# Patient Record
Sex: Female | Born: 1985
Health system: Southern US, Community
[De-identification: ages and names within clinical notes are randomized; demographics above are authoritative.]

## PROBLEM LIST (undated history)

## (undated) DIAGNOSIS — Z87442 Personal history of urinary calculi: Secondary | ICD-10-CM

## (undated) DIAGNOSIS — F419 Anxiety disorder, unspecified: Secondary | ICD-10-CM

## (undated) DIAGNOSIS — T7840XA Allergy, unspecified, initial encounter: Secondary | ICD-10-CM

## (undated) DIAGNOSIS — N289 Disorder of kidney and ureter, unspecified: Secondary | ICD-10-CM

## (undated) DIAGNOSIS — J189 Pneumonia, unspecified organism: Secondary | ICD-10-CM

## (undated) DIAGNOSIS — R519 Headache, unspecified: Secondary | ICD-10-CM

## (undated) DIAGNOSIS — F32A Depression, unspecified: Secondary | ICD-10-CM

## (undated) DIAGNOSIS — E669 Obesity, unspecified: Secondary | ICD-10-CM

## (undated) DIAGNOSIS — F3181 Bipolar II disorder: Secondary | ICD-10-CM

## (undated) DIAGNOSIS — R55 Syncope and collapse: Secondary | ICD-10-CM

## (undated) DIAGNOSIS — F329 Major depressive disorder, single episode, unspecified: Secondary | ICD-10-CM

## (undated) DIAGNOSIS — G709 Myoneural disorder, unspecified: Secondary | ICD-10-CM

## (undated) DIAGNOSIS — N200 Calculus of kidney: Secondary | ICD-10-CM

## (undated) DIAGNOSIS — J45909 Unspecified asthma, uncomplicated: Secondary | ICD-10-CM

## (undated) DIAGNOSIS — F909 Attention-deficit hyperactivity disorder, unspecified type: Secondary | ICD-10-CM

## (undated) DIAGNOSIS — K589 Irritable bowel syndrome without diarrhea: Secondary | ICD-10-CM

## (undated) DIAGNOSIS — K219 Gastro-esophageal reflux disease without esophagitis: Secondary | ICD-10-CM

## (undated) HISTORY — PX: WISDOM TOOTH EXTRACTION: SHX21

## (undated) HISTORY — DX: Unspecified asthma, uncomplicated: J45.909

## (undated) HISTORY — DX: Obesity, unspecified: E66.9

## (undated) HISTORY — DX: Syncope and collapse: R55

## (undated) HISTORY — DX: Irritable bowel syndrome, unspecified: K58.9

## (undated) HISTORY — DX: Allergy, unspecified, initial encounter: T78.40XA

## (undated) HISTORY — DX: Myoneural disorder, unspecified: G70.9

## (undated) HISTORY — DX: Calculus of kidney: N20.0

## (undated) HISTORY — DX: Bipolar II disorder: F31.81

---
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
## 2005-04-28 ENCOUNTER — Other Ambulatory Visit: Admission: RE | Admit: 2005-04-28 | Discharge: 2005-04-28 | Payer: Self-pay | Admitting: Obstetrics and Gynecology

## 2005-11-02 ENCOUNTER — Inpatient Hospital Stay (HOSPITAL_COMMUNITY): Admission: AD | Admit: 2005-11-02 | Discharge: 2005-11-03 | Payer: Self-pay | Admitting: Obstetrics and Gynecology

## 2016-11-17 ENCOUNTER — Emergency Department (HOSPITAL_COMMUNITY): Payer: BLUE CROSS/BLUE SHIELD

## 2016-11-17 ENCOUNTER — Encounter (HOSPITAL_COMMUNITY): Payer: Self-pay | Admitting: Emergency Medicine

## 2016-11-17 ENCOUNTER — Emergency Department (HOSPITAL_COMMUNITY)
Admission: EM | Admit: 2016-11-17 | Discharge: 2016-11-17 | Disposition: A | Discharge status: Home / Self Care | Payer: BLUE CROSS/BLUE SHIELD | Attending: Emergency Medicine | Admitting: Emergency Medicine

## 2016-11-17 DIAGNOSIS — N200 Calculus of kidney: Secondary | ICD-10-CM | POA: Diagnosis not present

## 2016-11-17 DIAGNOSIS — Z79899 Other long term (current) drug therapy: Secondary | ICD-10-CM | POA: Diagnosis not present

## 2016-11-17 DIAGNOSIS — M545 Low back pain: Secondary | ICD-10-CM | POA: Diagnosis present

## 2016-11-17 LAB — URINALYSIS, ROUTINE W REFLEX MICROSCOPIC
BILIRUBIN URINE: NEGATIVE
GLUCOSE, UA: NEGATIVE mg/dL
KETONES UR: 5 mg/dL — AB
Nitrite: NEGATIVE
PH: 5 (ref 5.0–8.0)
Protein, ur: NEGATIVE mg/dL
SPECIFIC GRAVITY, URINE: 1.019 (ref 1.005–1.030)

## 2016-11-17 LAB — WET PREP, GENITAL
CLUE CELLS WET PREP: NONE SEEN
Sperm: NONE SEEN
TRICH WET PREP: NONE SEEN
YEAST WET PREP: NONE SEEN

## 2016-11-17 LAB — RPR: RPR Ser Ql: NONREACTIVE

## 2016-11-17 LAB — I-STAT BETA HCG BLOOD, ED (MC, WL, AP ONLY)

## 2016-11-17 LAB — GC/CHLAMYDIA PROBE AMP (~~LOC~~) NOT AT ARMC
CHLAMYDIA, DNA PROBE: NEGATIVE
NEISSERIA GONORRHEA: NEGATIVE

## 2016-11-17 LAB — HIV ANTIBODY (ROUTINE TESTING W REFLEX): HIV Screen 4th Generation wRfx: NONREACTIVE

## 2016-11-17 IMAGING — CT CT RENAL STONE PROTOCOL
2 of 4 series · 17 of 46 positions shown, 19 images · non-contrast
Comparison: None.

CLINICAL DATA: Right flank pain for several days. Recent UTI
treated with antibiotics.

EXAM:
CT ABDOMEN AND PELVIS WITHOUT CONTRAST
TECHNIQUE: Multidetector CT imaging of the abdomen and pelvis was performed
following the standard protocol without IV contrast.

[Series 3: renal stone 5.0 · axial · 0.75mm/px · z∈[+928,+1348]mm · 14 of 92 slices shown, 16 images]
[im 4/92  soft-tissue]
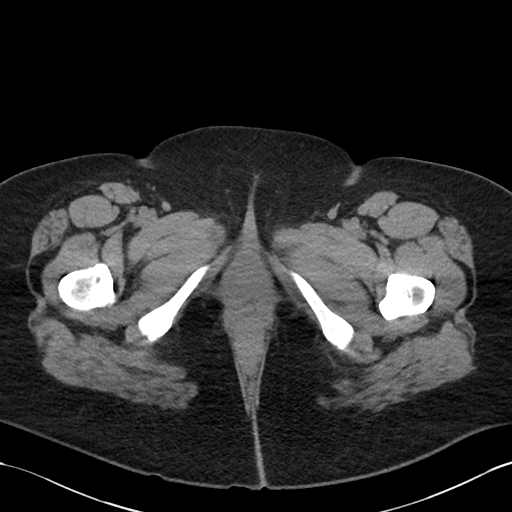
[im 4/92  bone]
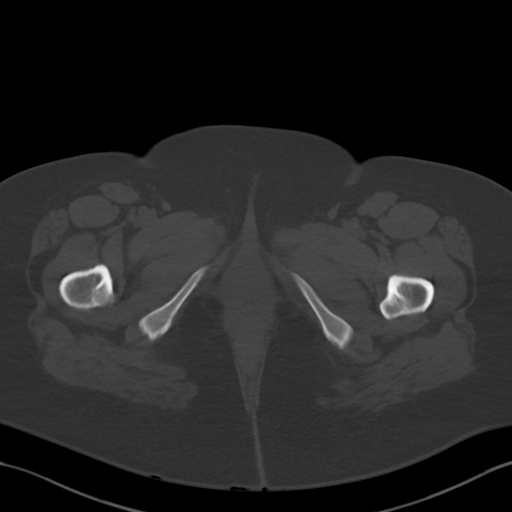
[im 12/92  soft-tissue]
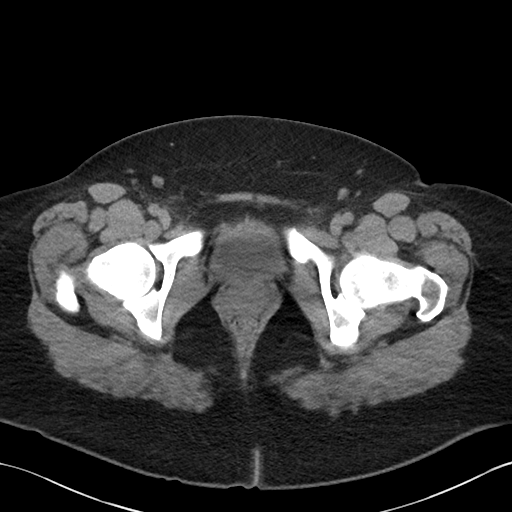
[im 19/92  soft-tissue]
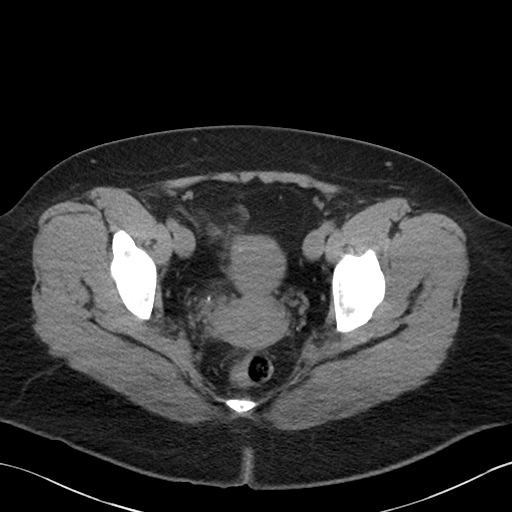
[im 23/92  soft-tissue]
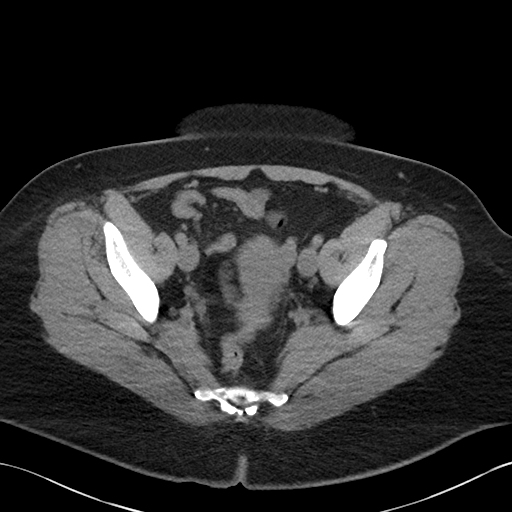
[im 31/92  soft-tissue]
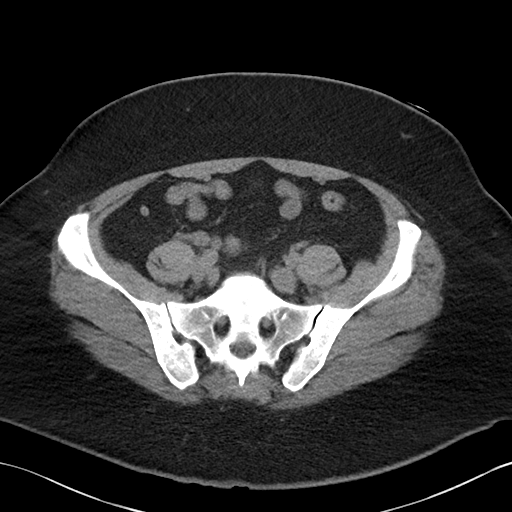
[im 38/92  soft-tissue]
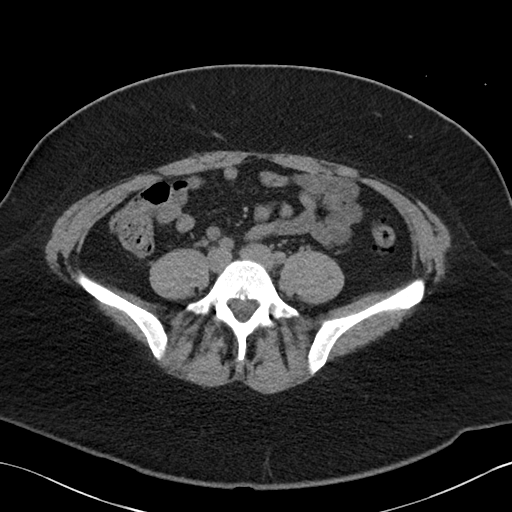
[im 42/92  soft-tissue]
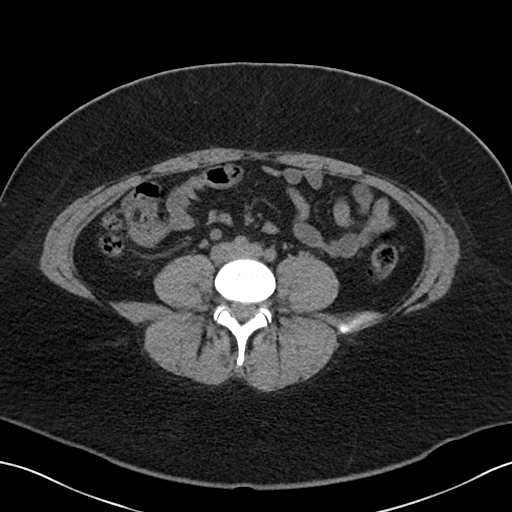
[im 50/92  soft-tissue]
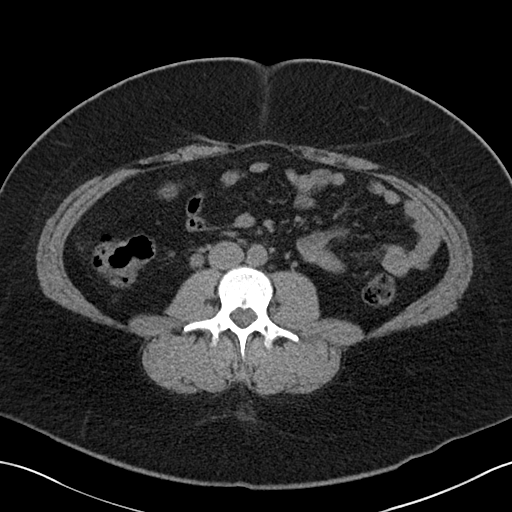
[im 54/92  soft-tissue]
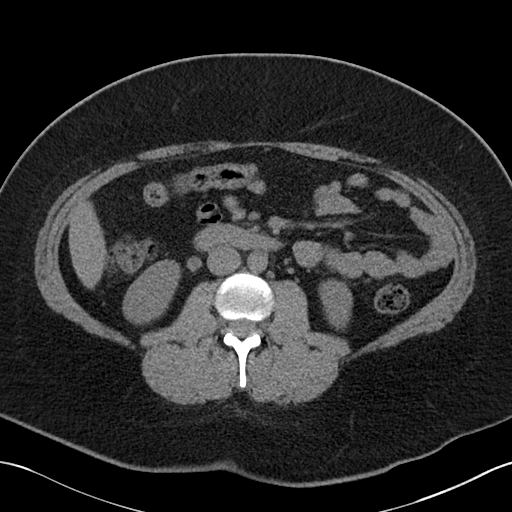
[im 54/92  bone]
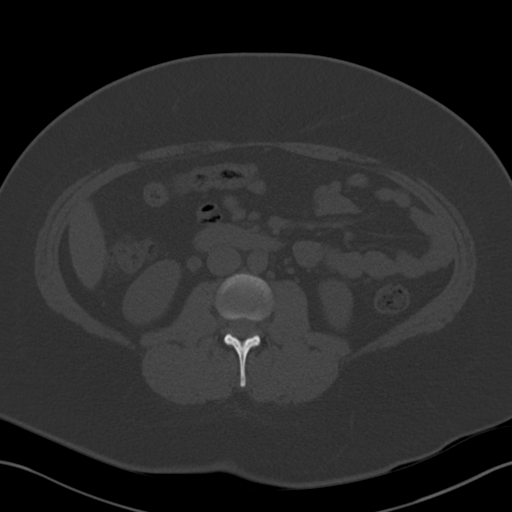
[im 61/92  soft-tissue]
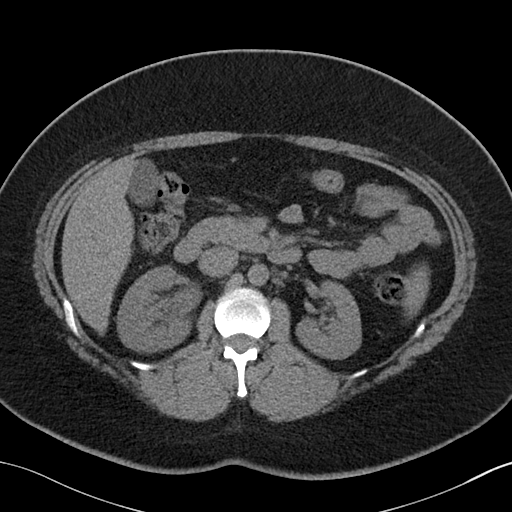
[im 69/92  soft-tissue]
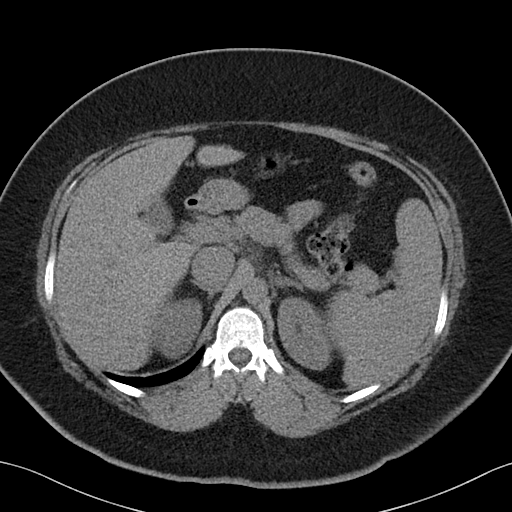
[im 73/92  soft-tissue]
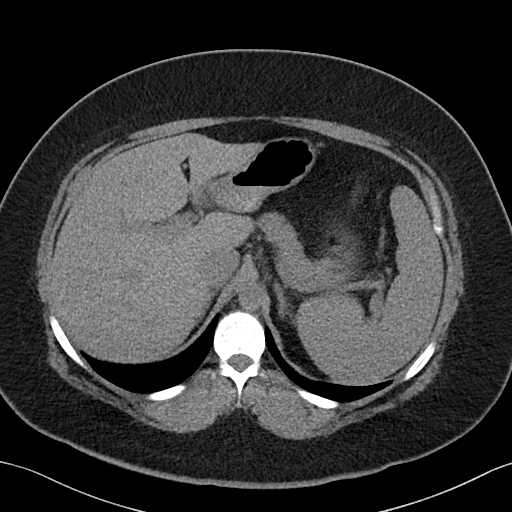
[im 80/92  soft-tissue]
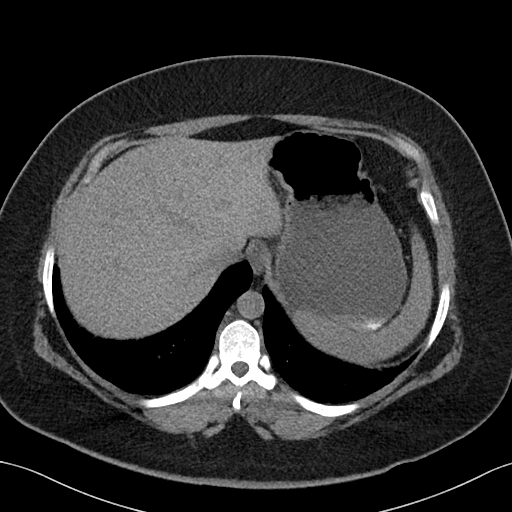
[im 88/92  soft-tissue]
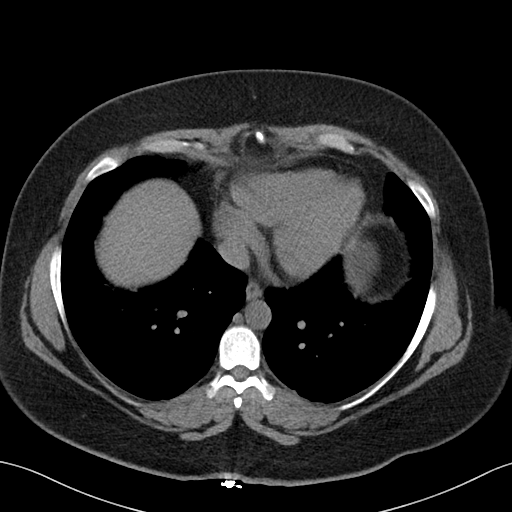

[Series 7: renal stone 3.0 cor · coronal · 0.84mm/px · 3 of 101 slices shown]
[im 34/101  soft-tissue]
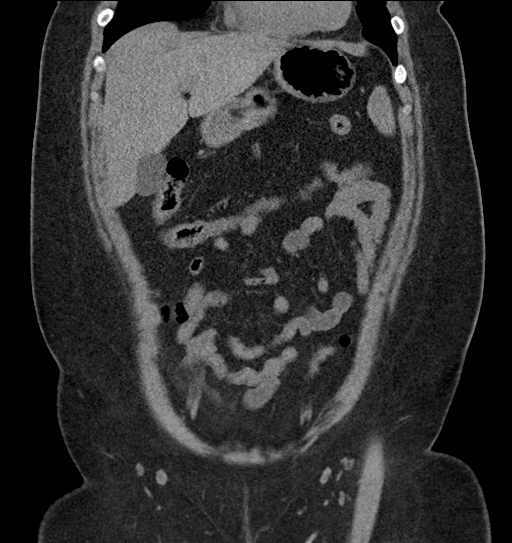
[im 45/101  soft-tissue]
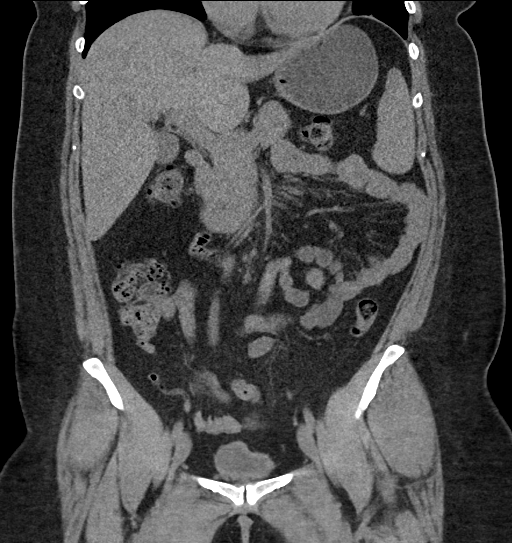
[im 56/101  soft-tissue]
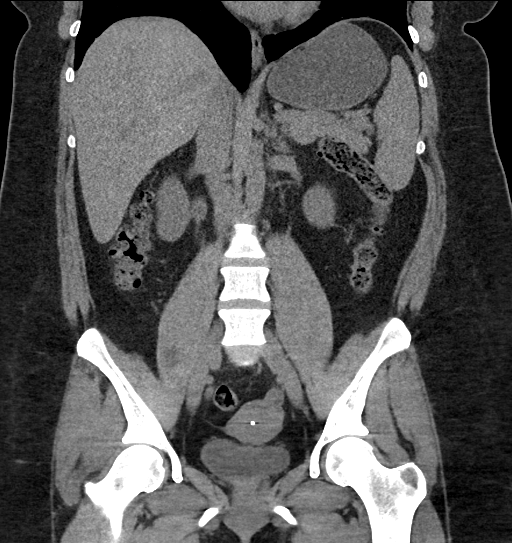

[17 of 46 positions shown; findings below may reference images not displayed]

FINDINGS: Lower chest:  Negative

Hepatobiliary: No focal liver abnormality.No evidence of biliary
obstruction or stone.

Pancreas: Unremarkable.

Spleen: Unremarkable.

Adrenals/Urinary Tract:  Negative adrenals.

5 x 4 mm stone at the right ureteral vesicular junction with
moderate hydroureteronephrosis. Mild right renal expansion and
low-density cortex. No additional urolithiasis. No left
hydronephrosis.

Unremarkable bladder.

Stomach/Bowel:  No obstruction. No appendicitis.

Vascular/Lymphatic: No acute vascular abnormality. No mass or
adenopathy.

Reproductive:No pathologic findings.  IUD in good position.

Other: No ascites or pneumoperitoneum.

Musculoskeletal: No acute abnormalities.
IMPRESSION: 5 x 4 mm right UVJ calculus with moderate hydroureteronephrosis.

## 2016-11-17 MED ORDER — MORPHINE SULFATE (PF) 4 MG/ML IV SOLN
4.0000 mg | Freq: Once | INTRAVENOUS | Status: AC
  Administered 2016-11-17: 4 mg via INTRAVENOUS
  Filled 2016-11-17: qty 1

## 2016-11-17 MED ORDER — OXYCODONE HCL 5 MG PO TABS
5.0000 mg | ORAL_TABLET | Freq: Once | ORAL | Status: AC
  Administered 2016-11-17: 5 mg via ORAL
  Filled 2016-11-17: qty 1

## 2016-11-17 MED ORDER — SODIUM CHLORIDE 0.9 % IV BOLUS (SEPSIS)
1000.0000 mL | Freq: Once | INTRAVENOUS | Status: AC
  Administered 2016-11-17: 1000 mL via INTRAVENOUS

## 2016-11-17 MED ORDER — DEXTROSE 5 % IV SOLN
1.0000 g | Freq: Once | INTRAVENOUS | Status: AC
  Administered 2016-11-17: 1 g via INTRAVENOUS
  Filled 2016-11-17: qty 10

## 2016-11-17 MED ORDER — ONDANSETRON HCL 4 MG/2ML IJ SOLN
4.0000 mg | Freq: Once | INTRAMUSCULAR | Status: AC
  Administered 2016-11-17: 4 mg via INTRAVENOUS
  Filled 2016-11-17: qty 2

## 2016-11-17 MED ORDER — TAMSULOSIN HCL 0.4 MG PO CAPS: 0.4000 mg | ORAL_CAPSULE | Freq: Every day | ORAL | 0 refills | Status: DC

## 2016-11-17 MED ORDER — MORPHINE SULFATE 15 MG PO TABS: 15.0000 mg | ORAL_TABLET | ORAL | 0 refills | Status: DC | PRN

## 2016-11-17 MED ORDER — MORPHINE SULFATE (PF) 4 MG/ML IV SOLN
8.0000 mg | Freq: Once | INTRAVENOUS | Status: AC
  Administered 2016-11-17: 8 mg via INTRAVENOUS
  Filled 2016-11-17: qty 2

## 2016-11-17 MED ORDER — AZITHROMYCIN 250 MG PO TABS
1000.0000 mg | ORAL_TABLET | Freq: Once | ORAL | Status: AC
  Administered 2016-11-17: 1000 mg via ORAL
  Filled 2016-11-17: qty 4

## 2016-11-17 MED ORDER — ACETAMINOPHEN 500 MG PO TABS
1000.0000 mg | ORAL_TABLET | Freq: Once | ORAL | Status: AC
  Administered 2016-11-17: 1000 mg via ORAL
  Filled 2016-11-17: qty 2

## 2016-11-17 MED ORDER — KETOROLAC TROMETHAMINE 30 MG/ML IJ SOLN
30.0000 mg | Freq: Once | INTRAMUSCULAR | Status: AC
  Administered 2016-11-17: 30 mg via INTRAVENOUS
  Filled 2016-11-17: qty 1

## 2016-11-17 MED ORDER — ONDANSETRON 4 MG PO TBDP: 4.0000 mg | ORAL_TABLET | Freq: Three times a day (TID) | ORAL | 0 refills | Status: DC | PRN

## 2016-11-17 NOTE — ED Triage Notes (Signed)
Pt here from home with UTI , pt has been antibiotics for a week but has not got better . Pt has pain radiating to her back

## 2016-11-17 NOTE — Discharge Instructions (Signed)

## 2016-11-17 NOTE — ED Notes (Signed)
Patient transported to CT 

## 2016-11-17 NOTE — ED Provider Notes (Signed)
[facility]-EMERGENCY DEPT Provider Note   CSN: [id number] Arrival date & time: 11/17/16  4098     History   Chief Complaint Chief Complaint  Patient presents with  . Recurrent UTI    HPI [name] [name] is a 31 y.o. female.  31 yo F with a chief complaint of urinary tract like symptoms. Patient having some hesitancy and urgency. Going on for the past week. Started on an unknown antibiotic about a week ago. Patient having no improvement to this point. Now having some right lower back pain with it as well. Denies fevers denies nausea. Denies abdominal pain. Having some vaginal bleeding denies discharge.   The history is provided by the patient.  Illness  This is a recurrent problem. The current episode started more than 1 week ago. The problem occurs constantly. The problem has not changed since onset.Pertinent negatives include no chest pain, no headaches and no shortness of breath. Nothing aggravates the symptoms. Nothing relieves the symptoms. She has tried nothing for the symptoms. The treatment provided no relief.    History reviewed. No pertinent past medical history.  There are no active problems to display for this patient.   History reviewed. No pertinent surgical history.  OB History    No data available       Home Medications    Prior to Admission medications   Medication Sig Start Date End Date Taking? Authorizing Provider  cholecalciferol (VITAMIN D) 1000 units tablet Take 1,000 Units by mouth at bedtime.   Yes Historical Provider, MD  clonazePAM (KLONOPIN) 0.5 MG tablet Take 0.5 mg by mouth 2 (two) times daily as needed for anxiety.   Yes Historical Provider, MD  cyanocobalamin 500 MCG tablet Take 500 mcg by mouth at bedtime.   Yes Historical Provider, MD  escitalopram (LEXAPRO) 5 MG tablet Take 5 mg by mouth at bedtime.    Yes Historical Provider, MD  fluticasone (FLONASE) 50 MCG/ACT nasal spray Place 1 spray into both nostrils at bedtime.   Yes Historical  Provider, MD  levonorgestrel (MIRENA) 20 MCG/24HR IUD 1 each by Intrauterine route once.   Yes Historical Provider, MD  loratadine (CLARITIN) 10 MG tablet Take 10 mg by mouth at bedtime.   Yes Historical Provider, MD  morphine (MSIR) 15 MG tablet Take 1 tablet (15 mg total) by mouth every 4 (four) hours as needed for severe pain. 11/17/16   Melene Plan, DO  ondansetron (ZOFRAN ODT) 4 MG disintegrating tablet Take 1 tablet (4 mg total) by mouth every 8 (eight) hours as needed for nausea or vomiting. 11/17/16   Melene Plan, DO  tamsulosin (FLOMAX) 0.4 MG CAPS capsule Take 1 capsule (0.4 mg total) by mouth daily after supper. 11/17/16   Melene Plan, DO    Family History History reviewed. No pertinent family history.  Social History Social History  Substance Use Topics  . Smoking status: Never Smoker  . Smokeless tobacco: Never Used  . Alcohol use Yes     Comment: social      Allergies   Wellbutrin [bupropion]   Review of Systems Review of Systems  Constitutional: Negative for chills and fever.  HENT: Negative for congestion and rhinorrhea.   Eyes: Negative for redness and visual disturbance.  Respiratory: Negative for shortness of breath and wheezing.   Cardiovascular: Negative for chest pain and palpitations.  Gastrointestinal: Negative for nausea and vomiting.  Genitourinary: Positive for frequency and urgency. Negative for dysuria.  Musculoskeletal: Negative for arthralgias and myalgias.  Skin: Negative  for pallor and wound.  Neurological: Negative for dizziness and headaches.     Physical Exam Updated Vital Signs BP 121/68   Pulse 66   Temp 97.8 F (36.6 C) (Oral)   Resp 16   Ht  (1.626 m)   Wt 265 lb (120.2 kg)   SpO2 97%   BMI 45.49 kg/m   Physical Exam  Constitutional: She is oriented to person, place, and time. She appears well-developed and well-nourished. No distress.  HENT:  Head: Normocephalic and atraumatic.  Eyes: EOM are normal. Pupils are equal,  round, and reactive to light.  Neck: Normal range of motion. Neck supple.  Cardiovascular: Normal rate and regular rhythm.  Exam reveals no gallop and no friction rub.   No murmur heard. Pulmonary/Chest: Effort normal. She has no wheezes. She has no rales.  Abdominal: Soft. She exhibits no distension and no mass. There is no tenderness. There is no guarding.  Genitourinary: Cervix exhibits motion tenderness (very mild), discharge (purulent) and friability. Right adnexum displays no mass, no tenderness and no fullness. Left adnexum displays no mass, no tenderness and no fullness.  Musculoskeletal: She exhibits no edema or tenderness.  Neurological: She is alert and oriented to person, place, and time.  Skin: Skin is warm and dry. She is not diaphoretic.  Psychiatric: She has a normal mood and affect. Her behavior is normal.  Nursing note and vitals reviewed.    ED Treatments / Results  Labs (all labs ordered are listed, but only abnormal results are displayed) Labs Reviewed  WET PREP, GENITAL - Abnormal; Notable for the following:       Result Value   WBC, Wet Prep HPF POC MANY (*)    All other components within normal limits  URINALYSIS, ROUTINE W REFLEX MICROSCOPIC - Abnormal; Notable for the following:    Color, Urine AMBER (*)    APPearance HAZY (*)    Hgb urine dipstick SMALL (*)    Ketones, ur 5 (*)    Leukocytes, UA SMALL (*)    Bacteria, UA RARE (*)    Squamous Epithelial / LPF 0-5 (*)    All other components within normal limits  URINE CULTURE  RPR  HIV ANTIBODY (ROUTINE TESTING)  I-STAT BETA HCG BLOOD, ED ([facility], [facility], [facility] ONLY)  GC/CHLAMYDIA PROBE AMP ([place]) NOT AT [facility]    EKG  EKG Interpretation None       Radiology Ct Renal Stone Study  Result Date: 11/17/2016 CLINICAL DATA:  Right flank pain for several days. Recent UTI treated with antibiotics. EXAM: CT ABDOMEN AND PELVIS WITHOUT CONTRAST TECHNIQUE: Multidetector CT imaging of the abdomen and pelvis was  performed following the standard protocol without IV contrast. COMPARISON:  None. FINDINGS: Lower chest:  Negative Hepatobiliary: No focal liver abnormality.No evidence of biliary obstruction or stone. Pancreas: Unremarkable. Spleen: Unremarkable. Adrenals/Urinary Tract:  Negative adrenals. 5 x 4 mm stone at the right ureteral vesicular junction with moderate hydroureteronephrosis. Mild right renal expansion and low-density cortex. No additional urolithiasis. No left hydronephrosis. Unremarkable bladder. Stomach/Bowel:  No obstruction. No appendicitis. Vascular/Lymphatic: No acute vascular abnormality. No mass or adenopathy. Reproductive:No pathologic findings.  IUD in good position. Other: No ascites or pneumoperitoneum. Musculoskeletal: No acute abnormalities. IMPRESSION: 5 x 4 mm right UVJ calculus with moderate hydroureteronephrosis. Electronically Signed   By: Marnee Spring M.D.   On: 11/17/2016 09:55    Procedures Procedures (including critical care time)  Medications Ordered in ED Medications  acetaminophen (TYLENOL) tablet 1,000 mg (  1,000 mg Oral Given 11/17/16 0839)  oxyCODONE (Oxy IR/ROXICODONE) immediate release tablet 5 mg (5 mg Oral Given 11/17/16 0839)  morphine 4 MG/ML injection 4 mg (4 mg Intravenous Given 11/17/16 0904)  sodium chloride 0.9 % bolus 1,000 mL (0 mLs Intravenous Stopped 11/17/16 1113)  cefTRIAXone (ROCEPHIN) 1 g in dextrose 5 % 50 mL IVPB (0 g Intravenous Stopped 11/17/16 1008)  azithromycin (ZITHROMAX) tablet 1,000 mg (1,000 mg Oral Given 11/17/16 0905)  ketorolac (TORADOL) 30 MG/ML injection 30 mg (30 mg Intravenous Given 11/17/16 1025)  morphine 4 MG/ML injection 8 mg (8 mg Intravenous Given 11/17/16 1025)  ondansetron (ZOFRAN) injection 4 mg (4 mg Intravenous Given 11/17/16 1025)     Initial Impression / Assessment and Plan / ED Course  I have reviewed the triage vital signs and the nursing notes.  Pertinent labs & imaging results that were available during my care  of the patient were reviewed by me and considered in my medical decision making (see chart for details).     32 yo F with a chief complaint of urinary tract symptoms. Going on for the past week. Recently treated with no improvement. Will likely need escalation of antibiotics will check a UA. If this is negative will likely need a pelvic as well to rule out other etiologies.  Pelvic with mild cmt.  Signs of cervicitis. Treat with abx.  The patient had sudden worsening of her symptoms over the past 10 minutes or so. Appears to be in some discomfort. Unable to reproduce with palpation. Still on the right lower back region. No radiation. We'll obtain a CT stone study to further evaluate.  CT with 5mm kidney stone at R UVJ.  Pain controlled.  D/c home.    I have discussed the diagnosis/risks/treatment options with the patient and family and believe the pt to be eligible for discharge home to follow-up with Urology. We also discussed returning to the ED immediately if new or worsening sx occur. We discussed the sx which are most concerning (e.g., sudden worsening pain, fever, inability to tolerate by mouth) that necessitate immediate return. Medications administered to the patient during their visit and any new prescriptions provided to the patient are listed below.  Medications given during this visit Medications  acetaminophen (TYLENOL) tablet 1,000 mg (1,000 mg Oral Given 11/17/16 0839)  oxyCODONE (Oxy IR/ROXICODONE) immediate release tablet 5 mg (5 mg Oral Given 11/17/16 0839)  morphine 4 MG/ML injection 4 mg (4 mg Intravenous Given 11/17/16 0904)  sodium chloride 0.9 % bolus 1,000 mL (0 mLs Intravenous Stopped 11/17/16 1113)  cefTRIAXone (ROCEPHIN) 1 g in dextrose 5 % 50 mL IVPB (0 g Intravenous Stopped 11/17/16 1008)  azithromycin (ZITHROMAX) tablet 1,000 mg (1,000 mg Oral Given 11/17/16 0905)  ketorolac (TORADOL) 30 MG/ML injection 30 mg (30 mg Intravenous Given 11/17/16 1025)  morphine 4 MG/ML  injection 8 mg (8 mg Intravenous Given 11/17/16 1025)  ondansetron (ZOFRAN) injection 4 mg (4 mg Intravenous Given 11/17/16 1025)     The patient appears reasonably screen and/or stabilized for discharge and I doubt any other medical condition or other [facility] requiring further screening, evaluation, or treatment in the ED at this time prior to discharge.    Final Clinical Impressions(s) / ED Diagnoses   Final diagnoses:  Nephrolithiasis    New Prescriptions Discharge Medication List as of 11/17/2016 11:41 AM    START taking these medications   Details  morphine (MSIR) 15 MG tablet Take 1 tablet (15 mg total) by  mouth every 4 (four) hours as needed for severe pain., Starting Mon 11/17/2016, Print    ondansetron (ZOFRAN ODT) 4 MG disintegrating tablet Take 1 tablet (4 mg total) by mouth every 8 (eight) hours as needed for nausea or vomiting., Starting Mon 11/17/2016, Print    tamsulosin (FLOMAX) 0.4 MG CAPS capsule Take 1 capsule (0.4 mg total) by mouth daily after supper., Starting Mon 11/17/2016, Print         Melene Plan, DO 11/17/16 2015

## 2016-11-18 LAB — URINE CULTURE

## 2016-12-10 ENCOUNTER — Other Ambulatory Visit: Payer: Self-pay | Admitting: Urology

## 2016-12-10 ENCOUNTER — Ambulatory Visit (HOSPITAL_COMMUNITY): Payer: BLUE CROSS/BLUE SHIELD

## 2016-12-10 ENCOUNTER — Ambulatory Visit (HOSPITAL_COMMUNITY): Payer: BLUE CROSS/BLUE SHIELD | Admitting: Anesthesiology

## 2016-12-10 ENCOUNTER — Encounter (HOSPITAL_COMMUNITY): Payer: Self-pay | Admitting: *Deleted

## 2016-12-10 ENCOUNTER — Encounter (HOSPITAL_COMMUNITY)
Admission: RE | Disposition: A | Discharge status: Home / Self Care | Payer: Self-pay | Source: Ambulatory Visit | Attending: Urology

## 2016-12-10 ENCOUNTER — Ambulatory Visit (HOSPITAL_COMMUNITY)
Admission: RE | Admit: 2016-12-10 | Discharge: 2016-12-10 | Disposition: A | Discharge status: Home / Self Care | Payer: BLUE CROSS/BLUE SHIELD | Source: Ambulatory Visit | Attending: Urology | Admitting: Urology

## 2016-12-10 DIAGNOSIS — F329 Major depressive disorder, single episode, unspecified: Secondary | ICD-10-CM | POA: Insufficient documentation

## 2016-12-10 DIAGNOSIS — F419 Anxiety disorder, unspecified: Secondary | ICD-10-CM | POA: Diagnosis not present

## 2016-12-10 DIAGNOSIS — N201 Calculus of ureter: Secondary | ICD-10-CM | POA: Insufficient documentation

## 2016-12-10 DIAGNOSIS — Z6841 Body Mass Index (BMI) 40.0 and over, adult: Secondary | ICD-10-CM | POA: Insufficient documentation

## 2016-12-10 DIAGNOSIS — Z79891 Long term (current) use of opiate analgesic: Secondary | ICD-10-CM | POA: Insufficient documentation

## 2016-12-10 DIAGNOSIS — Z791 Long term (current) use of non-steroidal anti-inflammatories (NSAID): Secondary | ICD-10-CM | POA: Insufficient documentation

## 2016-12-10 DIAGNOSIS — K219 Gastro-esophageal reflux disease without esophagitis: Secondary | ICD-10-CM | POA: Insufficient documentation

## 2016-12-10 HISTORY — PX: CYSTOSCOPY WITH RETROGRADE PYELOGRAM, URETEROSCOPY AND STENT PLACEMENT: SHX5789

## 2016-12-10 HISTORY — DX: Anxiety disorder, unspecified: F41.9

## 2016-12-10 HISTORY — DX: Gastro-esophageal reflux disease without esophagitis: K21.9

## 2016-12-10 HISTORY — DX: Personal history of urinary calculi: Z87.442

## 2016-12-10 HISTORY — DX: Major depressive disorder, single episode, unspecified: F32.9

## 2016-12-10 HISTORY — DX: Depression, unspecified: F32.A

## 2016-12-10 LAB — BASIC METABOLIC PANEL
Anion gap: 8 (ref 5–15)
BUN: 14 mg/dL (ref 6–20)
CHLORIDE: 106 mmol/L (ref 101–111)
CO2: 24 mmol/L (ref 22–32)
Calcium: 9.1 mg/dL (ref 8.9–10.3)
Creatinine, Ser: 0.97 mg/dL (ref 0.44–1.00)
GFR calc Af Amer: >60 mL/min (ref >60)
GFR calc non Af Amer: >60 mL/min (ref >60)
Glucose, Bld: 93 mg/dL (ref 65–99)
POTASSIUM: 3.8 mmol/L (ref 3.5–5.1)
SODIUM: 138 mmol/L (ref 135–145)

## 2016-12-10 LAB — HCG, SERUM, QUALITATIVE: Preg, Serum: NEGATIVE

## 2016-12-10 SURGERY — CYSTOURETEROSCOPY, WITH RETROGRADE PYELOGRAM AND STENT INSERTION
Anesthesia: General | Site: Ureter | Laterality: Right

## 2016-12-10 MED ORDER — ONDANSETRON HCL 4 MG/2ML IJ SOLN
INTRAMUSCULAR | Status: AC
  Filled 2016-12-10: qty 2

## 2016-12-10 MED ORDER — HYDROCODONE-ACETAMINOPHEN 5-325 MG PO TABS: 1.0000 | ORAL_TABLET | Freq: Four times a day (QID) | ORAL | 0 refills | Status: DC | PRN

## 2016-12-10 MED ORDER — FENTANYL CITRATE (PF) 100 MCG/2ML IJ SOLN
INTRAMUSCULAR | Status: AC
  Filled 2016-12-10: qty 2

## 2016-12-10 MED ORDER — LACTATED RINGERS IV SOLN: INTRAVENOUS | Status: DC

## 2016-12-10 MED ORDER — PHENAZOPYRIDINE HCL 100 MG PO TABS: 100.0000 mg | ORAL_TABLET | Freq: Three times a day (TID) | ORAL | 0 refills | Status: DC | PRN

## 2016-12-10 MED ORDER — LIDOCAINE 2% (20 MG/ML) 5 ML SYRINGE
INTRAMUSCULAR | Status: DC | PRN
  Administered 2016-12-10: 100 mg via INTRAVENOUS

## 2016-12-10 MED ORDER — MEPERIDINE HCL 50 MG/ML IJ SOLN: 6.2500 mg | INTRAMUSCULAR | Status: DC | PRN

## 2016-12-10 MED ORDER — MIDAZOLAM HCL 2 MG/2ML IJ SOLN
INTRAMUSCULAR | Status: AC
  Filled 2016-12-10: qty 2

## 2016-12-10 MED ORDER — FENTANYL CITRATE (PF) 100 MCG/2ML IJ SOLN
INTRAMUSCULAR | Status: DC | PRN
  Administered 2016-12-10: 50 ug via INTRAVENOUS

## 2016-12-10 MED ORDER — SODIUM CHLORIDE 0.9 % IR SOLN
Status: DC | PRN
  Administered 2016-12-10: 4000 mL

## 2016-12-10 MED ORDER — LIDOCAINE 2% (20 MG/ML) 5 ML SYRINGE
INTRAMUSCULAR | Status: AC
  Filled 2016-12-10: qty 5

## 2016-12-10 MED ORDER — SODIUM CHLORIDE 0.9 % IR SOLN
Status: DC | PRN
  Administered 2016-12-10: 1000 mL

## 2016-12-10 MED ORDER — KETOROLAC TROMETHAMINE 30 MG/ML IJ SOLN
INTRAMUSCULAR | Status: AC
  Filled 2016-12-10: qty 1

## 2016-12-10 MED ORDER — CEFAZOLIN SODIUM-DEXTROSE 2-4 GM/100ML-% IV SOLN
2.0000 g | INTRAVENOUS | Status: AC
  Administered 2016-12-10: 2 g via INTRAVENOUS
  Filled 2016-12-10: qty 100

## 2016-12-10 MED ORDER — ONDANSETRON HCL 4 MG/2ML IJ SOLN
INTRAMUSCULAR | Status: DC | PRN
  Administered 2016-12-10: 4 mg via INTRAVENOUS

## 2016-12-10 MED ORDER — IOHEXOL 300 MG/ML  SOLN
INTRAMUSCULAR | Status: DC | PRN
  Administered 2016-12-10: 3 mL

## 2016-12-10 MED ORDER — GLYCOPYRROLATE 0.2 MG/ML IJ SOLN
INTRAMUSCULAR | Status: DC | PRN
  Administered 2016-12-10: 0.2 mg via INTRAVENOUS

## 2016-12-10 MED ORDER — DEXAMETHASONE SODIUM PHOSPHATE 10 MG/ML IJ SOLN
INTRAMUSCULAR | Status: AC
  Filled 2016-12-10: qty 1

## 2016-12-10 MED ORDER — OXYCODONE HCL 5 MG/5ML PO SOLN
5.0000 mg | Freq: Once | ORAL | Status: DC | PRN
  Filled 2016-12-10: qty 5

## 2016-12-10 MED ORDER — OXYCODONE HCL 5 MG PO TABS: 5.0000 mg | ORAL_TABLET | Freq: Once | ORAL | Status: DC | PRN

## 2016-12-10 MED ORDER — PROMETHAZINE HCL 25 MG/ML IJ SOLN: 6.2500 mg | INTRAMUSCULAR | Status: DC | PRN

## 2016-12-10 MED ORDER — MIDAZOLAM HCL 5 MG/5ML IJ SOLN
INTRAMUSCULAR | Status: DC | PRN
  Administered 2016-12-10: 2 mg via INTRAVENOUS

## 2016-12-10 MED ORDER — HYDROMORPHONE HCL 1 MG/ML IJ SOLN: 0.2500 mg | INTRAMUSCULAR | Status: DC | PRN

## 2016-12-10 MED ORDER — LACTATED RINGERS IV SOLN
INTRAVENOUS | Status: DC
  Administered 2016-12-10: 14:00:00 via INTRAVENOUS

## 2016-12-10 MED ORDER — KETOROLAC TROMETHAMINE 30 MG/ML IJ SOLN
INTRAMUSCULAR | Status: DC | PRN
  Administered 2016-12-10: 30 mg via INTRAVENOUS

## 2016-12-10 MED ORDER — PROPOFOL 10 MG/ML IV BOLUS
INTRAVENOUS | Status: DC | PRN
  Administered 2016-12-10: 200 mg via INTRAVENOUS

## 2016-12-10 MED ORDER — PROPOFOL 10 MG/ML IV BOLUS
INTRAVENOUS | Status: AC
  Filled 2016-12-10: qty 20

## 2016-12-10 MED ORDER — DEXAMETHASONE SODIUM PHOSPHATE 10 MG/ML IJ SOLN
INTRAMUSCULAR | Status: DC | PRN
  Administered 2016-12-10: 10 mg via INTRAVENOUS

## 2016-12-10 SURGICAL SUPPLY — 21 items
BAG URO CATCHER STRL LF (MISCELLANEOUS) ×3 IMPLANT
CATH INTERMIT  6FR 70CM (CATHETERS) IMPLANT
CLOTH BEACON ORANGE TIMEOUT ST (SAFETY) ×3 IMPLANT
COVER SURGICAL LIGHT HANDLE (MISCELLANEOUS) ×3 IMPLANT
EXTRACTOR STONE NITINOL NGAGE (UROLOGICAL SUPPLIES) ×3 IMPLANT
FIBER LASER FLEXIVA 365 (UROLOGICAL SUPPLIES) IMPLANT
FIBER LASER TRAC TIP (UROLOGICAL SUPPLIES) IMPLANT
GLOVE BIOGEL M STRL SZ7.5 (GLOVE) ×3 IMPLANT
GLOVE BIOGEL PI IND STRL 7.5 (GLOVE) ×1 IMPLANT
GLOVE BIOGEL PI INDICATOR 7.5 (GLOVE) ×2
GLOVE SURG SS PI 7.5 STRL IVOR (GLOVE) ×3 IMPLANT
GOWN STRL REUS W/ TWL XL LVL3 (GOWN DISPOSABLE) ×1 IMPLANT
GOWN STRL REUS W/TWL LRG LVL3 (GOWN DISPOSABLE) ×3 IMPLANT
GOWN STRL REUS W/TWL XL LVL3 (GOWN DISPOSABLE) ×2
GUIDEWIRE ANG ZIPWIRE 038X150 (WIRE) ×3 IMPLANT
GUIDEWIRE STR DUAL SENSOR (WIRE) ×3 IMPLANT
MANIFOLD NEPTUNE II (INSTRUMENTS) ×3 IMPLANT
PACK CYSTO (CUSTOM PROCEDURE TRAY) ×3 IMPLANT
SHEATH ACCESS URETERAL 38CM (SHEATH) IMPLANT
TUBING CONNECTING 10 (TUBING) ×2 IMPLANT
TUBING CONNECTING 10' (TUBING) ×1

## 2016-12-10 NOTE — Anesthesia Procedure Notes (Signed)
Procedure Name: LMA Insertion Date/Time: 12/10/2016 4:02 PM Performed by: Leroy LibmanEARDON, [name] Siegman L Patient Re-evaluated:Patient Re-evaluated prior to inductionOxygen Delivery Method: Circle system utilized Preoxygenation: Pre-oxygenation with 100% oxygen Intubation Type: IV induction Ventilation: Mask ventilation without difficulty LMA: LMA inserted LMA Size: 4.0 Number of attempts: 1 Placement Confirmation: positive ETCO2 and breath sounds checked- equal and bilateral Tube secured with: Tape Dental Injury: Teeth and Oropharynx as per pre-operative assessment

## 2016-12-10 NOTE — Discharge Instructions (Addendum)
°. °  General Anesthesia, Adult, Care After °These instructions provide you with information about caring for yourself after your procedure. Your health care provider may also give you more specific instructions. Your treatment has been planned according to current medical practices, but problems sometimes occur. Call your health care provider if you have any problems or questions after your procedure. °What can I expect after the procedure? °After the procedure, it is common to have: °· Vomiting. °· A sore throat. °· Mental slowness. °It is common to feel: °· Nauseous. °· Cold or shivery. °· Sleepy. °· Tired. °· Sore or achy, even in parts of your body where you did not have surgery. °Follow these instructions at home: °For at least 24 hours after the procedure:  °· Do not: °¨ Participate in activities where you could fall or become injured. °¨ Drive. °¨ Use heavy machinery. °¨ Drink alcohol. °¨ Take sleeping pills or medicines that cause drowsiness. °¨ Make important decisions or sign legal documents. °¨ Take care of children on your own. °· Rest. °Eating and drinking  °· If you vomit, drink water, juice, or soup when you can drink without vomiting. °· Drink enough fluid to keep your urine clear or pale yellow. °· Make sure you have little or no nausea before eating solid foods. °· Follow the diet recommended by your health care provider. °General instructions  °· Have a responsible adult stay with you until you are awake and alert. °· Return to your normal activities as told by your health care provider. Ask your health care provider what activities are safe for you. °· Take over-the-counter and prescription medicines only as told by your health care provider. °· If you smoke, do not smoke without supervision. °· Keep all follow-up visits as told by your health care provider. This is important. °Contact a health care provider if: °· You continue to have nausea or vomiting at home, and medicines are not  helpful. °· You cannot drink fluids or start eating again. °· You cannot urinate after 8-12 hours. °· You develop a skin rash. °· You have fever. °· You have increasing redness at the site of your procedure. °Get help right away if: °· You have difficulty breathing. °· You have chest pain. °· You have unexpected bleeding. °· You feel that you are having a life-threatening or urgent problem. °This information is not intended to replace advice given to you by your health care provider. Make sure you discuss any questions you have with your health care provider. °Document Released: 10/27/2000 Document Revised: 12/24/2015 Document Reviewed: 07/05/2015 °Elsevier Interactive Patient Education © 2017 [employer] ° ° ° ° ° ° °1. You may see some blood in the urine and may have some burning with urination for 48-72 hours. You also may notice that you have to urinate more frequently or urgently after your procedure which is normal.  °2. You should call should you develop an inability urinate, fever > 101, persistent nausea and vomiting that prevents you from eating or drinking to stay hydrated.  °

## 2016-12-10 NOTE — Transfer of Care (Signed)
Immediate Anesthesia Transfer of Care Note  Patient: [name] [name]  Procedure(s) Performed: Procedure(s): CYSTOSCOPY WITH RETROGRADE PYELOGRAM, URETEROSCOPY AND STONE REMOVAL (Right)  Patient Location: PACU  Anesthesia Type:General  Level of Consciousness: awake, alert  and oriented  Airway & Oxygen Therapy: Patient Spontanous Breathing and Patient connected to face mask oxygen  Post-op Assessment: Report given to RN and Post -op Vital signs reviewed and stable  Post vital signs: Reviewed and stable  Last Vitals:  Vitals:   12/10/16 1340  BP: 108/73  Pulse: 89  Resp: 16  Temp: 37.3 C    Last Pain:  Vitals:   12/10/16 1340  TempSrc: Oral  PainSc: 0-No pain      Patients Stated Pain Goal: 3 (12/10/16 1340)  Complications: No apparent anesthesia complications

## 2016-12-10 NOTE — Anesthesia Preprocedure Evaluation (Signed)
Anesthesia Evaluation  Patient identified by MRN, date of birth, ID band Patient awake    Reviewed: Allergy & Precautions, NPO status , Patient's Chart, lab work & pertinent test results  Airway Mallampati: II  TM Distance: >3 FB Neck ROM: Full    Dental no notable dental hx.    Pulmonary neg pulmonary ROS,    Pulmonary exam normal breath sounds clear to auscultation       Cardiovascular negative cardio ROS Normal cardiovascular exam Rhythm:Regular Rate:Normal     Neuro/Psych Anxiety Depression negative neurological ROS  negative psych ROS   GI/Hepatic negative GI ROS, Neg liver ROS, GERD  ,  Endo/Other  negative endocrine ROSMorbid obesity  Renal/GU negative Renal ROS  negative genitourinary   Musculoskeletal negative musculoskeletal ROS (+)   Abdominal   Peds negative pediatric ROS (+)  Hematology negative hematology ROS (+)   Anesthesia Other Findings   Reproductive/Obstetrics negative OB ROS                             Anesthesia Physical Anesthesia Plan  ASA: III  Anesthesia Plan: General   Post-op Pain Management:    Induction: Intravenous  Airway Management Planned:   Additional Equipment:   Intra-op Plan:   Post-operative Plan: Extubation in OR  Informed Consent: I have reviewed the patients History and Physical, chart, labs and discussed the procedure including the risks, benefits and alternatives for the proposed anesthesia with the patient or authorized representative who has indicated his/her understanding and acceptance.   Dental advisory given  Plan Discussed with: CRNA  Anesthesia Plan Comments:         Anesthesia Quick Evaluation

## 2016-12-10 NOTE — H&P (Signed)
Office Visit Report     12/10/2016   --------------------------------------------------------------------------------   [name] [name]  MRN: [id number]  PRIMARY CARE:  Iva Boop, MD  DOB: 03/05/1986, 31 year old Female  REFERRING:    SSN-[id number]  PROVIDER:  Heloise Purpura, M.D.    LOCATION:  [facility] [phone]   --------------------------------------------------------------------------------   CC/HPI: Right ureteral calculus   [name] is a 31 year old female seen today as a new patient for a 5 mm right ureteral calculus. She initially developed symptoms approximately 1 month ago when she developed urinary frequency and dysuria. She presented to an urgent care center and had a urinalysis performed and was told she had a urinary tract infection was treated with antibiotic therapy. It is unclear whether she had a culture performed. She then developed the acute onset of severe right-sided flank pain 1 week later and presented to the emergency room. This was associated with nausea and vomiting. A CT stone study was performed on 11/17/16 and confirmed a 5 mm right ureterovesical junction stone. She then had about a week where she had no symptoms but has continued to have recurrent pain. More recently, she has been having significant right groin pain with frequency and dysuria. She has not noted passing the stone.   She has no personal history of urolithiasis and has no family history of kidney stones. She otherwise is quite healthy with no other major medical conditions.     ALLERGIES: Wellbutrin - Hives    MEDICATIONS: Claritin  Flomax  Ibuprofen  Lexapro  Morphine Sulfate  Vitamin B12  Vitamin D3     GU PSH: None     PSH Notes: **wisdom teeth extraction**   NON-GU PSH: Dental Surgery Procedure    GU PMH: Renal calculus    NON-GU PMH: Anxiety Depression GERD    FAMILY HISTORY: None   SOCIAL HISTORY: Marital Status: Married Current Smoking Status:  Patient has never smoked.   Tobacco Use Assessment Completed: Used Tobacco in last 30 days? Does not use smokeless tobacco. Drinks 1 drink per week.  Drinks 1 caffeinated drink per day. Patient's occupation Engineering geologist.    REVIEW OF SYSTEMS:    GU Review Female:   Patient reports frequent urination, hard to postpone urination, burning /pain with urination, and get up at night to urinate. Patient denies leakage of urine, stream starts and stops, trouble starting your stream, have to strain to urinate, and currently pregnant.  Gastrointestinal (Upper):   Patient reports nausea and vomiting.   Gastrointestinal (Lower):   Patient denies diarrhea and constipation.  Constitutional:   Patient reports weight loss and fatigue. Patient denies fever and night sweats.  Skin:   Patient reports skin rash/ lesion and itching.   Eyes:   Patient denies blurred vision and double vision.  Ears/ Nose/ Throat:   Patient denies sore throat and sinus problems.  Hematologic/Lymphatic:   Patient denies swollen glands and easy bruising.  Cardiovascular:   Patient denies leg swelling and chest pains.  Respiratory:   Patient denies cough and shortness of breath.  Endocrine:   Patient reports excessive thirst.   Musculoskeletal:   Patient denies back pain and joint pain.  Neurological:   Patient reports dizziness. Patient denies headaches.  Psychologic:   Patient reports depression and anxiety.    VITAL SIGNS:      12/10/2016 10:57 AM  Weight 251 lb / 113.85 kg  Height 64 in / 162.56 cm  BP 102/69 mmHg  Pulse  79 /min  Temperature 98.3 F / 37 C  BMI 43.1 kg/m   MULTI-SYSTEM PHYSICAL EXAMINATION:    Constitutional: Well-nourished. No physical deformities. Normally developed. Good grooming.  Neck: Neck symmetrical, not swollen. Normal tracheal position.  Respiratory: No labored breathing, no use of accessory muscles. Clear bilaterally  Cardiovascular: Normal temperature, normal extremity pulses, no  swelling, no varicosities. Regular rate and rhythm  Lymphatic: No enlargement of neck, axillae, groin.  Skin: No paleness, no jaundice, no cyanosis. No lesion, no ulcer, no rash.  Neurologic / Psychiatric: Oriented to time, oriented to place, oriented to person. No depression, no anxiety, no agitation.  Gastrointestinal: No mass, no tenderness, no rigidity, obese abdomen.   Eyes: Normal conjunctivae. Normal eyelids.  Ears, Nose, Mouth, and Throat: Left ear no scars, no lesions, no masses. Right ear no scars, no lesions, no masses. Nose no scars, no lesions, no masses. Normal hearing. Normal lips.  Musculoskeletal: Normal gait and station of head and neck.     PAST DATA REVIEWED:  Source Of History:  Patient  Records Review:   Previous Patient Records  Urine Test Review:   Urinalysis  X-Ray Review: KUB: Reviewed Films. I independently reviewed her KUB x-ray. Her urine pregnancy test was negative. This demonstrated a persistent calcification in the vicinity of the right UVJ consistent with her scout film from her CT scan 3 weeks ago. This is consistent with a persistent right ureteral calculus. C.T. Abdomen/Pelvis: Reviewed Films. I eventually reviewed her CT scan. This does demonstrate a 5 mm right UVJ stone with associated hydroureteronephrosis.    PROCEDURES:          Urinalysis w/Scope Dipstick Dipstick Cont'd Micro  Color: Yellow Bilirubin: Neg WBC/hpf: 0 - 5/hpf  Appearance: Clear Ketones: Neg RBC/hpf: 0 - 2/hpf  Specific Gravity: 1.020 Blood: Trace Bacteria: NS (Not Seen)  pH: 5.5 Protein: Neg Cystals: NS (Not Seen)  Glucose: Neg Urobilinogen: 0.2 Casts: NS (Not Seen)    Nitrites: Neg Trichomonas: Not Present    Leukocyte Esterase: Neg Mucous: Not Present      Epithelial Cells: NS (Not Seen)      Yeast: NS (Not Seen)      Sperm: Not Present    ASSESSMENT:      ICD-10 Details  1 GU:   Ureteral calculus - N20.1    PLAN:           Orders Labs URINE PREGNANCY TEST           Document Letter(s):  Created for Patient: Clinical Summary         Notes:   1. Right ureteral calculus: She has a persistent right ureteral stone continues to be symptomatic. I offered her options including continued medical expulsion management versus intervention. Currently, she has had recent nausea/vomiting and ongoing pain and would like to proceed with definitive therapy at this time. We reviewed options and I recommended that she consider right ureteroscopic laser lithotripsy with stone removal and possible right ureteral stent placement. We have reviewed the potential risks and complications of this procedure as well as the expected recovery process. She gives informed consent to proceed. This will be scheduled later today.   Cc: Dr. Caryn BeeKevin Via

## 2016-12-10 NOTE — Op Note (Signed)
Preoperative diagnosis: Right ureteral calculus  Postoperative diagnosis: Right ureteral calculus  Procedure:  1. Cystoscopy 2. Right ureteroscopy and stone removal 3. Right retrograde pyelography with interpretation  Surgeon: Moody BruinsLester S. Kanyla Omeara, Jr. M.D.  Anesthesia: General  Complications: None  Intraoperative findings: Right retrograde pyelography demonstrated a filling defect within the distal right ureter consistent with the patient's known calculus without other abnormalities.  EBL: Minimal  Specimens: 1. Right ureteral calculus  Disposition of specimens: [facility] for stone analysis  Indication: [name] is a 31 y.o. year old patient with urolithiasis. She has a symptomatic distal right ureteral stone. After reviewing the management options for treatment, the patient elected to proceed with the above surgical procedure(s). We have discussed the potential benefits and risks of the procedure, side effects of the proposed treatment, the likelihood of the patient achieving the goals of the procedure, and any potential problems that might occur during the procedure or recuperation. Informed consent has been obtained.  Description of procedure:  The patient was taken to the operating room and general anesthesia was induced.  The patient was placed in the dorsal lithotomy position, prepped and draped in the usual sterile fashion, and preoperative antibiotics were administered. A preoperative time-out was performed.   Cystourethroscopy was performed.  The patient's urethra was examined and was normal. The bladder was then systematically examined in its entirety. There was no evidence for any bladder tumors, stones, or other mucosal pathology.    Attention then turned to the right ureteral orifice and a ureteral catheter was used to intubate the ureteral orifice.  Omnipaque contrast was injected through the ureteral catheter and a retrograde pyelogram was  performed with findings as dictated above.  A 0.38 sensor guidewire was then advanced up the right ureter into the renal pelvis under fluoroscopic guidance. The 6 Fr semirigid ureteroscope was then advanced into the ureter next to the guidewire and the calculus was identified.   The stone was then removed intact with an N gauge basket. Reinspection of the ureter revealed no remaining visible stones or fragments.   The bladder was then emptied and the procedure ended.  The patient appeared to tolerate the procedure well and without complications.  The patient was able to be awakened and transferred to the recovery unit in satisfactory condition.

## 2016-12-10 NOTE — Progress Notes (Signed)
Office called at 1300 mesg with office to get doctor to sign orders.

## 2016-12-10 NOTE — Anesthesia Postprocedure Evaluation (Signed)
Anesthesia Post Note  Patient: [name] [name]  Procedure(s) Performed: Procedure(s) (LRB): CYSTOSCOPY WITH RETROGRADE PYELOGRAM, URETEROSCOPY AND STONE REMOVAL (Right)  Patient location during evaluation: PACU Anesthesia Type: General Level of consciousness: awake and alert Pain management: pain level controlled Vital Signs Assessment: post-procedure vital signs reviewed and stable Respiratory status: spontaneous breathing, nonlabored ventilation and respiratory function stable Cardiovascular status: blood pressure returned to baseline and stable Postop Assessment: no signs of nausea or vomiting Anesthetic complications: no       Last Vitals:  Vitals:   12/10/16 1715 12/10/16 1738  BP: 119/73 124/78  Pulse: 70 74  Resp: 16 18  Temp: 36.8 C 36.7 C    Last Pain:  Vitals:   12/10/16 1700  TempSrc:   PainSc: 0-No pain                 [name] [name]

## 2016-12-11 ENCOUNTER — Encounter (HOSPITAL_COMMUNITY): Payer: Self-pay | Admitting: Urology

## 2017-01-23 ENCOUNTER — Inpatient Hospital Stay (HOSPITAL_COMMUNITY)
Admission: AD | Admit: 2017-01-23 | Discharge: 2017-01-28 | DRG: 885 | Disposition: A | Discharge status: Home / Self Care | Payer: BLUE CROSS/BLUE SHIELD | Attending: Psychiatry | Admitting: Psychiatry

## 2017-01-23 ENCOUNTER — Encounter (HOSPITAL_COMMUNITY): Payer: Self-pay

## 2017-01-23 DIAGNOSIS — Z888 Allergy status to other drugs, medicaments and biological substances status: Secondary | ICD-10-CM | POA: Diagnosis not present

## 2017-01-23 DIAGNOSIS — F332 Major depressive disorder, recurrent severe without psychotic features: Secondary | ICD-10-CM | POA: Diagnosis not present

## 2017-01-23 DIAGNOSIS — F419 Anxiety disorder, unspecified: Secondary | ICD-10-CM | POA: Diagnosis not present

## 2017-01-23 DIAGNOSIS — R45851 Suicidal ideations: Secondary | ICD-10-CM | POA: Diagnosis not present

## 2017-01-23 DIAGNOSIS — Z79899 Other long term (current) drug therapy: Secondary | ICD-10-CM | POA: Diagnosis not present

## 2017-01-23 DIAGNOSIS — G4700 Insomnia, unspecified: Secondary | ICD-10-CM | POA: Diagnosis not present

## 2017-01-23 DIAGNOSIS — F339 Major depressive disorder, recurrent, unspecified: Secondary | ICD-10-CM | POA: Insufficient documentation

## 2017-01-23 DIAGNOSIS — F330 Major depressive disorder, recurrent, mild: Secondary | ICD-10-CM

## 2017-01-23 MED ORDER — MAGNESIUM HYDROXIDE 400 MG/5ML PO SUSP
30.0000 mL | Freq: Every day | ORAL | Status: DC | PRN
  Filled 2017-01-23: qty 30

## 2017-01-23 MED ORDER — ACETAMINOPHEN 325 MG PO TABS
650.0000 mg | ORAL_TABLET | Freq: Four times a day (QID) | ORAL | Status: DC | PRN
  Administered 2017-01-23 – 2017-01-27 (×6): 650 mg via ORAL
  Filled 2017-01-23 (×7): qty 2

## 2017-01-23 MED ORDER — TRAZODONE HCL 50 MG PO TABS
50.0000 mg | ORAL_TABLET | Freq: Every evening | ORAL | Status: DC | PRN
  Administered 2017-01-23: 50 mg via ORAL
  Filled 2017-01-23 (×2): qty 1

## 2017-01-23 MED ORDER — ALUM & MAG HYDROXIDE-SIMETH 200-200-20 MG/5ML PO SUSP
30.0000 mL | ORAL | Status: DC | PRN
  Filled 2017-01-23: qty 30

## 2017-01-23 MED ORDER — CLONAZEPAM 0.5 MG PO TABS
0.5000 mg | ORAL_TABLET | Freq: Two times a day (BID) | ORAL | Status: DC | PRN
  Administered 2017-01-23: 0.5 mg via ORAL
  Filled 2017-01-23 (×2): qty 1

## 2017-01-23 NOTE — Progress Notes (Signed)
D: Patient isolating in her room. Appear anxious. Report headache of 4/10. Requested "something to help with my  anxiety". Denies SI/HI, AH/VH at this time. No behavior issues noted.  A: Staff offered support and encouragement as needed. Due med given as ordered.  Routine safety checks maintained. Will continue to monitor patient. R: Patient cooperative on unit.

## 2017-01-23 NOTE — Progress Notes (Signed)
[name] [name] is a 31 year old female being [facility]  She came in accompanied by her husband for suicidal ideation and depression.  [facility] today stating she had plans to cut her wrists.  She is reporting crying spells and trouble sleeping.  She denies HI or A/V hallucinations.  She reported that she is having work stress and a recent separation.  She reports that she has been having trouble with back/leg pain and is going to be seeing an orthopedic specialist for her condition as it is getting worse because she has to work 2 jobs.  She denies any other medical issues at this time.  Oriented her to the unit.  Admission paperwork completed and signed.  Belongings searched and secured in locker # 19.  Skin assessment completed and noted eczema behind right ear and pubic are as well as multiple tattoos.  Q 15 minute checks initiated for safety.  We will monitor the progress towards her goals.
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
## 2017-01-23 NOTE — H&P (Signed)
Behavioral Health Medical Screening Exam  [name] [name] is an 31 y.o. female.  Total Time spent with patient: 30 minutes  Psychiatric Specialty Exam: Physical Exam  Nursing note and vitals reviewed. Constitutional: She is oriented to person, place, and time. She appears well-developed and well-nourished.  Musculoskeletal: Normal range of motion.  Neurological: She is alert and oriented to person, place, and time.  Psychiatric: She has a normal mood and affect. Her behavior is normal.    Review of Systems  Psychiatric/Behavioral: Positive for depression and suicidal ideas. The patient is nervous/anxious.     There were no vitals taken for this visit.There is no height or weight on file to calculate BMI.  General Appearance: Guarded  Eye Contact:  Fair  Speech:  Clear and Coherent  Volume:  Normal  Mood:  Anxious and Depressed  Affect:  Blunt, Depressed and Flat  Thought Process:  Linear  Orientation:  Full (Time, Place, and Person)  Thought Content:  Hallucinations: None  Suicidal Thoughts:  Yes.  with intent/plan  Homicidal Thoughts:  No  Memory:  Immediate;   Fair Recent;   Fair Remote;   Fair  Judgement:  Fair  Insight:  Fair  Psychomotor Activity:  Normal  Concentration: Concentration: Fair  Recall:  FiservFair  Fund of Knowledge:Fair  Language: Fair  Akathisia:  No  Handed:  Right  AIMS (if indicated):     Assets:  Communication Skills Desire for Improvement Social Support  Sleep:       Musculoskeletal: Strength & Muscle Tone: within normal limits Gait & Station: normal Patient leans: N/A  There were no vitals taken for this visit. B/P:129/80 hr 91 TEMP 98.8 RR 18 Recommendations:  Based on my evaluation the patient does not appear to have an emergency medical condition.  Oneta Rackanika N Lewis, NP 01/23/2017, 5:34 PM

## 2017-01-23 NOTE — Tx Team (Signed)
Initial Treatment Plan 01/23/2017 9:12 PM [name] A Rosenstock ZOX:[id number]    PATIENT STRESSORS: Financial difficulties Loss of relationship Occupational concerns   PATIENT STRENGTHS: Capable of independent living Wellsite geologistCommunication skills General fund of knowledge Motivation for treatment/growth Physical Health Supportive family/friends   PATIENT IDENTIFIED PROBLEMS: Depression  Suicidal ideation  Anxiety  "I want to control my anxiety better  "I don't want to be suicidal anymore"             DISCHARGE CRITERIA:  Improved stabilization in mood, thinking, and/or behavior Motivation to continue treatment in a less acute level of care Verbal commitment to aftercare and medication compliance  PRELIMINARY DISCHARGE PLAN: Outpatient therapy Medication management  PATIENT/FAMILY INVOLVEMENT: This treatment plan has been presented to and reviewed with the patient, [name] [name].  The patient and family have been given the opportunity to ask questions and make suggestions.  Levin BaconHeather V Cherith Tewell, RN 01/23/2017, 9:12 PM

## 2017-01-24 ENCOUNTER — Encounter (HOSPITAL_COMMUNITY): Payer: Self-pay | Admitting: Behavioral Health

## 2017-01-24 DIAGNOSIS — R45851 Suicidal ideations: Secondary | ICD-10-CM

## 2017-01-24 DIAGNOSIS — F332 Major depressive disorder, recurrent severe without psychotic features: Secondary | ICD-10-CM

## 2017-01-24 LAB — COMPREHENSIVE METABOLIC PANEL
ALT: 15 U/L (ref 14–54)
ANION GAP: 8 (ref 5–15)
AST: 19 U/L (ref 15–41)
Albumin: 3.9 g/dL (ref 3.5–5.0)
Alkaline Phosphatase: 70 U/L (ref 38–126)
BUN: 8 mg/dL (ref 6–20)
CHLORIDE: 106 mmol/L (ref 101–111)
CO2: 26 mmol/L (ref 22–32)
Calcium: 9 mg/dL (ref 8.9–10.3)
Creatinine, Ser: 0.76 mg/dL (ref 0.44–1.00)
GFR calc non Af Amer: >60 mL/min (ref >60)
Glucose, Bld: 103 mg/dL — ABNORMAL HIGH (ref 65–99)
Potassium: 3.6 mmol/L (ref 3.5–5.1)
SODIUM: 140 mmol/L (ref 135–145)
Total Bilirubin: 0.7 mg/dL (ref 0.3–1.2)
Total Protein: 6.6 g/dL (ref 6.5–8.1)

## 2017-01-24 LAB — LIPID PANEL
CHOL/HDL RATIO: 4.1 ratio
CHOLESTEROL: 175 mg/dL (ref 0–200)
HDL: 43 mg/dL (ref >40)
LDL Cholesterol: 98 mg/dL (ref 0–99)
TRIGLYCERIDES: 171 mg/dL — AB (ref <150)
VLDL: 34 mg/dL (ref 0–40)

## 2017-01-24 LAB — CBC
HCT: 39.8 % (ref 36.0–46.0)
Hemoglobin: 13.7 g/dL (ref 12.0–15.0)
MCH: 31 pg (ref 26.0–34.0)
MCHC: 34.4 g/dL (ref 30.0–36.0)
MCV: 90 fL (ref 78.0–100.0)
PLATELETS: 298 10*3/uL (ref 150–400)
RBC: 4.42 MIL/uL (ref 3.87–5.11)
RDW: 13.4 % (ref 11.5–15.5)
WBC: 6.5 10*3/uL (ref 4.0–10.5)

## 2017-01-24 LAB — TSH: TSH: 1.508 u[IU]/mL (ref 0.350–4.500)

## 2017-01-24 MED ORDER — ESCITALOPRAM OXALATE 20 MG PO TABS
20.0000 mg | ORAL_TABLET | Freq: Every day | ORAL | Status: DC
  Administered 2017-01-24 – 2017-01-27 (×4): 20 mg via ORAL
  Filled 2017-01-24 (×4): qty 1
  Filled 2017-01-24: qty 2
  Filled 2017-01-24 (×2): qty 1

## 2017-01-24 NOTE — [facility] Group Notes (Signed)
Adult Therapy Group Note (Clinical Social Work)  Date:  01/24/2017  Time:  11:00AM-12:00PM  Group Topic/Focus:  HEALTHY COPING SKILLS  Today's group focused on identifying healthy coping skills already in use by each patient, as well as healthy coping skills they would like to learn.  There was much sharing, support, psychoeducation, and encouragement provided.  Participation Level:  Active  Participation Quality:  Attentive and Sharing  Affect:  Appropriate  Cognitive:  Appropriate  Insight: Good  Engagement in Group:  Engaged  Modes of Intervention:  Discussion, Support and Processing  Additional Comments:  The patient shared that healthy coping techniques already used are sleeping when she is upset to just regroup for a little bit, recognizing her triggers, and reaching out to her husband.  She would like to grow her support system so that she has other people she can also reach out to.  Carloyn JaegerMareida J Grossman-Orr 01/03/2017, 1:28 PM

## 2017-01-24 NOTE — [facility] Suicide Risk Assessment (Signed)
[facility] Suicide Risk [facility]   Nursing information obtained from:  Patient Demographic factors:  Caucasian Current Mental Status:  Suicidal ideation indicated by patient Loss Factors:  Financial problems / change in socioeconomic status Historical Factors:  NA Risk Reduction Factors:  Living with another person, especially a relative  Total Time spent with patient: 45 minutes Principal Problem: MDD (major depressive disorder), recurrent severe, without psychosis (HCC) Diagnosis:   Patient Active Problem List   Diagnosis Date Noted  . MDD (major depressive disorder), recurrent severe, without psychosis (HCC) [F33.2] 01/24/2017  . Suicidal ideation [R45.851] 01/24/2017  . MDD (major depressive disorder), recurrent episode (HCC) [F33.9] 01/23/2017   Subjective Data:  31 yo Caucasian female married, lives with her husband and their pets. Employed. Background history of childhood trauma and depression. Presented in company of her husband. Reports worsening depression and thoughts of suicide. Had superficial lacerations on her left wrist. Has been under a lot of stress lately. Was separated from her husband for six weeks. Financial constraints was a major factor. Picked up another job so as to make ends meet. Has awkward schedules from both job. Says it is affecting her sleep wake cycle. She is not able to do anything other than work and rest. Says she has been getting more withdrawn. No interest to walk the dog. This has increased the tension in their marriage. Feels her husband does not understand what she is going through. Says she has also been getting a lot of stress from one of her job. Today is her birthday. Says she was reflecting on her life. Felt hopeless and worthless. Felt like there was no pint going on. No associated psychosis. No associated mania. No substance use. No thoughts of violence. No homicidal thoughts. Past history of suicidal behavior. No access to weapons We  discussed adjusting her antidepressant. Patient has agreed to work with us and inform staff if she gets overwhelmed.   Continued Clinical Symptoms:  Alcohol Use Disorder Identification Test Final Score (AUDIT): 1 The "Alcohol Use Disorders Identification Test", Guidelines for Use in Primary Care, Second Edition.  World Science writerHealth Organization [facility](WHO). Score between 0-7:  no or low risk or alcohol related problems. Score between 8-15:  moderate risk of alcohol related problems. Score between 16-19:  high risk of alcohol related problems. Score 20 or above:  warrants further diagnostic evaluation for alcohol dependence and treatment.   CLINICAL FACTORS:  Depression Marital strain    Musculoskeletal: Strength & Muscle Tone: within normal limits Gait & Station: normal Patient leans: N/A  Psychiatric Specialty Exam: Physical Exam  Constitutional: She is oriented to person, place, and time. She appears well-developed and well-nourished.  HENT:  Head: Normocephalic and atraumatic.  Eyes: Pupils are equal, round, and reactive to light.  Cardiovascular: Normal rate.   Respiratory: Effort normal.  Musculoskeletal: Normal range of motion.  Neurological: She is alert and oriented to person, place, and time.  Skin: Skin is warm and dry.  Psychiatric:  As above    ROS  Blood pressure 132/89, pulse 98, temperature 98.2 F (36.8 C), temperature source Oral, resp. rate 18, height 5\' 4"  (1.626 m), weight 113.4 kg (250 lb).Body mass index is 42.91 kg/m.  General Appearance: Overweight, withdrawn.   Eye Contact:  Good  Speech:  Low tone and rate  Volume:  Decreased  Mood:  Depressed  Affect:  Blunted and mood congruent  Thought Process:  Linear  Orientation:  Full (Time, Place, and Person)  Thought Content:  Rumination  Suicidal Thoughts:  Yes.  with intent/plan  Homicidal Thoughts:  No  Memory:  Immediate;   Good Recent;   Good Remote;   Good  Judgement:  Impaired  Insight:  Good   Psychomotor Activity:  Decreased.   Concentration:  Concentration: Good and Attention Span: Good  Recall:  Good  Fund of Knowledge:  Good  Language:  Good  Akathisia:  Negative  Handed:    AIMS (if indicated):     Assets:  Communication Skills Desire for Improvement Housing Intimacy Physical Health  ADL's:  Intact  Cognition:  WNL  Sleep:  Number of Hours: 6.75      COGNITIVE FEATURES THAT CONTRIBUTE TO RISK:  None    SUICIDE RISK:   Severe:  Frequent, intense, and enduring suicidal ideation, specific plan, no subjective intent, but some objective markers of intent (i.e., choice of lethal method), the method is accessible, some limited preparatory behavior, evidence of impaired self-control, severe dysphoria/symptomatology, multiple risk factors present, and few if any protective factors, particularly a lack of social support.  PLAN OF CARE:  1. Suicide precautions 2. Increase Lexapro to 20 mg daily  I certify that inpatient services furnished can reasonably be expected to improve the patient's condition.   Georgiann Cocker, MD 01/24/2017, 4:28 PM

## 2017-01-24 NOTE — Progress Notes (Signed)
D: Pt denies SI/HI/AVH. Pt is pleasant and cooperative. Pt state she was "trying to be more social, I felt like I was bothering people". Pt seen in the dayroom interacting with peers. Pt stated she was feeling better overall, pt appears more cautious on the unit, but more out going.   A:   Extensive 1:1 time spent with pt.  Pt was offered support and encouragement. Pt was given scheduled medications. Pt was encourage to attend groups. Q 15 minute checks were done for safety.   R:Pt attends groups and interacts well with peers and staff. Pt is taking medication. Pt has no complaints at this time.Pt receptive to treatment and safety maintained on unit.

## 2017-01-24 NOTE — Progress Notes (Signed)
Adult Psychoeducational Group Note  Date:  01/24/2017 Time:  10:00 am  Group Topic/Focus:  Identifying Needs:   The focus of this group is to help patients identify their personal needs that have been historically problematic and identify healthy behaviors to address their needs.  Participation Level:  Did Not Attend  Participation Quality:    Affect:    Cognitive:    Insight:   Engagement in Group:    Modes of Intervention:    Additional Comments:    Yamila Cragin L 01/24/2017, 10:00 am  
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
## 2017-01-24 NOTE — Progress Notes (Signed)
Patient ID: [name] [name] A Goyal, female   DOB: [birth year], 31 y.o.   MRN: [id number] Patient reports having a good night's sleep last night, reports having a good appetite, reports that her concentration level is poor, and rates her depression as "6", her hopelessness as "6", and her anxiety level as "7".  Pt verbalizes +SI, denies having a plan, or intent, verbally CFS and states she will not do anything to hurt herself while she is hospitalized here.  Patient reported earlier in shift that her goal for the day will be to not feel suicidal, and in order to meet that goal, she would go to group sessions throughout the day.  Patient reported to this RN that her stressors prior to hospitalization were thoughts that she was being treated unfairly by the other staff at her place of work.  Empathy provided.  Patient denies HI/AVH, ate breakfast and lunch, Q15 minute checks currently in place.  Will continue to monitor.  Addendum: Patient ate all of her meals for today, and was observed interacting with staff and peers during morning group session.  Q15 minute checks in place, will report to oncoming shift.

## 2017-01-24 NOTE — [facility] Suicide Risk Assessment (Signed)
[facility] INPATIENT:  Family/Significant Other Suicide Prevention Education  Suicide Prevention Education:  Patient Refusal for Family/Significant Other Suicide Prevention Education: The patient [name] [name] has refused to provide written consent for family/significant other to be provided Family/Significant Other Suicide Prevention Education during admission and/or prior to discharge.  Physician notified.  Beverly Sessionsywan J Markeith Jue 01/24/2017, 4:54 PM

## 2017-01-25 ENCOUNTER — Encounter (HOSPITAL_COMMUNITY): Payer: Self-pay | Admitting: Behavioral Health

## 2017-01-25 LAB — PREGNANCY, URINE: PREG TEST UR: NEGATIVE

## 2017-01-25 LAB — RAPID URINE DRUG SCREEN, HOSP PERFORMED
Amphetamines: NOT DETECTED
BARBITURATES: NOT DETECTED
Benzodiazepines: NOT DETECTED
Cocaine: NOT DETECTED
Opiates: NOT DETECTED
Tetrahydrocannabinol: NOT DETECTED

## 2017-01-25 LAB — URINALYSIS, ROUTINE W REFLEX MICROSCOPIC
Bilirubin Urine: NEGATIVE
GLUCOSE, UA: NEGATIVE mg/dL
HGB URINE DIPSTICK: NEGATIVE
Ketones, ur: NEGATIVE mg/dL
NITRITE: NEGATIVE
Protein, ur: NEGATIVE mg/dL
SPECIFIC GRAVITY, URINE: 1.012 (ref 1.005–1.030)
pH: 6 (ref 5.0–8.0)

## 2017-01-25 LAB — HEMOGLOBIN A1C
Hgb A1c MFr Bld: 5 % (ref 4.8–5.6)
Mean Plasma Glucose: 97 mg/dL

## 2017-01-25 NOTE — [facility] Group Notes (Signed)
[facility] Group Notes:  (Nursing/MHT/Case Management/Adjunct)  Date:  01/25/2017  Time:  3:29 PM  Type of Therapy:  Nurse Education  Participation Level:  Active  Participation Quality:  Appropriate  Affect:  Appropriate  Cognitive:  Appropriate  Insight:  Appropriate  Engagement in Group:  Engaged  Modes of Intervention:  Activity  Summary of Progress/Problems:   Each patient was tasked with witting positive attributes about the other patients. Discussion followed about how positive statements can help self esteem  Almira Barenny G Deshana Rominger 01/25/2017, 3:29 PM

## 2017-01-25 NOTE — Progress Notes (Signed)
[facility] MD Progress Note  01/25/2017 11:05 AM [name] [name]  MRN:  [id number]  Subjective: " Things are going good. I am still adjusting to being here."  Objective: Face to face evaluation completed and chart reviewed. [name] is a 31 year old female who was admitted to [facility] for for depression and SI. During this evaluation, patient is alert and oriented x3, calm, and cooperative. She continues to present with a depressed mood although pleasant affect. She continues to identifies her main stressors as a recent separation from her husband and begin overwhelmed from working two jobs. She ruminates about the separation a lot which appears to be her greatest concern. She currently denies active or passive suicidal thoughts with plan or intent, homicidal ideas, or urges to self-harm. She denies psychosis and does not appear to be internally preoccupied. Endorses good appetite and sleeping pattern. Remains complaint with therapeutic milieu and no behavorial issues have been observed. Her Lexapro was increased  to 20 mg daily by MD. Thus far she reports the medication is well tolerated and without side effects. She does however endorse increased SI after starting the Lexapro in the past and this was documented in notes. At this time, patient was able to contract for safety on the unit.     Principal Problem: MDD (major depressive disorder), recurrent severe, without psychosis ([place]) Diagnosis:   Patient Active Problem List   Diagnosis Date Noted  . MDD (major depressive disorder), recurrent severe, without psychosis ([place]) [F33.2] 01/24/2017  . Suicidal ideation [R45.851] 01/24/2017  . MDD (major depressive disorder), recurrent episode ([place]) [F33.9] 01/23/2017   Total Time spent with patient: 30 minutes  Past Psychiatric History:  MDD, anxiety, 1 prior SA, intermittent SI. Denies previous inpatient psychiatric hospitalizations. Current medications Lexapro, Klonopin. Medications are prescribed by her primary  care physician at [facility]. Reports she believes Lexapro have worsened her SI. Reports past medication trials as; Xanax, Inderal for anxiety, Celexa, Sapharis, and Prozac. Reports Prozac caused severe headaches.  No current psychiatrist or therapist..   Past Medical History:  Past Medical History:  Diagnosis Date  . Anxiety   . Depression   . GERD (gastroesophageal reflux disease)   . History of kidney stones     Past Surgical History:  Procedure Laterality Date  . CYSTOSCOPY WITH RETROGRADE PYELOGRAM, URETEROSCOPY AND STENT PLACEMENT Right 12/10/2016   Procedure: CYSTOSCOPY WITH RETROGRADE PYELOGRAM, URETEROSCOPY AND STONE REMOVAL;  Surgeon: Raynelle Bring, MD;  Location: [facility];  Service: Urology;  Laterality: Right;  . WISDOM TOOTH EXTRACTION  at age 65   Family History: History reviewed. No pertinent family history. Family Psychiatric  History: Pt reports there is a family history of mental illness which she states was not treated in her mom Social History:  History  Alcohol Use  . 0.6 oz/week  . 1 Glasses of wine per week    Comment: per week     History  Drug Use No    Social History   Social History  . Marital status: Married    Spouse name: N/A  . Number of children: N/A  . Years of education: N/A   Social History Main Topics  . Smoking status: Never Smoker  . Smokeless tobacco: Never Used  . Alcohol use 0.6 oz/week    1 Glasses of wine per week     Comment: per week  . Drug use: No  . Sexual activity: Yes    Birth control/ protection: IUD   Other Topics Concern  .  None   Social History Narrative  . None   Additional Social History:    Pain Medications: denies Prescriptions: denies Over the Counter: denies History of alcohol / drug use?: No history of alcohol / drug abuse Longest period of sobriety (when/how long): denies Negative Consequences of Use:  (denies)    Sleep: Fair  Appetite:  Fair  Current Medications: Current  Facility-Administered Medications  Medication Dose Route Frequency Provider Last Rate Last Dose  . acetaminophen (TYLENOL) tablet 650 mg  650 mg Oral Q6H PRN Derrill Center, NP   650 mg at 01/24/17 2128  . alum & mag hydroxide-simeth (MAALOX/MYLANTA) 200-200-20 MG/5ML suspension 30 mL  30 mL Oral Q4H PRN Derrill Center, NP      . clonazePAM Bobbye [place]) tablet 0.5 mg  0.5 mg Oral BID PRN Lindon Romp A, NP   0.5 mg at 01/23/17 2024  . escitalopram (LEXAPRO) tablet 20 mg  20 mg Oral QHS Izediuno, Laruth Bouchard, MD   20 mg at 01/24/17 2125  . magnesium hydroxide (MILK OF MAGNESIA) suspension 30 mL  30 mL Oral Daily PRN Derrill Center, NP      . traZODone (DESYREL) tablet 50 mg  50 mg Oral QHS PRN Derrill Center, NP   50 mg at 01/23/17 2125    Lab Results:  Results for orders placed or performed during the hospital encounter of 01/23/17 (from the past 48 hour(s))  CBC     Status: None   Collection Time: 01/24/17  6:11 AM  Result Value Ref Range   WBC 6.5 4.0 - 10.5 K/uL   RBC 4.42 3.87 - 5.11 MIL/uL   Hemoglobin 13.7 12.0 - 15.0 g/dL   HCT 39.8 36.0 - 46.0 %   MCV 90.0 78.0 - 100.0 fL   MCH 31.0 26.0 - 34.0 pg   MCHC 34.4 30.0 - 36.0 g/dL   RDW 13.4 11.5 - 15.5 %   Platelets 298 150 - 400 K/uL    Comment: Performed at [facility], [place] [address] [place], [place] [address]  Comprehensive metabolic panel     Status: Abnormal   Collection Time: 01/24/17  6:11 AM  Result Value Ref Range   Sodium 140 135 - 145 mmol/L   Potassium 3.6 3.5 - 5.1 mmol/L   Chloride 106 101 - 111 mmol/L   CO2 26 22 - 32 mmol/L   Glucose, Bld 103 (H) 65 - 99 mg/dL   BUN 8 6 - 20 mg/dL   Creatinine, Ser 0.76 0.44 - 1.00 mg/dL   Calcium 9.0 8.9 - 10.3 mg/dL   Total Protein 6.6 6.5 - 8.1 g/dL   Albumin 3.9 3.5 - 5.0 g/dL   AST 19 15 - 41 U/L   ALT 15 14 - 54 U/L   Alkaline Phosphatase 70 38 - 126 U/L   Total Bilirubin 0.7 0.3 - 1.2 mg/dL   GFR calc non Af Amer >60 >60 mL/min   GFR calc Af  Amer >60 >60 mL/min    Comment: (NOTE) The eGFR has been calculated using the CKD EPI equation. This calculation has not been validated in all clinical situations. eGFR's persistently <60 mL/min signify possible Chronic Kidney Disease.    Anion gap 8 5 - 15    Comment: Performed at [facility], [place] [address] [place],  [address]  TSH     Status: None   Collection Time: 01/24/17  6:11 AM  Result Value Ref Range  TSH 1.508 0.350 - 4.500 uIU/mL    Comment: Performed by a 3rd Generation assay with a functional sensitivity of <=0.01 uIU/mL. Performed at [facility], [place] [address], [place], [place] [address]   Lipid panel     Status: Abnormal   Collection Time: 01/24/17  6:11 AM  Result Value Ref Range   Cholesterol 175 0 - 200 mg/dL   Triglycerides 171 (H) <150 mg/dL   HDL 43 >40 mg/dL   Total CHOL/HDL Ratio 4.1 RATIO   VLDL 34 0 - 40 mg/dL   LDL Cholesterol 98 0 - 99 mg/dL    Comment:        Total Cholesterol/HDL:CHD Risk Coronary Heart Disease Risk Table                     Men   Women  1/2 Average Risk   3.4   3.3  Average Risk       5.0   4.4  2 X Average Risk   9.6   7.1  3 X Average Risk  23.4   11.0        Use the calculated Patient Ratio above and the CHD Risk Table to determine the patient's CHD Risk.        ATP III CLASSIFICATION (LDL):  <100     mg/dL   Optimal  100-129  mg/dL   Near or Above                    Optimal  130-159  mg/dL   Borderline  160-189  mg/dL   High  >190     mg/dL   Very High Performed at [facility] [address], [place], [place] [address]     Blood Alcohol level:  No results found for: [facility]  Metabolic Disorder Labs: No results found for: HGBA1C, MPG No results found for: PROLACTIN Lab Results  Component Value Date   CHOL 175 01/24/2017   TRIG 171 (H) 01/24/2017   HDL 43 01/24/2017   CHOLHDL 4.1 01/24/2017   VLDL 34 01/24/2017   LDLCALC 98 01/24/2017    Physical  Findings: AIMS: Facial and Oral Movements Muscles of Facial Expression: None, normal Lips and Perioral Area: None, normal Jaw: None, normal Tongue: None, normal,Extremity Movements Upper (arms, wrists, hands, fingers): None, normal Lower (legs, knees, ankles, toes): None, normal, Trunk Movements Neck, shoulders, hips: None, normal, Overall Severity Severity of abnormal movements (highest score from questions above): None, normal Incapacitation due to abnormal movements: None, normal Patient's awareness of abnormal movements (rate only patient's report): No Awareness, Dental Status Current problems with teeth and/or dentures?: No Does patient usually wear dentures?: No  CIWA:    COWS:     Musculoskeletal: Strength & Muscle Tone: within normal limits Gait & Station: normal Patient leans: N/A  Psychiatric Specialty Exam: Physical Exam  Nursing note and vitals reviewed. Constitutional: She is oriented to person, place, and time.  Neurological: She is alert and oriented to person, place, and time.    Review of Systems  Psychiatric/Behavioral: Positive for depression. Negative for hallucinations, memory loss, substance abuse and suicidal ideas. The patient is nervous/anxious. The patient does not have insomnia.   All other systems reviewed and are negative.   Blood pressure 124/82, pulse 93, temperature 98.7 F (37.1 C), temperature source Oral, resp. rate 16, height '5\' 4"'$  (1.626 m), weight 250 lb (113.4 kg).Body mass index is 42.91 kg/m.  General Appearance: Fairly  Groomed  Eye Contact:  Good  Speech:  Clear and Coherent and Normal Rate  Volume:  Normal  Mood:  Anxious and Depressed  Affect:  Depressed  Thought Process:  Coherent, Goal Directed, Linear and Descriptions of Associations: Intact  Orientation:  Full (Time, Place, and Person)  Thought Content:  Logical denies AVH,   Suicidal Thoughts:  No denies SI at this time. Is able to contract for safety on the unit.    Homicidal Thoughts:  No  Memory:  Immediate;   Fair Recent;   Fair  Judgement:  Impaired  Insight:  Fair  Psychomotor Activity:  Normal  Concentration:  Concentration: Fair and Attention Span: Fair  Recall:  [employer] of Knowledge:  Good  Language:  Good  Akathisia:  Negative  Handed:  Right  AIMS (if indicated):     Assets:  Desire for Improvement Intimacy Resilience  ADL's:  Intact  Cognition:  WNL  Sleep:  Number of Hours: 6.75     Treatment Plan Summary: Daily contact with patient to assess and evaluate symptoms and progress in treatment   Medication management: Psychiatric conditions are unstable at this time. To reduce current symptoms to base line and improve the patient's overall level of functioning will continue Lexapro to 20 mg daily for depression management. Will continue Klonopin 0.5 mg bid as needed for anxiety. Will monitor response to medications and adjust plan as appropriate.      Other:  Safety: Will continue 15 minute observation for safety checks. Patient is able to contract for safety on the unit at this time.   Labs: UDS, UA, and urine pregnancy in process. HgbA1c in process.  Continue to develop treatment plan to decrease risk of relapse upon discharge and to reduce the need for readmission.  Psycho-social education regarding relapse prevention and self care.  Health care follow up as needed for medical problems. Elevated triglycerides 171.  Continue to attend and participate in therapy.     Mordecai Maes, NP 01/25/2017, 11:05 AM

## 2017-01-25 NOTE — [facility] Group Notes (Signed)
[facility]:  (Clinical Social Work)   01/11/2017    1:00-2:00PM  Summary of Progress/Problems:   The main focus of today's process group was to   1)  discuss importance of adding supports to stay well once out of the hospital  2)  generate ideas about what healthy supports can be added   An emphasis was placed on using counselor, doctor, therapy groups, 12-step groups, and problem-specific support groups to expand supports.    The patient stated her husband is her healthy support most of the time although he does not fully understand her mental illness.  She has a therapist as well who does EMDR with her, but stated that she is unsure if she should seek another therapist because when pt needs specific coping skills to use, the therapist seems not to know what to tell her.  The group talked about the possibility of her going to the support groups and/or Wellness Academy classes offered at the [facility] [employer] [facility] [place].  She also talked about being interested in Certified Peer Support Specialists classes.  Type of Therapy:  Process Group with Motivational Interviewing  Participation Level:  Active  Participation Quality:  Appropriate, Attentive, Sharing and Supportive  Affect:  Appropriate  Cognitive:  Appropriate  Insight:  Engaged  Engagement in Therapy:  Engaged  Modes of Intervention:   Education, Support and Processing  Ambrose MantleMareida Grossman-Orr, LCSW 01/11/2017    2:17 PM

## 2017-01-25 NOTE — Plan of Care (Signed)
Problem: Safety: Goal: Periods of time without injury will increase Outcome: Completed/Met Date Met: 01/25/17 Patient has not engaged in self harm, denies thoughts to do so. No SI. Verbal contract for safety.  Problem: Medication: Goal: Compliance with prescribed medication regimen will improve Outcome: Progressing Patient has been and remains med compliant.

## 2017-01-25 NOTE — Progress Notes (Addendum)
D: Spoke with patient 1:1 who has been up and visible in the dayroom. Currently resting in bed as she elected not to go down for rec time. Patient is attending groups, interacts with select peers. Patient's affect blunted, mood slightly anxious and depressed though pleasant. Rating depression at a 2/10, hopelessness at a 3/10 and anxiety at a 3/10. Rates sleep as good, appetite as good, energy as normal and concentration as good.  States goal for today is to "I want to work on learning how to define myself instead of looking to others and more coping skills." Reporting bilat foot pain of a 5/10 which she says was present PTA. Does not desire prn for pain. No other physical problems .   A: Medicated per orders, no prn medications required or requested. Level III obs in place for safety. Emotional support offered and self inventory reviewed. Encouraged completion of Suicide Safety Plan and programming participation. Urine specimen obtained and stored for lab pick up this evening. EKG completed, per order 01/23/17.  R: Patient verbalizes understanding of POC. EKG transmitted and prelim results indicate NSR. Patient denies SI/HI/AVH and remains safe on level III obs. Will continue to monitor and support.

## 2017-01-25 NOTE — Progress Notes (Signed)
Patient ID: [name] [name], female   DOB: 14-Feb-1986, 31 y.o.   MRN: [id number]  Pt currently presents with a flat affect and anxious behavior. Pt reports to writer that their goal is to "not let others make me feel bad and figure out who I am." Pt preoccupied with her husbands verbal comments about her depression.  Pt requests to have a family meeting with the MD and CSW. Pt reports good sleep with current medication regimen.   Pt provided with medications per providers orders. Pt's labs and vitals were monitored throughout the night. Pt given a 1:1 about emotional and mental status. Pt supported and encouraged to express concerns and questions. Pt educated on medications, CBT and assertiveness skills.   Pt's safety ensured with 15 minute and environmental checks. Pt currently denies SI/HI and A/V hallucinations. Pt verbally agrees to seek staff if SI/HI or A/VH occurs and to consult with staff before acting on any harmful thoughts. Will continue POC.

## 2017-01-25 NOTE — BHH Group Notes (Signed)
Adult Psychoeducational Group Note  Date:  01/25/2017 Time:  10:07 PM  Group Topic/Focus:  Wrap-Up Group:   The focus of this group is to help patients review their daily goal of treatment and discuss progress on daily workbooks.  Participation Level:  Active  Participation Quality:  Appropriate  Affect:  Appropriate  Cognitive:  Appropriate  Insight: Appropriate  Engagement in Group:  Engaged  Modes of Intervention:  Discussion  Additional Comments:  Pt. Stated that she met her goal today. Pt. Goal was to be more social throughout the day.  Wallace Going, [place] Ganoe E 01/25/2017, 10:07 PM

## 2017-01-26 DIAGNOSIS — G4700 Insomnia, unspecified: Secondary | ICD-10-CM

## 2017-01-26 DIAGNOSIS — F419 Anxiety disorder, unspecified: Secondary | ICD-10-CM

## 2017-01-26 MED ORDER — TRIAMCINOLONE 0.1 % CREAM:EUCERIN CREAM 1:1
TOPICAL_CREAM | Freq: Two times a day (BID) | CUTANEOUS | Status: DC
  Administered 2017-01-26 – 2017-01-27 (×2): via TOPICAL
  Filled 2017-01-26: qty 1

## 2017-01-26 NOTE — Progress Notes (Signed)
D: Pt presents with flat affect and anxious mood. Pt rates anxiety 3/0 "baseline" and depression 2/10. Pt denies active SI/HI. Pt verbalizes ongoing depression, suicidal thoughts and feeling stressed. Pt reported that her stress is related to her working two jobs and a lack of sleep. Pt verbalized martial issues with her husband and stated that he doesn't understand her depression and coping mechanisms. Pt stated goal is to no longer endorse suicidal thoughts and find better ways to cope with her depression. Pt stated that the increase dose of Lexapro is causing her to feel dizzy. Fluids encouraged and given. B/p assessed. MD notified. Pt given a handout on Lexapro. Fall prevention reviewed with pt. A: Medications reviewed with pt. Medications administered as ordered per MD. Verbal support provided. Pt encouraged to attend groups. 15 minute checks performed for safety. R: Pt receptive to tx.

## 2017-01-26 NOTE — Tx Team (Signed)
Interdisciplinary Treatment and Diagnostic Plan Update 01/26/2017 Time of Session: 9:30am  [place] A Radziewicz  MRN: [id number]  Principal Diagnosis: MDD (major depressive disorder), recurrent severe, without psychosis (HCC)  Secondary Diagnoses: Principal Problem:   MDD (major depressive disorder), recurrent severe, without psychosis (HCC) Active Problems:   Suicidal ideation   Current Medications:  Current Facility-Administered Medications  Medication Dose Route Frequency Provider Last Rate Last Dose  . acetaminophen (TYLENOL) tablet 650 mg  650 mg Oral Q6H PRN Oneta Rack, NP   650 mg at 01/25/17 2145  . alum & mag hydroxide-simeth (MAALOX/MYLANTA) 200-200-20 MG/5ML suspension 30 mL  30 mL Oral Q4H PRN Oneta Rack, NP      . clonazePAM Scarlette Calico) tablet 0.5 mg  0.5 mg Oral BID PRN Nira Conn A, NP   0.5 mg at 01/23/17 2024  . escitalopram (LEXAPRO) tablet 20 mg  20 mg Oral QHS Izediuno, Delight Ovens, MD   20 mg at 01/25/17 2145  . magnesium hydroxide (MILK OF MAGNESIA) suspension 30 mL  30 mL Oral Daily PRN Oneta Rack, NP      . traZODone (DESYREL) tablet 50 mg  50 mg Oral QHS PRN Oneta Rack, NP   50 mg at 01/23/17 2125  . triamcinolone 0.1 % cream : eucerin cream, 1:1   Topical BID Cobos, Rockey Situ, MD        PTA Medications: Prescriptions Prior to Admission  Medication Sig Dispense Refill Last Dose  . cyanocobalamin 1000 MCG tablet Take 1,000 mcg by mouth at bedtime.    Past Week at Unknown time  . escitalopram (LEXAPRO) 10 MG tablet Take 10 mg by mouth at bedtime.    Past Week at Unknown time  . fluticasone (FLONASE) 50 MCG/ACT nasal spray Place 1 spray into both nostrils at bedtime.   Past Month at Unknown time  . loratadine (CLARITIN) 10 MG tablet Take 10 mg by mouth at bedtime.   Past Week at Unknown time  . cholecalciferol (VITAMIN D) 1000 units tablet Take 1,000 Units by mouth at bedtime.   12/09/2016 at Unknown time  . clonazePAM (KLONOPIN) 0.5 MG tablet  Take 0.5 mg by mouth 2 (two) times daily as needed for anxiety.   Past Week at Unknown time  . levonorgestrel (MIRENA) 20 MCG/24HR IUD 1 each by Intrauterine route once.   06/2014    Treatment Modalities: Medication Management, Group therapy, Case management,  1 to 1 session with clinician, Psychoeducation, Recreational therapy.  Patient Stressors:   Patient Strengths: Capable of independent living Wellsite geologist fund of knowledge Motivation for treatment/growth Physical Health Supportive family/friends  Physician Treatment Plan for Primary Diagnosis: MDD (major depressive disorder), recurrent severe, without psychosis (HCC) Long Term Goal(s): Improvement in symptoms so as ready for discharge Short Term Goals: Ability to identify changes in lifestyle to reduce recurrence of condition will improve Ability to verbalize feelings will improve Compliance with prescribed medications will improve Ability to identify triggers associated with substance abuse/mental health issues will improve Ability to disclose and discuss suicidal ideas Ability to identify and develop effective coping behaviors will improve  Medication Management: Evaluate patient's response, side effects, and tolerance of medication regimen.  Therapeutic Interventions: 1 to 1 sessions, Unit Group sessions and Medication administration.  Evaluation of Outcomes: Progressing  Physician Treatment Plan for Secondary Diagnosis: Principal Problem:   MDD (major depressive disorder), recurrent severe, without psychosis (HCC) Active Problems:   Suicidal ideation  Long Term Goal(s): Improvement in symptoms so as  ready for discharge  Short Term Goals: Ability to identify changes in lifestyle to reduce recurrence of condition will improve Ability to verbalize feelings will improve Compliance with prescribed medications will improve Ability to identify triggers associated with substance abuse/mental health issues  will improve Ability to disclose and discuss suicidal ideas Ability to identify and develop effective coping behaviors will improve  Medication Management: Evaluate patient's response, side effects, and tolerance of medication regimen.  Therapeutic Interventions: 1 to 1 sessions, Unit Group sessions and Medication administration.  Evaluation of Outcomes: Progressing  RN Treatment Plan for Primary Diagnosis: MDD (major depressive disorder), recurrent severe, without psychosis (HCC) Long Term Goal(s): Knowledge of disease and therapeutic regimen to maintain health will improve  Short Term Goals: Compliance with prescribed medications will improve  Medication Management: RN will administer medications as ordered by provider, will assess and evaluate patient's response and provide education to patient for prescribed medication. RN will report any adverse and/or side effects to prescribing provider.  Therapeutic Interventions: 1 on 1 counseling sessions, Psychoeducation, Medication administration, Evaluate responses to treatment, Monitor vital signs and CBGs as ordered, Perform/monitor CIWA, COWS, AIMS and Fall Risk screenings as ordered, Perform wound care treatments as ordered.  Evaluation of Outcomes: Progressing  LCSW Treatment Plan for Primary Diagnosis: MDD (major depressive disorder), recurrent severe, without psychosis (HCC) Long Term Goal(s): Safe transition to appropriate next level of care at discharge, Engage patient in therapeutic group addressing interpersonal concerns. Short Term Goals: Engage patient in aftercare planning with referrals and resources, Increase ability to appropriately verbalize feelings, Increase emotional regulation, Identify triggers associated with mental health/substance abuse issues and Increase skills for wellness and recovery  Therapeutic Interventions: Assess for all discharge needs, 1 to 1 time with Social worker, Explore available resources and support  systems, Assess for adequacy in community support network, Educate family and significant other(s) on suicide prevention, Complete Psychosocial Assessment, Interpersonal group therapy.  Evaluation of Outcomes: Progressing  Progress in Treatment: Attending groups: Yes  Participating in groups: Yes Taking medication as prescribed: Yes, MD continues to assess for medication changes as needed Toleration medication: Yes, no side effects reported at this time Family/Significant other contact made: No, pt declined contact Patient understands diagnosis: Yes, AEB pt's willingness to participate in treatment Discussing patient identified problems/goals with staff: Yes Medical problems stabilized or resolved: Yes Denies suicidal/homicidal ideation: No, pt recently admitted for SI Issues/concerns per patient self-inventory: None Other: N/A  New problem(s) identified: None identified at this time.   New Short Term/Long Term Goal(s): "I would like to not feel suicidal and develop better coping skills"  Discharge Plan or Barriers: Pt will return home and follow up with an outpatient provider.  Reason for Continuation of Hospitalization:  Anxiety  Depression Medication stabilization Suicidal ideation  Estimated Length of Stay: 3-5 days  Attendees: Patient: [name] [name] 01/26/2017 4:28 PM  Physician: Dr. Jama Flavorsobos 01/26/2017 4:28 PM  Nursing: Foy Guadalajarahrista, RN; [place] NortePatrice, RN 01/26/2017 4:28 PM  RN Care Manager:  01/26/2017 4:28 PM  Social Worker: Donnelly StagerLynn Kaelah Hayashi, LCSWA 01/26/2017 4:28 PM  Recreational Therapist:  01/26/2017 4:28 PM  Other: 01/26/2017 4:28 PM  Other:  01/26/2017 4:28 PM  Other: 01/26/2017 4:28 PM   Scribe for Treatment Team: Jonathon JordanLynn B Dyllan Kats, MSW,LCSWA 01/26/2017 4:28 PM

## 2017-01-26 NOTE — Progress Notes (Signed)
Adult Psychoeducational Group Note  Date:  01/26/2017 Time:  2:14 PM  Group Topic/Focus:  Recovery Goals:   The focus of this group is to identify appropriate goals for recovery and establish a plan to achieve them.  Participation Level:  Active  Participation Quality:  Appropriate  Affect:  Appropriate  Cognitive:  Alert and Appropriate  Insight: Good  Engagement in Group:  Engaged  Modes of Intervention:  Discussion  Additional Comments:  Pt did participate in group today.  Pt states that she came here because of suicidal thoughts and was overwhelmed with things going on in her life, pt states that she also suffers from depression.  Pt states that the groups have been helping and that she is feeling better now than when she first arrived. Pt wants to go to outpatient therapy after she discharges.  Ashiyah Pavlak R Kanita Delage 01/26/2017, 2:14 PM

## 2017-01-26 NOTE — [facility] Group Notes (Signed)
[facility] LCSW Group Therapy  01/26/2017 1:15pm  Type of Therapy: Group Therapy   Topic: Overcoming Obstacles  Participation Level: Active  Participation Quality: Appropriate   Affect: Appropriate  Cognitive: Appropriate and Oriented  Insight: Developing/Improving and Improving  Engagement in Therapy: Improving  Modes of Intervention: Discussion, Exploration, Problem-solving and Support  Description of Group:  In this group patients will be encouraged to explore what they see as obstacles to their own wellness and recovery. They will be guided to discuss their thoughts, feelings, and behaviors related to these obstacles. The group will process together ways to cope with barriers, with attention given to specific choices patients can make. Each patient will be challenged to identify changes they are motivated to make in order to overcome their obstacles. This group will be process-oriented, with patients participating in exploration of their own experiences as well as giving and receiving support and challenge from other group members.  Summary of Patient Progress:  Pt states that her primary obstacle at the moment is juggling two jobs. Pt states that she works two jobs because she wants security in the event that her husband leaves her. However, working both jobs has become exhausting and pt's sleep is suffering because of it. Pt states that she is thinking about scaling her hours back on one of her jobs and deciding to trust her husband in regards to the stability of their relationship.  Therapeutic Modalities:  Cognitive Behavioral Therapy Solution Focused Therapy Motivational Interviewing Relapse Prevention Therapy  Jonathon JordanLynn B Janine Reller, MSW, Theresia MajorsLCSWA [phone]

## 2017-01-26 NOTE — Progress Notes (Signed)
[facility] MD Progress Note  01/26/2017 3:09 PM [name] [name]  MRN:  [id number]  Subjective:  Patient reports feeling better than on admission. She attributes recent symptoms/decompensation largely to  psychosocial stressors, mainly relationship stress and work related stress . States there had been some marital stressors, including a recent separation ( they are now back together) . Also, reports she has been working two hours, including at different times of day/night, resulting in having little time for sleep or to relax. She feels sleep deprivation contributed to her decompensation.  At this time denies medications are working , well tolerated . Denies suicidal ideations .   Objective: patient seen and case reviewed with staff . Patient reports improving mood and states she is feeling better than on admission. Denies any current suicidal ideations. She tends to ruminate about stressors as noted above, but is future oriented , and plans to cut down on working hours after she is discharged. She is also interested in referral for couples therapy . No disruptive or agitated behaviors on unit, going to groups.     Principal Problem: MDD (major depressive disorder), recurrent severe, without psychosis (HCC) Diagnosis:   Patient Active Problem List   Diagnosis Date Noted  . MDD (major depressive disorder), recurrent severe, without psychosis (HCC) [F33.2] 01/24/2017  . Suicidal ideation [R45.851] 01/24/2017  . MDD (major depressive disorder), recurrent episode (HCC) [F33.9] 01/23/2017   Total Time spent with patient: 20 minutes   Past Medical History:  Past Medical History:  Diagnosis Date  . Anxiety   . Depression   . GERD (gastroesophageal reflux disease)   . History of kidney stones     Past Surgical History:  Procedure Laterality Date  . CYSTOSCOPY WITH RETROGRADE PYELOGRAM, URETEROSCOPY AND STENT PLACEMENT Right 12/10/2016   Procedure: CYSTOSCOPY WITH RETROGRADE PYELOGRAM,  URETEROSCOPY AND STONE REMOVAL;  Surgeon: Heloise Purpura, MD;  Location: [facility];  Service: Urology;  Laterality: Right;  . WISDOM TOOTH EXTRACTION  at age 80   Family History: History reviewed. No pertinent family history. Family Psychiatric  History: Pt reports there is a family history of mental illness which she states was not treated in her mom Social History:  History  Alcohol Use  . 0.6 oz/week  . 1 Glasses of wine per week    Comment: per week     History  Drug Use No    Social History   Social History  . Marital status: Married    Spouse name: N/A  . Number of children: N/A  . Years of education: N/A   Social History Main Topics  . Smoking status: Never Smoker  . Smokeless tobacco: Never Used  . Alcohol use 0.6 oz/week    1 Glasses of wine per week     Comment: per week  . Drug use: No  . Sexual activity: Yes    Birth control/ protection: IUD   Other Topics Concern  . None   Social History Narrative  . None   Additional Social History:    Pain Medications: denies Prescriptions: denies Over the Counter: denies History of alcohol / drug use?: No history of alcohol / drug abuse Longest period of sobriety (when/how long): denies Negative Consequences of Use:  (denies)    Sleep: improved   Appetite:  improved   Current Medications: Current Facility-Administered Medications  Medication Dose Route Frequency Provider Last Rate Last Dose  . acetaminophen (TYLENOL) tablet 650 mg  650 mg Oral Q6H PRN Oneta Rack, NP  650 mg at 01/25/17 2145  . alum & mag hydroxide-simeth (MAALOX/MYLANTA) 200-200-20 MG/5ML suspension 30 mL  30 mL Oral Q4H PRN Oneta Rack, NP      . clonazePAM Scarlette Calico) tablet 0.5 mg  0.5 mg Oral BID PRN Nira Conn A, NP   0.5 mg at 01/23/17 2024  . escitalopram (LEXAPRO) tablet 20 mg  20 mg Oral QHS Izediuno, Delight Ovens, MD   20 mg at 01/25/17 2145  . magnesium hydroxide (MILK OF MAGNESIA) suspension 30 mL  30 mL Oral Daily PRN  Oneta Rack, NP      . traZODone (DESYREL) tablet 50 mg  50 mg Oral QHS PRN Oneta Rack, NP   50 mg at 01/23/17 2125  . triamcinolone 0.1 % cream : eucerin cream, 1:1   Topical BID Kennis Buell, Rockey Situ, MD        Lab Results:  Results for orders placed or performed during the hospital encounter of 01/23/17 (from the past 48 hour(s))  Urine rapid drug screen (hosp performed)     Status: None   Collection Time: 01/25/17 11:50 AM  Result Value Ref Range   Opiates NONE DETECTED NONE DETECTED   Cocaine NONE DETECTED NONE DETECTED   Benzodiazepines NONE DETECTED NONE DETECTED   Amphetamines NONE DETECTED NONE DETECTED   Tetrahydrocannabinol NONE DETECTED NONE DETECTED   Barbiturates NONE DETECTED NONE DETECTED    Comment:        DRUG SCREEN FOR MEDICAL PURPOSES ONLY.  IF CONFIRMATION IS NEEDED FOR ANY PURPOSE, NOTIFY LAB WITHIN 5 DAYS.        LOWEST DETECTABLE LIMITS FOR URINE DRUG SCREEN Drug Class       Cutoff (ng/mL) Amphetamine      1000 Barbiturate      200 Benzodiazepine   200 Tricyclics       300 Opiates          300 Cocaine          300 THC              50 Performed at [facility], [address] [place], [place] [address]   Pregnancy, urine     Status: None   Collection Time: 01/25/17 11:55 AM  Result Value Ref Range   Preg Test, Ur NEGATIVE NEGATIVE    Comment:        THE SENSITIVITY OF THIS METHODOLOGY IS >20 mIU/mL. Performed at [facility], [address] [place], [place] [address]   Urinalysis, Routine w reflex microscopic     Status: Abnormal   Collection Time: 01/25/17 11:55 AM  Result Value Ref Range   Color, Urine YELLOW YELLOW   APPearance CLEAR CLEAR   Specific Gravity, Urine 1.012 1.005 - 1.030   pH 6.0 5.0 - 8.0   Glucose, UA NEGATIVE NEGATIVE mg/dL   Hgb urine dipstick NEGATIVE NEGATIVE   Bilirubin Urine NEGATIVE NEGATIVE   Ketones, ur NEGATIVE NEGATIVE mg/dL   Protein, ur NEGATIVE NEGATIVE mg/dL    Nitrite NEGATIVE NEGATIVE   Leukocytes, UA LARGE (A) NEGATIVE   RBC / HPF 0-5 0 - 5 RBC/hpf   WBC, UA 6-30 0 - 5 WBC/hpf   Bacteria, UA RARE (A) NONE SEEN   Squamous Epithelial / LPF 6-30 (A) NONE SEEN   Mucous PRESENT     Comment: Performed at [facility], [address] [place], [place] [address]    Blood Alcohol level:  No results found for: [facility]  Metabolic Disorder Labs: Lab Results  Component Value Date   HGBA1C 5.0 01/24/2017   MPG 97 01/24/2017   No results found for: PROLACTIN Lab Results  Component Value Date   CHOL 175 01/24/2017   TRIG 171 (H) 01/24/2017   HDL 43 01/24/2017   CHOLHDL 4.1 01/24/2017   VLDL 34 01/24/2017   LDLCALC 98 01/24/2017    Physical Findings: AIMS: Facial and Oral Movements Muscles of Facial Expression: None, normal Lips and Perioral Area: None, normal Jaw: None, normal Tongue: None, normal,Extremity Movements Upper (arms, wrists, hands, fingers): None, normal Lower (legs, knees, ankles, toes): None, normal, Trunk Movements Neck, shoulders, hips: None, normal, Overall Severity Severity of abnormal movements (highest score from questions above): None, normal Incapacitation due to abnormal movements: None, normal Patient's awareness of abnormal movements (rate only patient's report): No Awareness, Dental Status Current problems with teeth and/or dentures?: No Does patient usually wear dentures?: No  CIWA:    COWS:     Musculoskeletal: Strength & Muscle Tone: within normal limits Gait & Station: normal Patient leans: N/A  Psychiatric Specialty Exam: Physical Exam  Nursing note and vitals reviewed. Constitutional: She is oriented to person, place, and time.  Neurological: She is alert and oriented to person, place, and time.    Review of Systems  Psychiatric/Behavioral: Positive for depression. Negative for hallucinations, memory loss, substance abuse and suicidal ideas. The patient is nervous/anxious. The  patient does not have insomnia.   All other systems reviewed and are negative. denies nausea, denies vomiting, no fever, no chills   Blood pressure 132/84, pulse 91, temperature 98.9 F (37.2 C), temperature source Oral, resp. rate 18, height 5\' 4"  (1.626 m), weight 113.4 kg (250 lb).Body mass index is 42.91 kg/m.  General Appearance: Well Groomed  Eye Contact:  Good  Speech:  Normal Rate  Volume:  Normal  Mood:  improving , less depressed   Affect:  more reactive  Thought Process:  Linear and Descriptions of Associations: Intact  Orientation:  Full (Time, Place, and Person)  Thought Content:  no hallucinations, no delusions, not internally preoccupied   Suicidal Thoughts:  No denies any current suicidal or self injurious ideations, also denies any homicidal or violent ideations, and specifically denies any violent ideations towards her husband   Homicidal Thoughts:  No  Memory:  Recent and remote grossly intact   Judgement:  Other:  improving   Insight:  improving   Psychomotor Activity:  Normal  Concentration:  Concentration: Good and Attention Span: Good  Recall:  Good  Fund of Knowledge:  Good  Language:  Good  Akathisia:  Negative  Handed:  Right  AIMS (if indicated):     Assets:  Desire for Improvement Intimacy Resilience  ADL's:  Intact  Cognition:  WNL  Sleep:  Number of Hours: 6.5   Assessment - patient is presenting with improving mood and range of affect. Denies SI. She does tend to ruminate about work related and marital stressors, but states she is feeling optimistic about being able to work on these stressors after discharge- is future oriented. Wants to cut down on work hours, and is hoping she and her husband will start couples therapy as well . Denies medication side effects at this time  Treatment Plan Summary: Treatment plan reviewed today 6/25  Daily contact with patient to assess and evaluate symptoms and progress in treatment  Encourage group and milieu  participation to work on coping skills and symptom reduction  Continue Lexapro 20 mgrs  QDAY for depression and anxiety Continue Trazodone 50 mgrs QHS PRN for insomnia Continue Klonopin 0.5 mgrs Q 12 hours PRN for anxiety Treatment team working on disposition planning  Patient is expressing interest in having a family meeting with her husband prior to discharge Medication management:   Craige CottaFernando A Kamoni Depree, MD 01/26/2017, 3:09 PM   Patient ID: [name], female   DOB: 08/28/1985, 31 y.o.   MRN: [id number]

## 2017-01-26 NOTE — Progress Notes (Signed)
Recreation Therapy Notes  Date: 01/26/17 Time: 0930 Location: [address] Dayroom  Group Topic: Stress Management  Goal Area(s) Addresses:  Patient will verbalize importance of using healthy stress management.  Patient will identify positive emotions associated with healthy stress management.   Intervention: Stress Management  Activity :  Peaceful Waves.  LRT introduced the stress management technique of guided imagery.  LRT read [name] script to allow patients the opportunity for take [name] mental journey to the beach.  Patients were to follow along as LRT read script to engage in the technique.  Education: Stress Management, Discharge Planning.   Education Outcome: Acknowledges edcuation/In group clarification offered/Needs additional education  Clinical Observations/Feedback: Pt did not attend group.   [name] [name], LRT/CTRS        [name] [name] 01/26/2017 1:07 PM 

## 2017-01-26 NOTE — Plan of Care (Signed)
Problem: Coping: Goal: Ability to cope will improve Outcome: Progressing Pt denies SI and verbally contracts for safety. Pt receptive to learning new ways to cope with depression. 1:1 support provided to pt.

## 2017-01-26 NOTE — Progress Notes (Signed)
Patient ID: [name] [name], female   DOB: 1986/02/22, 31 y.o.   MRN: [id number]  Pt currently presents with an anxious affect and depressed behavior. Pt reports that one of her goals is to "trust my decisions and do so by being patient with myself." Pt states "I had a better day today." Pt reports good sleep with current medication regimen. Pt does report some dizziness which she has discussed with  MD. Reports headache pain at 4/10 tonight.    Pt provided with as needed and scheduled medications per providers orders. Pt's labs and vitals were monitored throughout the night. Pt given a 1:1 about emotional and mental status. Pt supported and encouraged to express concerns and questions. Pt educated on medications and assertiveness.   Pt's safety ensured with 15 minute and environmental checks. Pt currently denies SI/HI and A/V hallucinations. Pt verbally agrees to seek staff if SI/HI or A/VH occurs and to consult with staff before acting on any harmful thoughts. Will continue POC.

## 2017-01-27 NOTE — Progress Notes (Signed)
Recreation Therapy Notes  Animal-Assisted Activity (AAA) Program Checklist/Progress Notes Patient Eligibility Criteria Checklist & Daily Group note for Rec TxIntervention  Date: 06.26.2018 Time: 2:45pm [facility]   AAA/T Program Assumption of Risk Form signed by Patient/ or Parent Legal Guardian Yes  Patient is free of allergies or sever asthma Yes  Patient reports no fear of animals Yes  Patient reports no history of cruelty to animals Yes  Patient understands his/her participation is voluntary Yes  Patient washes hands before animal contact Yes  Patient washes hands after animal contact Yes  Behavioral Response: Appropriate   Education:Hand Washing, Appropriate Animal Interaction   Education Outcome: Acknowledges education.   Clinical Observations/Feedback: Patient attended session and interacted appropriately with therapy dog and peers.  Marykay Lexenise L Ahna Konkle, LRT/CTRS        Torren Maffeo L 01/27/2017 3:07 PM

## 2017-01-27 NOTE — Progress Notes (Signed)
Adult Psychoeducational Group Note  Date:  01/27/2017 Time:  10:43 PM  Group Topic/Focus:  Wrap-Up Group:   The focus of this group is to help patients review their daily goal of treatment and discuss progress on daily workbooks.  Participation Level:  Active  Participation Quality:  Appropriate  Affect:  Appropriate  Cognitive:  Alert  Insight: Appropriate  Engagement in Group:  Engaged  Modes of Intervention:  Discussion  Additional Comments:  Patient stated having a good day. Patient's goal for today was to complete her suicide safety plan. Patient met goal. Patient stated she will be discharging tomorrow.   Rohaan Durnil L Numa Schroeter 01/27/2017, 10:43 PM

## 2017-01-27 NOTE — Progress Notes (Addendum)
[facility]  01/27/2017 12:41 PM [name] [name]  MRN:  [id number]  Subjective:  Patient reports ongoing improvement . Reports she had a good visit from husband yesterday evening . At this time denies suicidal or self injurious ideations . Denies medication side effects.   Objective: patient seen and case reviewed with staff . Patient reports overall improvement compared to admission. Reports improving mood , presents with improving range of affect, decreased severity of anxiety. States she continues to have panic type symptoms at times, but feels Klonopin PRNs have helped. Currently denies any suicidal ideations . She states that relationship with husband has improved and is hoping that they will be able to go for family therapy after discharge. Denies medication side effects. We have reviewed side effect profiles to include sedative and addictive properties of Klonopin- patient denies any history of misuse, uses only occasionally on PRN basis for anxiety. Visible on unit, going to groups. Interactive with peers, pleasant on approach, behavior on unit in good control.     Principal Problem: MDD (major depressive disorder), recurrent severe, without psychosis (HCC) Diagnosis:   Patient Active Problem List   Diagnosis Date Noted  . MDD (major depressive disorder), recurrent severe, without psychosis (HCC) [F33.2] 01/24/2017  . Suicidal ideation [R45.851] 01/24/2017  . MDD (major depressive disorder), recurrent episode (HCC) [F33.9] 01/23/2017   Total Time spent with patient: 20 minutes   Past Medical History:  Past Medical History:  Diagnosis Date  . Anxiety   . Depression   . GERD (gastroesophageal reflux disease)   . History of kidney stones     Past Surgical History:  Procedure Laterality Date  . CYSTOSCOPY WITH RETROGRADE PYELOGRAM, URETEROSCOPY AND STENT PLACEMENT Right 12/10/2016   Procedure: CYSTOSCOPY WITH RETROGRADE PYELOGRAM, URETEROSCOPY AND STONE REMOVAL;   Surgeon: Heloise PurpuraBorden, Lester, MD;  Location: [facility];  Service: Urology;  Laterality: Right;  . WISDOM TOOTH EXTRACTION  at age 31   Family History: History reviewed. No pertinent family history. Family Psychiatric  History: Pt reports there is a family history of mental illness which she states was not treated in her mom Social History:  History  Alcohol Use  . 0.6 oz/week  . 1 Glasses of wine per week    Comment: per week     History  Drug Use No    Social History   Social History  . Marital status: Married    Spouse name: N/A  . Number of children: N/A  . Years of education: N/A   Social History Main Topics  . Smoking status: Never Smoker  . Smokeless tobacco: Never Used  . Alcohol use 0.6 oz/week    1 Glasses of wine per week     Comment: per week  . Drug use: No  . Sexual activity: Yes    Birth control/ protection: IUD   Other Topics Concern  . None   Social History Narrative  . None   Additional Social History:    Pain Medications: denies Prescriptions: denies Over the Counter: denies History of alcohol / drug use?: No history of alcohol / drug abuse Longest period of sobriety (when/how long): denies Negative Consequences of Use:  (denies)    Sleep: improved   Appetite:  improved   Current Medications: Current Facility-Administered Medications  Medication Dose Route Frequency Provider Last Rate Last Dose  . acetaminophen (TYLENOL) tablet 650 mg  650 mg Oral Q6H PRN Oneta RackLewis, Tanika N, NP   650 mg at 01/26/17 2108  .  alum & mag hydroxide-simeth (MAALOX/MYLANTA) 200-200-20 MG/5ML suspension 30 mL  30 mL Oral Q4H PRN Oneta Rack, NP      . clonazePAM Scarlette Calico) tablet 0.5 mg  0.5 mg Oral BID PRN Nira Conn A, NP   0.5 mg at 01/23/17 2024  . escitalopram (LEXAPRO) tablet 20 mg  20 mg Oral QHS Izediuno, Delight Ovens, MD   20 mg at 01/26/17 2108  . magnesium hydroxide (MILK OF MAGNESIA) suspension 30 mL  30 mL Oral Daily PRN Oneta Rack, NP      . traZODone  (DESYREL) tablet 50 mg  50 mg Oral QHS PRN Oneta Rack, NP   50 mg at 01/23/17 2125  . triamcinolone 0.1 % cream : eucerin cream, 1:1   Topical BID Cobos, Rockey Situ, MD        Lab Results:  No results found for this or any previous visit (from the past 48 hour(s)).  Blood Alcohol level:  No results found for: [facility]  Metabolic Disorder Labs: Lab Results  Component Value Date   HGBA1C 5.0 01/24/2017   MPG 97 01/24/2017   No results found for: PROLACTIN Lab Results  Component Value Date   CHOL 175 01/24/2017   TRIG 171 (H) 01/24/2017   HDL 43 01/24/2017   CHOLHDL 4.1 01/24/2017   VLDL 34 01/24/2017   LDLCALC 98 01/24/2017    Physical Findings: AIMS: Facial and Oral Movements Muscles of Facial Expression: None, normal Lips and Perioral Area: None, normal Jaw: None, normal Tongue: None, normal,Extremity Movements Upper (arms, wrists, hands, fingers): None, normal Lower (legs, knees, ankles, toes): None, normal, Trunk Movements Neck, shoulders, hips: None, normal, Overall Severity Severity of abnormal movements (highest score from questions above): None, normal Incapacitation due to abnormal movements: None, normal Patient's awareness of abnormal movements (rate only patient's report): No Awareness, Dental Status Current problems with teeth and/or dentures?: No Does patient usually wear dentures?: No  CIWA:    COWS:     Musculoskeletal: Strength & Muscle Tone: within normal limits Gait & Station: normal Patient leans: N/A  Psychiatric Specialty Exam: Physical Exam  Nursing note and vitals reviewed. Constitutional: She is oriented to person, place, and time.  Neurological: She is alert and oriented to person, place, and time.    Review of Systems  Psychiatric/Behavioral: Positive for depression. Negative for hallucinations, memory loss, substance abuse and suicidal ideas. The patient is nervous/anxious. The patient does not have insomnia.   All other systems  reviewed and are negative. denies nausea or vomiting, no rash  Blood pressure (!) 138/96, pulse 90, temperature 98.2 F (36.8 C), temperature source Oral, resp. rate 16, height 5\' 4"  (1.626 m), weight 113.4 kg (250 lb).Body mass index is 42.91 kg/m.  General Appearance: Well Groomed  Eye Contact:  Good  Speech:  Normal Rate  Volume:  Normal  Mood:  continues to improve  Affect:  reactive, fuller in range   Thought Process:  Linear and Descriptions of Associations: Intact  Orientation:  Full (Time, Place, and Person)  Thought Content:  no hallucinations, no delusions, not internally preoccupied   Suicidal Thoughts:  No no suicidal or self injurious ideations , no homicidal or violent ideations   Homicidal Thoughts:  No  Memory:  Recent and remote grossly intact   Judgement:  Other:  improving   Insight:  improving   Psychomotor Activity:  Normal  Concentration:  Concentration: Good and Attention Span: Good  Recall:  Good  Fund of Knowledge:  Good  Language:  Good  Akathisia:  Negative  Handed:  Right  AIMS (if indicated):     Assets:  Desire for Improvement Intimacy Resilience  ADL's:  Intact  Cognition:  WNL  Sleep:  Number of Hours: 6.75   Assessment - patient is presenting with improving mood and range of affect. Less anxious. Denies SI at this time, and is future oriented . Currently tolerating medication ( Lexapro, Klonopin PRNs ) well .   Treatment Plan Summary: Treatment plan reviewed today 6/26 Daily contact with patient to assess and evaluate symptoms and progress in treatment  Encourage group and milieu participation to work on coping skills and symptom reduction  Continue Lexapro 20 mgrs QDAY for depression and anxiety Continue Trazodone 50 mgrs QHS PRN for insomnia Continue Klonopin 0.5 mgrs Q 12 hours PRN for anxiety Treatment team working on disposition planning - family meeting scheduled for tomorrow- likely discharge tomorrow if she continues to stabilize  and improve     Craige Cotta, MD 01/27/2017, 12:41 PM   Patient ID: [name] [name], female   DOB: 1986-05-05, 31 y.o.   MRN: [id number]

## 2017-01-27 NOTE — Progress Notes (Signed)
D: Patient is visile on the unit.  She is pleasant with staff and peers.  She worked on her Suicide Astronomerrevention Plan and returned it to staff.  She denies any thoughts of self harm toward herself or others.  She does not appear to be responding to internal stimuli.  Patient rates her depression and hopelessness as a 1; anxiety as a 2.she is sleeping and eating well; her energy level is normal and her concentration is good.  She is attending groups and participating in her treatment.   A: Continue to monitor medication management and MD orders.  Safety checks completed every 15 minutes per protocol.  Offer support and encouragement as needed. R: Patient is receptive to staff; her behavior is appropriate.

## 2017-01-27 NOTE — Plan of Care (Signed)
Problem: Medication: Goal: Compliance with prescribed medication regimen will improve Outcome: Progressing Pt is taking medications as prescribed.   

## 2017-01-27 NOTE — [facility] Group Notes (Signed)
[facility] LCSW Group Therapy 01/27/2017 1:15 PM  Type of Therapy: Group Therapy- Feelings about Diagnosis  Participation Level: Active   Participation Quality:  Appropriate  Affect:  Appropriate  Cognitive: Alert and Oriented   Insight:  Developing   Engagement in Therapy: Developing/Improving and Engaged   Modes of Intervention: Clarification, Confrontation, Discussion, Education, Exploration, Limit-setting, Orientation, Problem-solving, Rapport Building, Dance movement psychotherapisteality Testing, Socialization and Support  Description of Group:   This group will allow patients to explore their thoughts and feelings about diagnoses they have received. Patients will be guided to explore their level of understanding and acceptance of these diagnoses. Facilitator will encourage patients to process their thoughts and feelings about the reactions of others to their diagnosis, and will guide patients in identifying ways to discuss their diagnosis with significant others in their lives. This group will be process-oriented, with patients participating in exploration of their own experiences as well as giving and receiving support and challenge from other group members.  Summary of Progress/Problems:  Pt asked several questions about the symptoms and diagnositic criteria of her diagnosis. CSW provided psychoeducation and validated pt's feelings about receiving a mental health diagnosis. Pt states that she believes her mental health is something that she will have to work to manage for the rest of her life.  Therapeutic Modalities:   Cognitive Behavioral Therapy Solution Focused Therapy Motivational Interviewing Relapse Prevention Therapy  Donnelly StagerLynn Charita Lindenberger, MSW, LCSW 01/27/2017 4:20 PM

## 2017-01-27 NOTE — Progress Notes (Signed)
  DATA ACTION RESPONSE  Objective- Pt. is visible in the dayroom, seen interacting with peers. Presents with a depressed/anxious affect and mood. Subjective- Denies having any SI/HI/AVH at this time. Rates pain 5/10; headache. Pt. was concern on  increase dosage of Lexapro. Pt. states abnormal s/s of "feeling out of it", feeling "slower", and "loss of sexual drive". Is cooperative and remain safe on the unit.  1:1 interaction in private to establish rapport. Encouragement, education, & support given from staff.  PRN Tylenol requested and will re-eval accordingly.   Safety maintained with Q 15 checks. Continue with POC.

## 2017-01-28 ENCOUNTER — Encounter (HOSPITAL_COMMUNITY): Payer: Self-pay | Admitting: Behavioral Health

## 2017-01-28 MED ORDER — ESCITALOPRAM OXALATE 20 MG PO TABS: 20.0000 mg | ORAL_TABLET | Freq: Every day | ORAL | 0 refills | Status: DC

## 2017-01-28 MED ORDER — ESCITALOPRAM OXALATE 10 MG PO TABS: 10.0000 mg | ORAL_TABLET | Freq: Every day | ORAL | 0 refills | Status: DC

## 2017-01-28 MED ORDER — TRAZODONE HCL 50 MG PO TABS: 50.0000 mg | ORAL_TABLET | Freq: Every evening | ORAL | 0 refills | Status: DC | PRN

## 2017-01-28 MED ORDER — TRAZODONE HCL 50 MG PO TABS
50.0000 mg | ORAL_TABLET | Freq: Every evening | ORAL | 1 refills | Status: DC | PRN
Start: 2017-01-28 — End: 2019-03-02

## 2017-01-28 MED ORDER — ESCITALOPRAM OXALATE 10 MG PO TABS
10.0000 mg | ORAL_TABLET | Freq: Every day | ORAL | Status: DC
  Filled 2017-01-28: qty 1

## 2017-01-28 MED ORDER — ESCITALOPRAM OXALATE 10 MG PO TABS: 10.0000 mg | ORAL_TABLET | Freq: Every day | ORAL | 1 refills | Status: DC

## 2017-01-28 NOTE — Progress Notes (Signed)
D: Pt presents with flat affect. Pt verbalizes feeling slow, lacking pleasure/decrease sex drive and not feeling like her usual self due to taking increase dose of Lexapro. Pt requesting to have Lexapro decreased from 20 mg to 15 mg. Pt stated that she went from taking Lexapro 5 mg to 10 mg to 20 mg. Pt feeling depressed and anxious related to increased dose of Lexapro. Pt denies feeling dizzy today compared to Monday  c/o dizziness after taking Lexapro. Pt denies SI/HI. Pt requesting to speak with CSW about setting up an appt with a psychiatrist and therapist. Treatment team with MD and CSW notified of pt complaints and request. A: Mediations reviewed with pt. Medications offered as ordered per MD. One on one support provided. Pt encouraged to attend groups. 15 minute checks performed for safety. R: Pt receptive to tx.
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
## 2017-01-28 NOTE — Progress Notes (Signed)
  [facility] :  Will you be returning to the same living situation after discharge:  Yes,  pt returning home. At discharge, do you have transportation home?: Yes,  pt's husband providing transportation. Do you have the ability to pay for your medications: Yes,  pt has insurance.  Release of information consent forms completed and in the chart;  Patient's signature needed at discharge.  Patient to Follow up at: Follow-up Information    [facility] Follow up on 02/03/2017.   Why:  Therapy appointment with Colin InaMissy Reid @ 10am. Please call the office before your appointment to pay a 20 dollar deposit. If deposit is not made, the appointment will be cancelled. Thank you. Contact information: [address] [place] [address] [phone]        [facility] Follow up.   Specialty:  Behavioral Health Why:  Social worker made a referral on your behalf. If you decide that you would like to start the Intensive Outpatient Program instead, please call the office number listed to schedule an assessment with [place] [facility]. Contact information: [address] [place] [id number] [phone]       [facility] Follow up on 02/23/2017.   Why:  Medication management appointment at 10:15am with Vikki PortsValerie.  Contact information: [address] [place], [place] [address] P: [phone] F: [phone]          Next level of care provider has [facility] to [facility] Link:no  Safety Planning and Suicide Prevention discussed: Yes,  with pt.  Have you used any form of tobacco in the last 30 days? (Cigarettes, Smokeless Tobacco, Cigars, and/or Pipes): No  Has patient been referred to the Quitline?: N/A patient is not a smoker  Patient has been referred for addiction treatment: N/A  [name] JordanLynn B [name], MSW, LCSWA 01/28/2017, 3:37 PM

## 2017-01-28 NOTE — Progress Notes (Signed)
Discharge D- Patient verbalizes readiness for discharge: Denies SI/HI, is not psychotic or delusional. A- Discharge instructions read and discussed with patient.  All belongings returned to patient. R- Patient cooperative with discharge process.  Patient verbalizes understanding of discharge instructions.  Signed for return of belongings. Escorted to the lobby.

## 2017-01-28 NOTE — Progress Notes (Signed)
Recreation Therapy Notes  Date: 01/28/17 Time: 0930 Location: 300 Hall Dayroom  Group Topic: Stress Management  Goal Area(s) Addresses:  Patient will verbalize importance of using healthy stress management.  Patient will identify positive emotions associated with healthy stress management.   Intervention: Stress Management  Activity :  Meditation.  LRT introduced the stress management technique of meditation.  LRT played [name] meditation from the calm app to get patients engaged in the technique.  Patients were to follow along as the meditation played to participate.  Education:  Stress Management, Discharge Planning.   Education Outcome: Acknowledges edcuation/In group clarification offered/Needs additional education  Clinical Observations/Feedback: Pt did not attend group.   Jorge Retz, LRT/CTRS         [name] [name] 01/28/2017 11:34 AM 

## 2017-01-28 NOTE — [facility] Suicide Risk Assessment (Signed)
[facility] Discharge Suicide Risk Assessment   Principal Problem: MDD (major depressive disorder), recurrent severe, without psychosis (HCC) Discharge Diagnoses:  Patient Active Problem List   Diagnosis Date Noted  . MDD (major depressive disorder), recurrent severe, without psychosis (HCC) [F33.2] 01/24/2017  . Suicidal ideation [R45.851] 01/24/2017  . MDD (major depressive disorder), recurrent episode (HCC) [F33.9] 01/23/2017    Total Time spent with patient: 30 minutes  Musculoskeletal: Strength & Muscle Tone: within normal limits Gait & Station: normal Patient leans: N/A  Psychiatric Specialty Exam: ROS denies chest pain, no shortness of breath, no vomiting, no fever, no chills   Blood pressure (!) 137/95, pulse (!) 101, temperature 98.3 F (36.8 C), resp. rate 18, height 5\' 4"  (1.626 m), weight 113.4 kg (250 lb).Body mass index is 42.91 kg/m.  General Appearance: Well Groomed  Eye Contact::  Good  Speech:  Normal Rate409  Volume:  Normal  Mood:  reports mood is improved , currently feeling better   Affect:  appropriate, reactive   Thought Process:  Linear and Descriptions of Associations: Intact  Orientation:  Full (Time, Place, and Person)  Thought Content:  denies hallucinations, no delusions,not internally preoccupied   Suicidal Thoughts:  No- denies suicidal or self injurious ideations, denies any violent or homicidal ideations  Homicidal Thoughts:  No  Memory:  recent and remote grossly intact   Judgement:  Other:  improving   Insight:  improving   Psychomotor Activity:  Normal  Concentration:  Good  Recall:  Good  Fund of Knowledge:Good  Language: Good  Akathisia:  Negative  Handed:  Right  AIMS (if indicated):     Assets:  Communication Skills Desire for Improvement Resilience  Sleep:  Number of Hours: 6.75  Cognition: WNL  ADL's:  Intact   Mental Status Per Nursing Assessment::   On Admission:  Suicidal ideation indicated by patient  Demographic Factors:   31 year old married female  Loss Factors: Marital strain with recent separation ( now back together)   Historical Factors: History of depression  Risk Reduction Factors:   Sense of responsibility to family, Living with another person, especially a relative, Positive social support and Positive coping skills or problem solving skills  Continued Clinical Symptoms:  At this time patient presents improved compared to her admission presentation. Presents with improving mood and range of affect , at this time affect bright, reactive, no thought disorder, no suicidal or self injurious ideations, no homicidal or violent ideations, no hallucinations, no delusions, not internally preoccupied, future oriented . Patient has requested to decrease Lexapro dose back to 10 mgrs daily due to concerns that higher doses are associated with sexual side effects. Behavior on unit calm and in good control, pleasant on approach. Prior to discharge we had a family meeting with patient, her husband, CSW, and myself. Husband reiterated support for patient, and corroborated patient seems significantly improved and agrees with discharge. They acknowledged recent marital issues that had led to brief separation, but stressed that marital relationship now much improved .   Cognitive Features That Contribute To Risk:  No gross cognitive deficits noted upon discharge. Is alert , attentive, and oriented x 3   Suicide Risk:  Mild:  Suicidal ideation of limited frequency, intensity, duration, and specificity.  There are no identifiable plans, no associated intent, mild dysphoria and related symptoms, good self-control (both objective and subjective assessment), few other risk factors, and identifiable protective factors, including available and accessible social support.  Follow-up Information    [facility] Follow up on 02/03/2017.   Why:  Therapy appointment with Colin InaMissy Reid @ 10am. Please call the office before  your appointment to pay a 20 dollar deposit. If deposit is not made, the appointment will be cancelled. Thank you. Contact information: [address] [place] [address] [phone]        [facility] Follow up.   Specialty:  Behavioral Health Why:  Social worker made a referral and is awaiting time and date of appointment. Intensive outpatient assessment appointment at . If you need to cancel/reschdule please call the office to do so. Contact information: [address] [place] [phone]          Plan Of Care/Follow-up recommendations:  Activity:  as tolerated Diet:  Regular Tests:  NA Other:  See below  Patient reports feeling ready for discharge- she is leaving unit in good spirits Patient is returning home Plans to follow up as above   Craige CottaFernando A Cobos, MD 01/28/2017, 12:07 PM

## 2017-02-09 ENCOUNTER — Other Ambulatory Visit (HOSPITAL_COMMUNITY): Payer: BLUE CROSS/BLUE SHIELD | Attending: Psychiatry | Admitting: Psychiatry

## 2017-02-09 ENCOUNTER — Encounter (HOSPITAL_COMMUNITY): Payer: Self-pay | Admitting: Psychiatry

## 2017-02-09 DIAGNOSIS — K219 Gastro-esophageal reflux disease without esophagitis: Secondary | ICD-10-CM | POA: Diagnosis not present

## 2017-02-09 DIAGNOSIS — F411 Generalized anxiety disorder: Secondary | ICD-10-CM | POA: Insufficient documentation

## 2017-02-09 DIAGNOSIS — F329 Major depressive disorder, single episode, unspecified: Secondary | ICD-10-CM | POA: Diagnosis present

## 2017-02-09 DIAGNOSIS — F339 Major depressive disorder, recurrent, unspecified: Secondary | ICD-10-CM | POA: Insufficient documentation

## 2017-02-09 DIAGNOSIS — Z79899 Other long term (current) drug therapy: Secondary | ICD-10-CM | POA: Diagnosis not present

## 2017-02-09 DIAGNOSIS — Z888 Allergy status to other drugs, medicaments and biological substances status: Secondary | ICD-10-CM | POA: Diagnosis not present

## 2017-02-09 DIAGNOSIS — Z87442 Personal history of urinary calculi: Secondary | ICD-10-CM | POA: Diagnosis not present

## 2017-02-09 DIAGNOSIS — Z9889 Other specified postprocedural states: Secondary | ICD-10-CM | POA: Insufficient documentation

## 2017-02-09 DIAGNOSIS — Z818 Family history of other mental and behavioral disorders: Secondary | ICD-10-CM | POA: Insufficient documentation

## 2017-02-09 DIAGNOSIS — F330 Major depressive disorder, recurrent, mild: Secondary | ICD-10-CM
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
## 2017-02-10 ENCOUNTER — Other Ambulatory Visit (HOSPITAL_COMMUNITY): Payer: BLUE CROSS/BLUE SHIELD | Admitting: Psychiatry

## 2017-02-10 DIAGNOSIS — F332 Major depressive disorder, recurrent severe without psychotic features: Secondary | ICD-10-CM

## 2017-02-10 DIAGNOSIS — F339 Major depressive disorder, recurrent, unspecified: Secondary | ICD-10-CM | POA: Diagnosis not present

## 2017-02-10 NOTE — Progress Notes (Signed)
Psychiatric Initial Adult Assessment   Patient Identification: [name] [name] MRN:  [id number] Date of Evaluation:  02/10/2017 Referral Source: follow up after discharge from [facility] Chief Complaint:depression and anxiety persist  Visit Diagnosis: severe major depression, recurrent without psychosis.  Generalized anxiety disorder.  Rule out personality disorder  History of Present Illness:  [name] [name] has been aware of her depression since about aged 31.  She grew up in a very rigid evangelical family who adopted 8 children in addition to their 3 natural children and most of them had special needs.  She felt the 3 children were not enough for her parents.  They were home schooled as well.  She got pregnant in her mid teens and was not allowed to have an abortion but gave up the baby to adoption.  She left to live with her boyfriend when she was able to and was disowned by the family ever since.  Her current stressors are relationship with her husband whose patience with her is running out and he has tried to separate more than once and she might want to separate herself but financially cannot afford to.  She was working excessively and trying to go to school but after inpatient will not go to school and try to control her work hours.  Finances are a stress.  Medications have never been much help.  Associated Signs/Symptoms: Depression Symptoms:  depressed mood, anhedonia, insomnia, hypersomnia, fatigue, feelings of worthlessness/guilt, difficulty concentrating, hopelessness, anxiety, (Hypo) Manic Symptoms:  Irritable Mood, Anxiety Symptoms:  Excessive Worry, Psychotic Symptoms:  none PTSD Symptoms: not an issue currently  Past Psychiatric History: no inpatient but years of outpatient therapy and medication  Previous Psychotropic Medications: Yes   Substance Abuse History in the last 12 months:  No.  Consequences of Substance Abuse: Negative  Past Medical History:  Past  Medical History:  Diagnosis Date  . Anxiety   . Depression   . GERD (gastroesophageal reflux disease)   . History of kidney stones     Past Surgical History:  Procedure Laterality Date  . CYSTOSCOPY WITH RETROGRADE PYELOGRAM, URETEROSCOPY AND STENT PLACEMENT Right 12/10/2016   Procedure: CYSTOSCOPY WITH RETROGRADE PYELOGRAM, URETEROSCOPY AND STONE REMOVAL;  Surgeon: Heloise Purpura, MD;  Location: [facility];  Service: Urology;  Laterality: Right;  . WISDOM TOOTH EXTRACTION  at age 11    Family Psychiatric History: none reported  Family History:  Family History  Problem Relation Age of Onset  . Depression Mother   . Anxiety disorder Mother     Social History:   Social History   Social History  . Marital status: Married    Spouse name: N/A  . Number of children: N/A  . Years of education: N/A   Social History Main Topics  . Smoking status: Never Smoker  . Smokeless tobacco: Never Used  . Alcohol use 0.6 oz/week    1 Glasses of wine per week     Comment: per week  . Drug use: No  . Sexual activity: Yes    Birth control/ protection: IUD   Other Topics Concern  . Not on file   Social History Narrative  . No narrative on file    Additional Social History: none  Allergies:   Allergies  Allergen Reactions  . Wellbutrin [Bupropion] Hives    Metabolic Disorder Labs: Lab Results  Component Value Date   HGBA1C 5.0 01/24/2017   MPG 97 01/24/2017   No results found for: PROLACTIN Lab Results  Component  Value Date   CHOL 175 01/24/2017   TRIG 171 (H) 01/24/2017   HDL 43 01/24/2017   CHOLHDL 4.1 01/24/2017   VLDL 34 01/24/2017   LDLCALC 98 01/24/2017     Current Medications: Current Outpatient Prescriptions  Medication Sig Dispense Refill  . cholecalciferol (VITAMIN D) 1000 units tablet Take 1,000 Units by mouth at bedtime.    . cyanocobalamin 1000 MCG tablet Take 1,000 mcg by mouth at bedtime.     Marland Kitchen. escitalopram (LEXAPRO) 10 MG tablet Take 1 tablet (10 mg  total) by mouth at bedtime. For depression 30 tablet 1  . fluticasone (FLONASE) 50 MCG/ACT nasal spray Place 1 spray into both nostrils at bedtime.    Marland Kitchen. levonorgestrel (MIRENA) 20 MCG/24HR IUD 1 each by Intrauterine route once.    . loratadine (CLARITIN) 10 MG tablet Take 10 mg by mouth at bedtime.    . traZODone (DESYREL) 50 MG tablet Take 1 tablet (50 mg total) by mouth at bedtime as needed for sleep. 30 tablet 1   No current facility-administered medications for this visit.     Neurologic: Headache: Negative Seizure: Negative Paresthesias:Negative  Musculoskeletal: Strength & Muscle Tone: within normal limits Gait & Station: normal Patient leans: N/A  Psychiatric Specialty Exam: ROS  There were no vitals taken for this visit.There is no height or weight on file to calculate BMI.  General Appearance: Fairly Groomed  Eye Contact:  Good  Speech:  Clear and Coherent  Volume:  Normal  Mood:  Anxious and Depressed  Affect:  Congruent  Thought Process:  Coherent and Goal Directed  Orientation:  Full (Time, Place, and Person)  Thought Content:  Logical  Suicidal Thoughts:  No  Homicidal Thoughts:  No  Memory:  Immediate;   Good Recent;   Good Remote;   Good  Judgement:  Fair  Insight:  Fair  Psychomotor Activity:  Normal  Concentration:  Concentration: Good and Attention Span: Good  Recall:  Good  Fund of Knowledge:Good  Language: Good  Akathisia:  Negative  Handed:  Right  AIMS (if indicated):  0  Assets:  Communication Skills Desire for Improvement Financial Resources/Insurance Housing Physical Health Talents/Skills Transportation Vocational/Educational  ADL's:  Intact  Cognition: WNL  Sleep:  erratic    Treatment Plan Summary: Admit ot IOP with daily group therapy.  Continue escitalppram 10 mg daily.  20 mg had intolerable side effects Continue trazodone 50 mg hs   Carolanne GrumblingGerald Joleen Stuckert, MD 7/10/20182:14 PM

## 2017-02-11 ENCOUNTER — Other Ambulatory Visit (HOSPITAL_COMMUNITY): Payer: BLUE CROSS/BLUE SHIELD | Admitting: Psychiatry

## 2017-02-11 DIAGNOSIS — F332 Major depressive disorder, recurrent severe without psychotic features: Secondary | ICD-10-CM

## 2017-02-11 DIAGNOSIS — F339 Major depressive disorder, recurrent, unspecified: Secondary | ICD-10-CM | POA: Diagnosis not present

## 2017-02-12 ENCOUNTER — Other Ambulatory Visit (HOSPITAL_COMMUNITY): Payer: BLUE CROSS/BLUE SHIELD | Admitting: Psychiatry

## 2017-02-12 DIAGNOSIS — F332 Major depressive disorder, recurrent severe without psychotic features: Secondary | ICD-10-CM

## 2017-02-12 DIAGNOSIS — F339 Major depressive disorder, recurrent, unspecified: Secondary | ICD-10-CM | POA: Diagnosis not present

## 2017-02-12 NOTE — Progress Notes (Signed)
    Daily Group Progress Note  Program: IOP  Group Time: 9:00-12:00  Participation Level: Active  Behavioral Response: Appropriate  Type of Therapy:  Group Therapy  Summary of Progress:  Pt. Presents as talkative, engaged in the group process. Pt. Discussed her process of reversing her thinking about self-harming behavior. Pt. Discussed interactions with her therapist who explained to her that the decision to engage in the behavior was her choice. Pt. Was able to develop her awareness that having a sense of control over her recovery. Pt. Participated in presentations on grief and loss with the Chaplain and presentation by mental health association.       Shaune PollackBrown, Jennifer B, LPC

## 2017-02-12 NOTE — Progress Notes (Signed)
    Daily Group Progress Note  Program: IOP  Group Time: 9:00-12:00  Participation Level: Active  Behavioral Response: Appropriate  Type of Therapy:  Group Therapy  Summary of Progress: Pt.'s first day in group. Pt. Participated in medication management education group with the pharmacist. Pt. Introduced herself to the group. Pt. Discussed her anger towards her husband. Counselor challenged Pt. Around anger toward herself for allowing her personal boundaries to be crossed. Pt. Restated that her anger was specifically toward her husband.      Shaune PollackBrown, Ashea Winiarski B, LPC

## 2017-02-12 NOTE — Progress Notes (Signed)
    Daily Group Progress Note  Program: IOP  Group Time: 9:00-12:00  Participation Level: Active  Behavioral Response: Appropriate  Type of Therapy:  Group Therapy  Summary of Progress: Pt. Presents as talkative, engaged in the group process. Pt. Shared her ongoing anger towards her husband, dissatisfaction in her marriage. Pt. Also discussed generally feeling low energy, recognizing that this is a part of her depression, and not feeling supported by her husband in her recovery.     Shaune PollackBrown, Jennifer B, LPC

## 2017-02-12 NOTE — Progress Notes (Signed)
    Daily Group Progress Note  Program: IOP  Group Time: 9:00-12:00  Participation Level: Active  Behavioral Response: Appropriate  Type of Therapy:  Group Therapy  Summary of Progress: Pt. Reported to the group that she felt "confused", "disgusted", and "optimistic". Pt. Shared with the group that she has received feedback from online friends that her marriage is not healthy for her and she is coming to terms that the relationship is not good for her. Pt. Reported that despite this realization, she feels hopeful about her future.     Shaune PollackBrown, Jennifer B, LPC

## 2017-02-13 ENCOUNTER — Telehealth (HOSPITAL_COMMUNITY): Payer: Self-pay | Admitting: Psychiatry

## 2017-02-13 ENCOUNTER — Other Ambulatory Visit (HOSPITAL_COMMUNITY): Payer: BLUE CROSS/BLUE SHIELD | Admitting: Psychiatry

## 2017-02-16 ENCOUNTER — Other Ambulatory Visit (HOSPITAL_COMMUNITY): Payer: BLUE CROSS/BLUE SHIELD | Admitting: *Deleted

## 2017-02-16 NOTE — Progress Notes (Deleted)
Daily Group Progress Note  Program: IOP  02/16/2017   Group Time: 9:00 - 10:30 AM   Participation Level: Active  Behavioral Response: Appropriate  Type of Therapy:  Psycho-education Group  Summary of Progress:  Patient was attentive to psycho- education group on pharmacology.      Group Time: 10:30 - 12 PM  Participation Level:  Active  Behavioral Response: Attentive and Sharing  Type of Therapy: Process Group  Summary of Progress: Patients had opportunity to process their feelings and behavioral responses to current life situation. Patient shared stuggles with current behaviors and processed her feelings re diagnosis.   Carney Bernatherine C Josey Dettmann, LCSW

## 2017-02-17 ENCOUNTER — Other Ambulatory Visit (HOSPITAL_COMMUNITY): Payer: BLUE CROSS/BLUE SHIELD | Admitting: *Deleted

## 2017-02-17 DIAGNOSIS — F339 Major depressive disorder, recurrent, unspecified: Secondary | ICD-10-CM | POA: Diagnosis not present

## 2017-02-17 DIAGNOSIS — F332 Major depressive disorder, recurrent severe without psychotic features: Secondary | ICD-10-CM

## 2017-02-17 NOTE — Progress Notes (Signed)
   Daily Group Progress Note 02/17/2017  Program: IOP  Group Time:  9:00 - 10:30 AM  Participation Level: Minimal  Behavioral Response: Evasive  Type of Therapy:  Process Group  Summary of Progress: Group members were able to process their feelings about current situation and health concerns. Patient shared little and avoided eye contact with others.     Group Time:  10:30 - 12 PM  Participation Level:  Active  Behavioral Response: Appropriate, Sharing and Motivated  Type of Therapy: Process Group  Summary of Progress:  Group members continued to process their experiences with additional focus on Wheel of Life handout.  Many were able to recognize how their life is currently unbalanced and what ares of life they may choose to put more or less energy into. Patient shared  difficult experience from earlier in the day and was able to assure group she will follow up with her individual therapist in order to come up with a plan of action.   Carney Bernatherine C Harrill, LCSW

## 2017-02-18 ENCOUNTER — Other Ambulatory Visit (HOSPITAL_COMMUNITY): Payer: BLUE CROSS/BLUE SHIELD | Admitting: *Deleted

## 2017-02-18 DIAGNOSIS — F332 Major depressive disorder, recurrent severe without psychotic features: Secondary | ICD-10-CM

## 2017-02-18 DIAGNOSIS — F330 Major depressive disorder, recurrent, mild: Secondary | ICD-10-CM

## 2017-02-18 DIAGNOSIS — F339 Major depressive disorder, recurrent, unspecified: Secondary | ICD-10-CM | POA: Diagnosis not present

## 2017-02-18 NOTE — Progress Notes (Signed)
    Daily Group Progress Note 02/18/2017  Program: IOP  Group Time: 9 AM - 10:30 AM  Participation Level: Active  Behavioral Response: Appropriate and Attentive  Type of Therapy:  Process Group  Summary of Progress: Patient's processed thoughts and feelings related to a quote about sharing/not sharing joys and pains. Patient participated significantly in discussion and shared experience with learning whom to trust.      Group Time:  10:30 AM - 12 PM  Participation Level:  Active  Behavioral Response: Appropriate, Sharing and Assertive  Type of Therapy: Psycho-education Group  Summary of Progress: Brief presentation delivered by facilitator on topic of 'self care' and what that might look like for different individuals. Patients had opportunity to share with others their experience with self care. Patient shared her self care experience and results over last 24 hours with group members and received support of group. Patient's awareness acknowledgement of self is increasing.   Carney Bernatherine C My Mad[place]aga, LCSW

## 2017-02-19 ENCOUNTER — Other Ambulatory Visit (HOSPITAL_COMMUNITY): Payer: BLUE CROSS/BLUE SHIELD | Admitting: *Deleted

## 2017-02-19 DIAGNOSIS — F339 Major depressive disorder, recurrent, unspecified: Secondary | ICD-10-CM | POA: Diagnosis not present

## 2017-02-19 DIAGNOSIS — F332 Major depressive disorder, recurrent severe without psychotic features: Secondary | ICD-10-CM

## 2017-02-19 NOTE — Progress Notes (Signed)
   Daily Group Progress Note 02/19/2017  Program: IOP  Group Time:  9:00 - 11:00 AM  Participation Level: Active  Behavioral Response: Appropriate, Sharing and Attentive  Type of Therapy:  Process Group  Summary of Progress:  Patients shared their feelings related to their current diagnosis, relationship issues and group. Patient processed recent changes in her relationship and offered thoughtful feedback to others.      Group Time:  11:00 AM - 12:00 PM  Participation Level:  Active  Behavioral Response: Appropriate  Type of Therapy: Activity Group  Summary of Progress:  Patients had opportunity to participate in yoga activity which focused on breathing and centering.   Carney Bernatherine C Harrill, LCSW

## 2017-02-20 ENCOUNTER — Other Ambulatory Visit (HOSPITAL_COMMUNITY): Payer: BLUE CROSS/BLUE SHIELD | Admitting: Psychiatry

## 2017-02-20 DIAGNOSIS — F332 Major depressive disorder, recurrent severe without psychotic features: Secondary | ICD-10-CM

## 2017-02-20 DIAGNOSIS — F339 Major depressive disorder, recurrent, unspecified: Secondary | ICD-10-CM | POA: Diagnosis not present

## 2017-02-20 NOTE — Progress Notes (Signed)
    Daily Group Progress Note  Program: IOP  Group Time: 9:00-12:00  Participation Level: Active  Behavioral Response: Appropriate  Type of Therapy:  Group Therapy  Summary of Progress: Pt. Presents as talkative, engaged in the group process, bright affect. Pt. Discussed decision to separate from her husband, acknowledging grief process and sadness related to ending the relationship. Pt. Discussed goal for the weekend was to clean up her apartment. Pt. Discussed that her animals are an ongoing source of support for her.     Shaune PollackBrown, Jennifer B, LPC

## 2017-02-23 ENCOUNTER — Other Ambulatory Visit (HOSPITAL_COMMUNITY): Payer: BLUE CROSS/BLUE SHIELD

## 2017-02-24 ENCOUNTER — Other Ambulatory Visit (HOSPITAL_COMMUNITY): Payer: BLUE CROSS/BLUE SHIELD | Admitting: Psychiatry

## 2017-02-24 DIAGNOSIS — F332 Major depressive disorder, recurrent severe without psychotic features: Secondary | ICD-10-CM

## 2017-02-24 DIAGNOSIS — F339 Major depressive disorder, recurrent, unspecified: Secondary | ICD-10-CM | POA: Diagnosis not present

## 2017-02-24 NOTE — Progress Notes (Signed)
    Daily Group Progress Note  Program: IOP  Group Time: 9:00-12:00  Participation Level: Active  Behavioral Response: Appropriate  Type of Therapy:  Group Therapy  Summary of Progress: Pt. Presents with brightened affect, talkative, engaged in the group process. Pt. Reports that she is feeling better. Pt. Reports that she has developed awareness regarding co-dependent patterns in her relationship. Pt. Reports that she is talking to estranged spouse, but has gained clarity about her boundaries and what she needs to do for herself. Pt. Participated in grief and loss session facilitated by the Chaplain.     Shaune PollackBrown, Jennifer B, LPC

## 2017-02-25 ENCOUNTER — Other Ambulatory Visit (HOSPITAL_COMMUNITY): Payer: BLUE CROSS/BLUE SHIELD

## 2017-02-26 ENCOUNTER — Other Ambulatory Visit (HOSPITAL_COMMUNITY): Payer: BLUE CROSS/BLUE SHIELD | Admitting: Psychiatry

## 2017-02-26 DIAGNOSIS — F339 Major depressive disorder, recurrent, unspecified: Secondary | ICD-10-CM | POA: Diagnosis not present

## 2017-02-26 DIAGNOSIS — F332 Major depressive disorder, recurrent severe without psychotic features: Secondary | ICD-10-CM

## 2017-02-26 NOTE — Progress Notes (Signed)
    Daily Group Progress Note  Program: IOP  Group Time: 9:00-12:00  Participation Level: Active  Behavioral Response: Appropriate  Type of Therapy:  Group Therapy  Summary of Progress: Pt. Presents with significantly brightened affect, talkative, engaged in the group process. Pt. Shared that she continues to struggle with depressed days and fears of never getting better. Pt. Received feedback from the counselor and the group about normalizing bad days and acceptance of them as a part of the recovery process. Pt. Expressed her fears of leaving the group and was provided reassurance and reminders of the significant progress that she has made during her time in group such as setting healthy boundaries with her husband and deciding to pursue her health and personal goals. Pt. Participated in discussion facilitated by the mental health association about accessing community resources.      Shaune PollackBrown, Cleon Thoma B, LPC

## 2017-02-27 ENCOUNTER — Other Ambulatory Visit (HOSPITAL_COMMUNITY): Payer: BLUE CROSS/BLUE SHIELD | Admitting: Psychiatry

## 2017-02-27 DIAGNOSIS — F339 Major depressive disorder, recurrent, unspecified: Secondary | ICD-10-CM | POA: Diagnosis not present

## 2017-02-27 DIAGNOSIS — F332 Major depressive disorder, recurrent severe without psychotic features: Secondary | ICD-10-CM

## 2017-02-27 NOTE — Progress Notes (Signed)
    Daily Group Progress Note  Program: IOP  Group Time: 9:00-12:00  Participation Level: Active  Behavioral Response: Appropriate  Type of Therapy:  Group Therapy  Summary of Progress: Pt. Continues to present as talkative, engaged in the group process, brightened affect. Pt.shared with the group that she continues to work with her husband to establish new and healthy relationship boundaries and communicate her expectations as they work to rebuild the relationship. Pt. Participated in discussion about stress management and use of the grounding series to help with insomnia, anxiety and depression.      Shaune PollackBrown, Jennifer B, LPC

## 2017-03-02 ENCOUNTER — Other Ambulatory Visit (HOSPITAL_COMMUNITY): Payer: BLUE CROSS/BLUE SHIELD | Admitting: Psychiatry

## 2017-03-02 DIAGNOSIS — F332 Major depressive disorder, recurrent severe without psychotic features: Secondary | ICD-10-CM

## 2017-03-02 DIAGNOSIS — F339 Major depressive disorder, recurrent, unspecified: Secondary | ICD-10-CM | POA: Diagnosis not present

## 2017-03-02 NOTE — Progress Notes (Signed)
    Daily Group Progress Note  Program: IOP  Group Time: 9:00-12:00  Participation Level: Active  Behavioral Response: Appropriate  Type of Therapy:  Group Therapy  Summary of Progress: Pt. Presents as talkative, engaged in the group process. Pt. Continues to process recent changes in her marriage, her desire to focus on herself and understanding her emotional needs and communicating them effectively to her partner. Pt. processed concerns about allowing herself to return to the relationship too soon before clarifying her personal boundaries due to fears of losing her partner. Pt. Participated in medication management education group with the pharmacist.     Shaune PollackBrown, Anival Pasha B, [facility]

## 2017-03-03 ENCOUNTER — Other Ambulatory Visit (HOSPITAL_COMMUNITY): Payer: BLUE CROSS/BLUE SHIELD | Admitting: Psychiatry

## 2017-03-03 DIAGNOSIS — F339 Major depressive disorder, recurrent, unspecified: Secondary | ICD-10-CM | POA: Diagnosis not present

## 2017-03-03 DIAGNOSIS — F332 Major depressive disorder, recurrent severe without psychotic features: Secondary | ICD-10-CM

## 2017-03-03 NOTE — Progress Notes (Signed)
[facility] IOP DISCHARGE NOTE  Patient:  [name] [name] DOB:  06-23-1986  Date of Admission: 02/10/2017  Date of Discharge:03/03/2017  Reason for Admission:depression and admission  IOP Course:attended and participated.  Seemed to benefit from the coping skills and the support of the group.  Has separated from her husband and has a plan to seek out new supports, work less, schedule time with a DBT group and follow up with her psychiatrist and therapist.  Melida GimenezUneasy about being alone but believes it is the right thing to do for now. Mental Status at Discharge:no current suicidal thoughts  Diagnosis: major depression, recurrent severe without psychosis.  Generalized anxiety disorder  Level of Care:  IOP  Discharge destination: has appointments with her psychiatrist and therapist     Comments:  none  The patient received suicide prevention pamphlet:  Yes   Carolanne GrumblingGerald Taylor, MD Patient ID: [name] [name], female   DOB: 06-23-1986, 31 y.o.   MRN: [id number]

## 2017-03-03 NOTE — Progress Notes (Signed)
    Daily Group Progress Note  Program: IOP  Group Time: 9:00-12:00  Participation Level: Active  Behavioral Response: Appropriate  Type of Therapy:  Group Therapy  Summary of Progress: Pt. Presents as calm, brightened affect, engaged in the group process. Pt. Prepared for discharge with the case manage and psychiatrist. Pt. Continues to process needs for self-care and developing appropriate boundaries with there husband who she is currently separated from. Pt. Participated in grief and loss with the Chaplain.      Shaune PollackBrown, Jennifer B, LPC

## 2017-03-03 NOTE — Patient Instructions (Signed)
D:  Patient successfully completed MH-IOP today.  A:  Follow up with Colin InaMissy [name], LPC on 03-09-17 and Lauren, NP on 03-31-17.  Encouraged support groups.  Strongly recommend DBT groups.  R:  Pt receptive.

## 2017-03-03 NOTE — Progress Notes (Signed)
[name] [name] is a 31 y.o. , married, employed, Caucasian female who was [facility]; treatment for worsening depressive symptoms with intermittent SI (plan to cut wrist or OD on sleeping pills).  DIscussed safety options at length with pt.  Pt was able to contract for safety.  Still reports having the urge to cut her wrists or having the compulsion to pick at her skin.  Reported one previous suicide attempt 2-3 yrs ago (OD on Xanax, along with drinking ETOH).  No prior psychiatric hospitalizations except for [facility] (01-23-17 thru 01-28-17).  Hx of seeing Secundino GingerLeslie O'Neill, NP for two yrs and [facility] Riley[facility].  States she left the both of them and is with [facility] (Missy Azucena K[facility]Reid, [facility] is the therapist and has upcoming new psychiatrist appt on 02-23-17.  Stressors:  1)  Marriage of 5 1/2 yrs.  States they had separated in April, but he has now moved in with her.  According to pt, husband isn't being supportive like he used to be.  "He says he is tired of being my caretaker."  According to pt, she texted husband a pic of her wrist, in which she cut on purpose.  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  Has decreased her hours due to the stress.  Pt states she dropped out of school due to finances.  Is angry with husband because this was during the time they separated.  3)  Family Conflict:  According to pt, parents disowned her in 2011.  "My parents are very religious and couldn't get pass me moving out of the home to move in with my then boyfriend, who is now my husband."  Pt states she is even estranged from her two biological siblings.  Family hx:  Mother (Depression and anxiety)  Pt completed MH-IOP today.  States the groups were helpful.  Working on building her support system.  Pt states she needs her space from husband; therefore he has left  the home.  They are doing one hour daily visitations.  According to pt, that is working very well.  Reports she is still wanting a divorce.  Pt has quit one job [employer]); but is still out on leave from [employer].  Plans to return once she is put back on the schedule.  Pt is interested in DBT group, [facility].  A:  D/C today.  F/U with Colin InaMissy Reid, LPC on 03-09-17 and Lauren, NP on 03-31-17 at [facility].  Encouraged support groups.  Highly recommend DBT.  R:  Pt receptive.                                                                             Chestine SporeLARK, RITA, M.Ed, CNA

## 2017-03-04 ENCOUNTER — Other Ambulatory Visit (HOSPITAL_COMMUNITY): Payer: BLUE CROSS/BLUE SHIELD

## 2017-03-05 ENCOUNTER — Other Ambulatory Visit (HOSPITAL_COMMUNITY): Payer: BLUE CROSS/BLUE SHIELD

## 2017-03-06 ENCOUNTER — Other Ambulatory Visit (HOSPITAL_COMMUNITY): Payer: BLUE CROSS/BLUE SHIELD

## 2017-03-09 ENCOUNTER — Other Ambulatory Visit (HOSPITAL_COMMUNITY): Payer: BLUE CROSS/BLUE SHIELD

## 2017-03-10 ENCOUNTER — Other Ambulatory Visit (HOSPITAL_COMMUNITY): Payer: BLUE CROSS/BLUE SHIELD

## 2017-03-11 ENCOUNTER — Other Ambulatory Visit (HOSPITAL_COMMUNITY): Payer: BLUE CROSS/BLUE SHIELD

## 2017-03-12 ENCOUNTER — Other Ambulatory Visit (HOSPITAL_COMMUNITY): Payer: BLUE CROSS/BLUE SHIELD

## 2017-03-13 ENCOUNTER — Other Ambulatory Visit (HOSPITAL_COMMUNITY): Payer: BLUE CROSS/BLUE SHIELD

## 2017-03-16 ENCOUNTER — Other Ambulatory Visit (HOSPITAL_COMMUNITY): Payer: BLUE CROSS/BLUE SHIELD

## 2017-03-17 ENCOUNTER — Other Ambulatory Visit (HOSPITAL_COMMUNITY): Payer: BLUE CROSS/BLUE SHIELD

## 2017-03-18 ENCOUNTER — Other Ambulatory Visit (HOSPITAL_COMMUNITY): Payer: BLUE CROSS/BLUE SHIELD

## 2017-03-19 ENCOUNTER — Other Ambulatory Visit (HOSPITAL_COMMUNITY): Payer: BLUE CROSS/BLUE SHIELD

## 2017-03-20 ENCOUNTER — Other Ambulatory Visit (HOSPITAL_COMMUNITY): Payer: BLUE CROSS/BLUE SHIELD

## 2017-03-23 ENCOUNTER — Other Ambulatory Visit (HOSPITAL_COMMUNITY): Payer: BLUE CROSS/BLUE SHIELD

## 2017-03-24 ENCOUNTER — Other Ambulatory Visit (HOSPITAL_COMMUNITY): Payer: BLUE CROSS/BLUE SHIELD

## 2017-03-25 ENCOUNTER — Other Ambulatory Visit (HOSPITAL_COMMUNITY): Payer: BLUE CROSS/BLUE SHIELD

## 2017-03-26 ENCOUNTER — Other Ambulatory Visit (HOSPITAL_COMMUNITY): Payer: BLUE CROSS/BLUE SHIELD

## 2017-03-27 ENCOUNTER — Other Ambulatory Visit (HOSPITAL_COMMUNITY): Payer: BLUE CROSS/BLUE SHIELD

## 2017-03-30 ENCOUNTER — Other Ambulatory Visit (HOSPITAL_COMMUNITY): Payer: BLUE CROSS/BLUE SHIELD

## 2017-03-31 ENCOUNTER — Other Ambulatory Visit (HOSPITAL_COMMUNITY): Payer: BLUE CROSS/BLUE SHIELD

## 2017-04-01 ENCOUNTER — Other Ambulatory Visit (HOSPITAL_COMMUNITY): Payer: BLUE CROSS/BLUE SHIELD

## 2017-04-02 ENCOUNTER — Other Ambulatory Visit (HOSPITAL_COMMUNITY): Payer: BLUE CROSS/BLUE SHIELD

## 2017-04-03 ENCOUNTER — Other Ambulatory Visit (HOSPITAL_COMMUNITY): Payer: BLUE CROSS/BLUE SHIELD

## 2017-04-12 ENCOUNTER — Encounter (HOSPITAL_COMMUNITY): Payer: Self-pay | Admitting: Emergency Medicine

## 2017-04-12 ENCOUNTER — Emergency Department (HOSPITAL_COMMUNITY): Payer: Self-pay

## 2017-04-12 DIAGNOSIS — R21 Rash and other nonspecific skin eruption: Secondary | ICD-10-CM | POA: Insufficient documentation

## 2017-04-12 DIAGNOSIS — F419 Anxiety disorder, unspecified: Secondary | ICD-10-CM | POA: Insufficient documentation

## 2017-04-12 DIAGNOSIS — Z79899 Other long term (current) drug therapy: Secondary | ICD-10-CM | POA: Insufficient documentation

## 2017-04-12 DIAGNOSIS — F329 Major depressive disorder, single episode, unspecified: Secondary | ICD-10-CM | POA: Insufficient documentation

## 2017-04-12 LAB — BASIC METABOLIC PANEL
Anion gap: 9 (ref 5–15)
BUN: 6 mg/dL (ref 6–20)
CALCIUM: 8.9 mg/dL (ref 8.9–10.3)
CHLORIDE: 103 mmol/L (ref 101–111)
CO2: 23 mmol/L (ref 22–32)
CREATININE: 0.69 mg/dL (ref 0.44–1.00)
GFR calc non Af Amer: >60 mL/min (ref >60)
Glucose, Bld: 116 mg/dL — ABNORMAL HIGH (ref 65–99)
Potassium: 3.9 mmol/L (ref 3.5–5.1)
Sodium: 135 mmol/L (ref 135–145)

## 2017-04-12 LAB — CBC
HCT: 39.5 % (ref 36.0–46.0)
Hemoglobin: 13.1 g/dL (ref 12.0–15.0)
MCH: 30 pg (ref 26.0–34.0)
MCHC: 33.2 g/dL (ref 30.0–36.0)
MCV: 90.4 fL (ref 78.0–100.0)
PLATELETS: 324 10*3/uL (ref 150–400)
RBC: 4.37 MIL/uL (ref 3.87–5.11)
RDW: 13.1 % (ref 11.5–15.5)
WBC: 8.9 10*3/uL (ref 4.0–10.5)

## 2017-04-12 LAB — I-STAT TROPONIN, ED: Troponin i, poc: 0 ng/mL (ref 0.00–0.08)

## 2017-04-12 IMAGING — CR DG CHEST 2V
2 series · 2 of 2 positions shown · non-contrast
Comparison: None.

CLINICAL DATA: Upper chest tightness tonight.

EXAM:
CHEST  2 VIEW

[chest pa]
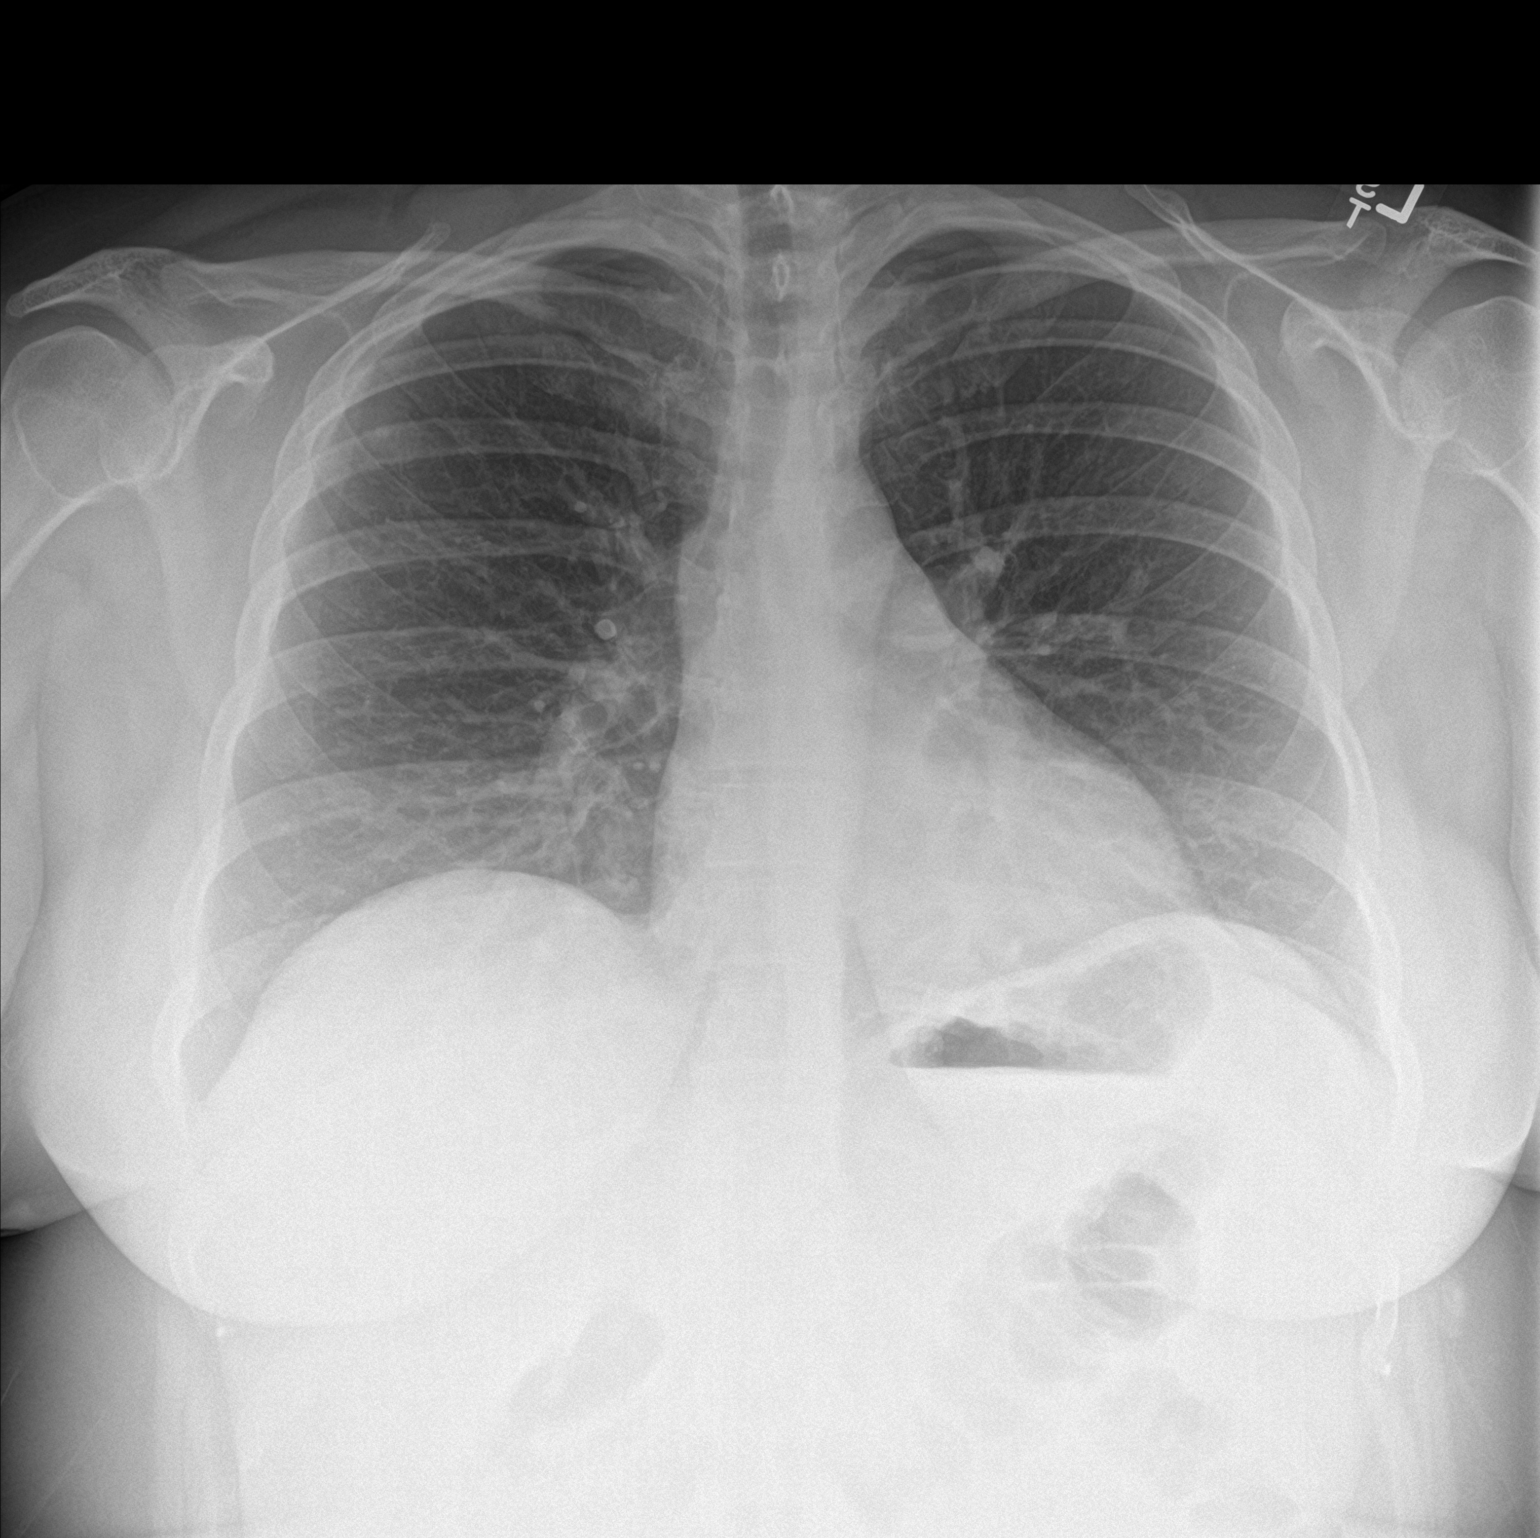

[chest lat]
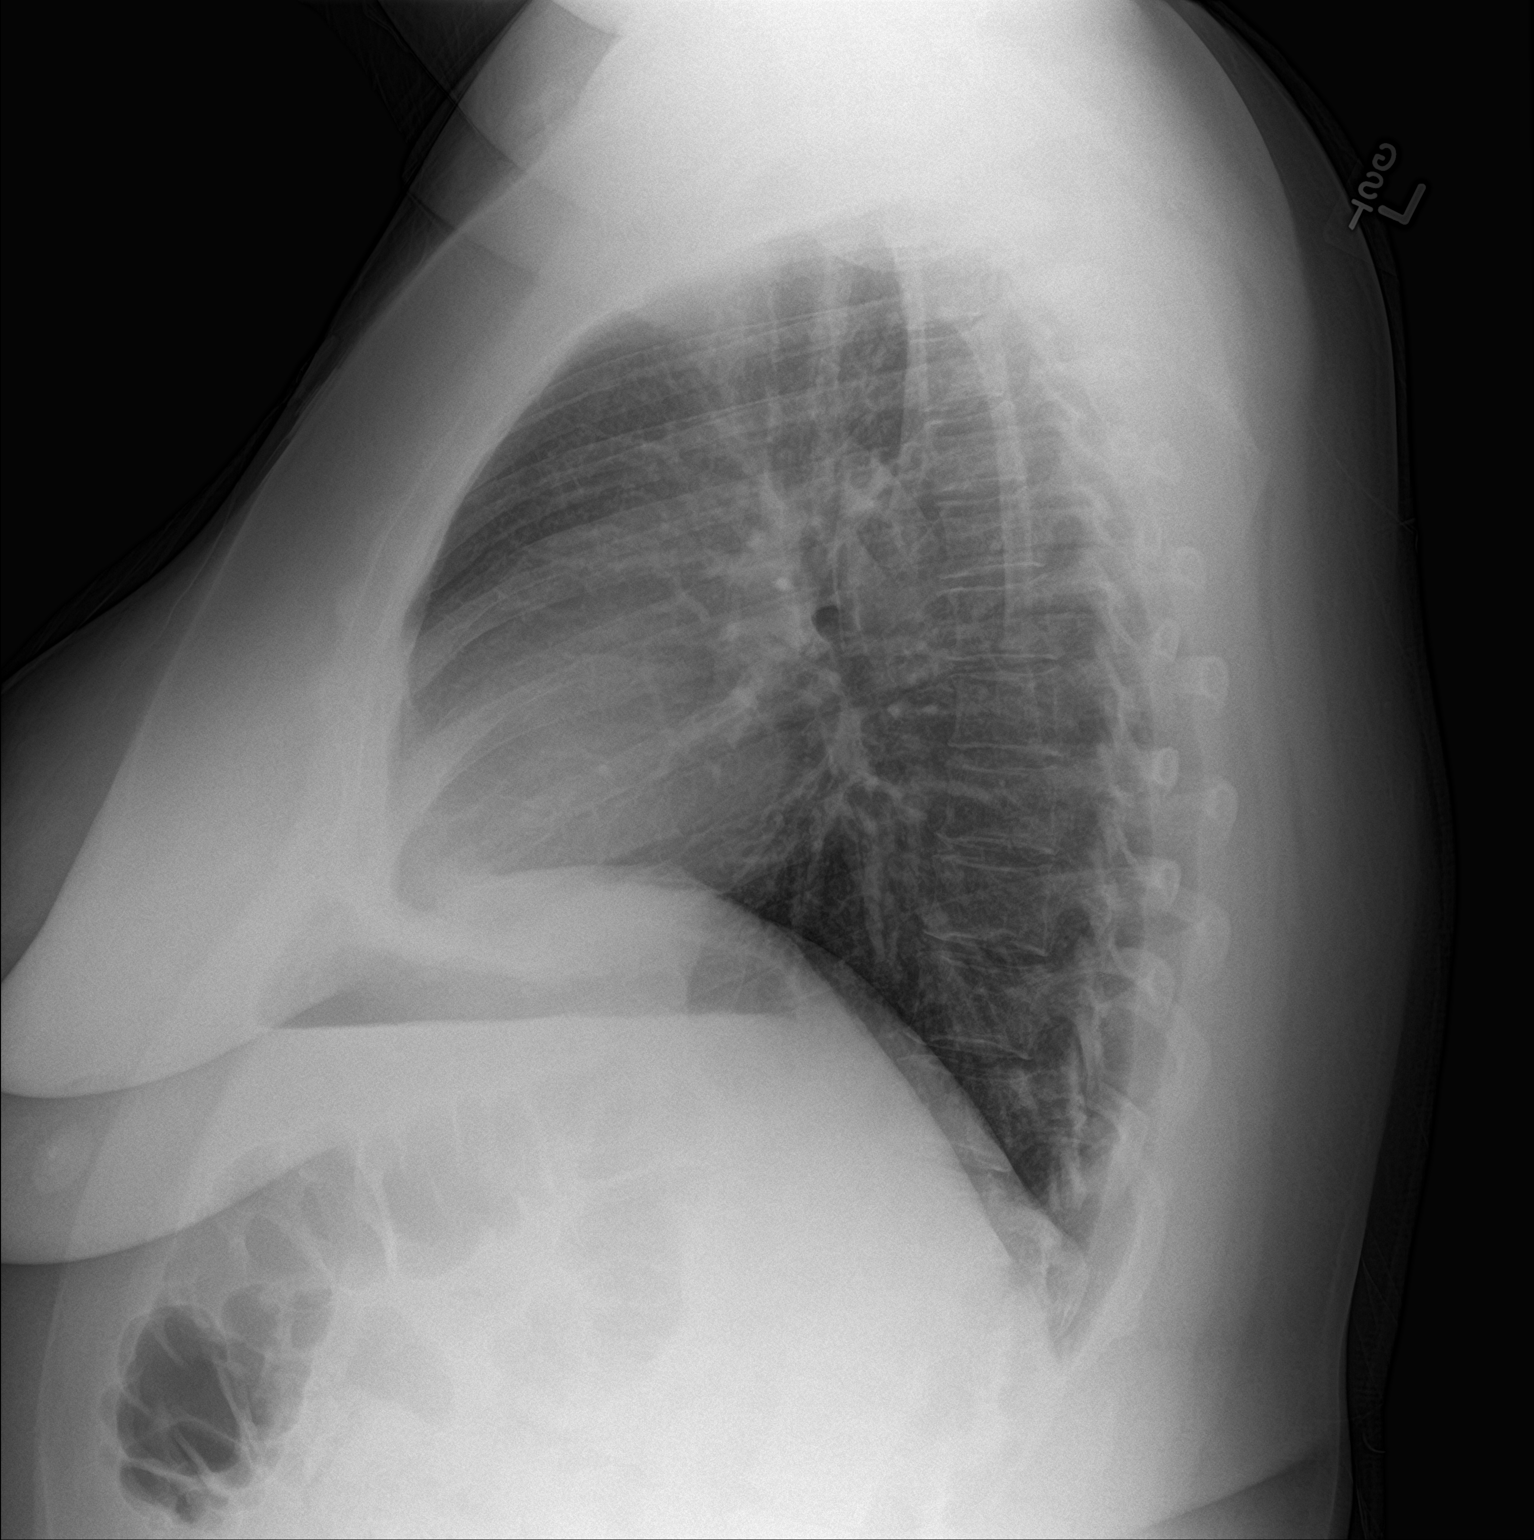

[2 of 2 positions shown; findings below may reference images not displayed]

FINDINGS: The lungs are clear. Heart size is normal. No pneumothorax or
pleural fluid. No bony abnormality.
IMPRESSION: Normal chest.

## 2017-04-12 NOTE — ED Triage Notes (Signed)
Initially came in c/o allergic reaction.  States my throat is tight and i'm itching all over.  These symptoms started today.  As triage continued started c/o chest tightness since yesterday.  Reports having a panic attack last night.  Breathing easy and non labored in triage.  No angio edema noted.  Red splotches noted to ble but no rash seen.  Reports being started on lamictal 6-8 weeks ago.  Unsure if this is the cause of symptoms or not.

## 2017-04-13 ENCOUNTER — Emergency Department (HOSPITAL_COMMUNITY)
Admission: EM | Admit: 2017-04-13 | Discharge: 2017-04-13 | Disposition: A | Discharge status: Home / Self Care | Payer: Self-pay | Attending: Emergency Medicine | Admitting: Emergency Medicine

## 2017-04-13 DIAGNOSIS — F419 Anxiety disorder, unspecified: Secondary | ICD-10-CM

## 2017-04-13 DIAGNOSIS — R21 Rash and other nonspecific skin eruption: Secondary | ICD-10-CM

## 2017-04-13 LAB — URINALYSIS, ROUTINE W REFLEX MICROSCOPIC
Bilirubin Urine: NEGATIVE
Glucose, UA: NEGATIVE mg/dL
Hgb urine dipstick: NEGATIVE
Ketones, ur: NEGATIVE mg/dL
LEUKOCYTES UA: NEGATIVE
Nitrite: NEGATIVE
PROTEIN: NEGATIVE mg/dL
SPECIFIC GRAVITY, URINE: 1.004 — AB (ref 1.005–1.030)
pH: 5 (ref 5.0–8.0)

## 2017-04-13 MED ORDER — HYDROXYZINE HCL 25 MG PO TABS
25.0000 mg | ORAL_TABLET | Freq: Once | ORAL | Status: AC
  Administered 2017-04-13: 25 mg via ORAL
  Filled 2017-04-13: qty 1

## 2017-04-13 MED ORDER — FAMOTIDINE 20 MG PO TABS
20.0000 mg | ORAL_TABLET | Freq: Once | ORAL | Status: AC
  Administered 2017-04-13: 20 mg via ORAL
  Filled 2017-04-13: qty 1

## 2017-04-13 MED ORDER — DIPHENHYDRAMINE HCL 25 MG PO CAPS
25.0000 mg | ORAL_CAPSULE | Freq: Once | ORAL | Status: AC
  Administered 2017-04-13: 25 mg via ORAL
  Filled 2017-04-13: qty 1

## 2017-04-13 MED ORDER — HYDROXYZINE HCL 25 MG PO TABS: 25.0000 mg | ORAL_TABLET | Freq: Four times a day (QID) | ORAL | 0 refills | Status: DC

## 2017-04-13 NOTE — ED Provider Notes (Signed)
[facility]-EMERGENCY DEPT Provider Note   CSN: [id number] Arrival date & time: 04/12/17  2243     History   Chief Complaint Chief Complaint  Patient presents with  . Allergic Reaction    HPI [name] [name] is a 31 y.o. female.  Patient presents to the ED with a chief complaint of itching and body aches.  She states that the symptoms started yesterday.  She reports that she has a history of anxiety and depression, and had a major depressive episode yesterday.  She states that she called a teledoc and also described feeling itchy and that her throat was tight and she was advised to come to the ED for evaluation.  She states that she has had no new contacts to soaps, detergents, or medications.  She has tried taking some benadryl, but with no relief.     The history is provided by the patient. No language interpreter was used.    Past Medical History:  Diagnosis Date  . Anxiety   . Depression   . GERD (gastroesophageal reflux disease)   . History of kidney stones     Patient Active Problem List   Diagnosis Date Noted  . MDD (major depressive disorder), recurrent severe, without psychosis (HCC) 01/24/2017  . Suicidal ideation 01/24/2017  . MDD (major depressive disorder), recurrent episode (HCC) 01/23/2017    Past Surgical History:  Procedure Laterality Date  . CYSTOSCOPY WITH RETROGRADE PYELOGRAM, URETEROSCOPY AND STENT PLACEMENT Right 12/10/2016   Procedure: CYSTOSCOPY WITH RETROGRADE PYELOGRAM, URETEROSCOPY AND STONE REMOVAL;  Surgeon: Heloise PurpuraBorden, Lester, MD;  Location: [facility];  Service: Urology;  Laterality: Right;  . WISDOM TOOTH EXTRACTION  at age 31    OB History    No data available       Home Medications    Prior to Admission medications   Medication Sig Start Date End Date Taking? Authorizing Provider  cholecalciferol (VITAMIN D) 1000 units tablet Take 1,000 Units by mouth at bedtime.    [provider]  cyanocobalamin 1000 MCG tablet Take 1,000 mcg by  mouth at bedtime.     [provider]  escitalopram (LEXAPRO) 10 MG tablet Take 1 tablet (10 mg total) by mouth at bedtime. For depression 01/28/17   Denzil Magnusonhomas, Lashunda, NP  fluticasone [facility](FLONASE) 50 MCG/ACT nasal spray Place 1 spray into both nostrils at bedtime.    [provider]  levonorgestrel (MIRENA) 20 MCG/24HR IUD 1 each by Intrauterine route once.    [provider]  loratadine (CLARITIN) 10 MG tablet Take 10 mg by mouth at bedtime.    [provider]  traZODone (DESYREL) 50 MG tablet Take 1 tablet (50 mg total) by mouth at bedtime as needed for sleep. 01/28/17   Denzil Magnusonhomas, Lashunda, NP    Family History Family History  Problem Relation Age of Onset  . Depression Mother   . Anxiety disorder Mother     Social History Social History  Substance Use Topics  . Smoking status: Never Smoker  . Smokeless tobacco: Never Used  . Alcohol use 0.6 oz/week    1 Glasses of wine per week     Comment: per week     Allergies   Wellbutrin [bupropion]   Review of Systems Review of Systems  All other systems reviewed and are negative.    Physical Exam Updated Vital Signs BP 124/72 (BP Location: Left Arm)   Pulse 96   Temp 98.3 F (36.8 C) (Oral)   Resp 18   Ht 5'  4" (1.626 m)   Wt 117.9 kg (260 lb)   SpO2 100%   BMI 44.63 kg/m   Physical Exam  Constitutional: She is oriented to person, place, and time. She appears well-developed and well-nourished.  HENT:  Head: Normocephalic and atraumatic.  Oropharynx is clear  No stridor Normal phonation  Eyes: Pupils are equal, round, and reactive to light. Conjunctivae and EOM are normal.  Neck: Normal range of motion. Neck supple.  Cardiovascular: Normal rate and regular rhythm.  Exam reveals no gallop and no friction rub.   No murmur heard. Pulmonary/Chest: Effort normal and breath sounds normal. No respiratory distress. She has no wheezes. She has no rales. She exhibits no tenderness.  CTAB    Abdominal: Soft. Bowel sounds are normal. She exhibits no distension and no mass. There is no tenderness. There is no rebound and no guarding.  Musculoskeletal: Normal range of motion. She exhibits no edema or tenderness.  Neurological: She is alert and oriented to person, place, and time.  Skin: Skin is warm and dry. Rash noted. There is erythema.  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 Judgment and thought content normal.  anxious  Nursing note and vitals reviewed.    ED Treatments / Results  Labs (all labs ordered are listed, but only abnormal results are displayed) Labs Reviewed  BASIC METABOLIC PANEL - Abnormal; Notable for the following:       Result Value   Glucose, Bld 116 (*)    All other components within normal limits  CBC  URINALYSIS, ROUTINE W REFLEX MICROSCOPIC  I-STAT TROPONIN, ED    EKG  EKG Interpretation None       Radiology Dg Chest 2 View  Result Date: 04/12/2017 CLINICAL DATA:  Upper chest tightness tonight. EXAM: CHEST  2 VIEW COMPARISON:  None. FINDINGS: The lungs are clear. Heart size is normal. No pneumothorax or pleural fluid. No bony abnormality. IMPRESSION: Normal chest. Electronically Signed   By: Drusilla Kanner M.D.   On: 04/12/2017 23:20    Procedures Procedures (including critical care time)  Medications Ordered in ED Medications  famotidine (PEPCID) tablet 20 mg (not administered)  diphenhydrAMINE (BENADRYL) capsule 25 mg (not administered)  hydrOXYzine (ATARAX/VISTARIL) tablet 25 mg (not administered)     Initial Impression / Assessment and Plan / ED Course  I have reviewed the triage vital signs and the nursing notes.  Pertinent labs & imaging results that were available during my care of the patient were reviewed by me and considered in my medical decision making (see chart for  details).     Patient with concerns about allergic reaction, her symptoms however seem to be more consistent with anxiety.  I see no evidence of dangerous allergic reaction.  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  She denies any fevers.  She has tried taking some benadryl with no relief.  Her airway is clear.  She has no evidence of anaphylactic reaction.  Will give some atarax, benadryl, and pepcid.  Will reassess.  Labs and imaging ordered in triage are normal. Will add UA.  UA is clear.  DC to home. NSAIDs and PCP follow-up.  Final Clinical Impressions(s) / ED Diagnoses   Final diagnoses:  Anxiousness  Rash    New Prescriptions New Prescriptions   No medications on file     [place],  Molly Maduro, PA-C 04/13/17 1191    Derwood Kaplan, MD 04/16/17 [phone]

## 2017-04-13 NOTE — ED Notes (Signed)
Pt reports that the symptoms "might be my anxiety acting up."

## 2017-04-13 NOTE — ED Notes (Signed)
Pt understood dc material. NAD noted. Scripts given at dc 

## 2018-10-15 ENCOUNTER — Other Ambulatory Visit: Payer: Self-pay

## 2018-10-15 ENCOUNTER — Encounter (HOSPITAL_COMMUNITY): Payer: Self-pay | Admitting: Emergency Medicine

## 2018-10-15 ENCOUNTER — Emergency Department (HOSPITAL_COMMUNITY)
Admission: EM | Admit: 2018-10-15 | Discharge: 2018-10-16 | Disposition: A | Discharge status: Home / Self Care | Payer: BLUE CROSS/BLUE SHIELD | Attending: Emergency Medicine | Admitting: Emergency Medicine

## 2018-10-15 DIAGNOSIS — B349 Viral infection, unspecified: Secondary | ICD-10-CM | POA: Insufficient documentation

## 2018-10-15 DIAGNOSIS — Z79899 Other long term (current) drug therapy: Secondary | ICD-10-CM | POA: Diagnosis not present

## 2018-10-15 DIAGNOSIS — J189 Pneumonia, unspecified organism: Secondary | ICD-10-CM | POA: Diagnosis not present

## 2018-10-15 DIAGNOSIS — R509 Fever, unspecified: Secondary | ICD-10-CM | POA: Diagnosis present

## 2018-10-15 LAB — I-STAT BETA HCG BLOOD, ED (MC, WL, AP ONLY): I-stat hCG, quantitative: <5 m[IU]/mL (ref <5)

## 2018-10-15 LAB — URINALYSIS, ROUTINE W REFLEX MICROSCOPIC
Bacteria, UA: NONE SEEN
Bilirubin Urine: NEGATIVE
Glucose, UA: NEGATIVE mg/dL
Hgb urine dipstick: NEGATIVE
Ketones, ur: NEGATIVE mg/dL
Nitrite: NEGATIVE
Protein, ur: NEGATIVE mg/dL
Specific Gravity, Urine: 1.004 — ABNORMAL LOW (ref 1.005–1.030)
pH: 6 (ref 5.0–8.0)

## 2018-10-15 LAB — CBC
HCT: 38.8 % (ref 36.0–46.0)
Hemoglobin: 12.7 g/dL (ref 12.0–15.0)
MCH: 30.5 pg (ref 26.0–34.0)
MCHC: 32.7 g/dL (ref 30.0–36.0)
MCV: 93.3 fL (ref 80.0–100.0)
PLATELETS: 283 10*3/uL (ref 150–400)
RBC: 4.16 MIL/uL (ref 3.87–5.11)
RDW: 13.7 % (ref 11.5–15.5)
WBC: 5.6 10*3/uL (ref 4.0–10.5)
nRBC: 0 % (ref 0.0–0.2)

## 2018-10-15 MED ORDER — ONDANSETRON 4 MG PO TBDP: 4.0000 mg | ORAL_TABLET | Freq: Once | ORAL | Status: DC | PRN

## 2018-10-15 MED ORDER — SODIUM CHLORIDE 0.9% FLUSH
3.0000 mL | Freq: Once | INTRAVENOUS | Status: AC
  Administered 2018-10-15: 3 mL via INTRAVENOUS

## 2018-10-15 NOTE — ED Triage Notes (Signed)
Patient complaining of body aches, fever, non productive cough, nauseated. Patient states that she has not been out of [place]. Patient has been sick since Tuesday. Patient states it feels like her food wants to come up when she eats but it doesn't.

## 2018-10-16 ENCOUNTER — Emergency Department (HOSPITAL_COMMUNITY): Payer: BLUE CROSS/BLUE SHIELD

## 2018-10-16 LAB — COMPREHENSIVE METABOLIC PANEL
ALBUMIN: 4 g/dL (ref 3.5–5.0)
ALK PHOS: 70 U/L (ref 38–126)
ALT: 14 U/L (ref 0–44)
AST: 21 U/L (ref 15–41)
Anion gap: 8 (ref 5–15)
BUN: 8 mg/dL (ref 6–20)
CALCIUM: 8.5 mg/dL — AB (ref 8.9–10.3)
CO2: 22 mmol/L (ref 22–32)
CREATININE: 0.8 mg/dL (ref 0.44–1.00)
Chloride: 104 mmol/L (ref 98–111)
Glucose, Bld: 108 mg/dL — ABNORMAL HIGH (ref 70–99)
Potassium: 3.9 mmol/L (ref 3.5–5.1)
Sodium: 134 mmol/L — ABNORMAL LOW (ref 135–145)
TOTAL PROTEIN: 6.7 g/dL (ref 6.5–8.1)
Total Bilirubin: 0.4 mg/dL (ref 0.3–1.2)

## 2018-10-16 LAB — LIPASE, BLOOD: LIPASE: 30 U/L (ref 11–51)

## 2018-10-16 IMAGING — CR CHEST - 2 VIEW
2 series · 2 of 2 positions shown · non-contrast
Comparison: [DATE]

CLINICAL DATA: Body aches, fever, nonproductive cough, nausea. Six
since [REDACTED]. No recent travel.

EXAM:
CHEST - 2 VIEW

[w chest pa]
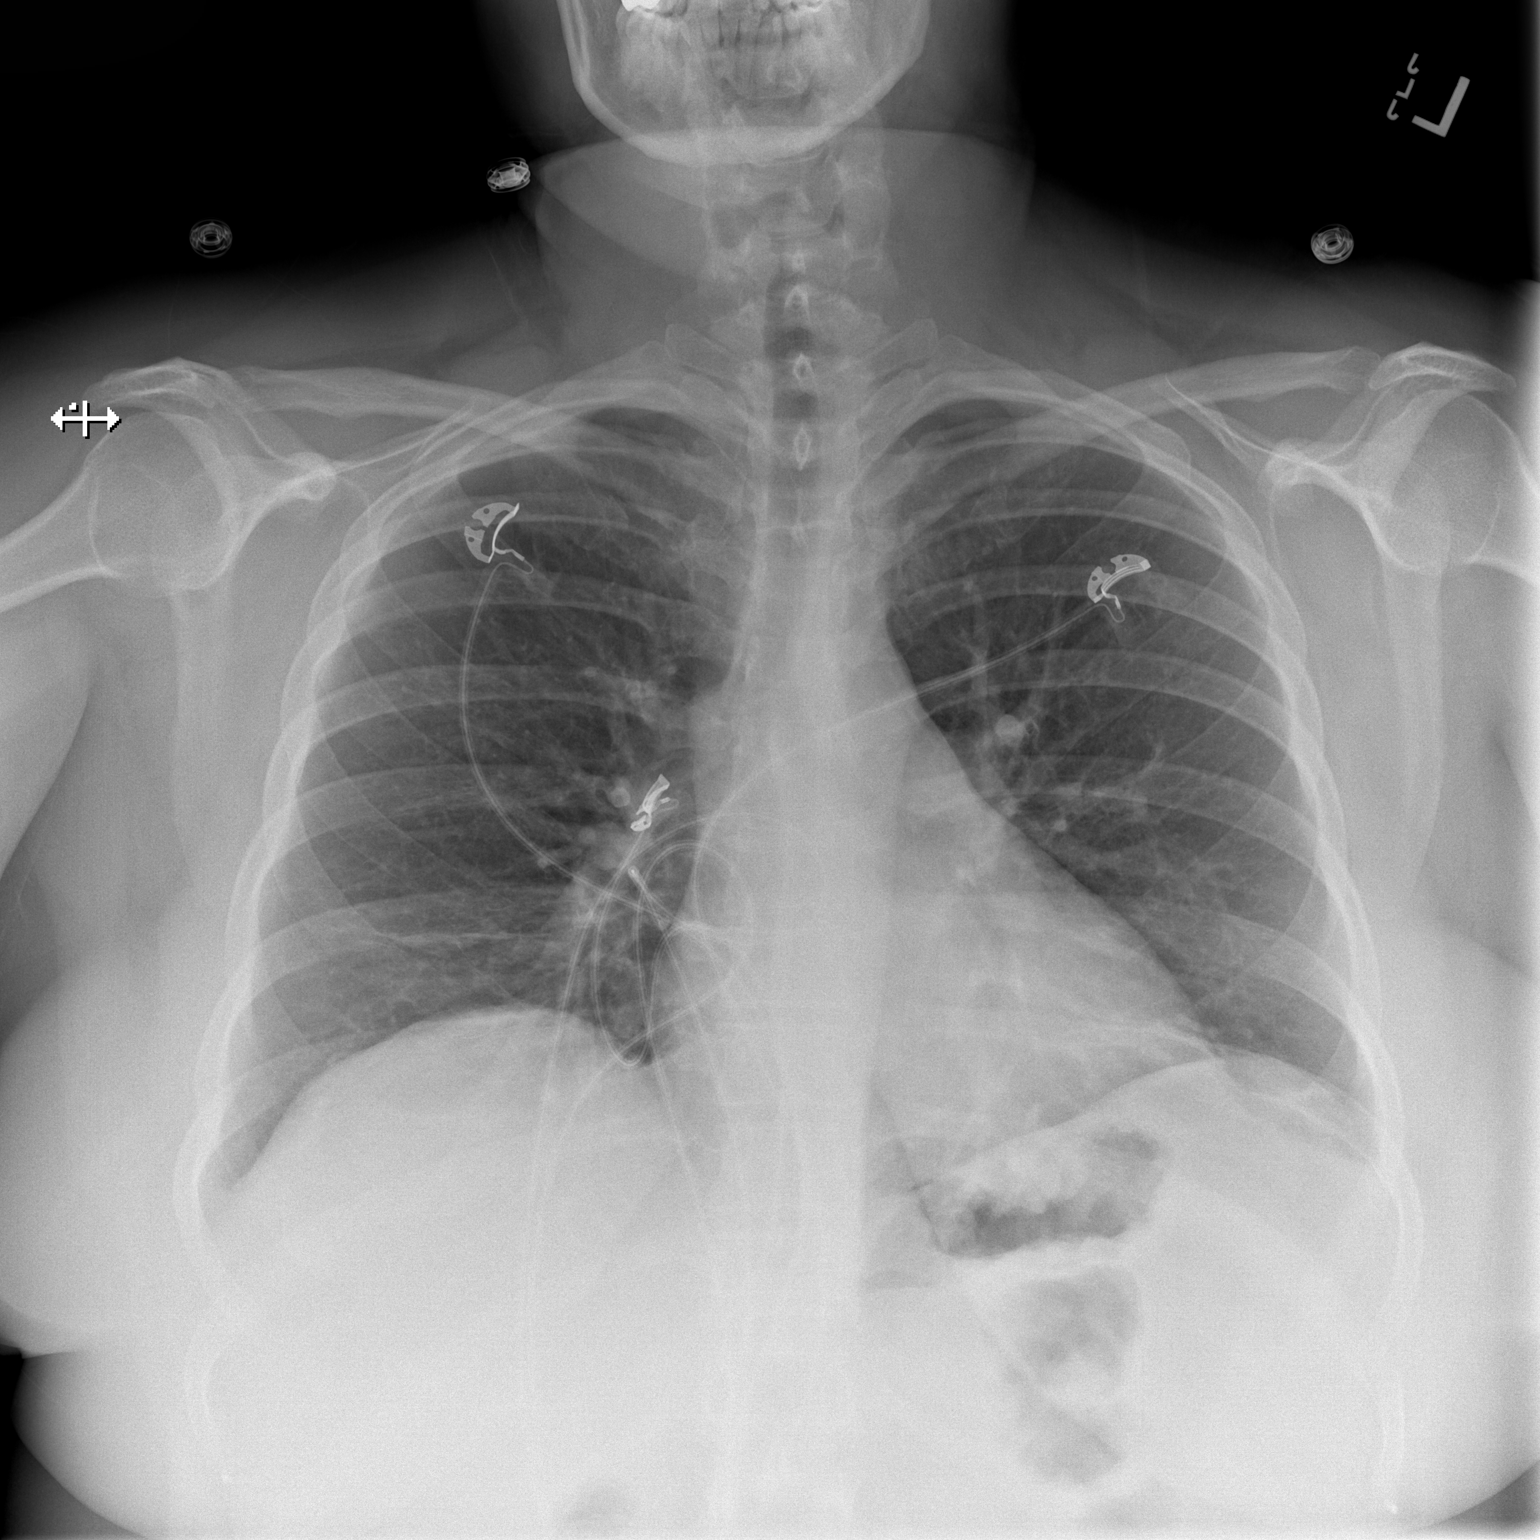

[w chest lat]
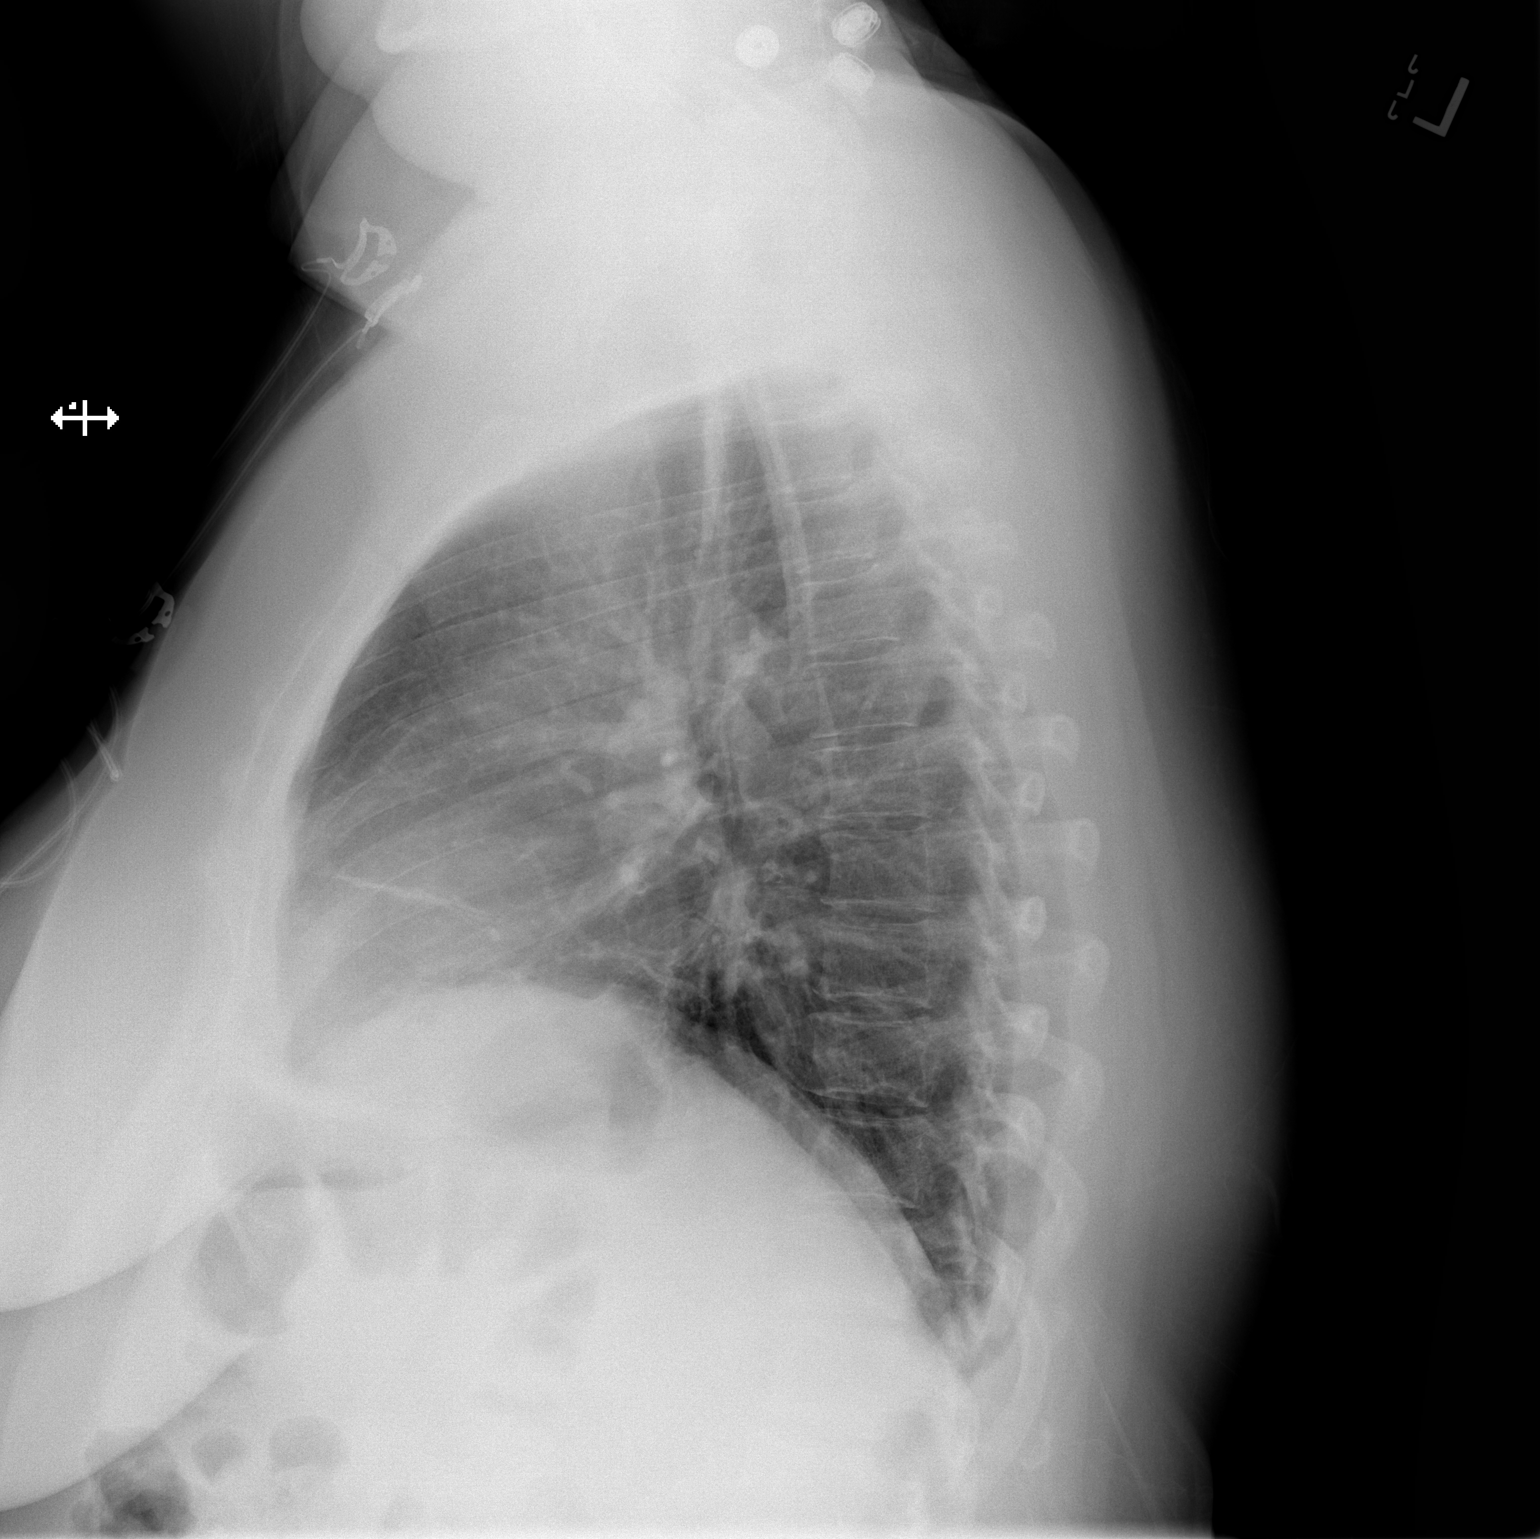

[2 of 2 positions shown; findings below may reference images not displayed]

FINDINGS: Linear atelectasis or fibrosis in the lung bases. No airspace
disease or consolidation. No blunting of costophrenic angles. No
pneumothorax. Mediastinal contours appear intact. Normal heart size
and pulmonary vascularity.
IMPRESSION: Linear atelectasis or fibrosis in the lung bases. No evidence of
active pulmonary disease.

## 2018-10-16 MED ORDER — ONDANSETRON HCL 4 MG/2ML IJ SOLN
4.0000 mg | Freq: Once | INTRAMUSCULAR | Status: AC
Start: 2018-10-16 — End: 2018-10-16
  Administered 2018-10-16: 4 mg via INTRAVENOUS
  Filled 2018-10-16: qty 2

## 2018-10-16 MED ORDER — DOXYCYCLINE HYCLATE 100 MG PO CAPS: 100.0000 mg | ORAL_CAPSULE | Freq: Two times a day (BID) | ORAL | 0 refills | Status: AC

## 2018-10-16 MED ORDER — SODIUM CHLORIDE 0.9 % IV BOLUS
1000.0000 mL | Freq: Once | INTRAVENOUS | Status: AC
  Administered 2018-10-16: 1000 mL via INTRAVENOUS

## 2018-10-16 MED ORDER — ONDANSETRON 4 MG PO TBDP: 4.0000 mg | ORAL_TABLET | Freq: Three times a day (TID) | ORAL | 0 refills | Status: DC | PRN

## 2018-10-16 MED ORDER — KETOROLAC TROMETHAMINE 15 MG/ML IJ SOLN
15.0000 mg | Freq: Once | INTRAMUSCULAR | Status: AC
  Administered 2018-10-16: 15 mg via INTRAVENOUS
  Filled 2018-10-16: qty 1

## 2018-10-16 MED ORDER — HYDROCODONE-HOMATROPINE 5-1.5 MG/5ML PO SYRP: 5.0000 mL | ORAL_SOLUTION | Freq: Four times a day (QID) | ORAL | 0 refills | Status: DC | PRN

## 2018-10-16 MED ORDER — DOXYCYCLINE HYCLATE 100 MG PO TABS
100.0000 mg | ORAL_TABLET | Freq: Once | ORAL | Status: AC
  Administered 2018-10-16: 100 mg via ORAL
  Filled 2018-10-16: qty 1

## 2018-10-16 MED ORDER — FLUCONAZOLE 150 MG PO TABS: 150.0000 mg | ORAL_TABLET | Freq: Every day | ORAL | 0 refills | Status: AC

## 2018-10-16 MED ORDER — SODIUM CHLORIDE 0.9 % IV SOLN
2.0000 g | Freq: Once | INTRAVENOUS | Status: AC
  Administered 2018-10-16: 2 g via INTRAVENOUS
  Filled 2018-10-16: qty 20

## 2018-10-16 NOTE — ED Notes (Signed)
Patient given discharge teaching and verbalized understanding. Patient ambulated out of ED with a steady gait. 

## 2018-10-16 NOTE — ED Provider Notes (Signed)
[facility]-EMERGENCY DEPT Provider Note   CSN: [id number] Arrival date & time: 10/15/18  2240    History   Chief Complaint Chief Complaint  Patient presents with  . Fever  . Nausea  . Generalized Body Aches    HPI [name] [name] is a 33 y.o. female.     HPI 33 year old female with past medical history as below here with cough, congestion, and generalized fatigue.  The patient states her symptoms started several days ago, with mild cough and sore throat.  She thought it was a URI and she went to urgent care and had a negative flu test.  Since then, she has had persistent cough, nasal congestion, sore throat, as well as fevers up to 101.  She does have multiple sick contacts that she works in [employer].  No recent travel outside of the [place].  No contact with anyone who is recently traveled.  She denies any significant headache or neck pain or neck stiffness.  She does endorse shortness of breath, worse with exertion, which is new for her.  She is also had some nausea and poor appetite, as well as a loose bowel movement.  No blood in her bowel movement.  No actual vomiting.  Husband who is with her has had no similar symptoms, though he does report he had a cold last week.  No specific alleviating or aggravating factors.  Past Medical History:  Diagnosis Date  . Anxiety   . Depression   . GERD (gastroesophageal reflux disease)   . History of kidney stones     Patient Active Problem List   Diagnosis Date Noted  . MDD (major depressive disorder), recurrent severe, without psychosis (HCC) 01/24/2017  . Suicidal ideation 01/24/2017  . MDD (major depressive disorder), recurrent episode (HCC) 01/23/2017    Past Surgical History:  Procedure Laterality Date  . CYSTOSCOPY WITH RETROGRADE PYELOGRAM, URETEROSCOPY AND STENT PLACEMENT Right 12/10/2016   Procedure: CYSTOSCOPY WITH RETROGRADE PYELOGRAM, URETEROSCOPY AND STONE REMOVAL;  Surgeon: Heloise PurpuraBorden, Lester, MD;   Location: [facility];  Service: Urology;  Laterality: Right;  . WISDOM TOOTH EXTRACTION  at age 33     OB History   No obstetric history on file.      Home Medications    Prior to Admission medications   Medication Sig Start Date End Date Taking? Authorizing Provider  cholecalciferol (VITAMIN D) 1000 units tablet Take 1,000 Units by mouth at bedtime.    [provider]  cyanocobalamin 1000 MCG tablet Take 1,000 mcg by mouth at bedtime.     [provider]  diphenhydrAMINE (BENADRYL) 25 MG tablet Take 50 mg by mouth every 6 (six) hours as needed for itching or allergies.    [provider]  doxycycline (VIBRAMYCIN) 100 MG capsule Take 1 capsule (100 mg total) by mouth 2 (two) times daily for 7 days. 10/16/18 10/23/18  Shaune PollackIsaacs, Glenn Gullickson, MD  escitalopram (LEXAPRO) 10 MG tablet Take 1 tablet (10 mg total) by mouth at bedtime. For depression Patient not taking: Reported on 04/13/2017 01/28/17   Denzil Magnusonhomas, Lashunda, NP  fluconazole (DIFLUCAN) 150 MG tablet Take 1 tablet (150 mg total) by mouth daily for 1 day. 10/16/18 10/17/18  Shaune PollackIsaacs, Demitrus Francisco, MD  fluticasone (FLONASE) 50 MCG/ACT nasal spray Place 1 spray into both nostrils 3 times/day as needed-between meals & bedtime for allergies or rhinitis.     [provider]  HYDROcodone-homatropine (HYCODAN) 5-1.5 MG/5ML syrup Take 5 mLs by mouth every 6 (six) hours as  needed for cough. 10/16/18   Shaune Pollack, MD  hydrOXYzine (ATARAX/VISTARIL) 25 MG tablet Take 1 tablet (25 mg total) by mouth every 6 (six) hours. 04/13/17   Roxy Horseman, PA-C  lamoTRIgine (LAMICTAL) 100 MG tablet Take 100 mg by mouth daily.    [provider]  levonorgestrel (MIRENA) 20 MCG/24HR IUD 1 each by Intrauterine route once.    [provider]  loratadine (CLARITIN) 10 MG tablet Take 10 mg by mouth at bedtime.    [provider]  ondansetron (ZOFRAN ODT) 4 MG disintegrating tablet Take 1 tablet (4 mg total) by mouth every  8 (eight) hours as needed for nausea or vomiting. 10/16/18   Shaune Pollack, MD  traZODone (DESYREL) 50 MG tablet Take 1 tablet (50 mg total) by mouth at bedtime as needed for sleep. Patient not taking: Reported on 04/13/2017 01/28/17   Denzil Magnuson, NP    Family History Family History  Problem Relation Age of Onset  . Depression Mother   . Anxiety disorder Mother     Social History Social History   Tobacco Use  . Smoking status: Never Smoker  . Smokeless tobacco: Never Used  Substance Use Topics  . Alcohol use: Yes    Alcohol/week: 1.0 standard drinks    Types: 1 Glasses of wine per week    Comment: per week  . Drug use: No     Allergies   Wellbutrin [bupropion]   Review of Systems Review of Systems  Constitutional: Positive for chills and fatigue. Negative for fever.  HENT: Positive for sore throat. Negative for congestion and rhinorrhea.   Eyes: Negative for visual disturbance.  Respiratory: Positive for cough and shortness of breath. Negative for wheezing.   Cardiovascular: Negative for chest pain and leg swelling.  Gastrointestinal: Negative for abdominal pain, diarrhea, nausea and vomiting.  Genitourinary: Negative for dysuria and flank pain.  Musculoskeletal: Negative for neck pain and neck stiffness.  Skin: Negative for rash and wound.  Allergic/Immunologic: Negative for immunocompromised state.  Neurological: Positive for weakness. Negative for syncope and headaches.  All other systems reviewed and are negative.    Physical Exam Updated Vital Signs BP 123/60   Pulse 75   Temp 99 F (37.2 C) (Oral) Comment: Pt had tylenol @ 19:00  Resp 19   Ht  (1.626 m)   Wt 115.7 kg   SpO2 100%   BMI 43.77 kg/m   Physical Exam Vitals signs and nursing note reviewed.  Constitutional:      General: She is not in acute distress.    Appearance: She is well-developed.  HENT:     Head: Normocephalic and atraumatic.     Comments: Moderate posterior  pharyngeal erythema without tonsillar swelling or exudates.  Dry mucous membranes.  Nasal congestion.  Normal tympanic membranes bilaterally. Eyes:     Conjunctiva/sclera: Conjunctivae normal.  Neck:     Musculoskeletal: Neck supple.  Cardiovascular:     Rate and Rhythm: Normal rate and regular rhythm.     Heart sounds: Normal heart sounds. No murmur. No friction rub.  Pulmonary:     Effort: Pulmonary effort is normal. No respiratory distress.     Breath sounds: Normal breath sounds. No wheezing or rales.     Comments: Normal work of breathing.  Rales noted in bibasilar lung fields.  No wheezing.  No tachypnea. Abdominal:     General: There is no distension.     Palpations: Abdomen is soft.     Tenderness: There  is no abdominal tenderness.  Skin:    General: Skin is warm.     Capillary Refill: Capillary refill takes less than 2 seconds.  Neurological:     Mental Status: She is alert and oriented to person, place, and time.     Motor: No abnormal muscle tone.      ED Treatments / Results  Labs (all labs ordered are listed, but only abnormal results are displayed) Labs Reviewed  COMPREHENSIVE METABOLIC PANEL - Abnormal; Notable for the following components:      Result Value   Sodium 134 (*)    Glucose, Bld 108 (*)    Calcium 8.5 (*)    All other components within normal limits  URINALYSIS, ROUTINE W REFLEX MICROSCOPIC - Abnormal; Notable for the following components:   Color, Urine STRAW (*)    Specific Gravity, Urine 1.004 (*)    Leukocytes,Ua TRACE (*)    All other components within normal limits  LIPASE, BLOOD  CBC  I-STAT BETA HCG BLOOD, ED ([facility], [facility], [facility] ONLY)    EKG None  Radiology Dg Chest 2 View  Result Date: 10/16/2018 CLINICAL DATA:  Body aches, fever, nonproductive cough, nausea. Six since Tuesday. No recent travel. EXAM: CHEST - 2 VIEW COMPARISON:  04/12/2017 FINDINGS: Linear atelectasis or fibrosis in the lung bases. No airspace disease or consolidation.  No blunting of costophrenic angles. No pneumothorax. Mediastinal contours appear intact. Normal heart size and pulmonary vascularity. IMPRESSION: Linear atelectasis or fibrosis in the lung bases. No evidence of active pulmonary disease. Electronically Signed   By: Burman Nieves M.D.   On: 10/16/2018 00:35    Procedures Procedures (including critical care time)  Medications Ordered in ED Medications  ondansetron (ZOFRAN-ODT) disintegrating tablet 4 mg (has no administration in time range)  sodium chloride flush (NS) 0.9 % injection 3 mL (3 mLs Intravenous Given 10/15/18 2336)  sodium chloride 0.9 % bolus 1,000 mL (0 mLs Intravenous Stopped 10/16/18 0214)  ondansetron (ZOFRAN) injection 4 mg (4 mg Intravenous Given 10/16/18 0034)  ketorolac (TORADOL) 15 MG/ML injection 15 mg (15 mg Intravenous Given 10/16/18 0034)  doxycycline (VIBRA-TABS) tablet 100 mg (100 mg Oral Given 10/16/18 0142)  cefTRIAXone (ROCEPHIN) 2 g in sodium chloride 0.9 % 100 mL IVPB (0 g Intravenous Stopped 10/16/18 0214)     Initial Impression / Assessment and Plan / ED Course  I have reviewed the triage vital signs and the nursing notes.  Pertinent labs & imaging results that were available during my care of the patient were reviewed by me and considered in my medical decision making (see chart for details).  Clinical Course as of Oct 15 217  Sat Oct 16, 2018  0122 33 yo F here with cough, fever, fatigue, nausea, poor appetite. Suspect atypical PNA, vs viral syndrome. She works in [employer] ge[employer] around multiple sick contacts. Labs show likely mild dehydration with hyponatremia, but o/w are unremarkable. Normal WBC. No AG to suggest lactic acidosis. UA unremarkable. CXR does show atelectasis, which in setting of fever, cold sx is concerning for early PNA. She is not hypoxic. Will start on empiric ABX, d/c with outpt follow-up. Encouraged fluids. Her poor appetite is likely a component of her URI/PNA. No abd TTP, normal LFTs, no RUQ or  RLQ TTP, no sx to suggest appendicitis, cholecystitis, or other intra-abd emergent pathology. No travel outside of [place], no contact with high risk individuals, no travel to endemic areas for COVID-19.   [CI]    Clinical Course User Index [  CI] Shaune Pollack, MD        Final Clinical Impressions(s) / ED Diagnoses   Final diagnoses:  Viral illness  Community acquired pneumonia, unspecified laterality    ED Discharge Orders         Ordered    doxycycline (VIBRAMYCIN) 100 MG capsule  2 times daily     10/16/18 0128    ondansetron (ZOFRAN ODT) 4 MG disintegrating tablet  Every 8 hours PRN     10/16/18 0128    HYDROcodone-homatropine (HYCODAN) 5-1.5 MG/5ML syrup  Every 6 hours PRN     10/16/18 0128    fluconazole (DIFLUCAN) 150 MG tablet  Daily     10/16/18 0147           Shaune Pollack, MD 10/16/18 [id number]

## 2019-01-29 ENCOUNTER — Telehealth: Payer: Self-pay | Admitting: Emergency Medicine

## 2019-01-29 NOTE — Telephone Encounter (Signed)
Received call from patient through the Community Hotline after hours for [facility] with concerns for new onset of symptom 3 days ago of sore throat, fatigue, headache, dizziness and shortness of breath.  Discussed severity of each symptom with patient and discussed options for care (patient does not currently have a PCP with [facility]).  Patient's shortness of breath is not constant and patient denies fevers at this time (but does think "they're coming").  Encouraged patient to follow up with [facility] tomorrow for assessment, and reviewed ER precautions for SOB or other concerning symptoms.  Patient verbalized understanding and thanked Therapist, sports for being available.

## 2019-01-30 ENCOUNTER — Telehealth: Payer: Self-pay

## 2019-01-30 ENCOUNTER — Encounter (HOSPITAL_COMMUNITY): Payer: Self-pay | Admitting: Emergency Medicine

## 2019-01-30 ENCOUNTER — Ambulatory Visit (INDEPENDENT_AMBULATORY_CARE_PROVIDER_SITE_OTHER): Payer: BLUE CROSS/BLUE SHIELD

## 2019-01-30 ENCOUNTER — Other Ambulatory Visit: Payer: Self-pay

## 2019-01-30 ENCOUNTER — Telehealth (HOSPITAL_COMMUNITY): Payer: Self-pay | Admitting: *Deleted

## 2019-01-30 ENCOUNTER — Ambulatory Visit (HOSPITAL_COMMUNITY)
Admission: EM | Admit: 2019-01-30 | Discharge: 2019-01-30 | Disposition: A | Discharge status: Home / Self Care | Payer: BLUE CROSS/BLUE SHIELD | Attending: Emergency Medicine | Admitting: Emergency Medicine

## 2019-01-30 DIAGNOSIS — Z20822 Contact with and (suspected) exposure to covid-19: Secondary | ICD-10-CM

## 2019-01-30 DIAGNOSIS — J22 Unspecified acute lower respiratory infection: Secondary | ICD-10-CM | POA: Insufficient documentation

## 2019-01-30 LAB — POCT RAPID STREP A: Streptococcus, Group A Screen (Direct): NEGATIVE

## 2019-01-30 IMAGING — DX CHEST - 2 VIEW
2 series · 2 of 2 positions shown · non-contrast
Comparison: [DATE]

CLINICAL DATA: Shortness of breath, fatigue, cough

EXAM:
CHEST - 2 VIEW

[chest pa]
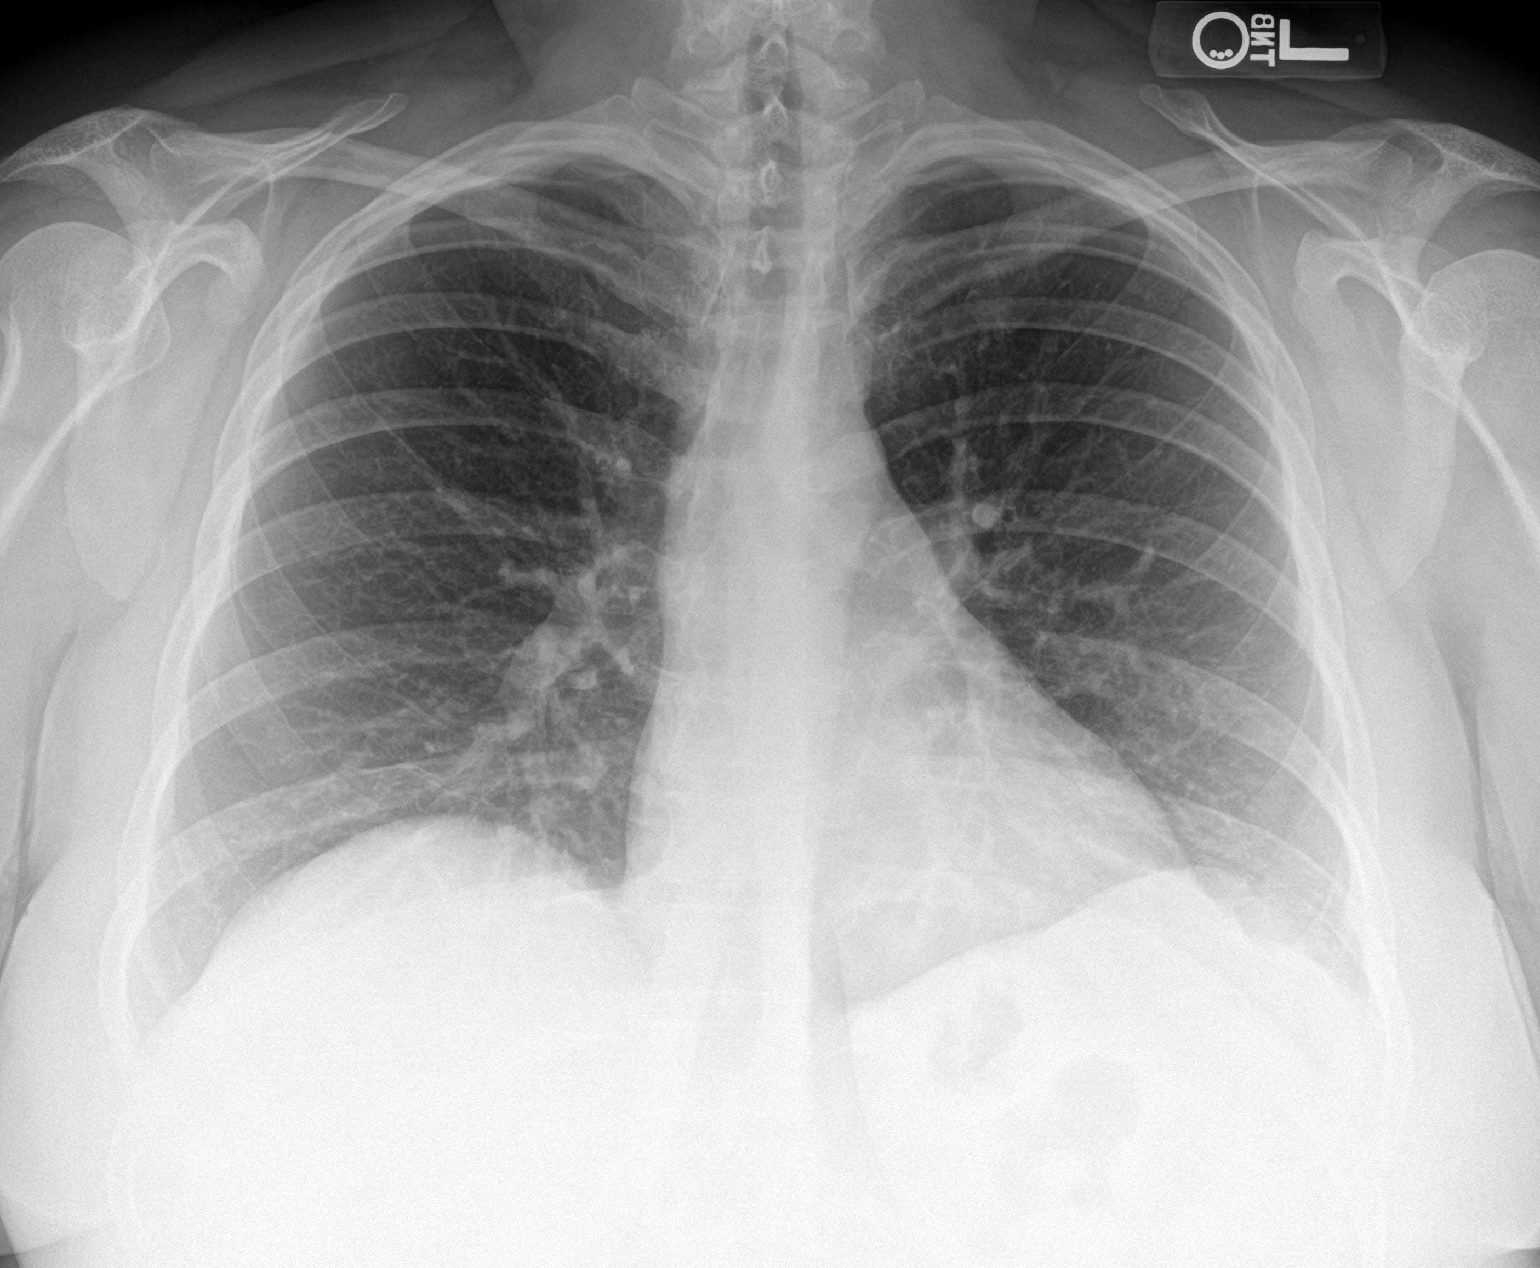

[chest lat]
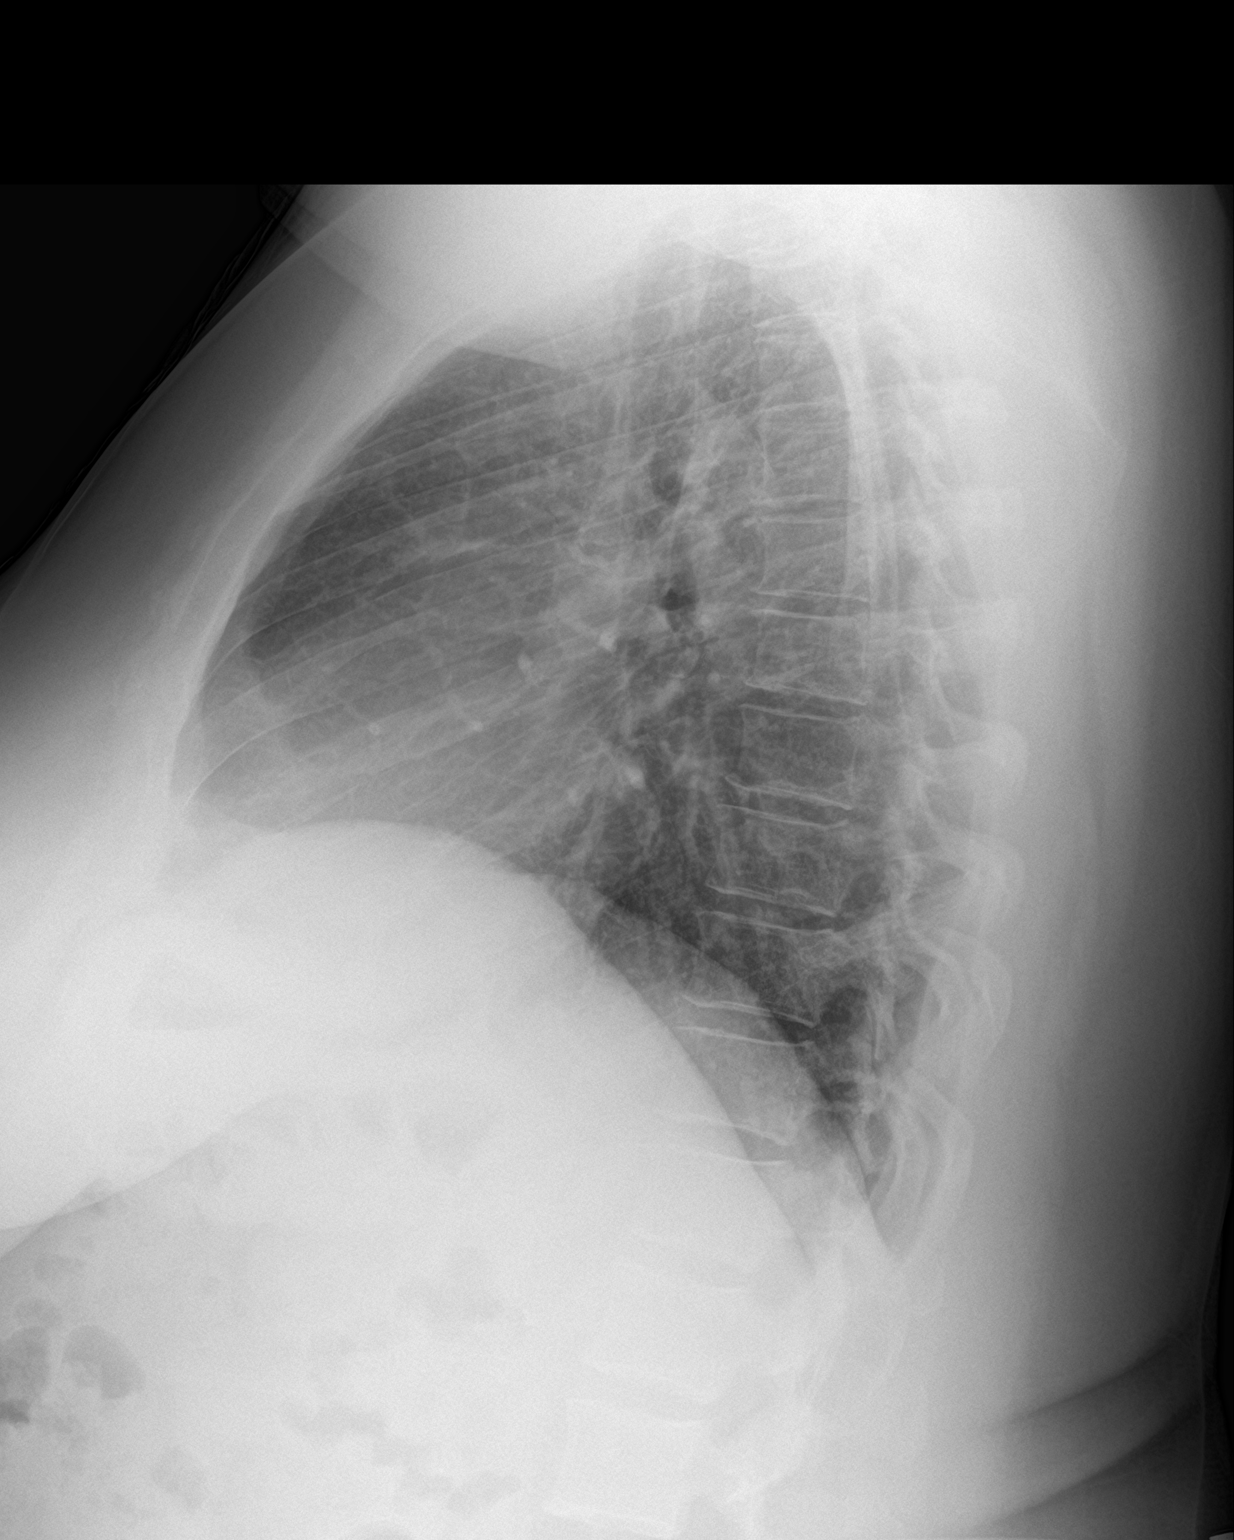

[2 of 2 positions shown; findings below may reference images not displayed]

FINDINGS: The heart size and mediastinal contours are within normal limits. No
definite acute airspace opacity. There may be subtle bibasilar
ground-glass opacities. The visualized skeletal structures are
unremarkable.
IMPRESSION: No definite acute airspace opacity. There may be subtle bibasilar
ground-glass opacities, nonspecific and infectious or inflammatory.

## 2019-01-30 MED ORDER — CETIRIZINE HCL 10 MG PO CAPS: 10.0000 mg | ORAL_CAPSULE | Freq: Every day | ORAL | 0 refills | Status: DC

## 2019-01-30 MED ORDER — FLUCONAZOLE 150 MG PO TABS: ORAL_TABLET | ORAL | 0 refills | Status: DC

## 2019-01-30 MED ORDER — DOXYCYCLINE HYCLATE 100 MG PO CAPS: 100.0000 mg | ORAL_CAPSULE | Freq: Two times a day (BID) | ORAL | 0 refills | Status: AC

## 2019-01-30 MED ORDER — BENZONATATE 200 MG PO CAPS: 200.0000 mg | ORAL_CAPSULE | Freq: Three times a day (TID) | ORAL | 0 refills | Status: AC | PRN

## 2019-01-30 NOTE — Discharge Instructions (Addendum)
Someone will be in contact with you about COVID testing [place].  X-ray suggestive of possible early pneumonia.  Please begin taking doxycycline twice daily for the next 10 days Tessalon as needed for cough every 8 hours Begin daily cetirizine, continue Singulair Rest and drink plenty of fluids Please take Tylenol [phone] mg every 4-6 hours as needed for body aches, supplement with ibuprofen as needed  Please follow-up if symptoms progressing, worsening, not improving, developing fevers Please limit contact with others in quarantine at home until results of the test returned.

## 2019-01-30 NOTE — Telephone Encounter (Signed)
Called pt and scheduled testing. Appt is 01/31/19 @ 10:15 at [facility]. Pt instructed to stay in car and to wear mask to testing site. Pt verbalized understanding.

## 2019-01-30 NOTE — ED Triage Notes (Signed)
PER PT she as in hospital in march with pnemonia and had same symptoms now as she did then. Pt having SOB, CHILLs, BODY ACHES, FATIGUE. No fevers.

## 2019-01-30 NOTE — Telephone Encounter (Signed)
-----   Message from Janith Lima, PA-C sent at 01/30/2019 12:03 PM EDT ----- Regarding: Needs COVID testing Cough, shortness of breath and fatigue x 1 week

## 2019-01-30 NOTE — Telephone Encounter (Signed)
Pt requesting med for yeast infection due to being placed on abx.  V.O. Hallie, Wieters, PA: may call in  Diflucan 150mg  tabs; take one po, repeat in 3 days if symptoms persist; qty #2, no refills.

## 2019-01-31 ENCOUNTER — Other Ambulatory Visit: Payer: Self-pay

## 2019-01-31 DIAGNOSIS — Z20822 Contact with and (suspected) exposure to covid-19: Secondary | ICD-10-CM

## 2019-01-31 NOTE — ED Provider Notes (Signed)
[facility]    CSN: [id number] Arrival date & time: 01/30/19  1020      History   Chief Complaint Chief Complaint  Patient presents with  . Sore Throat  . Fatigue  . Shortness of Breath    HPI [name] [name] is a 33 y.o. female history of GERD, depression, presenting today for evaluation of cough and shortness of breath.  Patient states that over the past 5 to 7 days she has had chills, body aches and fatigue.  She has had no known measured fevers.  She is also had associated congestion, sore throat and cough.  Of recently she has developed increased shortness of breath and chest and back discomfort.  She is concerned about pneumonia she notes that it back in March she had pneumonia and this feels similar.  She denies any known exposure to COVID.  Denies any close sick contacts.  She has taken over-the-counter medicine without relief.  HPI  Past Medical History:  Diagnosis Date  . Anxiety   . Depression   . GERD (gastroesophageal reflux disease)   . History of kidney stones     Patient Active Problem List   Diagnosis Date Noted  . MDD (major depressive disorder), recurrent severe, without psychosis (HCC) 01/24/2017  . Suicidal ideation 01/24/2017  . MDD (major depressive disorder), recurrent episode (HCC) 01/23/2017    Past Surgical History:  Procedure Laterality Date  . CYSTOSCOPY WITH RETROGRADE PYELOGRAM, URETEROSCOPY AND STENT PLACEMENT Right 12/10/2016   Procedure: CYSTOSCOPY WITH RETROGRADE PYELOGRAM, URETEROSCOPY AND STONE REMOVAL;  Surgeon: Heloise PurpuraBorden, Lester, MD;  Location: [facility];  Service: Urology;  Laterality: Right;  . WISDOM TOOTH EXTRACTION  at age 33    OB History   No obstetric history on file.      Home Medications    Prior to Admission medications   Medication Sig Start Date End Date Taking? Authorizing Provider  benzonatate (TESSALON) 200 MG capsule Take 1 capsule (200 mg total) by mouth 3 (three) times daily as needed for up to 7  days for cough. 01/30/19 02/06/19  Wieters, Hallie C, PA-C  Cetirizine HCl 10 MG CAPS Take 1 capsule (10 mg total) by mouth daily for 10 days. 01/30/19 02/09/19  Wieters, Hallie C, PA-C  cholecalciferol (VITAMIN D) 1000 units tablet Take 1,000 Units by mouth at bedtime.    [provider]  cyanocobalamin 1000 MCG tablet Take 1,000 mcg by mouth at bedtime.     [provider]  diphenhydrAMINE (BENADRYL) 25 MG tablet Take 50 mg by mouth every 6 (six) hours as needed for itching or allergies.    [provider]  doxycycline (VIBRAMYCIN) 100 MG capsule Take 1 capsule (100 mg total) by mouth 2 (two) times daily for 10 days. 01/30/19 02/09/19  Wieters, Hallie C, PA-C  escitalopram (LEXAPRO) 10 MG tablet Take 1 tablet (10 mg total) by mouth at bedtime. For depression Patient not taking: Reported on 04/13/2017 01/28/17   Denzil Magnusonhomas, Lashunda, NP  fluconazole (DIFLUCAN) 150 MG tablet Take one tablet by mouth.  May repeat in 3 days if symptoms persist. 01/30/19   Wieters, Hallie C, PA-C  fluticasone (FLONASE) 50 MCG/ACT nasal spray Place 1 spray into both nostrils 3 times/day as needed-between meals & bedtime for allergies or rhinitis.     [provider]  HYDROcodone-homatropine (HYCODAN) 5-1.5 MG/5ML syrup Take 5 mLs by mouth every 6 (six) hours as needed for cough. 10/16/18   Shaune PollackIsaacs, Cameron, MD  hydrOXYzine (ATARAX/VISTARIL) 25 MG  tablet Take 1 tablet (25 mg total) by mouth every 6 (six) hours. 04/13/17   Roxy HorsemanBrowning, Robert, PA-C  lamoTRIgine (LAMICTAL) 100 MG tablet Take 100 mg by mouth daily.    [provider]  levonorgestrel (MIRENA) 20 MCG/24HR IUD 1 each by Intrauterine route once.    [provider]  loratadine (CLARITIN) 10 MG tablet Take 10 mg by mouth at bedtime.    [provider]  ondansetron (ZOFRAN ODT) 4 MG disintegrating tablet Take 1 tablet (4 mg total) by mouth every 8 (eight) hours as needed for nausea or vomiting. 10/16/18   Shaune PollackIsaacs, Cameron, MD   traZODone (DESYREL) 50 MG tablet Take 1 tablet (50 mg total) by mouth at bedtime as needed for sleep. Patient not taking: Reported on 04/13/2017 01/28/17   Denzil Magnusonhomas, Lashunda, NP    Family History Family History  Problem Relation Age of Onset  . Depression Mother   . Anxiety disorder Mother     Social History Social History   Tobacco Use  . Smoking status: Never Smoker  . Smokeless tobacco: Never Used  Substance Use Topics  . Alcohol use: Yes    Alcohol/week: 1.0 standard drinks    Types: 1 Glasses of wine per week    Comment: per week  . Drug use: No     Allergies   Wellbutrin [bupropion]   Review of Systems Review of Systems  Constitutional: Positive for chills and fatigue. Negative for activity change, appetite change and fever.  HENT: Positive for congestion, rhinorrhea and sore throat. Negative for ear pain, sinus pressure and trouble swallowing.   Eyes: Negative for discharge and redness.  Respiratory: Positive for cough and shortness of breath. Negative for chest tightness.   Cardiovascular: Negative for chest pain.  Gastrointestinal: Negative for abdominal pain, diarrhea, nausea and vomiting.  Musculoskeletal: Negative for myalgias.  Skin: Negative for rash.  Neurological: Negative for dizziness, light-headedness and headaches.     Physical Exam Triage Vital Signs ED Triage Vitals  Enc Vitals Group     BP 01/30/19 1059 104/74     Pulse Rate 01/30/19 1059 95     Resp 01/30/19 1059 16     Temp 01/30/19 1059 97.8 F (36.6 C)     Temp Source 01/30/19 1059 Oral     SpO2 01/30/19 1059 99 %     Weight --      Height --      Head Circumference --      Peak Flow --      Pain Score 01/30/19 1100 4     Pain Loc --      Pain Edu? --      Excl. in GC? --    No data found.  Updated Vital Signs BP 104/74 (BP Location: Right Arm)   Pulse 95   Temp 97.8 F (36.6 C) (Oral)   Resp 16   SpO2 99%   Visual Acuity Right Eye Distance:   Left Eye Distance:    Bilateral Distance:    Right Eye Near:   Left Eye Near:    Bilateral Near:     Physical Exam Vitals signs and nursing note reviewed.  Constitutional:      General: She is not in acute distress.    Appearance: She is well-developed.  HENT:     Head: Normocephalic and atraumatic.     Ears:     Comments: Bilateral ears without tenderness to palpation of external auricle, tragus and mastoid, EAC's without erythema or  swelling, TM's with good bony landmarks and cone of light. Non erythematous.     Mouth/Throat:     Comments: Oral mucosa pink and moist, no tonsillar enlargement or exudate. Posterior pharynx patent and nonerythematous, no uvula deviation or swelling. Normal phonation.  Eyes:     Conjunctiva/sclera: Conjunctivae normal.  Neck:     Musculoskeletal: Neck supple.  Cardiovascular:     Rate and Rhythm: Normal rate and regular rhythm.     Heart sounds: No murmur.  Pulmonary:     Effort: Pulmonary effort is normal. No respiratory distress.     Breath sounds: Normal breath sounds.     Comments: Breathing comfortably at rest, CTABL, no wheezing, rales or other adventitious sounds auscultated No coughing noted during visit Abdominal:     Palpations: Abdomen is soft.     Tenderness: There is no abdominal tenderness.  Skin:    General: Skin is warm and dry.  Neurological:     Mental Status: She is alert.      UC Treatments / Results  Labs (all labs ordered are listed, but only abnormal results are displayed) Labs Reviewed  CULTURE, GROUP A STREP [facility])  POCT RAPID STREP A    EKG None  Radiology Dg Chest 2 View  Result Date: 01/30/2019 CLINICAL DATA:  Shortness of breath, fatigue, cough EXAM: CHEST - 2 VIEW COMPARISON:  10/16/2018 FINDINGS: The heart size and mediastinal contours are within normal limits. No definite acute airspace opacity. There may be subtle bibasilar ground-glass opacities. The visualized skeletal structures are unremarkable. IMPRESSION: No  definite acute airspace opacity. There may be subtle bibasilar ground-glass opacities, nonspecific and infectious or inflammatory. Electronically Signed   By: Eddie Candle M.D.   On: 01/30/2019 11:56    Procedures Procedures (including critical care time)  Medications Ordered in UC Medications - No data to display  Initial Impression / Assessment and Plan / UC Course  I have reviewed the triage vital signs and the nursing notes.  Pertinent labs & imaging results that were available during my care of the patient were reviewed by me and considered in my medical decision making (see chart for details).     X-ray suggestive of possible bibasilar groundglass opacities, possible infectious etiology.  We will go ahead and cover for atypicals with doxycycline.  Also recommending symptomatic and supportive care with Tessalon, cetirizine, rest and fluids.  Symptoms may be related to COVID, will set up testing to evaluate for this.  Recommended quarantining until results returned.  Follow-up if symptoms progressing or worsening.Discussed strict return precautions. Patient verbalized understanding and is agreeable with plan.  Final Clinical Impressions(s) / UC Diagnoses   Final diagnoses:  Lower respiratory infection (e.g., bronchitis, pneumonia, pneumonitis, pulmonitis)     Discharge Instructions     Someone will be in contact with you about COVID testing [place].  X-ray suggestive of possible early pneumonia.  Please begin taking doxycycline twice daily for the next 10 days Tessalon as needed for cough every 8 hours Begin daily cetirizine, continue Singulair Rest and drink plenty of fluids Please take Tylenol [phone] mg every 4-6 hours as needed for body aches, supplement with ibuprofen as needed  Please follow-up if symptoms progressing, worsening, not improving, developing fevers Please limit contact with others in quarantine at home until results of the test returned.    ED Prescriptions    Medication Sig Dispense Auth. Provider   doxycycline (VIBRAMYCIN) 100 MG capsule Take 1 capsule (100 mg total)  by mouth 2 (two) times daily for 10 days. 20 capsule Wieters, Hallie C, PA-C   benzonatate (TESSALON) 200 MG capsule Take 1 capsule (200 mg total) by mouth 3 (three) times daily as needed for up to 7 days for cough. 28 capsule Wieters, Hallie C, PA-C   Cetirizine HCl 10 MG CAPS Take 1 capsule (10 mg total) by mouth daily for 10 days. 10 capsule Wieters, Hallie C, PA-C     Controlled Substance Prescriptions [place] Controlled Substance Registry consulted? Not Applicable   Lew DawesWieters, Hallie C, [place] JerseyPA-C 01/31/19 1704

## 2019-02-01 LAB — CULTURE, GROUP A STREP (THRC)

## 2019-02-03 LAB — NOVEL CORONAVIRUS, NAA: SARS-CoV-2, NAA: NOT DETECTED

## 2019-02-07 ENCOUNTER — Encounter (HOSPITAL_COMMUNITY): Payer: Self-pay

## 2019-02-07 ENCOUNTER — Telehealth (HOSPITAL_COMMUNITY): Payer: Self-pay | Admitting: Emergency Medicine

## 2019-02-07 NOTE — Telephone Encounter (Signed)
Your test for COVID-19 was negative.  Please continue good preventive care measures, including:  frequent hand-washing, avoid touching your face, cover coughs/sneezes, stay out of crowds and keep a 6 foot distance from others.  If you develop fever/cough/breathlessness, please stay home for 10 days and until you have had 3 consecutive days with cough/breathlessness improving and without fever (without taking a fever reducer). Go to the nearest hospital ED tent for assessment if fever/cough/breathlessness are severe or illness seems like a threat to life.  Attempted to reach patient. No answer at this time. Voicemail left.   Notified in [place].

## 2019-02-12 ENCOUNTER — Other Ambulatory Visit: Payer: Self-pay

## 2019-02-12 ENCOUNTER — Ambulatory Visit (INDEPENDENT_AMBULATORY_CARE_PROVIDER_SITE_OTHER): Payer: BLUE CROSS/BLUE SHIELD

## 2019-02-12 ENCOUNTER — Encounter (HOSPITAL_COMMUNITY): Payer: Self-pay | Admitting: Emergency Medicine

## 2019-02-12 ENCOUNTER — Ambulatory Visit (HOSPITAL_COMMUNITY)
Admission: EM | Admit: 2019-02-12 | Discharge: 2019-02-12 | Disposition: A | Discharge status: Home / Self Care | Payer: BLUE CROSS/BLUE SHIELD

## 2019-02-12 DIAGNOSIS — Z8701 Personal history of pneumonia (recurrent): Secondary | ICD-10-CM | POA: Diagnosis not present

## 2019-02-12 DIAGNOSIS — Z09 Encounter for follow-up examination after completed treatment for conditions other than malignant neoplasm: Secondary | ICD-10-CM

## 2019-02-12 IMAGING — DX CHEST - 2 VIEW
2 series · 2 of 2 positions shown · non-contrast
Comparison: [DATE]

CLINICAL DATA: Recurrent pneumonia

EXAM:
CHEST - 2 VIEW

[chest pa]
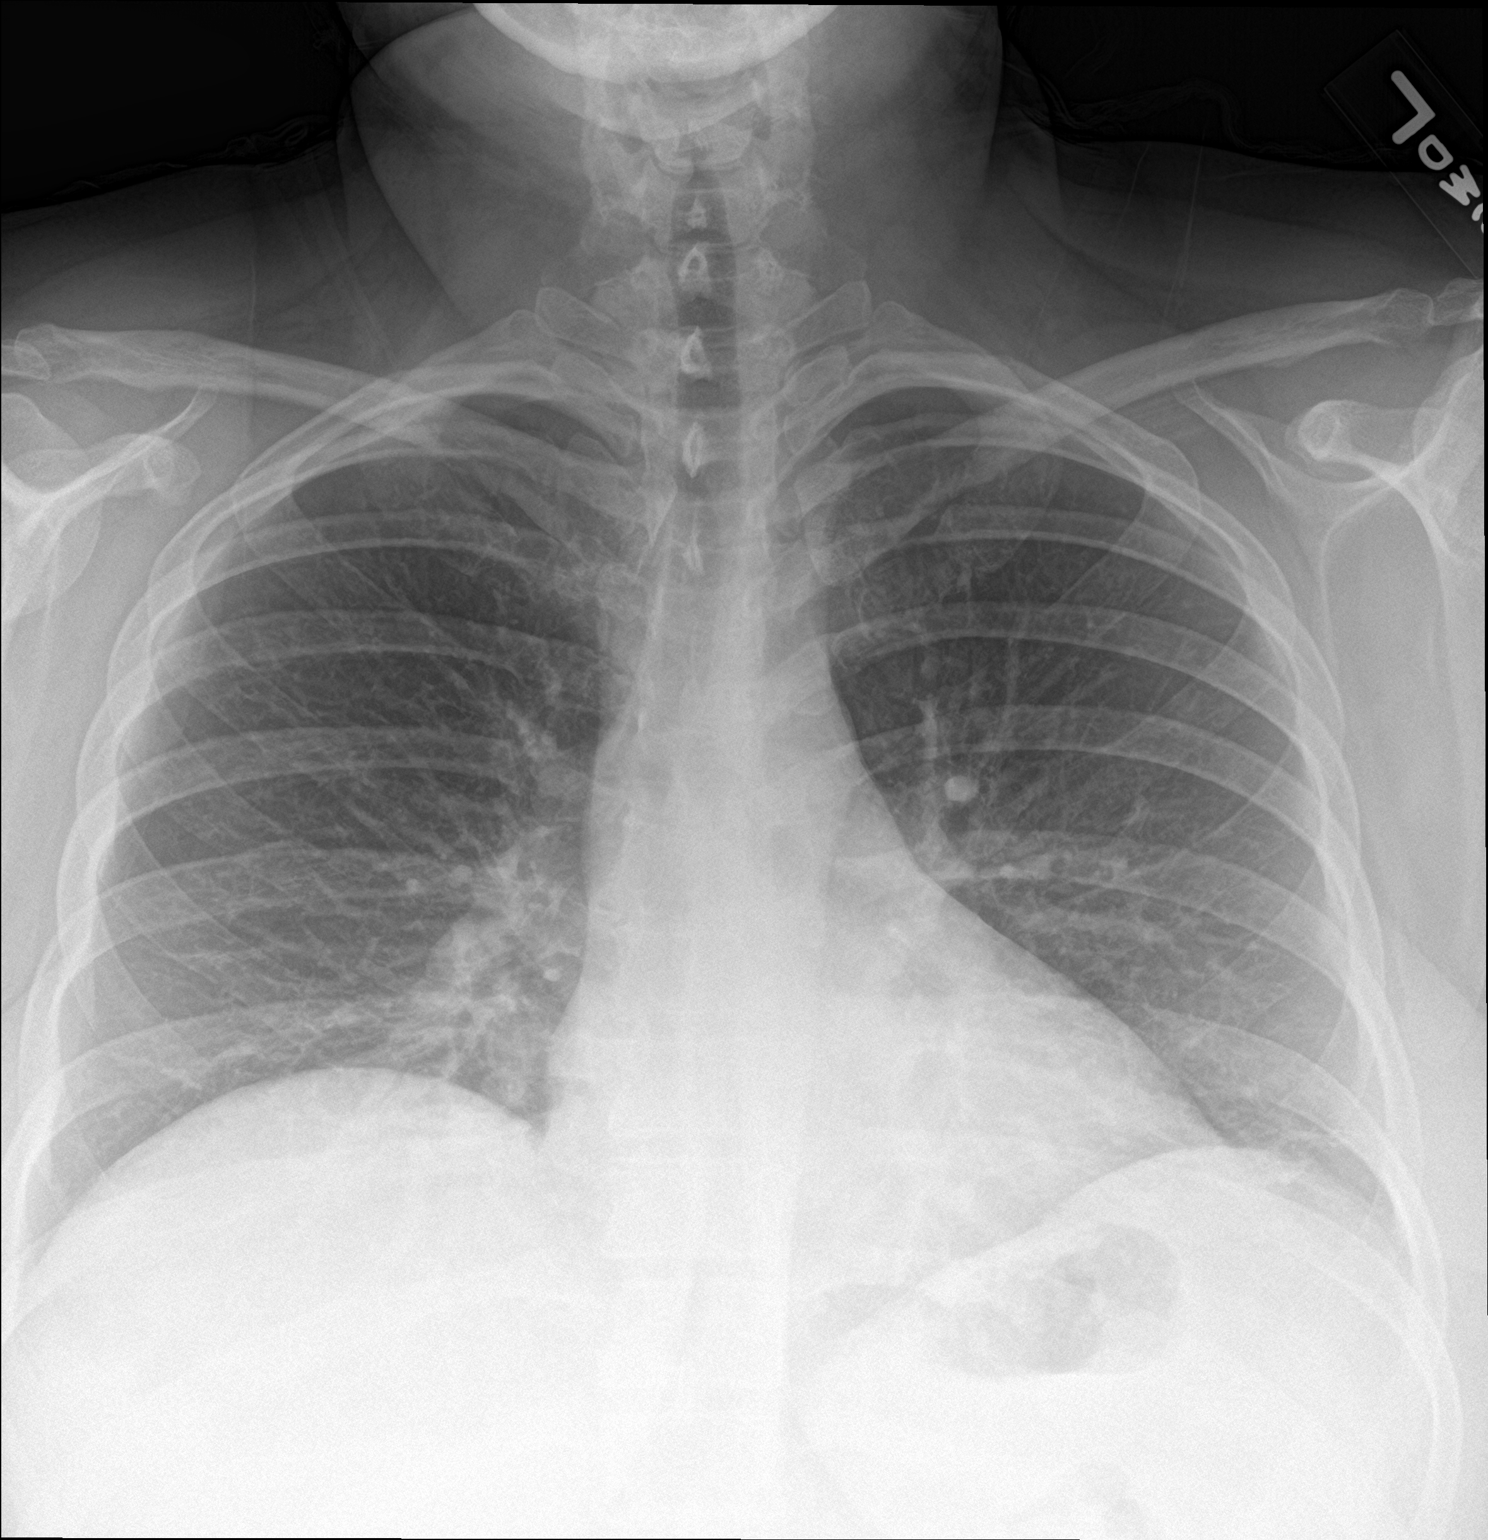

[chest lat]
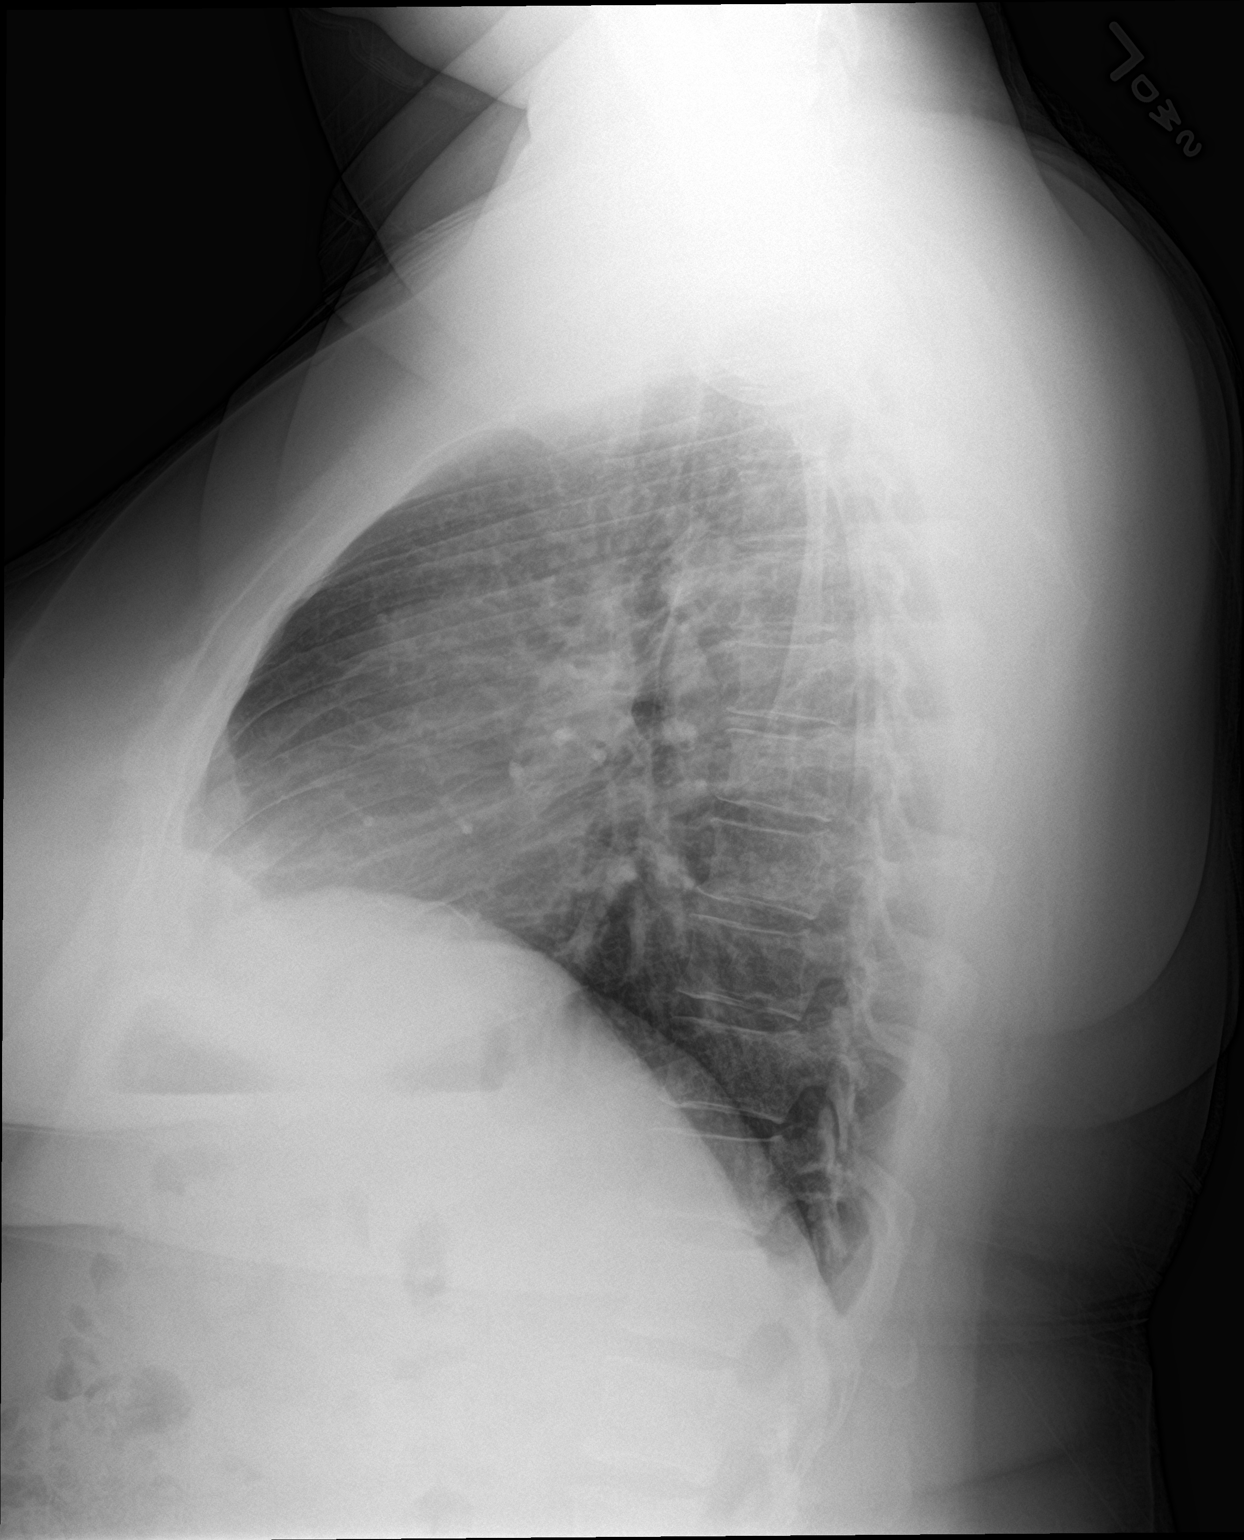

[2 of 2 positions shown; findings below may reference images not displayed]

FINDINGS: Examination is not significantly changed compared to prior, perhaps
with minimal bibasilar atelectasis or scarring. There is no new or
focal airspace opacity. The heart and mediastinum are normal in
size. The visualized skeletal structures are unremarkable.
IMPRESSION: Examination is not significantly changed compared to prior, perhaps
with minimal bibasilar atelectasis or scarring. There is no new or
focal airspace opacity.

## 2019-02-12 MED ORDER — ALBUTEROL SULFATE HFA 108 (90 BASE) MCG/ACT IN AERS
INHALATION_SPRAY | RESPIRATORY_TRACT | Status: AC
  Filled 2019-02-12: qty 6.7

## 2019-02-12 MED ORDER — MONTELUKAST SODIUM 10 MG PO TABS: 10.0000 mg | ORAL_TABLET | Freq: Every day | ORAL | 2 refills | Status: DC

## 2019-02-12 MED ORDER — ALBUTEROL SULFATE HFA 108 (90 BASE) MCG/ACT IN AERS: 2.0000 | INHALATION_SPRAY | Freq: Once | RESPIRATORY_TRACT | Status: DC

## 2019-02-12 NOTE — ED Triage Notes (Signed)
Patient is concerned for pneumonia, wants to make sure it is cleared.   Finished antibiotic yesterday Continues to feel weak, headache and dizziness.  Breathing has improved, feels she has improved overall, just wants to make sure she is clear.

## 2019-02-12 NOTE — ED Provider Notes (Signed)
[facility]    CSN: [id number] Arrival date & time: 02/12/19  1020      History   Chief Complaint Chief Complaint  Patient presents with  . Follow-up    HPI [name] [name] is a 33 y.o. female.   HPI  Presents for follow-up chest x-ray. Patient has been diagnosed and treated with pneumonia twice since March 2020. She is a non-smoker and has no known history of asthma. Endorses severe seasonal allergies. She feels symptoms have completely resolved. She is not wheezing or experiencing shortness of breath. Recently COVID-19 tested with negative results. She is without a PCP. Past Medical History:  Diagnosis Date  . Anxiety   . Depression   . GERD (gastroesophageal reflux disease)   . History of kidney stones     Patient Active Problem List   Diagnosis Date Noted  . MDD (major depressive disorder), recurrent severe, without psychosis (HCC) 01/24/2017  . Suicidal ideation 01/24/2017  . MDD (major depressive disorder), recurrent episode (HCC) 01/23/2017    Past Surgical History:  Procedure Laterality Date  . CYSTOSCOPY WITH RETROGRADE PYELOGRAM, URETEROSCOPY AND STENT PLACEMENT Right 12/10/2016   Procedure: CYSTOSCOPY WITH RETROGRADE PYELOGRAM, URETEROSCOPY AND STONE REMOVAL;  Surgeon: Heloise PurpuraBorden, Lester, MD;  Location: [facility];  Service: Urology;  Laterality: Right;  . WISDOM TOOTH EXTRACTION  at age 33    OB History   No obstetric history on file.      Home Medications    Prior to Admission medications   Medication Sig Start Date End Date Taking? Authorizing Provider  ALPRAZolam Prudy Feeler(XANAX) 0.5 MG tablet Take 0.5 mg by mouth at bedtime as needed for anxiety.   Yes [provider]  cholecalciferol (VITAMIN D) 1000 units tablet Take 1,000 Units by mouth at bedtime.   Yes [provider]  cyanocobalamin 1000 MCG tablet Take 1,000 mcg by mouth at bedtime.    Yes [provider]  escitalopram (LEXAPRO) 10 MG tablet Take 1 tablet (10 mg  total) by mouth at bedtime. For depression Patient taking differently: Take 20 mg by mouth at bedtime. For depression 01/28/17  Yes Denzil Magnusonhomas, Lash[place], NP  fluticasone [facility](FLONASE) 50 MCG/ACT nasal spray Place 1 spray into both nostrils 3 times/day as needed-between meals & bedtime for allergies or rhinitis.    Yes [provider]  lamoTRIgine (LAMICTAL) 100 MG tablet Take 100 mg by mouth daily.   Yes [provider]  lithium carbonate 150 MG capsule Take 150 mg by mouth 3 (three) times daily with meals.   Yes [provider]  montelukast (SINGULAIR) 10 MG tablet Take 10 mg by mouth at bedtime.   Yes [provider]  Multiple Vitamin (MULTIVITAMIN) tablet Take 1 tablet by mouth daily.   Yes [provider]  Cetirizine HCl 10 MG CAPS Take 1 capsule (10 mg total) by mouth daily for 10 days. 01/30/19 02/09/19  Wieters, Hallie C, PA-C  diphenhydrAMINE (BENADRYL) 25 MG tablet Take 50 mg by mouth every 6 (six) hours as needed for itching or allergies.    [provider]  fluconazole (DIFLUCAN) 150 MG tablet Take one tablet by mouth.  May repeat in 3 days if symptoms persist. 01/30/19   Wieters, Hallie C, PA-C  HYDROcodone-homatropine (HYCODAN) 5-1.5 MG/5ML syrup Take 5 mLs by mouth every 6 (six) hours as needed for cough. 10/16/18   Shaune PollackIsaacs, Cameron, MD  hydrOXYzine (ATARAX/VISTARIL) 25 MG tablet Take 1 tablet (25 mg total) by mouth every 6 (six) hours. 04/13/17  Montine Circle, PA-C  levonorgestrel (MIRENA) 20 MCG/24HR IUD 1 each by Intrauterine route once.    [provider]  loratadine (CLARITIN) 10 MG tablet Take 10 mg by mouth at bedtime.    [provider]  ondansetron (ZOFRAN ODT) 4 MG disintegrating tablet Take 1 tablet (4 mg total) by mouth every 8 (eight) hours as needed for nausea or vomiting. 10/16/18   Duffy Bruce, MD  traZODone (DESYREL) 50 MG tablet Take 1 tablet (50 mg total) by mouth at bedtime as needed for sleep. Patient  not taking: Reported on 04/13/2017 01/28/17   Mordecai Maes, NP    Family History Family History  Problem Relation Age of Onset  . Depression Mother   . Anxiety disorder Mother     Social History Social History   Tobacco Use  . Smoking status: Never Smoker  . Smokeless tobacco: Never Used  Substance Use Topics  . Alcohol use: Yes    Alcohol/week: 1.0 standard drinks    Types: 1 Glasses of wine per week    Comment: per week  . Drug use: No     Allergies   Wellbutrin [bupropion]   Review of Systems Review of Systems Pertinent negatives listed in HPI Physical Exam Triage Vital Signs ED Triage Vitals  Enc Vitals Group     BP 02/12/19 1103 102/75     Pulse Rate 02/12/19 1103 87     Resp 02/12/19 1103 20     Temp 02/12/19 1103 99 F (37.2 C)     Temp src --      SpO2 02/12/19 1103 100 %     Weight --      Height --      Head Circumference --      Peak Flow --      Pain Score 02/12/19 1058 0     Pain Loc --      Pain Edu? --      Excl. in [place]? --    No data found.  Updated Vital Signs BP 102/75 (BP Location: Left Arm) Comment (BP Location): large cuff  Pulse 87   Temp 99 F (37.2 C)   Resp 20   SpO2 100%   Visual Acuity Right Eye Distance:   Left Eye Distance:   Bilateral Distance:    Right Eye Near:   Left Eye Near:    Bilateral Near:     Physical Exam General appearance: alert, well developed, well nourished, cooperative and in no distress Head: Normocephalic, without obvious abnormality, atraumatic Respiratory: Respirations even and unlabored, normal respiratory rate Heart: rate and rhythm normal. No gallop or murmurs noted on exam  Abdomen: BS +, no distention, no rebound tenderness, or no mass Extremities: No gross deformities Skin: Skin color, texture, turgor normal. No rashes seen  Psych: Appropriate mood and affect. Neurologic: Mental status: Alert, oriented to person, place, and time, thought content appropriate.  UC Treatments /  Results  Labs (all labs ordered are listed, but only abnormal results are displayed) Labs Reviewed - No data to display   Radiology Dg Chest 2 View  Result Date: 02/12/2019 CLINICAL DATA:  Recurrent pneumonia EXAM: CHEST - 2 VIEW COMPARISON:  01/30/2019 FINDINGS: Examination is not significantly changed compared to prior, perhaps with minimal bibasilar atelectasis or scarring. There is no new or focal airspace opacity. The heart and mediastinum are normal in size. The visualized skeletal structures are unremarkable. IMPRESSION: Examination is not significantly changed compared to prior, perhaps with minimal bibasilar atelectasis  or scarring. There is no new or focal airspace opacity. Electronically Signed   By: Lauralyn PrimesAlex  Bibbey M.D.   On: 02/12/2019 11:38    Procedures Procedures (including critical care time)  Medications Ordered in UC Medications - No data to display  Initial Impression / Assessment and Plan / UC Course  I have reviewed the triage vital signs and the nursing notes.  Pertinent labs & imaging results that were available during my care of the patient were reviewed by me and considered in my medical decision making (see chart for details).    Discussed the need to follow-up with a PCP. Chest x-ray is unchanged although symptoms have resolved. Will refill singulair and provide an albuterol inhaler for home use if shortness of breath or wheezing reoccurs. Provided information to establish care at [facility]. Red flags discussed. Return for follow-up if symptoms reoccur. Final Clinical Impressions(s) / UC Diagnoses   Final diagnoses:  Hx: recurrent pneumonia   Discharge Instructions   None    ED Prescriptions    Medication Sig Dispense Auth. Provider   montelukast (SINGULAIR) 10 MG tablet Take 1 tablet (10 mg total) by mouth at bedtime. 30 tablet Bing NeighborsHarris, Alisandra Son S, FNP     Controlled Substance Prescriptions [place] Controlled Substance Registry consulted? Not  Applicable   Bing NeighborsHarris, Emannuel Vise S, FNP 02/13/19 [phone]

## 2019-03-02 ENCOUNTER — Other Ambulatory Visit: Payer: Self-pay

## 2019-03-02 ENCOUNTER — Encounter: Payer: Self-pay | Admitting: Critical Care Medicine

## 2019-03-02 ENCOUNTER — Ambulatory Visit: Payer: BLUE CROSS/BLUE SHIELD | Attending: Critical Care Medicine | Admitting: Critical Care Medicine

## 2019-03-02 VITALS — BP 116/79 | HR 91 | Ht 64.0 in | Wt 267.8 lb

## 2019-03-02 DIAGNOSIS — F319 Bipolar disorder, unspecified: Secondary | ICD-10-CM

## 2019-03-02 DIAGNOSIS — J4540 Moderate persistent asthma, uncomplicated: Secondary | ICD-10-CM | POA: Diagnosis not present

## 2019-03-02 DIAGNOSIS — J3089 Other allergic rhinitis: Secondary | ICD-10-CM

## 2019-03-02 DIAGNOSIS — J984 Other disorders of lung: Secondary | ICD-10-CM

## 2019-03-02 DIAGNOSIS — Z77120 Contact with and (suspected) exposure to mold (toxic): Secondary | ICD-10-CM | POA: Insufficient documentation

## 2019-03-02 DIAGNOSIS — J189 Pneumonia, unspecified organism: Secondary | ICD-10-CM

## 2019-03-02 HISTORY — DX: Pneumonia, unspecified organism: J18.9

## 2019-03-02 HISTORY — DX: Other disorders of lung: J98.4

## 2019-03-02 MED ORDER — FLUTICASONE PROPIONATE 50 MCG/ACT NA SUSP: 2.0000 | Freq: Every day | NASAL | 1 refills | Status: DC

## 2019-03-02 MED ORDER — FLOVENT HFA 110 MCG/ACT IN AERO: 2.0000 | INHALATION_SPRAY | Freq: Two times a day (BID) | RESPIRATORY_TRACT | 12 refills | Status: DC

## 2019-03-02 MED ORDER — CETIRIZINE HCL 10 MG PO CAPS: 10.0000 mg | ORAL_CAPSULE | Freq: Every day | ORAL | 2 refills | Status: DC

## 2019-03-02 MED ORDER — ALBUTEROL SULFATE HFA 108 (90 BASE) MCG/ACT IN AERS: 2.0000 | INHALATION_SPRAY | Freq: Four times a day (QID) | RESPIRATORY_TRACT | 0 refills | Status: DC | PRN

## 2019-03-02 MED ORDER — ESCITALOPRAM OXALATE 10 MG PO TABS: 20.0000 mg | ORAL_TABLET | Freq: Every day | ORAL | Status: DC

## 2019-03-02 NOTE — Progress Notes (Signed)
Subjective:    Patient ID: [name] [name], female    DOB: 08-17-1985, 33 y.o.   MRN: [id number]  This is a 33 year old female referred for evaluation of asthma from urgent care.  The patient history dates back to early March of this year when she developed a febrile illness which sounded very much like COVID.  At that time we were not doing community testing and we do not know whether she had positive COVID or not.  She had sore throat cough nasal congestion shortness of breath chest and back pain muscle aches associated wheezing.  Chest x-ray did show pneumonitis when she was in the emergency room in March.  She was treated with antibiotics and symptomatic support.  Follow-up x-rays at urgent care 2 additional visit showed bibasilar groundglass opacities she was given additional courses of antibiotics to include doxycycline she was given benzonatate sertraline and rest and fluids.  The patient continues to have symptoms nevertheless.  She does not have a prior history of asthma.  She does note wheezing fatigue shortness of breath dizziness.  She is had no fever.  Note with her last urgent care visit in June her COVID test was negative.  The patient has nasal congestion weakness in the limbs as well.  Note she lives in an apartment with leaking water faucets and mold in her bathroom and a leak in the roof.  The landlord has not been willing to make changes in the apartment to help with this condition.  She notes daily sinus pressure and sinus congestion and posterior and anterior nasal drainage.  Last chest x-ray still showed a process in the right and left lower lobes. The patient does have a history of bipolar and is anxious to some degree.  She has smoke exposure in the home.  The patient herself is not a smoker.  She was working at [employer] but has been on leave since March of this year.   The patient's psychiatrist is Dr. Karel JarvisLarissa King  Shortness of Breath This is a recurrent problem. The  current episode started more than 1 month ago. The problem occurs daily (dyspnea at rest and exertion, no awake from sleep). The problem has been unchanged. Associated symptoms include ear pain, headaches, orthopnea, rhinorrhea and wheezing. Pertinent negatives include no abdominal pain, chest pain, fever, hemoptysis, leg pain, leg swelling, PND, rash, sore throat or swollen glands. The symptoms are aggravated by animal exposure, emotional upset, smoke, URIs, fumes, odors, pollens, weather changes, any activity and exercise. Associated symptoms comments: Notes ear pain and sinus congestion Throat is tight. She has tried beta agonist inhalers for the symptoms. The treatment provided moderate relief. Her past medical history is significant for allergies and pneumonia. There is no history of asthma, CAD, DVT, a heart failure or PE.    Past Medical History:  Diagnosis Date   Anxiety    Depression    GERD (gastroesophageal reflux disease)    History of kidney stones      Family History  Problem Relation Age of Onset   Depression Mother    Anxiety disorder Mother      Social History   Socioeconomic History   Marital status: Married    Spouse name: Not on file   Number of children: Not on file   Years of education: Not on file   Highest education level: Not on file  Occupational History   Not on file  Social Needs   Financial resource strain: Not on file  Food insecurity    Worry: Not on file    Inability: Not on file   Transportation needs    Medical: Not on file    Non-medical: Not on file  Tobacco Use   Smoking status: Never Smoker   Smokeless tobacco: Never Used  Substance and Sexual Activity   Alcohol use: Yes    Alcohol/week: 1.0 standard drinks    Types: 1 Glasses of wine per week    Comment: per week   Drug use: No   Sexual activity: Yes    Birth control/protection: I.U.D.  Lifestyle   Physical activity    Days per week: Not on file    Minutes  per session: Not on file   Stress: Not on file  Relationships   Social connections    Talks on phone: Not on file    Gets together: Not on file    Attends religious service: Not on file    Active member of club or organization: Not on file    Attends meetings of clubs or organizations: Not on file    Relationship status: Not on file   Intimate partner violence    Fear of current or ex partner: Not on file    Emotionally abused: Not on file    Physically abused: Not on file    Forced sexual activity: Not on file  Other Topics Concern   Not on file  Social History Narrative   Not on file     Allergies  Allergen Reactions   Wellbutrin [Bupropion] Hives     Outpatient Medications Prior to Visit  Medication Sig Dispense Refill   ALPRAZolam (XANAX) 0.5 MG tablet Take 0.5 mg by mouth at bedtime as needed for anxiety.     cholecalciferol (VITAMIN D) 1000 units tablet Take 1,000 Units by mouth at bedtime.     cyanocobalamin 1000 MCG tablet Take 1,000 mcg by mouth at bedtime.      diphenhydrAMINE (BENADRYL) 25 MG tablet Take 50 mg by mouth every 6 (six) hours as needed for itching or allergies.     lamoTRIgine (LAMICTAL) 100 MG tablet Take 200 mg by mouth daily.      levonorgestrel (MIRENA) 20 MCG/24HR IUD 1 each by Intrauterine route once.     lithium carbonate 150 MG capsule Take 150 mg by mouth 3 (three) times daily with meals.     montelukast (SINGULAIR) 10 MG tablet Take 1 tablet (10 mg total) by mouth at bedtime. 30 tablet 2   Multiple Vitamin (MULTIVITAMIN) tablet Take 1 tablet by mouth daily.     ondansetron (ZOFRAN ODT) 4 MG disintegrating tablet Take 1 tablet (4 mg total) by mouth every 8 (eight) hours as needed for nausea or vomiting. 20 tablet 0   escitalopram (LEXAPRO) 10 MG tablet Take 1 tablet (10 mg total) by mouth at bedtime. For depression (Patient taking differently: Take 20 mg by mouth at bedtime. For depression) 30 tablet 1   loratadine (CLARITIN)  10 MG tablet Take 10 mg by mouth at bedtime.     Cetirizine HCl 10 MG CAPS Take 1 capsule (10 mg total) by mouth daily for 10 days. (Patient not taking: Reported on 03/02/2019) 10 capsule 0   fluconazole (DIFLUCAN) 150 MG tablet Take one tablet by mouth.  May repeat in 3 days if symptoms persist. (Patient not taking: Reported on 03/02/2019) 2 tablet 0   fluticasone (FLONASE) 50 MCG/ACT nasal spray Place 1 spray into both nostrils 3 times/day as needed-between meals &  bedtime for allergies or rhinitis.      HYDROcodone-homatropine (HYCODAN) 5-1.5 MG/5ML syrup Take 5 mLs by mouth every 6 (six) hours as needed for cough. (Patient not taking: Reported on 03/02/2019) 120 mL 0   hydrOXYzine (ATARAX/VISTARIL) 25 MG tablet Take 1 tablet (25 mg total) by mouth every 6 (six) hours. (Patient not taking: Reported on 03/02/2019) 12 tablet 0   traZODone (DESYREL) 50 MG tablet Take 1 tablet (50 mg total) by mouth at bedtime as needed for sleep. (Patient not taking: Reported on 04/13/2017) 30 tablet 1   No facility-administered medications prior to visit.     Review of Systems  Constitutional: Positive for fatigue. Negative for fever.  HENT: Positive for ear pain, rhinorrhea, sinus pressure and sinus pain. Negative for ear discharge, facial swelling, hearing loss, nosebleeds, postnasal drip, sore throat, tinnitus and trouble swallowing.   Eyes: Positive for itching. Negative for photophobia.  Respiratory: Positive for cough, chest tightness, shortness of breath and wheezing. Negative for hemoptysis.   Cardiovascular: Positive for orthopnea. Negative for chest pain, leg swelling and PND.  Gastrointestinal: Negative for abdominal pain.       No GERD now  Genitourinary: Negative.   Musculoskeletal: Positive for myalgias.  Skin: Negative for rash.  Neurological: Positive for tremors, weakness, light-headedness and headaches. Negative for dizziness, seizures and syncope.  Hematological: Negative for adenopathy.  Bruises/bleeds easily.  Psychiatric/Behavioral: Positive for agitation, decreased concentration and dysphoric mood. Negative for confusion, self-injury and suicidal ideas. The patient is nervous/anxious and is hyperactive.    C   Objective:   Physical Exam Vitals:   03/02/19 1052  BP: 116/79  Pulse: 91  SpO2: 98%  Weight: 267 lb 12.8 oz (121.5 kg)  Height: 5\' 4"  (1.626 m)    Gen: Pleasant, well-nourished, in no distress,  normal affect  ENT: No lesions,  mouth clear,  oropharynx clear, 2+ postnasal drip Mild nasal turbinate edema   Neck: No JVD, no TMG, no carotid bruits  Lungs: No use of accessory muscles, no dullness to percussion, clear without rales or rhonchi  Cardiovascular: RRR, heart sounds normal, no murmur or gallops, no peripheral edema  Abdomen: soft and NT, no HSM,  BS normal  Musculoskeletal: No deformities, no cyanosis or clubbing  Neuro: alert, non focal  Skin: Warm, no lesions or rashes  All lab data and x-rays were reviewed in epic      Assessment & Plan:  I personally reviewed all images and lab data in the [facility] system as well as any outside material available during this office visit and agree with the  radiology impressions.   Asthma, moderate persistent The patient symptom complex is most compatible with moderate persistent asthma and probable hypersensitivity secondary to mold exposure.  Also worried she had hypersensitivity pneumonitis and can concerned about ongoing pneumonitis in the lung based on the most recent chest x-ray.  The patient does have out of network insurance and we discussed that she will need ancillary testing including pulmonary functions and a CT scan of the chest along with lab data including an allergy profile and IgE levels.  Patient will also need to begin inhaled steroid Flovent 110 strength HFA 2 puffs twice daily continue albuterol as needed  We will avoid systemic steroids due to the bipolar    Allergic rhinitis  caused by mold Severe allergic rhinitis caused by mold exposure again will begin Claritin and Flonase daily and continue the Singulair  Pneumonitis Ongoing active pneumonitis on recent chest x-ray  Would like to obtain a CT scan of the chest and then placed an order for this pending insurance approval  Mold suspected exposure Significant mold exposure likely in the home I suggested the patient consider relocating to a different apartment complex  Bipolar 1 disorder, depressed (HCC) The patient is under excellent management with psychiatry and will hold off systemic steroids so we do not trigger her bipolar   [name] was seen today for asthma and extremity weakness.  Diagnoses and all orders for this visit:  Moderate persistent asthma without complication -     Allergen Profile, Mold -     IgE -     CT Chest Wo Contrast; Future -     Pulmonary Function Test; Future  Pneumonitis -     Allergen Profile, Mold -     IgE -     CT Chest Wo Contrast; Future -     Pulmonary Function Test; Future  Allergic rhinitis caused by mold -     Allergen Profile, Mold -     IgE -     Pulmonary Function Test; Future  Mold suspected exposure -     Allergen Profile, Mold -     IgE  Bipolar 1 disorder, depressed (HCC)  Other orders -     escitalopram (LEXAPRO) 10 MG tablet; Take 2 tablets (20 mg total) by mouth at bedtime. For depression -     fluticasone (FLONASE) 50 MCG/ACT nasal spray; Place 2 sprays into both nostrils daily. -     fluticasone (FLOVENT HFA) 110 MCG/ACT inhaler; Inhale 2 puffs into the lungs 2 (two) times a day. -     albuterol (VENTOLIN HFA) 108 (90 Base) MCG/ACT inhaler; Inhale 2 puffs into the lungs every 6 (six) hours as needed for wheezing or shortness of breath. -     Cetirizine HCl 10 MG CAPS; Take 1 capsule (10 mg total) by mouth daily for 10 days.

## 2019-03-02 NOTE — Assessment & Plan Note (Signed)
The patient symptom complex is most compatible with moderate persistent asthma and probable hypersensitivity secondary to mold exposure.  Also worried she had hypersensitivity pneumonitis and can concerned about ongoing pneumonitis in the lung based on the most recent chest x-ray.  The patient does have out of network insurance and we discussed that she will need ancillary testing including pulmonary functions and a CT scan of the chest along with lab data including an allergy profile and IgE levels.  Patient will also need to begin inhaled steroid Flovent 110 strength HFA 2 puffs twice daily continue albuterol as needed  We will avoid systemic steroids due to the bipolar

## 2019-03-02 NOTE — Assessment & Plan Note (Signed)
The patient is under excellent management with psychiatry and will hold off systemic steroids so we do not trigger her bipolar

## 2019-03-02 NOTE — Assessment & Plan Note (Signed)
Ongoing active pneumonitis on recent chest x-ray  Would like to obtain a CT scan of the chest and then placed an order for this pending insurance approval

## 2019-03-02 NOTE — Assessment & Plan Note (Signed)
Significant mold exposure likely in the home I suggested the patient consider relocating to a different apartment complex

## 2019-03-02 NOTE — Patient Instructions (Signed)
Begin Flovent two puff twice daily Use proventil as needed 2 puff every 6hours Start claritin one daily Start flonase two puff each nostril daily  A CT Chest will be obtained to evaluate for pneumonitis  A lung function test is recommended  Labs today: mold assay study  Return 1 month with Dr Joya Gaskins

## 2019-03-02 NOTE — Assessment & Plan Note (Signed)
Severe allergic rhinitis caused by mold exposure again will begin Claritin and Flonase daily and continue the Singulair

## 2019-03-03 ENCOUNTER — Telehealth: Payer: Self-pay | Admitting: *Deleted

## 2019-03-03 NOTE — Telephone Encounter (Signed)
Prior Auth for patient CT of the chest was completed and approval number is [id number] and it is good from March 02, 2019 until Aug 28, 2019. Appt for CT was scheduled with [facility] (in network with patient insurance) and it was scheduled for Mar 15, 2019 at 2 pm arriving at 1:45. Patient was called and information was given to her and she verbalized understanding. Staff called patient insurance to see if patient PFT needed Prior Auth. Per Merry Proud, PFT does not need prior auth and reference number is [id number]. Staff called PFT scheduling and scheduled patient PFT for Aug 7th at 1:00pm. Staff called patient and informed her with time and date and location. Patient verbalized understanding. Staff also informed patient she will need to get tested for Covid 19 before her PFT and patient verbalized understanding and agreed to go to [employer] through either today or tomorrow.

## 2019-03-05 LAB — ALLERGEN PROFILE, MOLD
Alternaria Alternata IgE: <0.1 kU/L
Aspergillus Fumigatus IgE: <0.1 kU/L
Aureobasidi Pullulans IgE: <0.1 kU/L
Candida Albicans IgE: <0.1 kU/L
Cladosporium Herbarum IgE: <0.1 kU/L
M009-IgE Fusarium proliferatum: <0.1 kU/L
M014-IgE Epicoccum purpur: <0.1 kU/L
Mucor Racemosus IgE: <0.1 kU/L
Penicillium Chrysogen IgE: <0.1 kU/L
Phoma Betae IgE: <0.1 kU/L
Setomelanomma Rostrat: <0.1 kU/L
Stemphylium Herbarum IgE: <0.1 kU/L

## 2019-03-05 LAB — IGE: IgE (Immunoglobulin E), Serum: <2 IU/mL — ABNORMAL LOW (ref 6–495)

## 2019-03-08 ENCOUNTER — Other Ambulatory Visit (HOSPITAL_COMMUNITY)
Admission: RE | Admit: 2019-03-08 | Discharge: 2019-03-08 | Disposition: A | Discharge status: Home / Self Care | Payer: BLUE CROSS/BLUE SHIELD | Source: Ambulatory Visit | Attending: Critical Care Medicine | Admitting: Critical Care Medicine

## 2019-03-08 DIAGNOSIS — Z20828 Contact with and (suspected) exposure to other viral communicable diseases: Secondary | ICD-10-CM | POA: Insufficient documentation

## 2019-03-08 DIAGNOSIS — Z01812 Encounter for preprocedural laboratory examination: Secondary | ICD-10-CM | POA: Diagnosis not present

## 2019-03-08 LAB — SARS CORONAVIRUS 2 (TAT 6-24 HRS): SARS Coronavirus 2: NEGATIVE

## 2019-03-11 ENCOUNTER — Ambulatory Visit (HOSPITAL_COMMUNITY)
Admission: RE | Admit: 2019-03-11 | Discharge: 2019-03-11 | Disposition: A | Discharge status: Home / Self Care | Payer: BLUE CROSS/BLUE SHIELD | Source: Ambulatory Visit | Attending: Critical Care Medicine | Admitting: Critical Care Medicine

## 2019-03-11 ENCOUNTER — Other Ambulatory Visit: Payer: Self-pay

## 2019-03-11 ENCOUNTER — Telehealth: Payer: Self-pay | Admitting: General Practice

## 2019-03-11 DIAGNOSIS — J4540 Moderate persistent asthma, uncomplicated: Secondary | ICD-10-CM | POA: Diagnosis present

## 2019-03-11 DIAGNOSIS — J3089 Other allergic rhinitis: Secondary | ICD-10-CM | POA: Diagnosis present

## 2019-03-11 DIAGNOSIS — J189 Pneumonia, unspecified organism: Secondary | ICD-10-CM | POA: Insufficient documentation

## 2019-03-11 LAB — PULMONARY FUNCTION TEST
DL/VA % pred: 108 %
DL/VA: 4.91 ml/min/mmHg/L
DLCO unc % pred: 112 %
DLCO unc: 25.18 ml/min/mmHg
FEF 25-75 Post: 2.88 L/sec
FEF 25-75 Pre: 2.3 L/sec
FEF2575-%Change-Post: 25 %
FEF2575-%Pred-Post: 85 %
FEF2575-%Pred-Pre: 68 %
FEV1-%Change-Post: 7 %
FEV1-%Pred-Post: 98 %
FEV1-%Pred-Pre: 91 %
FEV1-Post: 3.09 L
FEV1-Pre: 2.88 L
FEV1FVC-%Change-Post: 4 %
FEV1FVC-%Pred-Pre: 89 %
FEV6-%Change-Post: 3 %
FEV6-%Pred-Post: 106 %
FEV6-%Pred-Pre: 103 %
FEV6-Post: 3.97 L
FEV6-Pre: 3.85 L
FEV6FVC-%Change-Post: 0 %
FEV6FVC-%Pred-Post: 101 %
FEV6FVC-%Pred-Pre: 101 %
FVC-%Change-Post: 2 %
FVC-%Pred-Post: 105 %
FVC-%Pred-Pre: 102 %
FVC-Post: 3.97 L
FVC-Pre: 3.86 L
Post FEV1/FVC ratio: 78 %
Post FEV6/FVC ratio: 100 %
Pre FEV1/FVC ratio: 75 %
Pre FEV6/FVC Ratio: 100 %
RV % pred: 124 %
RV: 1.8 L
TLC % pred: 112 %
TLC: 5.69 L

## 2019-03-11 MED ORDER — ALBUTEROL SULFATE (2.5 MG/3ML) 0.083% IN NEBU
2.5000 mg | INHALATION_SOLUTION | Freq: Once | RESPIRATORY_TRACT | Status: AC
  Administered 2019-03-11: 2.5 mg via RESPIRATORY_TRACT

## 2019-03-11 NOTE — Telephone Encounter (Signed)
Pt would like a letter stating that due to the mold in the house, conditions are not appropriate for her to live in..please follow up

## 2019-03-11 NOTE — Telephone Encounter (Signed)
Please ask patient if she can bring in documentation/proof of presence of mold in her house

## 2019-03-14 NOTE — Telephone Encounter (Signed)
Attempted to call pt, LVM informing to call back.

## 2019-03-14 NOTE — Telephone Encounter (Signed)
Patient called wanting to know what proof she should bring. Please follow up.

## 2019-03-15 ENCOUNTER — Ambulatory Visit
Admission: RE | Admit: 2019-03-15 | Discharge: 2019-03-15 | Disposition: A | Discharge status: Home / Self Care | Payer: BLUE CROSS/BLUE SHIELD | Source: Ambulatory Visit | Attending: Critical Care Medicine | Admitting: Critical Care Medicine

## 2019-03-15 ENCOUNTER — Encounter: Payer: Self-pay | Admitting: Critical Care Medicine

## 2019-03-15 ENCOUNTER — Other Ambulatory Visit: Payer: Self-pay

## 2019-03-15 DIAGNOSIS — J189 Pneumonia, unspecified organism: Secondary | ICD-10-CM

## 2019-03-15 DIAGNOSIS — J4540 Moderate persistent asthma, uncomplicated: Secondary | ICD-10-CM

## 2019-03-15 IMAGING — CT CT CHEST WITHOUT CONTRAST
1 of 2 series · 15 of 32 positions shown, 19 images · non-contrast
Comparison: None

CLINICAL DATA: Shortness of breath and recurrent pneumonia.

EXAM:
CT CHEST WITHOUT CONTRAST
TECHNIQUE: Multidetector CT imaging of the chest was performed following the
standard protocol without IV contrast.

[Series 2: chest w/(date) · axial · 0.79mm/px · z∈[-297,-49]mm · 15 of 144 slices shown, 19 images]
[im 10/144  mediastinal]
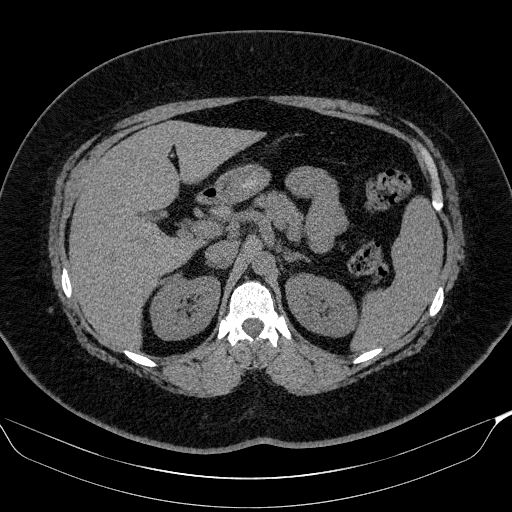
[im 10/144  lung]
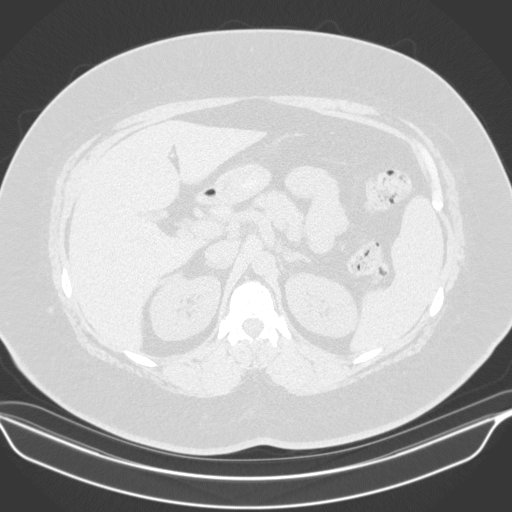
[im 20/144  lung]
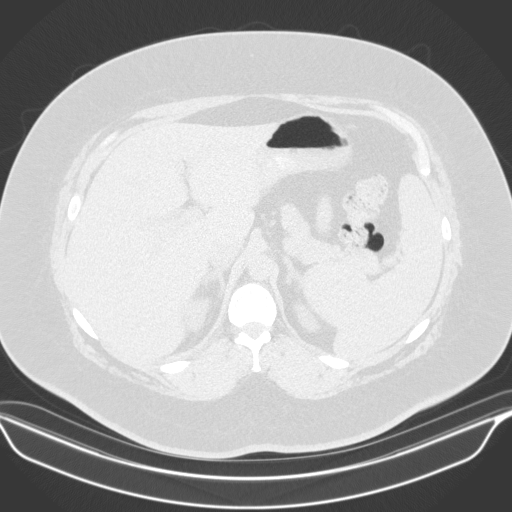
[im 29/144  lung]
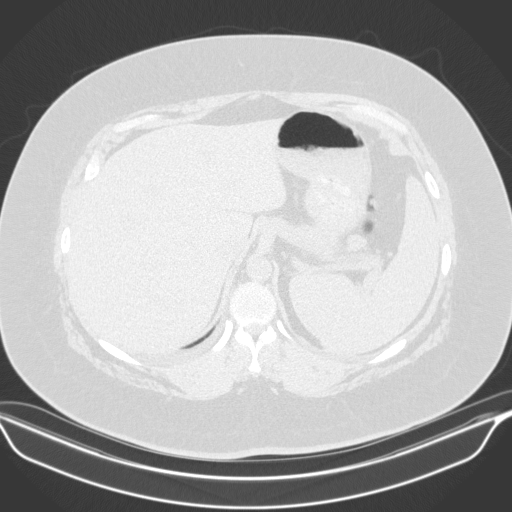
[im 39/144  lung]
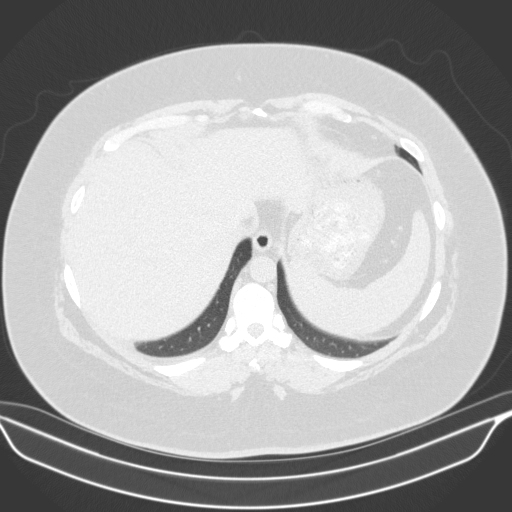
[im 48/144  mediastinal]
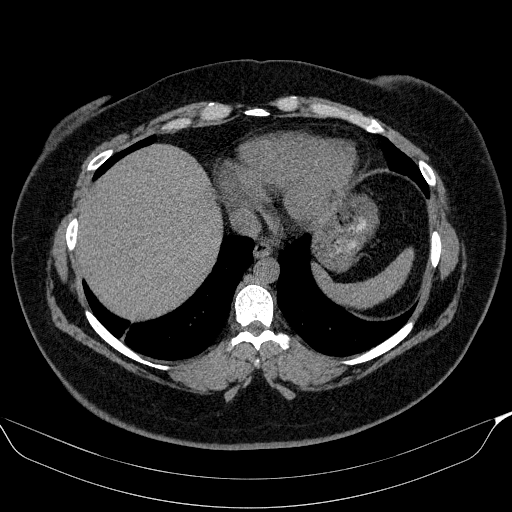
[im 48/144  lung]
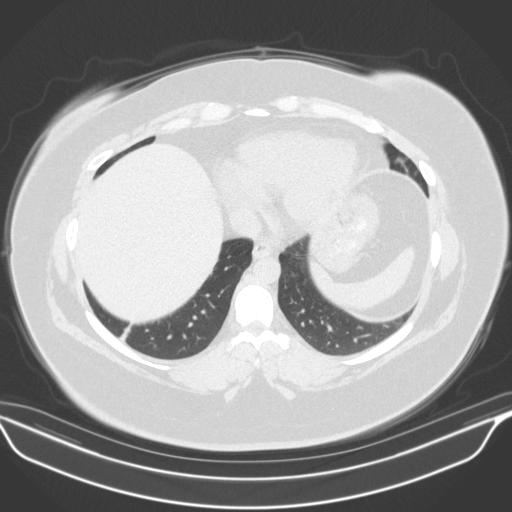
[im 58/144  lung]
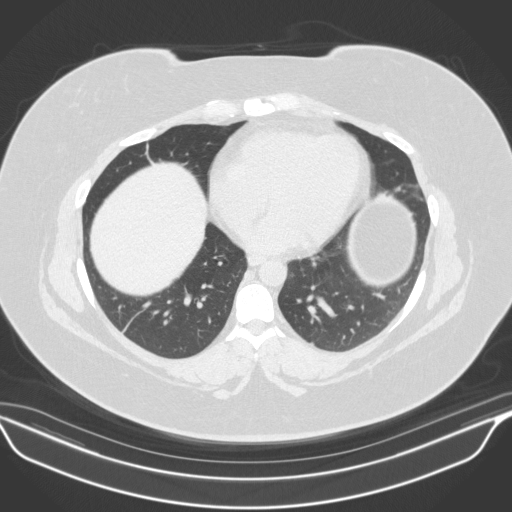
[im 67/144  lung]
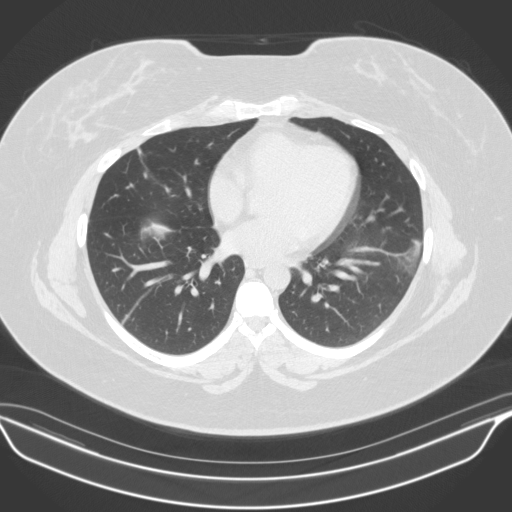
[im 72/144  lung]
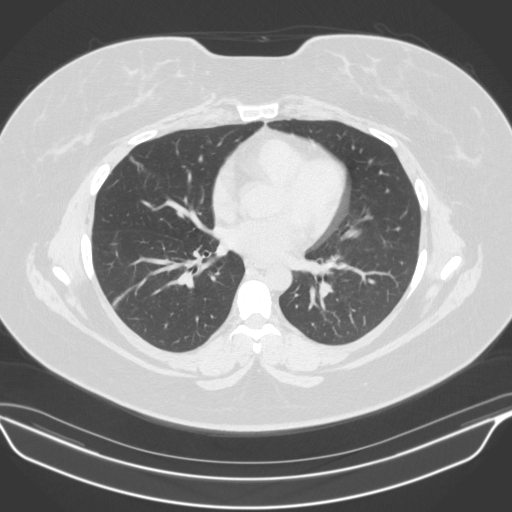
[im 77/144  mediastinal]
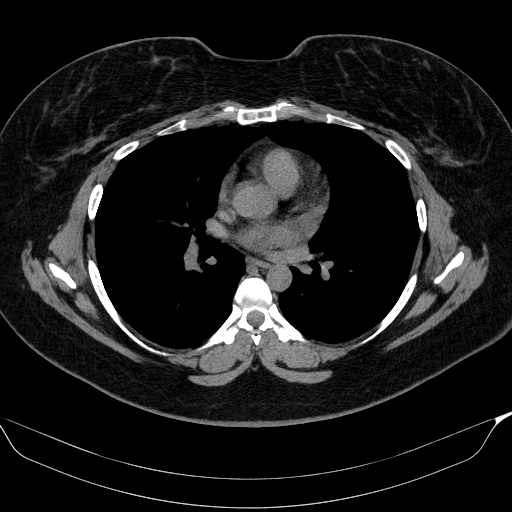
[im 77/144  lung]
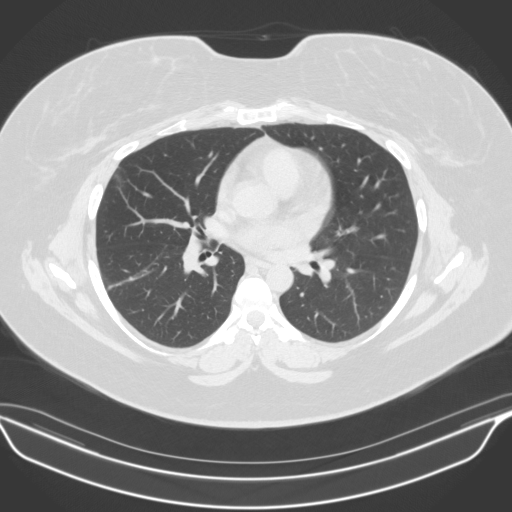
[im 86/144  lung]
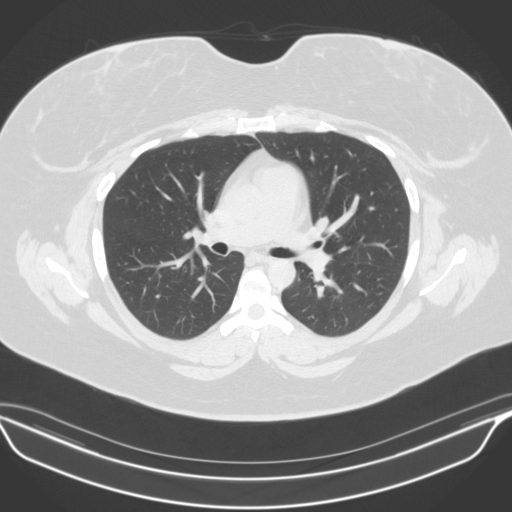
[im 96/144  lung]
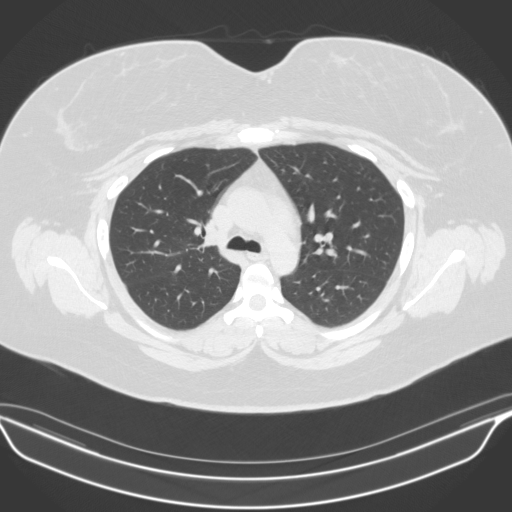
[im 105/144  lung]
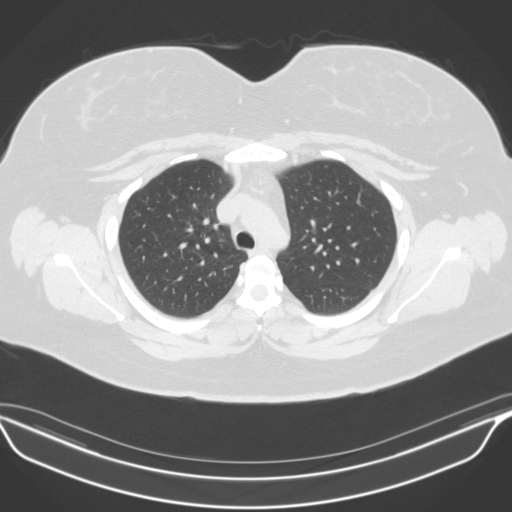
[im 115/144  mediastinal]
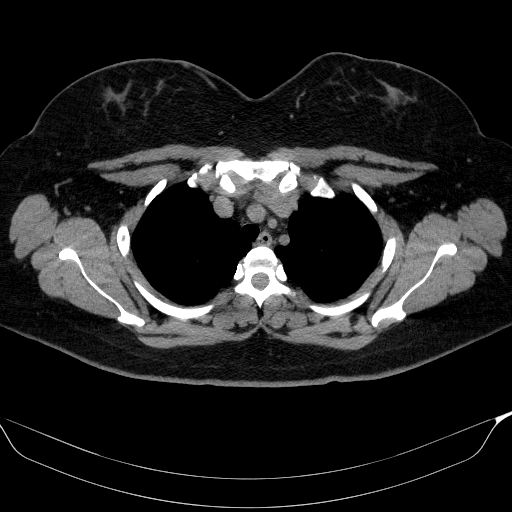
[im 115/144  lung]
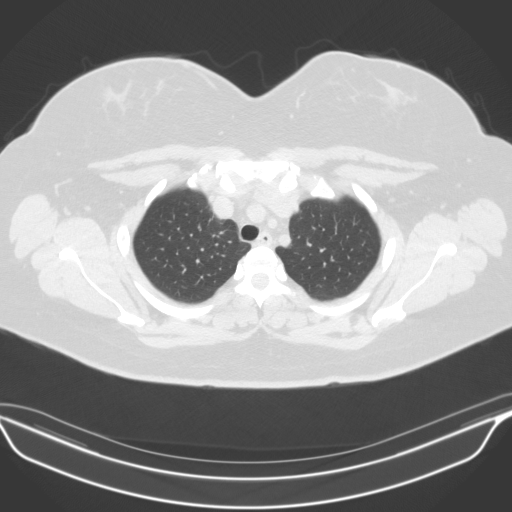
[im 124/144  lung]
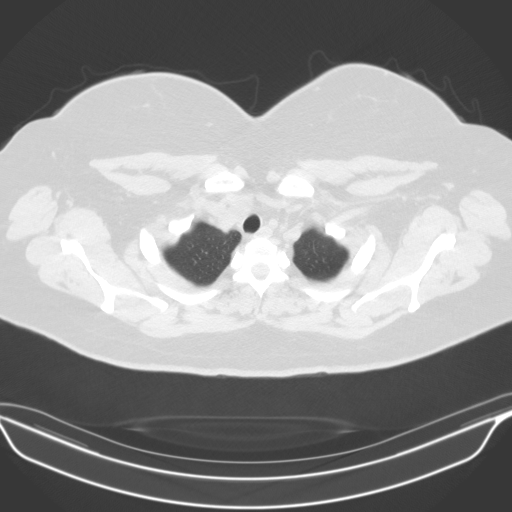
[im 134/144  lung]
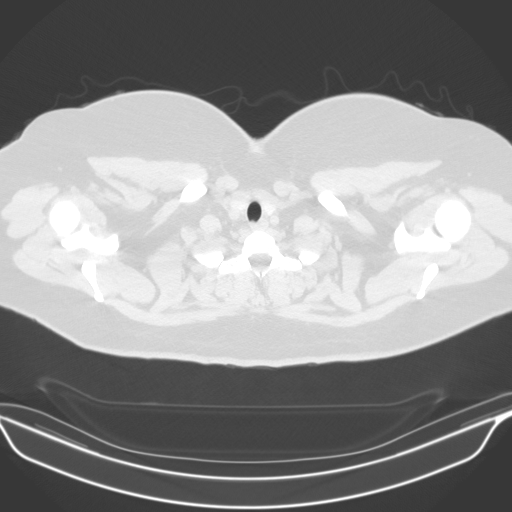

[15 of 32 positions shown; findings below may reference images not displayed]

FINDINGS: Cardiovascular: Normal heart size.  No pericardial effusion.

Mediastinum/Nodes: No enlarged mediastinal or axillary lymph nodes.
Thyroid gland, trachea, and esophagus demonstrate no significant
findings.

Lungs/Pleura: No pleural effusion identified. Linear densities
identified within the lingula, right middle lobe and right lower
lobe. Findings either represent areas of subsegmental atelectasis or
postinflammatory/infectious scarring. No airspace consolidation
identified.

Upper Abdomen: No acute abnormality.

Musculoskeletal: No chest wall mass or suspicious bone lesions
identified.
IMPRESSION: 1. No evidence for pneumonia.
2. Linear densities within the right middle lobe, lingula and right
lower lobe may represent areas of post inflammatory atelectasis or
scarring.

## 2019-03-15 NOTE — Telephone Encounter (Signed)
Informed patient with what provider wanted and she verbalized understanding. Per pt she will upload pictures of the locations of the mold in her home and send them via her mychart for provider to see them.

## 2019-03-17 ENCOUNTER — Telehealth: Payer: Self-pay | Admitting: *Deleted

## 2019-03-17 NOTE — Telephone Encounter (Signed)
Patient request results for CT chest.  Please advise

## 2019-03-18 NOTE — Telephone Encounter (Signed)
Results routed to you by Dr. Joya Gaskins.

## 2019-03-18 NOTE — Telephone Encounter (Signed)
Called the patient and lvm for here to call back and be rescheduled. It was cancled because [facility] has requested the day off.

## 2019-03-18 NOTE — Telephone Encounter (Signed)
-----   Message from Carilyn Goodpasture, RN sent at 03/17/2019  6:23 PM EDT ----- Regarding: please schedule an appointment Patient states she got a call stating her appointment was canceled- 04/04/2019 with Dr. Joya Gaskins. Unsure why...  Please assist in rescheduling

## 2019-03-21 ENCOUNTER — Telehealth: Payer: Self-pay | Admitting: General Practice

## 2019-03-21 NOTE — Telephone Encounter (Signed)
Routing message to Carilyn Goodpasture, RN

## 2019-03-21 NOTE — Telephone Encounter (Signed)
Pt called to get lab results, please follow up

## 2019-03-22 ENCOUNTER — Telehealth: Payer: Self-pay

## 2019-03-22 ENCOUNTER — Telehealth: Payer: Self-pay | Admitting: Critical Care Medicine

## 2019-03-22 ENCOUNTER — Encounter: Payer: Self-pay | Admitting: Critical Care Medicine

## 2019-03-22 NOTE — Telephone Encounter (Signed)
Attempted to contact patient # [phone]  to discuss legal aid referral. Message left with call back requested to this CM # [phone]

## 2019-03-22 NOTE — Telephone Encounter (Signed)
Pt name and DOB verified. Patient aware of results and result note per Dr. Joya Gaskins. Patient states she has been made aware of results per Dr. Joya Gaskins and aware of OV.
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
## 2019-03-24 ENCOUNTER — Other Ambulatory Visit: Payer: Self-pay | Admitting: Critical Care Medicine

## 2019-04-04 ENCOUNTER — Ambulatory Visit: Payer: BLUE CROSS/BLUE SHIELD | Admitting: Critical Care Medicine

## 2019-04-06 ENCOUNTER — Other Ambulatory Visit: Payer: Self-pay

## 2019-04-06 ENCOUNTER — Ambulatory Visit: Payer: BLUE CROSS/BLUE SHIELD | Attending: Critical Care Medicine | Admitting: Critical Care Medicine

## 2019-04-06 ENCOUNTER — Telehealth: Payer: Self-pay

## 2019-04-06 ENCOUNTER — Encounter: Payer: Self-pay | Admitting: Critical Care Medicine

## 2019-04-06 VITALS — BP 114/76 | HR 86 | Temp 98.3°F | Ht 65.0 in | Wt 269.0 lb

## 2019-04-06 DIAGNOSIS — J4540 Moderate persistent asthma, uncomplicated: Secondary | ICD-10-CM

## 2019-04-06 DIAGNOSIS — J189 Pneumonia, unspecified organism: Secondary | ICD-10-CM | POA: Diagnosis not present

## 2019-04-06 DIAGNOSIS — J3089 Other allergic rhinitis: Secondary | ICD-10-CM

## 2019-04-06 DIAGNOSIS — Z77120 Contact with and (suspected) exposure to mold (toxic): Secondary | ICD-10-CM

## 2019-04-06 NOTE — Patient Instructions (Signed)
A spacer device was given for your flovent inhaler  No change in medications  Return in 4 months  Work on getting out of your current apt.

## 2019-04-06 NOTE — Assessment & Plan Note (Signed)
Allergic rhinitis caused by mold  Plan to be for the patient to continue the Flonase and Zyrtec

## 2019-04-06 NOTE — Assessment & Plan Note (Signed)
Ongoing mold exposure  Patient is planning on getting out of the current environment
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
## 2019-04-06 NOTE — Telephone Encounter (Signed)
Met with the patient when she was in the clinic today.  She explained that she spoketo a representative from [employer] [facility] [place] about her housing concerns. She said that she was instructed to contact the city housing department to report the living conditions but she has not done so.  She said that she plans to call today. She noted that she has not yet paid her rent for September.  She and her spouse have no income. She said that she found a new place to live and would like to move as soon as possible but has a current lease through October.  She would like to know more information about breaking the lease.  [employer] email sent to Trinda Pascal Perez/Legal Aid requesting that a representative contact her to address her questions.  Also provided her with information from the [place] about help with paying rent/utilities during the pandemic.

## 2019-04-06 NOTE — Assessment & Plan Note (Signed)
Pneumonitis has resolved on CT scan of the chest and pulmonary functions

## 2019-04-06 NOTE — Assessment & Plan Note (Signed)
Moderate persistent asthma with normal spirometry lung volumes and diffusion capacity and CT scan of the chest showing some obstruction but largely normal  Plan will be for the patient to receive an AeroChamber for the Flovent and maintain Flovent 2 inhalations twice daily 110 mcg strength

## 2019-04-20 ENCOUNTER — Telehealth: Payer: Self-pay

## 2019-04-20 NOTE — Telephone Encounter (Signed)
As per [name] [name], [employer] [facility] , they continue to work with the patient.  

## 2019-04-28 ENCOUNTER — Other Ambulatory Visit: Payer: Self-pay | Admitting: Critical Care Medicine

## 2019-05-12 ENCOUNTER — Other Ambulatory Visit: Payer: Self-pay | Admitting: Critical Care Medicine

## 2019-05-22 ENCOUNTER — Other Ambulatory Visit: Payer: Self-pay | Admitting: Critical Care Medicine

## 2019-05-25 ENCOUNTER — Telehealth: Payer: Self-pay

## 2019-05-25 NOTE — Telephone Encounter (Signed)
As per Abelino Derrick, [employer] [facility] [place], they continue to work with her.

## 2019-06-22 ENCOUNTER — Telehealth: Payer: Self-pay

## 2019-06-22 NOTE — Telephone Encounter (Signed)
As per Eustace Quail, [employer] [facility] [place], they continue to work with the patient.

## 2019-07-27 ENCOUNTER — Other Ambulatory Visit: Payer: Self-pay

## 2019-07-27 ENCOUNTER — Encounter: Payer: Self-pay | Admitting: Critical Care Medicine

## 2019-07-27 ENCOUNTER — Ambulatory Visit: Payer: BLUE CROSS/BLUE SHIELD | Attending: Critical Care Medicine | Admitting: Critical Care Medicine

## 2019-07-27 DIAGNOSIS — F332 Major depressive disorder, recurrent severe without psychotic features: Secondary | ICD-10-CM

## 2019-07-27 DIAGNOSIS — J4540 Moderate persistent asthma, uncomplicated: Secondary | ICD-10-CM | POA: Diagnosis not present

## 2019-07-27 MED ORDER — MONTELUKAST SODIUM 10 MG PO TABS: 10.0000 mg | ORAL_TABLET | Freq: Every day | ORAL | 2 refills | Status: DC

## 2019-07-27 MED ORDER — ALBUTEROL SULFATE HFA 108 (90 BASE) MCG/ACT IN AERS: 2.0000 | INHALATION_SPRAY | Freq: Four times a day (QID) | RESPIRATORY_TRACT | 1 refills | Status: DC | PRN

## 2019-07-27 MED ORDER — FLUTICASONE PROPIONATE 50 MCG/ACT NA SUSP: NASAL | 3 refills | Status: DC

## 2019-07-27 NOTE — Progress Notes (Signed)
Subjective:    Patient ID: [name] [name], female    DOB: 20-Aug-1985, 33 y.o.   MRN: [id number] Virtual Visit via Telephone Note  I connected with [name] [name] on 07/27/19 at 10:30 AM EST by telephone and verified that I am speaking with the correct person using two identifiers.   Consent:  I discussed the limitations, risks, security and privacy concerns of performing an evaluation and management service by telephone and the availability of in person appointments. I also discussed with the patient that there may be a patient responsible charge related to this service. The patient expressed understanding and agreed to proceed.  Location of patient: The patient was at home  Location of provider: I was in office  Persons participating in the televisit with the patient.   No one else on the call     History of Present Illness: This is a 33 year old female referred for evaluation of asthma from urgent care.  The patient history dates back to early March of this year when she developed a febrile illness which sounded very much like COVID.  At that time we were not doing community testing and we do not know whether she had positive COVID or not.  She had sore throat cough nasal congestion shortness of breath chest and back pain muscle aches associated wheezing.  Chest x-ray did show pneumonitis when she was in the emergency room in March.  She was treated with antibiotics and symptomatic support.  Follow-up x-rays at urgent care 2 additional visit showed bibasilar groundglass opacities she was given additional courses of antibiotics to include doxycycline she was given benzonatate sertraline and rest and fluids.  The patient continues to have symptoms nevertheless.  She does not have a prior history of asthma.  She does note wheezing fatigue shortness of breath dizziness.  She is had no fever.  Note with her last urgent care visit in June her COVID test was negative.  The patient has nasal  congestion weakness in the limbs as well.  Note she lives in an apartment with leaking water faucets and mold in her bathroom and a leak in the roof.  The landlord has not been willing to make changes in the apartment to help with this condition.  She notes daily sinus pressure and sinus congestion and posterior and anterior nasal drainage.  Last chest x-ray still showed a process in the right and left lower lobes. The patient does have a history of bipolar and is anxious to some degree.  She has smoke exposure in the home.  The patient herself is not a smoker.  She was working at [employer] but has been on leave since March of this year.   The patient's psychiatrist is Dr. Karel JarvisLarissa King  04/06/2019 Since the last office visit the patient's dyspnea is improved.  She is coughing less.  She is working with legal aid to see if she can get out of her lease and move out of the apartment that currently is infested with mold.  The patient declines a flu vaccine at this visit   07/27/2019 Since the last visit in September the patient is continuing to improve.  This is a telephone visit She is having less cough only occasional shortness of breath.  She has had headache which is more pressure-like in nature.  She is needing refills on her Singulair.  She finally moved out of the mold in for stated home and is in a clean environment at this time.  She maintains Flovent twice daily 2 puffs and has an AeroChamber for this.   Shortness of Breath This is a recurrent problem. The current episode started more than 1 month ago. The problem occurs daily (dyspnea at rest and exertion, no awake from sleep). The problem has been unchanged. Associated symptoms include orthopnea. Pertinent negatives include no abdominal pain, chest pain, ear pain, fever, headaches, hemoptysis, leg pain, leg swelling, PND, rash, rhinorrhea, sore throat, swollen glands or wheezing. The symptoms are aggravated by animal exposure, emotional upset,  smoke, URIs, fumes, odors, pollens, weather changes, any activity and exercise. Associated symptoms comments: Notes ear pain and sinus congestion Throat is tight. She has tried beta agonist inhalers for the symptoms. The treatment provided moderate relief. Her past medical history is significant for allergies, asthma and pneumonia. There is no history of CAD, DVT, a heart failure or PE.  Asthma She complains of shortness of breath. There is no cough, hemoptysis or wheezing. Pertinent negatives include no chest pain, ear pain, fever, headaches, myalgias, PND, postnasal drip, rhinorrhea, sore throat or trouble swallowing. Her past medical history is significant for asthma and pneumonia.    Past Medical History:  Diagnosis Date  . Anxiety   . Depression   . GERD (gastroesophageal reflux disease)   . History of kidney stones   . Pneumonitis 03/02/2019     Family History  Problem Relation Age of Onset  . Depression Mother   . Anxiety disorder Mother      Social History   Socioeconomic History  . Marital status: Married    Spouse name: Not on file  . Number of children: Not on file  . Years of education: Not on file  . Highest education level: Not on file  Occupational History  . Not on file  Tobacco Use  . Smoking status: Never Smoker  . Smokeless tobacco: Never Used  Substance and Sexual Activity  . Alcohol use: Yes    Alcohol/week: 1.0 standard drinks    Types: 1 Glasses of wine per week    Comment: per week  . Drug use: No  . Sexual activity: Yes    Birth control/protection: I.U.D.  Other Topics Concern  . Not on file  Social History Narrative  . Not on file   Social Determinants of Health   Financial Resource Strain:   . Difficulty of Paying Living Expenses: Not on file  Food Insecurity:   . Worried About Programme researcher, broadcasting/film/video in the Last Year: Not on file  . Ran Out of Food in the Last Year: Not on file  Transportation Needs:   . Lack of Transportation (Medical):  Not on file  . Lack of Transportation (Non-Medical): Not on file  Physical Activity:   . Days of Exercise per Week: Not on file  . Minutes of Exercise per Session: Not on file  Stress:   . Feeling of Stress : Not on file  Social Connections:   . Frequency of Communication with Friends and Family: Not on file  . Frequency of Social Gatherings with Friends and Family: Not on file  . Attends Religious Services: Not on file  . Active Member of Clubs or Organizations: Not on file  . Attends Banker Meetings: Not on file  . Marital Status: Not on file  Intimate Partner Violence:   . Fear of Current or Ex-Partner: Not on file  . Emotionally Abused: Not on file  . Physically Abused: Not on file  . Sexually  Abused: Not on file     Allergies  Allergen Reactions  . Wellbutrin [Bupropion] Hives     Outpatient Medications Prior to Visit  Medication Sig Dispense Refill  . ALPRAZolam (XANAX) 0.5 MG tablet Take 0.5 mg by mouth at bedtime as needed for anxiety.    . cetirizine (ZYRTEC) 10 MG tablet TAKE 1 CAPSULE (10 MG TOTAL) BY MOUTH DAILY FOR 10 DAYS. 90 tablet 0  . cholecalciferol (VITAMIN D) 1000 units tablet Take 1,000 Units by mouth at bedtime.    . diphenhydrAMINE (BENADRYL) 25 MG tablet Take 50 mg by mouth every 6 (six) hours as needed for itching or allergies.    Marland Kitchen escitalopram (LEXAPRO) 10 MG tablet Take 2 tablets (20 mg total) by mouth at bedtime. For depression    . fluticasone (FLOVENT HFA) 110 MCG/ACT inhaler Inhale 2 puffs into the lungs 2 (two) times a day. 1 Inhaler 12  . lamoTRIgine (LAMICTAL) 100 MG tablet Take 200 mg by mouth daily.     Marland Kitchen levonorgestrel (MIRENA) 20 MCG/24HR IUD 1 each by Intrauterine route once.    . lithium carbonate 150 MG capsule Take 150 mg by mouth 3 (three) times daily with meals.    . Multiple Vitamin (MULTIVITAMIN) tablet Take 1 tablet by mouth daily.    . ondansetron (ZOFRAN ODT) 4 MG disintegrating tablet Take 1 tablet (4 mg total) by  mouth every 8 (eight) hours as needed for nausea or vomiting. 20 tablet 0  . pramipexole (MIRAPEX) 0.5 MG tablet     . albuterol (VENTOLIN HFA) 108 (90 Base) MCG/ACT inhaler Inhale 2 puffs into the lungs every 6 (six) hours as needed for wheezing or shortness of breath. 18 g 0  . fluticasone (FLONASE) 50 MCG/ACT nasal spray SPRAY 2 SPRAYS INTO EACH NOSTRIL EVERY DAY 48 mL 1  . montelukast (SINGULAIR) 10 MG tablet Take 1 tablet (10 mg total) by mouth at bedtime. 30 tablet 2  . ARIPiprazole (ABILIFY) 5 MG tablet Take 5 mg by mouth every morning.    . cyanocobalamin 1000 MCG tablet Take 1,000 mcg by mouth at bedtime.     Marland Kitchen loratadine (CLARITIN) 10 MG tablet Take 10 mg by mouth at bedtime.    . Cholecalciferol (VITAMIN D3) 50 MCG (2000 UT) capsule Take 2,000 Units by mouth daily.     No facility-administered medications prior to visit.    Review of Systems  Constitutional: Negative for fatigue and fever.  HENT: Negative for ear discharge, ear pain, facial swelling, hearing loss, nosebleeds, postnasal drip, rhinorrhea, sinus pressure, sinus pain, sore throat, tinnitus and trouble swallowing.   Eyes: Negative for photophobia and itching.  Respiratory: Positive for shortness of breath. Negative for cough, hemoptysis, chest tightness and wheezing.   Cardiovascular: Positive for orthopnea. Negative for chest pain, leg swelling and PND.  Gastrointestinal: Negative for abdominal pain.       No GERD now  Genitourinary: Negative.   Musculoskeletal: Negative for myalgias.  Skin: Negative for rash.  Neurological: Negative for dizziness, tremors, seizures, syncope, weakness, light-headedness and headaches.  Hematological: Negative for adenopathy. Does not bruise/bleed easily.  Psychiatric/Behavioral: Negative for agitation, confusion, decreased concentration, dysphoric mood, self-injury and suicidal ideas. The patient is not nervous/anxious and is not hyperactive.    C   Objective:   Physical  Exam  There were no vitals filed for this visit.  This is a phone visit there was no observations    Assessment & Plan:  I personally reviewed all images and  lab data in the [facility] system as well as any outside material available during this office visit and agree with the  radiology impressions.   Asthma, moderate persistent Marked improvement and moderate persistent asthma caused by mold since moving out of the mold infested apartment  Continue Flovent and Flonase and as needed albuterol we will also refill the Singulair  MDD (major depressive disorder), recurrent severe, without psychosis (HCC) Depression and anxiety are markedly improved along with stabilization of her bipolar disorder with her recent mental health providers interventions  Patient to maintain Abilify, Lexapro, Lamictal, and Mirapex along with lithium   [name] [name] was seen today for asthma.  Diagnoses and all orders for this visit:  Moderate persistent asthma without complication  MDD (major depressive disorder), recurrent severe, without psychosis (HCC)  Other orders -     albuterol (VENTOLIN HFA) 108 (90 Base) MCG/ACT inhaler; Inhale 2 puffs into the lungs every 6 (six) hours as needed for wheezing or shortness of breath. -     fluticasone (FLONASE) 50 MCG/ACT nasal spray; SPRAY 2 SPRAYS INTO EACH NOSTRIL EVERY DAY -     montelukast (SINGULAIR) 10 MG tablet; Take 1 tablet (10 mg total) by mouth at bedtime.    Follow Up Instructions: The patient knows a follow-up office visit will be in early February   I discussed the assessment and treatment plan with the patient. The patient was provided an opportunity to ask questions and all were answered. The patient agreed with the plan and demonstrated an understanding of the instructions.   The patient was advised to call back or seek an in-person evaluation if the symptoms worsen or if the condition fails to improve as anticipated.  I provided of  non-face-to-face time during this encounter  including  median intraservice time , review of notes, labs, imaging, medications  and explaining diagnosis and management to the patient .    Shan Levans, MD

## 2019-07-27 NOTE — Assessment & Plan Note (Signed)
Depression and anxiety are markedly improved along with stabilization of her bipolar disorder with her recent mental health providers interventions  Patient to maintain Abilify, Lexapro, Lamictal, and Mirapex along with lithium

## 2019-07-27 NOTE — Progress Notes (Signed)
Having headaches. °

## 2019-07-27 NOTE — Assessment & Plan Note (Signed)
Marked improvement and moderate persistent asthma caused by mold since moving out of the mold infested apartment  Continue Flovent and Flonase and as needed albuterol we will also refill the Singulair

## 2019-08-09 ENCOUNTER — Ambulatory Visit: Payer: BLUE CROSS/BLUE SHIELD | Admitting: Critical Care Medicine

## 2019-08-31 ENCOUNTER — Telehealth: Payer: Self-pay

## 2019-08-31 NOTE — Telephone Encounter (Signed)
As per Vernona Rieger Marsh/[employer] [facility] [place], the case has been closed and the patient has moved.

## 2019-09-06 ENCOUNTER — Encounter: Payer: Self-pay | Admitting: Critical Care Medicine

## 2019-09-06 ENCOUNTER — Other Ambulatory Visit: Payer: Self-pay

## 2019-09-06 ENCOUNTER — Ambulatory Visit: Payer: BLUE CROSS/BLUE SHIELD | Attending: Critical Care Medicine | Admitting: Critical Care Medicine

## 2019-09-06 VITALS — BP 113/80 | HR 74 | Temp 97.3°F | Resp 16 | Wt 280.0 lb

## 2019-09-06 DIAGNOSIS — Z6841 Body Mass Index (BMI) 40.0 and over, adult: Secondary | ICD-10-CM

## 2019-09-06 DIAGNOSIS — J3089 Other allergic rhinitis: Secondary | ICD-10-CM

## 2019-09-06 DIAGNOSIS — Z77120 Contact with and (suspected) exposure to mold (toxic): Secondary | ICD-10-CM

## 2019-09-06 DIAGNOSIS — J4540 Moderate persistent asthma, uncomplicated: Secondary | ICD-10-CM

## 2019-09-06 DIAGNOSIS — F319 Bipolar disorder, unspecified: Secondary | ICD-10-CM

## 2019-09-06 DIAGNOSIS — H60543 Acute eczematoid otitis externa, bilateral: Secondary | ICD-10-CM

## 2019-09-06 DIAGNOSIS — Z1331 Encounter for screening for depression: Secondary | ICD-10-CM

## 2019-09-06 MED ORDER — ALBUTEROL SULFATE HFA 108 (90 BASE) MCG/ACT IN AERS: 2.0000 | INHALATION_SPRAY | Freq: Four times a day (QID) | RESPIRATORY_TRACT | 1 refills | Status: DC | PRN

## 2019-09-06 MED ORDER — FLUTICASONE PROPIONATE 50 MCG/ACT NA SUSP: NASAL | 3 refills | Status: DC

## 2019-09-06 MED ORDER — FLOVENT HFA 110 MCG/ACT IN AERO: 2.0000 | INHALATION_SPRAY | Freq: Two times a day (BID) | RESPIRATORY_TRACT | 12 refills | Status: DC

## 2019-09-06 MED ORDER — TRIAMCINOLONE ACETONIDE 0.1 % EX CREA: 1.0000 "application " | TOPICAL_CREAM | Freq: Two times a day (BID) | CUTANEOUS | 2 refills | Status: DC

## 2019-09-06 MED ORDER — MONTELUKAST SODIUM 10 MG PO TABS: 10.0000 mg | ORAL_TABLET | Freq: Every day | ORAL | 2 refills | Status: DC

## 2019-09-06 NOTE — Assessment & Plan Note (Signed)
The patient is losing some weight on the [facility] cognitive behavioral therapy program  She is encouraged to continue this program

## 2019-09-06 NOTE — Patient Instructions (Signed)
Begin triamcinolone cream mixed with a skin moisturizer 2 times daily and both use in affected area in the right groin the prescription was sent to your pharmacy  Refills on your asthma medicine sent to your pharmacy  No other medication changes  Continue to follow your weight loss program  Return to see Dr. Delford Field 3 months

## 2019-09-06 NOTE — Assessment & Plan Note (Signed)
The patient is on a stable program for her bipolar and will follow up with her mental health provider

## 2019-09-06 NOTE — Progress Notes (Signed)
Subjective:    Patient ID: [name] [name], female    DOB: [birth year], 34 y.o.   MRN: [id number] History of Present Illness: This is a 34 year old female referred for evaluation of asthma from urgent care.  The patient history dates back to early March of this year when she developed a febrile illness which sounded very much like COVID.  At that time we were not doing community testing and we do not know whether she had positive COVID or not.  She had sore throat cough nasal congestion shortness of breath chest and back pain muscle aches associated wheezing.  Chest x-ray did show pneumonitis when she was in the emergency room in March.  She was treated with antibiotics and symptomatic support.  Follow-up x-rays at urgent care 2 additional visit showed bibasilar groundglass opacities she was given additional courses of antibiotics to include doxycycline she was given benzonatate sertraline and rest and fluids.  The patient continues to have symptoms nevertheless.  She does not have a prior history of asthma.  She does note wheezing fatigue shortness of breath dizziness.  She is had no fever.  Note with her last urgent care visit in June her COVID test was negative.  The patient has nasal congestion weakness in the limbs as well.  Note she lives in an apartment with leaking water faucets and mold in her bathroom and a leak in the roof.  The landlord has not been willing to make changes in the apartment to help with this condition.  She notes daily sinus pressure and sinus congestion and posterior and anterior nasal drainage.  Last chest x-ray still showed a process in the right and left lower lobes. The patient does have a history of bipolar and is anxious to some degree.  She has smoke exposure in the home.  The patient herself is not a smoker.  She was working at [employer] but has been on leave since March of this year.   The patient's psychiatrist is Dr. Karel Jarvis  04/06/2019 Since the last office  visit the patient's dyspnea is improved.  She is coughing less.  She is working with legal aid to see if she can get out of her lease and move out of the apartment that currently is infested with mold.  The patient declines a flu vaccine at this visit   07/27/2019 Since the last visit in September the patient is continuing to improve.  This is a telephone visit She is having less cough only occasional shortness of breath.  She has had headache which is more pressure-like in nature.  She is needing refills on her Singulair.  She finally moved out of the mold in for stated home and is in a clean environment at this time. She maintains Flovent twice daily 2 puffs and has an AeroChamber for this.  09/06/2019 Since last visit the patient's asthma is in improved control.  She maintains Flovent and Flonase and as needed albuterol along with the Singulair.  She has not had any exacerbations of her asthma since the last visit.  She is moved out of the apartment that was causing significant mold exposure and this is improved her overall situation The patient does have a follow-up with her mental health provider  She does complain of itching in both ears and has a history of eczema on the external ear and behind the ears as well as in the right groin  Asthma There is no cough, hemoptysis, shortness of breath or  wheezing. The problem has been rapidly improving. Pertinent negatives include no chest pain, ear pain, fever, headaches, myalgias, PND, postnasal drip, rhinorrhea, sore throat or trouble swallowing. Her past medical history is significant for asthma and pneumonia.    Past Medical History:  Diagnosis Date  . Anxiety   . Depression   . GERD (gastroesophageal reflux disease)   . History of kidney stones   . Pneumonitis 03/02/2019     Family History  Problem Relation Age of Onset  . Depression Mother   . Anxiety disorder Mother      Social History   Socioeconomic History  . Marital status:  Married    Spouse name: Not on file  . Number of children: Not on file  . Years of education: Not on file  . Highest education level: Not on file  Occupational History  . Not on file  Tobacco Use  . Smoking status: Never Smoker  . Smokeless tobacco: Never Used  Substance and Sexual Activity  . Alcohol use: Yes    Alcohol/week: 1.0 standard drinks    Types: 1 Glasses of wine per week    Comment: per week  . Drug use: No  . Sexual activity: Yes    Birth control/protection: I.U.D.  Other Topics Concern  . Not on file  Social History Narrative  . Not on file   Social Determinants of Health   Financial Resource Strain:   . Difficulty of Paying Living Expenses: Not on file  Food Insecurity:   . Worried About Charity fundraiser in the Last Year: Not on file  . Ran Out of Food in the Last Year: Not on file  Transportation Needs:   . Lack of Transportation (Medical): Not on file  . Lack of Transportation (Non-Medical): Not on file  Physical Activity:   . Days of Exercise per Week: Not on file  . Minutes of Exercise per Session: Not on file  Stress:   . Feeling of Stress : Not on file  Social Connections:   . Frequency of Communication with Friends and Family: Not on file  . Frequency of Social Gatherings with Friends and Family: Not on file  . Attends Religious Services: Not on file  . Active Member of Clubs or Organizations: Not on file  . Attends Archivist Meetings: Not on file  . Marital Status: Not on file  Intimate Partner Violence:   . Fear of Current or Ex-Partner: Not on file  . Emotionally Abused: Not on file  . Physically Abused: Not on file  . Sexually Abused: Not on file     Allergies  Allergen Reactions  . Wellbutrin [Bupropion] Hives     Outpatient Medications Prior to Visit  Medication Sig Dispense Refill  . ALPRAZolam (XANAX) 0.5 MG tablet Take 0.5 mg by mouth at bedtime as needed for anxiety.    . ARIPiprazole (ABILIFY) 5 MG tablet Take  5 mg by mouth every morning.    . cholecalciferol (VITAMIN D) 1000 units tablet Take 1,000 Units by mouth at bedtime.    . cyanocobalamin 1000 MCG tablet Take 1,000 mcg by mouth at bedtime.     . diphenhydrAMINE (BENADRYL) 25 MG tablet Take 50 mg by mouth every 6 (six) hours as needed for itching or allergies.    Marland Kitchen escitalopram (LEXAPRO) 10 MG tablet Take 2 tablets (20 mg total) by mouth at bedtime. For depression    . lamoTRIgine (LAMICTAL) 100 MG tablet Take 200 mg by mouth  daily.     . levonorgestrel (MIRENA) 20 MCG/24HR IUD 1 each by Intrauterine route once.    . lithium carbonate 150 MG capsule Take 150 mg by mouth 3 (three) times daily with meals.    . Multiple Vitamin (MULTIVITAMIN) tablet Take 1 tablet by mouth daily.    . ondansetron (ZOFRAN ODT) 4 MG disintegrating tablet Take 1 tablet (4 mg total) by mouth every 8 (eight) hours as needed for nausea or vomiting. 20 tablet 0  . pramipexole (MIRAPEX) 0.5 MG tablet     . albuterol (VENTOLIN HFA) 108 (90 Base) MCG/ACT inhaler Inhale 2 puffs into the lungs every 6 (six) hours as needed for wheezing or shortness of breath. 18 g 1  . cetirizine (ZYRTEC) 10 MG tablet TAKE 1 CAPSULE (10 MG TOTAL) BY MOUTH DAILY FOR 10 DAYS. 90 tablet 0  . fluticasone (FLONASE) 50 MCG/ACT nasal spray SPRAY 2 SPRAYS INTO EACH NOSTRIL EVERY DAY 16 g 3  . fluticasone (FLOVENT HFA) 110 MCG/ACT inhaler Inhale 2 puffs into the lungs 2 (two) times a day. 1 Inhaler 12  . montelukast (SINGULAIR) 10 MG tablet Take 1 tablet (10 mg total) by mouth at bedtime. 30 tablet 2   No facility-administered medications prior to visit.    Review of Systems  Constitutional: Negative for fatigue and fever.  HENT: Negative for ear discharge, ear pain, facial swelling, hearing loss, nosebleeds, postnasal drip, rhinorrhea, sinus pressure, sinus pain, sore throat, tinnitus and trouble swallowing.   Eyes: Negative for photophobia and itching.  Respiratory: Negative for cough, hemoptysis,  chest tightness, shortness of breath and wheezing.   Cardiovascular: Positive for orthopnea. Negative for chest pain, leg swelling and PND.  Gastrointestinal: Negative for abdominal pain.       No GERD now  Genitourinary: Negative.   Musculoskeletal: Negative for myalgias.  Skin: Negative for rash.  Neurological: Negative for dizziness, tremors, seizures, syncope, weakness, light-headedness and headaches.  Hematological: Negative for adenopathy. Does not bruise/bleed easily.  Psychiatric/Behavioral: Negative for agitation, confusion, decreased concentration, dysphoric mood, self-injury and suicidal ideas. The patient is not nervous/anxious and is not hyperactive.    C   Objective:   Physical Exam Vitals:   09/06/19 1029  BP: 113/80  Pulse: 74  Resp: 16  Temp: (!) 97.3 F (36.3 C)  SpO2: 100%  Weight: 280 lb (127 kg)   Vitals:   09/06/19 1029  BP: 113/80  Pulse: 74  Resp: 16  Temp: (!) 97.3 F (36.3 C)  SpO2: 100%  Weight: 280 lb (127 kg)    Gen: Pleasant, obese, in no distress,  normal affect  ENT: Eczema type areas of the external ears bilaterally and behind the right ear as well,  mouth clear,  oropharynx clear, no postnasal drip  Neck: No JVD, no TMG, no carotid bruits  Lungs: No use of accessory muscles, no dullness to percussion, clear without rales or rhonchi  Cardiovascular: RRR, heart sounds normal, no murmur or gallops, no peripheral edema  Abdomen: soft and NT, no HSM,  BS normal  Musculoskeletal: No deformities, no cyanosis or clubbing  Neuro: alert, non focal  Skin: Warm, no lesions or rashes Depression screen [facility] 2/9 09/06/2019 04/06/2019 03/02/2019  Decreased Interest 1 1 1   Down, Depressed, Hopeless 1 1 1   PHQ - 2 Score 2 2 2   Altered sleeping 0 1 1  Tired, decreased energy 1 2 2   Change in appetite 0 1 1  Feeling bad or failure about yourself  1 1  1  Trouble concentrating 1 2 1   Moving slowly or fidgety/restless 0 1 1  Suicidal thoughts 0 0 0    PHQ-9 Score 5 10 9   Difficult doing work/chores - - Somewhat difficult        Assessment & Plan:  I personally reviewed all images and lab data in the [facility] system as well as any outside material available during this office visit and agree with the  radiology impressions.   Asthma, moderate persistent Moderate persistent asthma and allergic rhinitis caused by mold markedly improved at this time  We will continue Flovent and as needed albuterol and Singulair as is also continue Flonase  Eczema of external ear, bilateral We will prescribe triamcinolone cream to affected areas mixed with a skin moisturizer  Bipolar 1 disorder, depressed (HCC) The patient is on a stable program for her bipolar and will follow up with her mental health provider  Class 3 severe obesity due to excess calories without serious comorbidity with body mass index (BMI) of 45.0 to 49.9 in adult [facility]) The patient is losing some weight on the [facility] cognitive behavioral therapy program  She is encouraged to continue this program   Diagnoses and all orders for this visit:  Moderate persistent asthma without complication  Allergic rhinitis caused by mold  Mold suspected exposure  Class 3 severe obesity due to excess calories without serious comorbidity with body mass index (BMI) of 45.0 to 49.9 in adult [facility])  Eczema of external ear, bilateral  Bipolar 1 disorder, depressed (HCC)  Other orders -     albuterol (VENTOLIN HFA) 108 (90 Base) MCG/ACT inhaler; Inhale 2 puffs into the lungs every 6 (six) hours as needed for wheezing or shortness of breath. -     fluticasone (FLONASE) 50 MCG/ACT nasal spray; SPRAY 2 SPRAYS INTO EACH NOSTRIL EVERY DAY -     fluticasone (FLOVENT HFA) 110 MCG/ACT inhaler; Inhale 2 puffs into the lungs 2 (two) times daily. -     montelukast (SINGULAIR) 10 MG tablet; Take 1 tablet (10 mg total) by mouth at bedtime. -     triamcinolone cream (KENALOG) 0.1 %; Apply 1 application topically  2 (two) times daily.

## 2019-09-06 NOTE — Progress Notes (Signed)
Follow up breathing Concerns with eczema in both ears

## 2019-09-06 NOTE — Assessment & Plan Note (Signed)
Moderate persistent asthma and allergic rhinitis caused by mold markedly improved at this time  We will continue Flovent and as needed albuterol and Singulair as is also continue Flonase

## 2019-09-06 NOTE — Assessment & Plan Note (Signed)
We will prescribe triamcinolone cream to affected areas mixed with a skin moisturizer

## 2019-09-21 ENCOUNTER — Telehealth: Payer: Self-pay | Admitting: Critical Care Medicine

## 2019-10-17 ENCOUNTER — Other Ambulatory Visit: Payer: Self-pay | Admitting: Critical Care Medicine

## 2019-10-17 NOTE — Progress Notes (Signed)
Subjective:    Patient ID: [name] [name], female    DOB: 1985-12-06, 34 y.o.   MRN: [id number] History of Present Illness: This is a 34 year old female referred for evaluation of asthma from urgent care.  The patient history dates back to early March of this year when she developed a febrile illness which sounded very much like COVID.  At that time we were not doing community testing and we do not know whether she had positive COVID or not.  She had sore throat cough nasal congestion shortness of breath chest and back pain muscle aches associated wheezing.  Chest x-ray did show pneumonitis when she was in the emergency room in March.  She was treated with antibiotics and symptomatic support.  Follow-up x-rays at urgent care 2 additional visit showed bibasilar groundglass opacities she was given additional courses of antibiotics to include doxycycline she was given benzonatate sertraline and rest and fluids.  The patient continues to have symptoms nevertheless.  She does not have a prior history of asthma.  She does note wheezing fatigue shortness of breath dizziness.  She is had no fever.  Note with her last urgent care visit in June her COVID test was negative.  The patient has nasal congestion weakness in the limbs as well.  Note she lives in an apartment with leaking water faucets and mold in her bathroom and a leak in the roof.  The landlord has not been willing to make changes in the apartment to help with this condition.  She notes daily sinus pressure and sinus congestion and posterior and anterior nasal drainage.  Last chest x-ray still showed a process in the right and left lower lobes. The patient does have a history of bipolar and is anxious to some degree.  She has smoke exposure in the home.  The patient herself is not a smoker.  She was working at [employer] but has been on leave since March of this year.   The patient's psychiatrist is Dr. Marni Griffon  04/06/2019 Since the last office  visit the patient's dyspnea is improved.  She is coughing less.  She is working with legal aid to see if she can get out of her lease and move out of the apartment that currently is infested with mold.  The patient declines a flu vaccine at this visit   07/27/2019 Since the last visit in September the patient is continuing to improve.  This is a telephone visit She is having less cough only occasional shortness of breath.  She has had headache which is more pressure-like in nature.  She is needing refills on her Singulair.  She finally moved out of the mold in for stated home and is in a clean environment at this time. She maintains Flovent twice daily 2 puffs and has an AeroChamber for this.  09/06/2019 Since last visit the patient's asthma is in improved control.  She maintains Flovent and Flonase and as needed albuterol along with the Singulair.  She has not had any exacerbations of her asthma since the last visit.  She is moved out of the apartment that was causing significant mold exposure and this is improved her overall situation The patient does have a follow-up with her mental health provider  She does complain of itching in both ears and has a history of eczema on the external ear and behind the ears as well as in the right groin  3/16: F/u of severe persistent asthma and PCP f/u  The patient notes despite some mild weight loss she is still having dyspnea particular with exertion on the treadmill.  The denies any cough but does note some wheezing.  Note she just recently received her first Covid vaccine injection See asthma assessment below  Asthma She complains of shortness of breath. There is no cough, hemoptysis or wheezing. Primary symptoms comments: Uses rescue inhaler and ? If helps. The problem has been gradually worsening. Associated symptoms include chest pain, dyspnea on exertion, heartburn, nasal congestion, postnasal drip, rhinorrhea, sneezing and a sore throat.  Pertinent negatives include no ear pain, fever, headaches, myalgias, orthopnea, PND or trouble swallowing. Her symptoms are aggravated by exercise, change in weather, exposure to fumes, pollen, exposure to smoke, strenuous activity, minimal activity, any activity and eating (smoke). Her past medical history is significant for asthma and pneumonia.    Past Medical History:  Diagnosis Date  . Anxiety   . Depression   . GERD (gastroesophageal reflux disease)   . History of kidney stones   . Pneumonitis 03/02/2019     Family History  Problem Relation Age of Onset  . Depression Mother   . Anxiety disorder Mother      Social History   Socioeconomic History  . Marital status: Married    Spouse name: Not on file  . Number of children: Not on file  . Years of education: Not on file  . Highest education level: Not on file  Occupational History  . Not on file  Tobacco Use  . Smoking status: Never Smoker  . Smokeless tobacco: Never Used  Substance and Sexual Activity  . Alcohol use: Yes    Alcohol/week: 1.0 standard drinks    Types: 1 Glasses of wine per week    Comment: per week  . Drug use: No  . Sexual activity: Yes    Birth control/protection: I.U.D.  Other Topics Concern  . Not on file  Social History Narrative  . Not on file   Social Determinants of Health   Financial Resource Strain:   . Difficulty of Paying Living Expenses:   Food Insecurity:   . Worried About Charity fundraiser in the Last Year:   . Arboriculturist in the Last Year:   Transportation Needs:   . Film/video editor (Medical):   Marland Kitchen Lack of Transportation (Non-Medical):   Physical Activity:   . Days of Exercise per Week:   . Minutes of Exercise per Session:   Stress:   . Feeling of Stress :   Social Connections:   . Frequency of Communication with Friends and Family:   . Frequency of Social Gatherings with Friends and Family:   . Attends Religious Services:   . Active Member of Clubs or  Organizations:   . Attends Archivist Meetings:   Marland Kitchen Marital Status:   Intimate Partner Violence:   . Fear of Current or Ex-Partner:   . Emotionally Abused:   Marland Kitchen Physically Abused:   . Sexually Abused:      Allergies  Allergen Reactions  . Wellbutrin [Bupropion] Hives     Outpatient Medications Prior to Visit  Medication Sig Dispense Refill  . albuterol (VENTOLIN HFA) 108 (90 Base) MCG/ACT inhaler Inhale 2 puffs into the lungs every 6 (six) hours as needed for wheezing or shortness of breath. 18 g 1  . ALPRAZolam (XANAX) 0.5 MG tablet Take 0.5 mg by mouth at bedtime as needed for anxiety.    . ARIPiprazole (ABILIFY) 5  MG tablet Take 5 mg by mouth every morning.    . cholecalciferol (VITAMIN D) 1000 units tablet Take 1,000 Units by mouth at bedtime.    . diphenhydrAMINE (BENADRYL) 25 MG tablet Take 50 mg by mouth every 6 (six) hours as needed for itching or allergies.    Marland Kitchen escitalopram (LEXAPRO) 10 MG tablet Take 2 tablets (20 mg total) by mouth at bedtime. For depression    . fluticasone (FLONASE) 50 MCG/ACT nasal spray SPRAY 2 SPRAYS INTO EACH NOSTRIL EVERY DAY 16 g 3  . fluticasone (FLOVENT HFA) 110 MCG/ACT inhaler Inhale 2 puffs into the lungs 2 (two) times daily. 1 Inhaler 12  . lamoTRIgine (LAMICTAL) 100 MG tablet Take 200 mg by mouth daily.     Marland Kitchen levonorgestrel (MIRENA) 20 MCG/24HR IUD 1 each by Intrauterine route once.    . lithium carbonate 150 MG capsule Take 150 mg by mouth 3 (three) times daily with meals.    . montelukast (SINGULAIR) 10 MG tablet TAKE 1 TABLET BY MOUTH EVERYDAY AT BEDTIME 90 tablet 0  . Multiple Vitamin (MULTIVITAMIN) tablet Take 1 tablet by mouth daily.    . ondansetron (ZOFRAN ODT) 4 MG disintegrating tablet Take 1 tablet (4 mg total) by mouth every 8 (eight) hours as needed for nausea or vomiting. 20 tablet 0  . pramipexole (MIRAPEX) 0.5 MG tablet     . triamcinolone cream (KENALOG) 0.1 % Apply 1 application topically 2 (two) times daily. 30 g 2   . cyanocobalamin 1000 MCG tablet Take 1,000 mcg by mouth at bedtime.      No facility-administered medications prior to visit.    Review of Systems  Constitutional: Negative for fatigue and fever.  HENT: Positive for postnasal drip, rhinorrhea, sneezing and sore throat. Negative for ear discharge, ear pain, facial swelling, hearing loss, nosebleeds, sinus pressure, sinus pain, tinnitus and trouble swallowing.   Eyes: Negative for photophobia and itching.  Respiratory: Positive for shortness of breath. Negative for cough, hemoptysis, chest tightness and wheezing.   Cardiovascular: Positive for chest pain and dyspnea on exertion. Negative for leg swelling and PND.  Gastrointestinal: Positive for heartburn. Negative for abdominal pain.       No GERD now  Genitourinary: Negative.   Musculoskeletal: Negative for myalgias.  Skin: Negative for rash.  Neurological: Negative for dizziness, tremors, seizures, syncope, weakness, light-headedness and headaches.  Hematological: Negative for adenopathy. Does not bruise/bleed easily.  Psychiatric/Behavioral: Negative for agitation, confusion, decreased concentration, dysphoric mood, self-injury and suicidal ideas. The patient is not nervous/anxious and is not hyperactive.    C   Objective:   Physical Exam  Vitals:   10/18/19 1407  BP: 122/78  Pulse: 97  Temp: 98.1 F (36.7 C)  TempSrc: Oral  SpO2: 99%  Weight: 278 lb 3.2 oz (126.2 kg)  Height: 5\' 5"  (1.651 m)    Gen: Pleasant, well-nourished, in no distress,  normal affect  ENT: No lesions,  mouth clear,  oropharynx clear, no postnasal drip  Neck: No JVD, no TMG, no carotid bruits  Lungs: No use of accessory muscles, no dullness to percussion, clear without rales or rhonchi  Cardiovascular: RRR, heart sounds normal, no murmur or gallops, no peripheral edema  Abdomen: soft and NT, no HSM,  BS normal  Musculoskeletal: No deformities, no cyanosis or clubbing  Neuro: alert, non  focal  Skin: Warm, no lesions or rashes  Note I educated this patient the proper use of a peak flow meter and issued her peak flow  meter to her at this visit.  I also went over with her her asthma action plan and put it in the AVS.  Her peak flow today was 400 therefore her green zone can go down to 320 her yellow zone is 320-200 red zone is below 200    Depression screen [facility] 2/9 10/18/2019 09/06/2019 04/06/2019 03/02/2019  Decreased Interest 0 1 1 1   Down, Depressed, Hopeless 0 1 1 1   PHQ - 2 Score 0 2 2 2   Altered sleeping 0 0 1 1  Tired, decreased energy 1 1 2 2   Change in appetite 0 0 1 1  Feeling bad or failure about yourself  1 1 1 1   Trouble concentrating 1 1 2 1   Moving slowly or fidgety/restless 0 0 1 1  Suicidal thoughts 0 0 0 0  PHQ-9 Score 3 5 10 9   Difficult doing work/chores Not difficult at all - - Somewhat difficult        Assessment & Plan:  I personally reviewed all images and lab data in the [facility] system as well as any outside material available during this office visit and agree with the  radiology impressions.   Asthma, moderate persistent Moderate persistent asthma stable at this time on Flovent will not change to long-acting beta agonist ICS combination  We will have the patient keep a diary with her peak flow rates and call results and I will see this patient back short-term  Patient to continue Singulair as well   Diagnoses and all orders for this visit:  Moderate persistent asthma without complication  Mold suspected exposure  Allergic rhinitis caused by mold  Class 3 severe obesity due to excess calories without serious comorbidity with body mass index (BMI) of 45.0 to 49.9 in adult [facility])

## 2019-10-18 ENCOUNTER — Ambulatory Visit: Payer: Self-pay | Attending: Critical Care Medicine | Admitting: Critical Care Medicine

## 2019-10-18 ENCOUNTER — Other Ambulatory Visit: Payer: Self-pay

## 2019-10-18 ENCOUNTER — Encounter: Payer: Self-pay | Admitting: Critical Care Medicine

## 2019-10-18 VITALS — BP 122/78 | HR 97 | Temp 98.1°F | Ht 65.0 in | Wt 278.2 lb

## 2019-10-18 DIAGNOSIS — Z77120 Contact with and (suspected) exposure to mold (toxic): Secondary | ICD-10-CM

## 2019-10-18 DIAGNOSIS — Z6841 Body Mass Index (BMI) 40.0 and over, adult: Secondary | ICD-10-CM

## 2019-10-18 DIAGNOSIS — J4540 Moderate persistent asthma, uncomplicated: Secondary | ICD-10-CM

## 2019-10-18 DIAGNOSIS — J3089 Other allergic rhinitis: Secondary | ICD-10-CM

## 2019-10-18 NOTE — Patient Instructions (Signed)
Stay on Flovent as you are taking and continue to use Singulair Zyrtec and increase Flonase to 2 sprays each nostril daily  Use albuterol 2 inhalations before any exercise  Monitor your peak flow rate and let us know if you fall in the 200 range persistently  Return to see Dr. Delford Field in 3 months  Asthma Action Plan for [name] [name]  Printed: 10/18/2019 Doctor's Name: Storm Frisk, MD, Phone Number: [phone]  My best peak flow is: 400  Please bring this plan and all your medications to each visit to our office or the emergency room.  GREEN ZONE: Doing Well  No cough, wheeze, chest tightness or shortness of breath during the day or night Can do your usual activities If a peak flow meter is used:peak flow: more than 320 (80% or more of my best)  Take these long-term-control medicines each day  Medicine How much to take When to take it  Flovent Two puff Twice a day                    Take these medicines before exercise if your asthma is exercise-induced  Medicine How much to take When to take it  albuterol (PROVENTIL,VENTOLIN) 2 puffs 15 minutes before exercise        YELLOW ZONE: Asthma is Getting Worse  Cough, wheeze, chest tightness or shortness of breath or Waking at night due to asthma, or Can do some, but not all, usual activities, or If a peak flow meter is used:peak flow:  200  to  320  (50-79% of my best peak flow)  First: Take quick-relief medicine - and keep taking your GREEN ZONE medicines  Take the albuterol (PROVENTIL,VENTOLIN) inhaler 2 puffs every 20 minutes for up to 1 hour.  Second: If your symptoms (and peak flows) return to Green Zone after 1 hour of above treatment, continue monitoring to be sure you stay in the green zone.          -Or,   If your symptoms (and peak flows) do not return to Green Zone after 1 hour of above treatment:  Take the albuterol (PROVENTIL,VENTOLIN) inhaler 2 puffs every 20 minutes for up to 1  hour.   RED ZONE: Medical Alert!  Very short of breath, or Quick relief medications have not helped, or Cannot do usual activities, or Symptoms are same or worse after 24 hours in the Yellow Zone, or If a peak flow meter is used: peak flow: less than  200 (50% or less of your best)  First, take these medicines:  Take the albuterol (PROVENTIL,VENTOLIN) inhaler 2 puffs every 20 minutes for up to 1 hour.  Then call your medical provider NOW! Go to the hospital or call an ambulance if: You are still in the Red Zone after 15 minutes, AND You have not reached your medical provider  DANGER SIGNS  Trouble walking and talking due to shortness of breath, or Lips or fingernails are blue  Take 4 puffs of your quick relief medicine, AND Go to the hospital or call for an ambulance (call 911) NOW!

## 2019-10-18 NOTE — Progress Notes (Signed)
Asthma

## 2019-10-18 NOTE — Assessment & Plan Note (Signed)
Moderate persistent asthma stable at this time on Flovent will not change to long-acting beta agonist ICS combination  We will have the patient keep a diary with her peak flow rates and call results and I will see this patient back short-term  Patient to continue Singulair as well

## 2019-10-31 ENCOUNTER — Other Ambulatory Visit: Payer: Self-pay

## 2019-10-31 ENCOUNTER — Ambulatory Visit: Payer: Self-pay | Attending: Critical Care Medicine

## 2019-11-25 MED FILL — FLOVENT HFA 110 MCG INHALER: 110 | 30 days supply | Qty: 12 | Fill #0

## 2019-12-06 ENCOUNTER — Ambulatory Visit: Payer: BLUE CROSS/BLUE SHIELD | Admitting: Critical Care Medicine

## 2019-12-27 MED FILL — FLOVENT HFA 110 MCG INHALER: 110 | 30 days supply | Qty: 12 | Fill #1

## 2019-12-30 MED FILL — lamoTRIgine 200 MG TABS: 200 | 30 days supply | Qty: 30 | Fill #0

## 2020-01-10 ENCOUNTER — Ambulatory Visit: Payer: Self-pay | Admitting: Critical Care Medicine

## 2020-01-11 ENCOUNTER — Ambulatory Visit: Payer: Self-pay | Attending: Critical Care Medicine | Admitting: Critical Care Medicine

## 2020-01-11 ENCOUNTER — Encounter: Payer: Self-pay | Admitting: Critical Care Medicine

## 2020-01-11 ENCOUNTER — Other Ambulatory Visit: Payer: Self-pay

## 2020-01-11 VITALS — BP 134/82 | HR 90 | Temp 98.1°F | Resp 16 | Ht 64.0 in | Wt 272.0 lb

## 2020-01-11 DIAGNOSIS — Z1159 Encounter for screening for other viral diseases: Secondary | ICD-10-CM

## 2020-01-11 DIAGNOSIS — F319 Bipolar disorder, unspecified: Secondary | ICD-10-CM

## 2020-01-11 DIAGNOSIS — Z975 Presence of (intrauterine) contraceptive device: Secondary | ICD-10-CM

## 2020-01-11 DIAGNOSIS — Z91018 Allergy to other foods: Secondary | ICD-10-CM

## 2020-01-11 DIAGNOSIS — Z77120 Contact with and (suspected) exposure to mold (toxic): Secondary | ICD-10-CM

## 2020-01-11 DIAGNOSIS — J3089 Other allergic rhinitis: Secondary | ICD-10-CM

## 2020-01-11 DIAGNOSIS — J4540 Moderate persistent asthma, uncomplicated: Secondary | ICD-10-CM

## 2020-01-11 DIAGNOSIS — Z6841 Body Mass Index (BMI) 40.0 and over, adult: Secondary | ICD-10-CM

## 2020-01-11 NOTE — Progress Notes (Signed)
Here for Asthma f /u /no recent flare ups

## 2020-01-11 NOTE — Progress Notes (Signed)
Subjective:    Patient ID: [name] [name], female    DOB: 1985-12-06, 34 y.o.   MRN: [id number] History of Present Illness: This is a 34 year old female referred for evaluation of asthma from urgent care.  The patient history dates back to early March of this year when she developed a febrile illness which sounded very much like COVID.  At that time we were not doing community testing and we do not know whether she had positive COVID or not.  She had sore throat cough nasal congestion shortness of breath chest and back pain muscle aches associated wheezing.  Chest x-ray did show pneumonitis when she was in the emergency room in March.  She was treated with antibiotics and symptomatic support.  Follow-up x-rays at urgent care 2 additional visit showed bibasilar groundglass opacities she was given additional courses of antibiotics to include doxycycline she was given benzonatate sertraline and rest and fluids.  The patient continues to have symptoms nevertheless.  She does not have a prior history of asthma.  She does note wheezing fatigue shortness of breath dizziness.  She is had no fever.  Note with her last urgent care visit in June her COVID test was negative.  The patient has nasal congestion weakness in the limbs as well.  Note she lives in an apartment with leaking water faucets and mold in her bathroom and a leak in the roof.  The landlord has not been willing to make changes in the apartment to help with this condition.  She notes daily sinus pressure and sinus congestion and posterior and anterior nasal drainage.  Last chest x-ray still showed a process in the right and left lower lobes. The patient does have a history of bipolar and is anxious to some degree.  She has smoke exposure in the home.  The patient herself is not a smoker.  She was working at [employer] but has been on leave since March of this year.   The patient's psychiatrist is Dr. Marni Griffon  04/06/2019 Since the last office  visit the patient's dyspnea is improved.  She is coughing less.  She is working with legal aid to see if she can get out of her lease and move out of the apartment that currently is infested with mold.  The patient declines a flu vaccine at this visit   07/27/2019 Since the last visit in September the patient is continuing to improve.  This is a telephone visit She is having less cough only occasional shortness of breath.  She has had headache which is more pressure-like in nature.  She is needing refills on her Singulair.  She finally moved out of the mold in for stated home and is in a clean environment at this time. She maintains Flovent twice daily 2 puffs and has an AeroChamber for this.  09/06/2019 Since last visit the patient's asthma is in improved control.  She maintains Flovent and Flonase and as needed albuterol along with the Singulair.  She has not had any exacerbations of her asthma since the last visit.  She is moved out of the apartment that was causing significant mold exposure and this is improved her overall situation The patient does have a follow-up with her mental health provider  She does complain of itching in both ears and has a history of eczema on the external ear and behind the ears as well as in the right groin  3/16: F/u of severe persistent asthma and PCP f/u  The patient notes despite some mild weight loss she is still having dyspnea particular with exertion on the treadmill.  The denies any cough but does note some wheezing.  Note she just recently received her first Covid vaccine injection See asthma assessment below  01/11/2020 Patient seen today in return follow-up and is doing well from the perspective of asthma.  She maintains Flovent inhaler and as needed albuterol.  She follows her peak flow meter and this has been very helpful in determining her stability.  She maintains Singulair daily.  She is stable with regards to her mental health conditions and  follows with psychiatry.  Note the patient does not have discovered food allergies including onions and wishes an allergy evaluation  Asthma There is no cough, hemoptysis, shortness of breath or wheezing. Primary symptoms comments: Uses rescue inhaler and ? If helps. The problem has been rapidly improving. Associated symptoms include postnasal drip and sneezing. Pertinent negatives include no chest pain, dyspnea on exertion, ear pain, fever, headaches, heartburn, myalgias, nasal congestion, orthopnea, PND, rhinorrhea, sore throat or trouble swallowing. Her symptoms are aggravated by exercise, change in weather, exposure to fumes, pollen, exposure to smoke, strenuous activity and eating (smoke). Her past medical history is significant for asthma and pneumonia.    Past Medical History:  Diagnosis Date  . Anxiety   . Depression   . GERD (gastroesophageal reflux disease)   . History of kidney stones   . Pneumonitis 03/02/2019     Family History  Problem Relation Age of Onset  . Depression Mother   . Anxiety disorder Mother      Social History   Socioeconomic History  . Marital status: Single    Spouse name: Not on file  . Number of children: Not on file  . Years of education: Not on file  . Highest education level: Not on file  Occupational History  . Not on file  Tobacco Use  . Smoking status: Never Smoker  . Smokeless tobacco: Never Used  Vaping Use  . Vaping Use: Never used  Substance and Sexual Activity  . Alcohol use: Yes    Alcohol/week: 1.0 standard drink    Types: 1 Glasses of wine per week    Comment: per week  . Drug use: No  . Sexual activity: Yes    Birth control/protection: I.U.D.  Other Topics Concern  . Not on file  Social History Narrative  . Not on file   Social Determinants of Health   Financial Resource Strain:   . Difficulty of Paying Living Expenses:   Food Insecurity:   . Worried About Programme researcher, broadcasting/film/video in the Last Year:   . Barista  in the Last Year:   Transportation Needs:   . Freight forwarder (Medical):   Marland Kitchen Lack of Transportation (Non-Medical):   Physical Activity:   . Days of Exercise per Week:   . Minutes of Exercise per Session:   Stress:   . Feeling of Stress :   Social Connections:   . Frequency of Communication with Friends and Family:   . Frequency of Social Gatherings with Friends and Family:   . Attends Religious Services:   . Active Member of Clubs or Organizations:   . Attends Banker Meetings:   Marland Kitchen Marital Status:   Intimate Partner Violence:   . Fear of Current or Ex-Partner:   . Emotionally Abused:   Marland Kitchen Physically Abused:   . Sexually Abused:  Allergies  Allergen Reactions  . Wellbutrin [Bupropion] Hives     Outpatient Medications Prior to Visit  Medication Sig Dispense Refill  . albuterol (VENTOLIN HFA) 108 (90 Base) MCG/ACT inhaler Inhale 2 puffs into the lungs every 6 (six) hours as needed for wheezing or shortness of breath. 18 g 1  . ALPRAZolam (XANAX) 0.5 MG tablet Take 0.5 mg by mouth at bedtime as needed for anxiety.    . ARIPiprazole (ABILIFY) 5 MG tablet Take 5 mg by mouth every morning.    . cholecalciferol (VITAMIN D) 1000 units tablet Take 1,000 Units by mouth at bedtime.    . diphenhydrAMINE (BENADRYL) 25 MG tablet Take 50 mg by mouth every 6 (six) hours as needed for itching or allergies.    Marland Kitchen escitalopram (LEXAPRO) 10 MG tablet Take 2 tablets (20 mg total) by mouth at bedtime. For depression    . fluticasone (FLONASE) 50 MCG/ACT nasal spray SPRAY 2 SPRAYS INTO EACH NOSTRIL EVERY DAY 16 g 3  . fluticasone (FLOVENT HFA) 110 MCG/ACT inhaler Inhale 2 puffs into the lungs 2 (two) times daily. 1 Inhaler 12  . lamoTRIgine (LAMICTAL) 100 MG tablet Take 200 mg by mouth daily.     Marland Kitchen levonorgestrel (MIRENA) 20 MCG/24HR IUD 1 each by Intrauterine route once.    . lithium carbonate 150 MG capsule Take 150 mg by mouth 3 (three) times daily with meals.    .  montelukast (SINGULAIR) 10 MG tablet TAKE 1 TABLET BY MOUTH EVERYDAY AT BEDTIME 90 tablet 0  . Multiple Vitamin (MULTIVITAMIN) tablet Take 1 tablet by mouth daily.    . pramipexole (MIRAPEX) 0.5 MG tablet     . triamcinolone cream (KENALOG) 0.1 % Apply 1 application topically 2 (two) times daily. (Patient taking differently: Apply 1 application topically 2 (two) times daily. prn) 30 g 2  . cyanocobalamin 1000 MCG tablet Take 1,000 mcg by mouth at bedtime.     . ondansetron (ZOFRAN ODT) 4 MG disintegrating tablet Take 1 tablet (4 mg total) by mouth every 8 (eight) hours as needed for nausea or vomiting. (Patient not taking: Reported on 01/11/2020) 20 tablet 0   No facility-administered medications prior to visit.    Review of Systems  Constitutional: Negative for fatigue and fever.  HENT: Positive for postnasal drip and sneezing. Negative for ear discharge, ear pain, facial swelling, hearing loss, nosebleeds, rhinorrhea, sinus pressure, sinus pain, sore throat, tinnitus and trouble swallowing.   Eyes: Negative for photophobia and itching.  Respiratory: Negative for cough, hemoptysis, chest tightness, shortness of breath and wheezing.   Cardiovascular: Negative for chest pain, dyspnea on exertion, leg swelling and PND.  Gastrointestinal: Negative for abdominal pain and heartburn.       No GERD now  Genitourinary: Negative.   Musculoskeletal: Negative for myalgias.  Skin: Negative for rash.  Neurological: Negative for dizziness, tremors, seizures, syncope, weakness, light-headedness and headaches.  Hematological: Negative for adenopathy. Does not bruise/bleed easily.  Psychiatric/Behavioral: Negative for agitation, confusion, decreased concentration, dysphoric mood, self-injury and suicidal ideas. The patient is not nervous/anxious and is not hyperactive.    C   Objective:   Physical Exam  Vitals:   01/11/20 0933  BP: 134/82  Pulse: 90  Resp: 16  Temp: 98.1 F (36.7 C)  SpO2: 99%    Weight: 272 lb (123.4 kg)  Height: 5\' 4"  (1.626 m)    Gen: Pleasant, obese, in no distress,  normal affect  ENT: No lesions,  mouth clear,  oropharynx clear,  no postnasal drip  Neck: No JVD, no TMG, no carotid bruits  Lungs: No use of accessory muscles, no dullness to percussion, clear without rales or rhonchi  Cardiovascular: RRR, heart sounds normal, no murmur or gallops, no peripheral edema  Abdomen: soft and NT, no HSM,  BS normal  Musculoskeletal: No deformities, no cyanosis or clubbing  Neuro: alert, non focal  Skin: Warm, no lesions or rashes  She notes her peak flow rates have been in the green zone which for her is 400  Depression screen [facility] 2/9 01/11/2020 10/18/2019 09/06/2019 04/06/2019 03/02/2019  Decreased Interest 0 0 1 1 1   Down, Depressed, Hopeless 0 0 1 1 1   PHQ - 2 Score 0 0 2 2 2   Altered sleeping 1 0 0 1 1  Tired, decreased energy 1 1 1 2 2   Change in appetite 0 0 0 1 1  Feeling bad or failure about yourself  0 1 1 1 1   Trouble concentrating 1 1 1 2 1   Moving slowly or fidgety/restless 0 0 0 1 1  Suicidal thoughts 0 0 0 0 0  PHQ-9 Score 3 3 5 10 9   Difficult doing work/chores - Not difficult at all - - Somewhat difficult        Assessment & Plan:  I personally reviewed all images and lab data in the [facility] system as well as any outside material available during this office visit and agree with the  radiology impressions.   Asthma, moderate persistent Moderate persistent asthma stable at this time  Continue Flonase and Flovent as needed albuterol  Bipolar 1 disorder, depressed (HCC) Bipolar disorder with depression stable at this time she will continue to follow with psychiatry and maintain her current medication profile  Class 3 severe obesity due to excess calories without serious comorbidity with body mass index (BMI) of 45.0 to 49.9 in adult [facility]) We discussed a exercise and nutrition plan for her continued weight loss  Mold suspected exposure She  no longer is in a mold environment   Diagnoses and all orders for this visit:  IUD (intrauterine device) in place -     Ambulatory referral to Gynecology  Moderate persistent asthma without complication -     Ambulatory referral to Allergy  Allergic rhinitis caused by mold -     Ambulatory referral to Allergy  Food allergy -     Ambulatory referral to Allergy  Need for hepatitis C screening test -     Hepatitis c antibody (reflex)  Bipolar 1 disorder, depressed (HCC)  Class 3 severe obesity due to excess calories without serious comorbidity with body mass index (BMI) of 45.0 to 49.9 in adult (HCC)  Mold suspected exposure  Other orders -     HCV Comment:  We did check hepatitis C antibody for primary care status and it was negative The patient did receive the Covid vaccine from the [employer] site and it was vaccine she will bring her documentation at the next visit  In addition due to multiple food allergies and allergy evaluation referral was made

## 2020-01-11 NOTE — Patient Instructions (Signed)
No change in medications A hepatitis C level will be obtained A referral to gynecology will be made for your IUD care A referral to allergy will be made for your food allergy issues  Return video visit Dr Delford Field 4 months

## 2020-01-12 DIAGNOSIS — Z91018 Allergy to other foods: Secondary | ICD-10-CM | POA: Insufficient documentation

## 2020-01-12 LAB — HCV COMMENT:

## 2020-01-12 LAB — HEPATITIS C ANTIBODY (REFLEX): HCV Ab: <0.1 s/co ratio (ref 0.0–0.9)

## 2020-01-12 NOTE — Assessment & Plan Note (Signed)
We discussed a exercise and nutrition plan for her continued weight loss

## 2020-01-12 NOTE — Assessment & Plan Note (Signed)
Moderate persistent asthma stable at this time  Continue Flonase and Flovent as needed albuterol

## 2020-01-12 NOTE — Assessment & Plan Note (Signed)
She no longer is in a mold environment

## 2020-01-12 NOTE — Assessment & Plan Note (Signed)
Bipolar disorder with depression stable at this time she will continue to follow with psychiatry and maintain her current medication profile

## 2020-01-12 NOTE — Assessment & Plan Note (Signed)
Multiple food allergies as documented including that of onions

## 2020-01-25 MED FILL — FLOVENT HFA 110 MCG INHALER: 110 | 30 days supply | Qty: 12 | Fill #2

## 2020-01-25 MED FILL — LITHIUM CARBONATE ER 300 MG: 300 | 30 days supply | Qty: 60 | Fill #0

## 2020-01-25 MED FILL — lamoTRIgine 200 MG TABS: 200 | 30 days supply | Qty: 30 | Fill #1

## 2020-01-27 MED FILL — PRAMIPEXOLE 1.5 MG TABLET: 1.5 | 30 days supply | Qty: 30 | Fill #0

## 2020-02-01 ENCOUNTER — Other Ambulatory Visit: Payer: Self-pay | Admitting: Critical Care Medicine

## 2020-02-01 MED ORDER — MONTELUKAST SODIUM 10 MG PO TABS: ORAL_TABLET | ORAL | 0 refills | Status: DC

## 2020-02-01 MED FILL — MONTELUKAST SOD 10 MG TAB: 10 | 30 days supply | Qty: 30 | Fill #0

## 2020-02-01 NOTE — Telephone Encounter (Signed)
Copied from CRM [id number]. Topic: Quick Communication - Rx Refill/Question >> Feb 01, 2020  1:04 PM Jaquita Rector A wrote: Medication: montelukast (SINGULAIR) 10 MG tablet  Has the patient contacted their pharmacy? Yes.   (Agent: If no, request that the patient contact the pharmacy for the refill.) (Agent: If yes, when and what did the pharmacy advise?)  Preferred Pharmacy (with phone number or street name): [facility] - [place], [place] - [address]  Phone:  [phone] Fax:  [phone]     Agent: Please be advised that RX refills may take up to 3 business days. We ask that you follow-up with your pharmacy.

## 2020-02-06 ENCOUNTER — Ambulatory Visit (INDEPENDENT_AMBULATORY_CARE_PROVIDER_SITE_OTHER)
Admission: RE | Admit: 2020-02-06 | Discharge: 2020-02-06 | Disposition: A | Discharge status: Home / Self Care | Payer: Self-pay | Source: Ambulatory Visit

## 2020-02-06 DIAGNOSIS — R202 Paresthesia of skin: Secondary | ICD-10-CM

## 2020-02-06 DIAGNOSIS — G5603 Carpal tunnel syndrome, bilateral upper limbs: Secondary | ICD-10-CM

## 2020-02-06 MED ORDER — PREDNISONE 10 MG (21) PO TBPK: ORAL_TABLET | Freq: Every day | ORAL | 0 refills | Status: AC

## 2020-02-06 NOTE — ED Provider Notes (Signed)
[facility]  Virtual Visit via Video Note:  [name] A Lafave  initiated request for Telemedicine visit with [facility] team. I connected with [name] A Madero  on 02/06/2020 at 2:50 PM  for a synchronized telemedicine visit using a video enabled HIPPA compliant telemedicine application. I verified that I am speaking with [name] A Preston  using two identifiers. Marykay Lex, NP  was physically located in a [facility] Urgent care site and [name] A Walraven was located at a different location.   The limitations of evaluation and management by telemedicine as well as the availability of in-person appointments were discussed. Patient was informed that she  may incur a bill ( including co-pay) for this virtual visit encounter. [name] A Hankerson  expressed understanding and gave verbal consent to proceed with virtual visit.   [id number] 02/06/20 Arrival Time: 1204  CC: Bilateral hand pain  SUBJECTIVE: History from: patient.  [name] [name] is a 34 y.o. female who presents with gradual onset of bilateral wrist and hand pain for the last week.  Reports that she has had this before, and states that she was treated for carpal tunnel and was given wrist braces.  Reports she still has these at home.  Reports that she feels like she cannot grip things as she usually does, reports that there is some tingling in her hands as well.  Denies fever, chills, fatigue, ear pain, sinus pain, rhinorrhea, nasal congestion, cough, SOB, wheezing, chest pain, nausea, rash, changes in bowel or bladder habits.    ROS: As per HPI.  All other pertinent ROS negative.     Past Medical History:  Diagnosis Date   Anxiety    Depression    GERD (gastroesophageal reflux disease)    History of kidney stones    Pneumonitis 03/02/2019   Past Surgical History:  Procedure Laterality Date   CYSTOSCOPY WITH RETROGRADE PYELOGRAM, URETEROSCOPY AND STENT PLACEMENT Right 12/10/2016   Procedure:  CYSTOSCOPY WITH RETROGRADE PYELOGRAM, URETEROSCOPY AND STONE REMOVAL;  Surgeon: Heloise Purpura, MD;  Location: [facility];  Service: Urology;  Laterality: Right;   WISDOM TOOTH EXTRACTION  at age 81   Allergies  Allergen Reactions   Wellbutrin [Bupropion] Hives   No current facility-administered medications on file prior to encounter.   Current Outpatient Medications on File Prior to Encounter  Medication Sig Dispense Refill   albuterol (VENTOLIN HFA) 108 (90 Base) MCG/ACT inhaler Inhale 2 puffs into the lungs every 6 (six) hours as needed for wheezing or shortness of breath. 18 g 1   ALPRAZolam (XANAX) 0.5 MG tablet Take 0.5 mg by mouth at bedtime as needed for anxiety.     ARIPiprazole (ABILIFY) 5 MG tablet Take 5 mg by mouth every morning.     cholecalciferol (VITAMIN D) 1000 units tablet Take 1,000 Units by mouth at bedtime.     diphenhydrAMINE (BENADRYL) 25 MG tablet Take 50 mg by mouth every 6 (six) hours as needed for itching or allergies.     escitalopram (LEXAPRO) 10 MG tablet Take 2 tablets (20 mg total) by mouth at bedtime. For depression     fluticasone (FLONASE) 50 MCG/ACT nasal spray SPRAY 2 SPRAYS INTO EACH NOSTRIL EVERY DAY 16 g 3   fluticasone (FLOVENT HFA) 110 MCG/ACT inhaler Inhale 2 puffs into the lungs 2 (two) times daily. 1 Inhaler 12   lamoTRIgine (LAMICTAL) 100 MG tablet Take 200 mg by mouth daily.      levonorgestrel (MIRENA) 20 MCG/24HR IUD 1 each  by Intrauterine route once.     lithium carbonate 150 MG capsule Take 150 mg by mouth 3 (three) times daily with meals.     montelukast (SINGULAIR) 10 MG tablet TAKE 1 TABLET BY MOUTH EVERYDAY AT BEDTIME 90 tablet 0   Multiple Vitamin (MULTIVITAMIN) tablet Take 1 tablet by mouth daily.     pramipexole (MIRAPEX) 0.5 MG tablet      triamcinolone cream (KENALOG) 0.1 % Apply 1 application topically 2 (two) times daily. (Patient taking differently: Apply 1 application topically 2 (two) times daily. prn) 30 g 2    Social History   Socioeconomic History   Marital status: Single    Spouse name: Not on file   Number of children: Not on file   Years of education: Not on file   Highest education level: Not on file  Occupational History   Not on file  Tobacco Use   Smoking status: Never Smoker   Smokeless tobacco: Never Used  Vaping Use   Vaping Use: Never used  Substance and Sexual Activity   Alcohol use: Yes    Alcohol/week: 1.0 standard drink    Types: 1 Glasses of wine per week    Comment: per week   Drug use: No   Sexual activity: Yes    Birth control/protection: I.U.D.  Other Topics Concern   Not on file  Social History Narrative   Not on file   Social Determinants of Health   Financial Resource Strain:    Difficulty of Paying Living Expenses:   Food Insecurity:    Worried About Programme researcher, broadcasting/film/video in the Last Year:    Barista in the Last Year:   Transportation Needs:    Freight forwarder (Medical):    Lack of Transportation (Non-Medical):   Physical Activity:    Days of Exercise per Week:    Minutes of Exercise per Session:   Stress:    Feeling of Stress :   Social Connections:    Frequency of Communication with Friends and Family:    Frequency of Social Gatherings with Friends and Family:    Attends Religious Services:    Active Member of Clubs or Organizations:    Attends Engineer, structural:    Marital Status:   Intimate Partner Violence:    Fear of Current or Ex-Partner:    Emotionally Abused:    Physically Abused:    Sexually Abused:    Family History  Problem Relation Age of Onset   Depression Mother    Anxiety disorder Mother     OBJECTIVE:   There were no vitals filed for this visit.  General appearance: alert; no distress Eyes: EOMI grossly HENT: normocephalic; atraumatic Neck: supple with FROM Lungs: normal respiratory effort; speaking in full sentences without difficulty Extremities:  moves extremities without difficulty Skin: No obvious rashes Neurologic: No facial asymmetries Psychological: alert and cooperative; normal mood and affect  ASSESSMENT & PLAN:  1. Paresthesia of both hands   2. Bilateral carpal tunnel syndrome     Meds ordered this encounter  Medications   predniSONE (STERAPRED UNI-PAK 21 TAB) 10 MG (21) TBPK tablet    Sig: Take by mouth daily for 6 days. Take 6 tablets on day 1, 5 tablets on day 2, 4 tablets on day 3, 3 tablets on day 4, 2 tablets on day 5, 1 tablet on day 6    Dispense:  21 tablet    Refill:  0  Order Specific Question:   Supervising Provider    Answer:   Merrilee Jansky [[id number]]     Prednisone taper prescribed  Brace wrists at night and with activity Take OTC ibuprofen or tylenol as needed for pain Follow up with PCP if symptoms persist Return or go to ER if you have any new or worsening symptoms such as fever, chills, nausea, vomiting, worsening sore throat, cough, abdominal pain, chest pain, changes in bowel or bladder habits  I discussed the assessment and treatment plan with the patient. The patient was provided an opportunity to ask questions and all were answered. The patient agreed with the plan and demonstrated an understanding of the instructions.   The patient was advised to call back or seek an in-person evaluation if the symptoms worsen or if the condition fails to improve as anticipated.  I provided 12 minutes of non-face-to-face time during this encounter.  Marykay Lex, NP  02/06/2020 2:50 PM         Moshe Cipro, NP 02/06/20 1450

## 2020-02-06 NOTE — Discharge Instructions (Addendum)
I think that you are experiencing a flare up of carpal tunnel  I have sent in a prednisone taper for you to take for 6 days. 6 tablets on day one, 5 tablets on day two, 4 tablets on day three, 3 tablets on day four, 2 tablets on day five, and 1 tablet on day six.  Brace both wrists at night and for comfort  Follow up with this office or with primary care as needed

## 2020-02-07 MED FILL — ?PREDINSONE 10MG TABLETS: 10 | 6 days supply | Qty: 21 | Fill #0

## 2020-02-20 ENCOUNTER — Other Ambulatory Visit: Payer: Self-pay

## 2020-02-20 MED FILL — ESCITALOPRAM 20 MG TABLET: 20 | 90 days supply | Qty: 90 | Fill #0

## 2020-02-20 MED FILL — LITHIUM CARBONATE ER 300 MG: 300 | 30 days supply | Qty: 60 | Fill #0

## 2020-02-20 MED FILL — ARIPiprazole 5 MG TABS: 5 | 90 days supply | Qty: 90 | Fill #0

## 2020-02-20 MED FILL — PRAMIPEXOLE 1.5 MG TABLET: 1.5 | 90 days supply | Qty: 90 | Fill #0

## 2020-02-20 MED FILL — lamoTRIgine 200 MG TABS: 200 | 90 days supply | Qty: 90 | Fill #0

## 2020-02-22 MED FILL — $FLOVENT HFA 110 MCG/ACT: 110 | 90 days supply | Qty: 36 | Fill #3

## 2020-03-01 MED FILL — lamoTRIgine 200 MG TABS: 200 | 90 days supply | Qty: 90 | Fill #0

## 2020-03-05 MED FILL — ESCITALOPRAM 20 MG TABLET: 20 | 90 days supply | Qty: 90 | Fill #0

## 2020-03-05 MED FILL — $FLOVENT HFA 110 MCG/ACT: 110 | 90 days supply | Qty: 36 | Fill #3

## 2020-03-05 MED FILL — MONTELUKAST SOD 10 MG TAB: 10 | 30 days supply | Qty: 30 | Fill #1

## 2020-03-05 MED FILL — PRAMIPEXOLE 1.5 MG TABLET: 1.5 | 90 days supply | Qty: 90 | Fill #0

## 2020-03-05 MED FILL — ARIPiprazole 5 MG TABS: 5 | 90 days supply | Qty: 90 | Fill #0

## 2020-03-05 MED FILL — LITHIUM CARBONATE ER 300 MG: 300 | 30 days supply | Qty: 60 | Fill #0

## 2020-03-06 ENCOUNTER — Ambulatory Visit: Payer: Self-pay | Admitting: Allergy & Immunology

## 2020-03-08 ENCOUNTER — Encounter: Payer: Self-pay | Admitting: Obstetrics and Gynecology

## 2020-03-08 ENCOUNTER — Telehealth: Payer: Self-pay | Admitting: Critical Care Medicine

## 2020-03-08 ENCOUNTER — Other Ambulatory Visit: Payer: Self-pay

## 2020-03-08 NOTE — Telephone Encounter (Signed)
Please advise.  Copied from CRM [id number]. Topic: Referral - Request for Referral >> Mar 08, 2020  8:55 AM Gwenlyn Fudge wrote: Has patient seen PCP for this complaint? Yes.   *If NO, is insurance requiring patient see PCP for this issue before PCP can refer them? Referral for which specialty: Gynecology  Preferred provider/office: N/A Reason for referral: Pt called stating that the OBGYN that she has referral to will not accept orange card. Please advise .

## 2020-03-08 NOTE — Telephone Encounter (Signed)
I lvm to patient let her know that she has [employer]   exp 04/2020 and  the [employer] cover  any  [employer] . Please, call [phone] to schedule an appointment.

## 2020-04-03 MED FILL — MONTELUKAST SOD 10 MG TAB: 10 | 30 days supply | Qty: 30 | Fill #2

## 2020-04-16 ENCOUNTER — Other Ambulatory Visit: Payer: Self-pay

## 2020-04-16 ENCOUNTER — Encounter: Payer: Self-pay | Admitting: Allergy

## 2020-04-16 ENCOUNTER — Ambulatory Visit (INDEPENDENT_AMBULATORY_CARE_PROVIDER_SITE_OTHER): Payer: No Typology Code available for payment source | Admitting: Allergy

## 2020-04-16 VITALS — BP 120/70 | HR 87 | Temp 97.7°F | Resp 16 | Ht 64.0 in | Wt 273.8 lb

## 2020-04-16 DIAGNOSIS — J4540 Moderate persistent asthma, uncomplicated: Secondary | ICD-10-CM

## 2020-04-16 DIAGNOSIS — J3089 Other allergic rhinitis: Secondary | ICD-10-CM

## 2020-04-16 DIAGNOSIS — T781XXD Other adverse food reactions, not elsewhere classified, subsequent encounter: Secondary | ICD-10-CM

## 2020-04-16 DIAGNOSIS — T781XXA Other adverse food reactions, not elsewhere classified, initial encounter: Secondary | ICD-10-CM | POA: Insufficient documentation

## 2020-04-16 DIAGNOSIS — L2089 Other atopic dermatitis: Secondary | ICD-10-CM | POA: Insufficient documentation

## 2020-04-16 MED ORDER — EUCRISA 2 % EX OINT: 100.0000 g | TOPICAL_OINTMENT | Freq: Two times a day (BID) | CUTANEOUS | 5 refills | Status: DC | PRN

## 2020-04-16 MED ORDER — EPINEPHRINE 0.3 MG/0.3ML IJ SOAJ: 0.3000 mg | Freq: Once | INTRAMUSCULAR | 2 refills | Status: AC

## 2020-04-16 NOTE — Assessment & Plan Note (Signed)
Rash behind the ears which comes and goes.  See below for proper skin care.   May use Eucrisa twice a day behind the ears as needed.

## 2020-04-16 NOTE — Assessment & Plan Note (Signed)
Perennial rhinoconjunctivitis symptoms for 10+ years mainly in the spring and fall.  Tried Flonase, Zyrtec and thinks Singulair with some benefit.  No previous allergy/ENT evaluation.  Return for skin testing.   May use over the counter antihistamines such as Zyrtec (cetirizine), Claritin (loratadine), Allegra (fexofenadine), or Xyzal (levocetirizine) daily as needed.

## 2020-04-16 NOTE — Patient Instructions (Addendum)
Unable to do skin testing today.   Food:  Continue to avoid cornbread and figbar.  Avoid fresh fruits.   I have prescribed epinephrine injectable and demonstrated proper use. For mild symptoms you can take over the counter antihistamines such as Benadryl and monitor symptoms closely. If symptoms worsen or if you have severe symptoms including breathing issues, throat closure, significant swelling, whole body hives, severe diarrhea and vomiting, lightheadedness then inject epinephrine and seek immediate medical care afterwards.  Food action plan given.   Environmental allergies  May use over the counter antihistamines such as Zyrtec (cetirizine), Claritin (loratadine), Allegra (fexofenadine), or Xyzal (levocetirizine) daily as needed.  Asthma:  Follow up with your pulmonologist.  Today's breathing test was normal. Daily controller medication(s): Flovent 2 puffs twice a day with spacer and rinse mouth afterwards. Continue with montelukast 10mg  daily.  May use albuterol rescue inhaler 2 puffs every 4 to 6 hours as needed for shortness of breath, chest tightness, coughing, and wheezing. May use albuterol rescue inhaler 2 puffs 5 to 15 minutes prior to strenuous physical activities. Monitor frequency of use.  Asthma control goals:  Full participation in all desired activities (may need albuterol before activity) Albuterol use two times or less a week on average (not counting use with activity) Cough interfering with sleep two times or less a month Oral steroids no more than once a year No hospitalizations  Eczema:  See below for proper skin care.   May use Eucrisa twice a day behind the ears as needed.   Follow up in 2 weeks for allergy testing.   Skin care recommendations  Bath time: . Always use lukewarm water. AVOID very hot or cold water. Keep bathing time to 5-10 minutes. . Do NOT use bubble bath. . Use a mild soap and use just enough to wash the dirty  areas. . Do NOT scrub skin vigorously.  . After bathing, pat dry your skin with a towel. Do NOT rub or scrub the skin.  Moisturizers and prescriptions:  . ALWAYS apply moisturizers immediately after bathing (within 3 minutes). This helps to lock-in moisture. . Use the moisturizer several times a day over the whole body. Marland Kitchen summer moisturizers include: Aveeno, CeraVe, Cetaphil. Peri Jefferson winter moisturizers include: Aquaphor, Vaseline, Cerave, Cetaphil, Eucerin, Vanicream. . When using moisturizers along with medications, the moisturizer should be applied about one hour after applying the medication to prevent diluting effect of the medication or moisturize around where you applied the medications. When not using medications, the moisturizer can be continued twice daily as maintenance.  Laundry and clothing: . Avoid laundry products with added color or perfumes. . Use unscented hypo-allergenic laundry products such as Tide free, Cheer free & gentle, and All free and clear.  . If the skin still seems dry or sensitive, you can try double-rinsing the clothes. . Avoid tight or scratchy clothing such as wool. . Do not use fabric softeners or dyer sheets.

## 2020-04-16 NOTE — Progress Notes (Signed)
New Patient Note  RE: [name] A [place] MRN: [id number] DOB: May 17, 1986 Date of Office Visit: 04/16/2020  Referring provider: Storm Frisk, MD Primary care provider: Storm Frisk, MD  Chief Complaint: Food Intolerance  History of Present Illness: I had the pleasure of seeing [name] [name] for initial evaluation at the [facility] on 04/16/2020. She is a 34 y.o. female, who is referred here by Storm Frisk, MD for the evaluation of asthma, allergic rhinitis and food allergy.  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 The reaction occurred about 1 year ago after she ate a banana smoothie. She tried fresh bananas a few months ago and did not tolerate it.  Symptoms started within minutes and was in the form of mouth pain, lip tingling, throat tightness, voice hoarseness. The symptoms resolved after 1 hour.  Within the past 1 month she noted reactions to kiwi, frozen blueberry, apple, celery, clementine, almonds, cornbread, figbar.  She had similar reactions as above like the bananas.   Cornbread:  Patient ate cornbread one day with no issues and then the following day she had cornbread in the morning and had similar symptoms as above. She couldn't take a benadryl land the symptoms lasted for a few hours. She also had some diarrhea.   Figbar: Yesterday she had rhinorrhea, fatigue, voice hoarseness after eating this particular figbar. She had figbars before with no issues.   Onions cause migraines.   She does not have access to epinephrine autoinjector.   Past work up includes: none.  Dietary History: patient has been eating other foods including limited milk and eggs, peanut, treenuts, sesame, soy, wheat, fruits and vegetables. Patient has been vegan since 2013.   Rhinitis: She reports symptoms of sneezing, watery/itchy eyes, rhinorrhea. Symptoms have been going on for 14 years. The symptoms are present all year around with worsening in spring and  fall. Anosmia: no. Headache: yes. She has used Flonase, zyrtec and Singulair with fair improvement in symptoms. Sinus infections: no. Previous work up includes: none. Previous ENT evaluation: no. Previous sinus imaging: no. History of nasal polyps: no. Last eye exam: 2 years ago. History of reflux: yes which is triggered by her anxiety.  Asthma:  Patient follows with pulmonologist every 4 months.  She reports symptoms of chest tightness, shortness of breath, coughing, wheezing for 1 year. Current medications include Flovent 2 puffs BID and albuterol which help. She reports using aerochamber with inhalers. She tried the following inhalers: none. Main triggers are exercise, mold and dust. In the last month, frequency of symptoms: <1x/week. Frequency of nocturnal symptoms: 0x/month. Frequency of SABA use: <1x/week. Interference with physical activity: sometimes. Sleep is undisturbed. In the last 12 months, emergency room visits/urgent care visits/doctor office visits or hospitalizations due to respiratory issues: one. In the last 12 months, oral steroids courses: no. Lifetime history of hospitalization for respiratory issues: no. Prior intubations: no. Asthma was diagnosed at age 47s. History of pneumonia: possibly last year. She was evaluated by pulmonologist in the past. Smoking exposure: no. Up to date with flu vaccine: yes. Up to date with COVID-19 vaccine: yes.  Patient may have had COVID-19 in March 2019.   Assessment and Plan: [name] is a 34 y.o. female with: Adverse food reaction Patient concerned about food allergies. Noted some mouth pain, lip tingling, throat tightness and voice hoarseness after eating bananas, kiwi, frozen blueberry, apple, celery, clementine, almonds and most recently to cornbread and figbar. Symptoms resolve within 1  hour. No prior evaluation. Onions cause migraines.   Unable to skin test today due to recent reaction and histamine control was borderline  positive.  Continue to avoid cornbread and figbar.  Avoid fresh fruits as above.   I have prescribed epinephrine injectable and demonstrated proper use. For mild symptoms you can take over the counter antihistamines such as Benadryl and monitor symptoms closely. If symptoms worsen or if you have severe symptoms including breathing issues, throat closure, significant swelling, whole body hives, severe diarrhea and vomiting, lightheadedness then inject epinephrine and seek immediate medical care afterwards.  Food action plan given.   Return for skin testing in 2 weeks. If unable to skin test then will order bloodwork.    Other allergic rhinitis Perennial rhinoconjunctivitis symptoms for 10+ years mainly in the spring and fall.  Tried Flonase, Zyrtec and thinks Singulair with some benefit.  No previous allergy/ENT evaluation.  Return for skin testing.   May use over the counter antihistamines such as Zyrtec (cetirizine), Claritin (loratadine), Allegra (fexofenadine), or Xyzal (levocetirizine) daily as needed.  Other atopic dermatitis Rash behind the ears which comes and goes.  See below for proper skin care.   May use Eucrisa twice a day behind the ears as needed.   Asthma, moderate persistent Follows with pulmonology - diagnosed about 1 year ago. Doing well with below regimen.   Today's spirometry was normal.  Follow up with pulmonologist as scheduled.  Daily controller medication(s): Flovent 2 puffs twice a day with spacer and rinse mouth afterwards. Continue with montelukast  daily.  May use albuterol rescue inhaler 2 puffs every 4 to 6 hours as needed for shortness of breath, chest tightness, coughing, and wheezing. May use albuterol rescue inhaler 2 puffs 5 to 15 minutes prior to strenuous physical activities. Monitor frequency of use.   Return in about 2 weeks (around 04/30/2020) for Skin testing.  Meds ordered this encounter  Medications   EPINEPHrine (AUVI-Q)  0.3 mg/0.3 mL IJ SOAJ injection    Sig: Inject 0.3 mg into the muscle once for 1 dose. As directed for life-threatening allergic reactions    Dispense:  1 each    Refill:  2    Please dispense Mylan or Teva brand.   Crisaborole (EUCRISA) 2 % OINT    Sig: Apply 100 g topically 2 (two) times daily as needed.    Dispense:  100 g    Refill:  5   Other allergy screening: Medication allergy: yes Hymenoptera allergy: no Urticaria: no Eczema:yes History of recurrent infections suggestive of immunodeficency: no  Diagnostics: Spirometry:  Tracings reviewed. Her effort: Good reproducible efforts. FVC: 4.15L FEV1: 2.98L, 95% predicted FEV1/FVC ratio: 72% Interpretation: Spirometry consistent with normal pattern.  Please see scanned spirometry results for details.   Past Medical History: Patient Active Problem List   Diagnosis Date Noted   Adverse food reaction 04/16/2020   Other atopic dermatitis 04/16/2020   Food allergy 01/12/2020   Class 3 severe obesity due to excess calories without serious comorbidity with body mass index (BMI) of 45.0 to 49.9 in adult (HCC) 09/06/2019   Eczema of external ear, 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   Asthma, moderate persistent 03/02/2019   Other allergic rhinitis 03/02/2019   Mold suspected exposure 03/02/2019   Bipolar 1 disorder, depressed (HCC) 03/02/2019   MDD (major depressive disorder), recurrent severe, without psychosis (HCC) 01/24/2017   Past Medical History:  Diagnosis Date   Anxiety    Depression    GERD (gastroesophageal reflux disease)  History of kidney stones    Pneumonitis 03/02/2019   Past Surgical History: Past Surgical History:  Procedure Laterality Date   CYSTOSCOPY WITH RETROGRADE PYELOGRAM, URETEROSCOPY AND STENT PLACEMENT Right 12/10/2016   Procedure: CYSTOSCOPY WITH RETROGRADE PYELOGRAM, URETEROSCOPY AND STONE REMOVAL;  Surgeon: Heloise PurpuraBorden, Lester, MD;  Location: [facility];  Service: Urology;  Laterality: Right;    WISDOM TOOTH EXTRACTION  at age 34   Medication List:  Current Outpatient Medications  Medication Sig Dispense Refill   albuterol (VENTOLIN HFA) 108 (90 Base) MCG/ACT inhaler Inhale 2 puffs into the lungs every 6 (six) hours as needed for wheezing or shortness of breath. 18 g 1   ALPRAZolam (XANAX) 0.5 MG tablet Take 0.5 mg by mouth at bedtime as needed for anxiety.     ARIPiprazole (ABILIFY) 5 MG tablet Take 5 mg by mouth every morning.     cholecalciferol (VITAMIN D) 1000 units tablet Take 1,000 Units by mouth at bedtime.     diphenhydrAMINE (BENADRYL) 25 MG tablet Take 50 mg by mouth every 6 (six) hours as needed for itching or allergies.     escitalopram (LEXAPRO) 10 MG tablet Take 2 tablets (20 mg total) by mouth at bedtime. For depression     fluticasone (FLONASE) 50 MCG/ACT nasal spray SPRAY 2 SPRAYS INTO EACH NOSTRIL EVERY DAY 16 g 3   fluticasone (FLOVENT HFA) 110 MCG/ACT inhaler Inhale 2 puffs into the lungs 2 (two) times daily. 1 Inhaler 12   lamoTRIgine (LAMICTAL) 100 MG tablet Take 200 mg by mouth daily.      levonorgestrel (MIRENA) 20 MCG/24HR IUD 1 each by Intrauterine route once.     lithium carbonate 150 MG capsule Take 150 mg by mouth 3 (three) times daily with meals.     montelukast (SINGULAIR) 10 MG tablet TAKE 1 TABLET BY MOUTH EVERYDAY AT BEDTIME 90 tablet 0   Multiple Vitamin (MULTIVITAMIN) tablet Take 1 tablet by mouth daily.     pramipexole (MIRAPEX) 0.5 MG tablet      triamcinolone cream (KENALOG) 0.1 % Apply 1 application topically 2 (two) times daily. (Patient taking differently: Apply 1 application topically 2 (two) times daily. prn) 30 g 2   Crisaborole (EUCRISA) 2 % OINT Apply 100 g topically 2 (two) times daily as needed. 100 g 5   EPINEPHrine (AUVI-Q) 0.3 mg/0.3 mL IJ SOAJ injection Inject 0.3 mg into the muscle once for 1 dose. As directed for life-threatening allergic reactions 1 each 2   No current facility-administered medications for this  visit.   Allergies: Allergies  Allergen Reactions   Wellbutrin [Bupropion] Hives   Social History: Social History   Socioeconomic History   Marital status: Single    Spouse name: Not on file   Number of children: Not on file   Years of education: Not on file   Highest education level: Not on file  Occupational History   Not on file  Tobacco Use   Smoking status: Never Smoker   Smokeless tobacco: Never Used  Vaping Use   Vaping Use: Never used  Substance and Sexual Activity   Alcohol use: Yes    Alcohol/week: 1.0 standard drink    Types: 1 Glasses of wine per week    Comment: per week   Drug use: No   Sexual activity: Yes    Birth control/protection: I.U.D.  Other Topics Concern   Not on file  Social History Narrative   Not on file   Social Determinants of Health   Financial  Resource Strain:    Difficulty of Paying Living Expenses: Not on file  Food Insecurity:    Worried About Running Out of Food in the Last Year: Not on file   Ran Out of Food in the Last Year: Not on file  Transportation Needs:    Lack of Transportation (Medical): Not on file   Lack of Transportation (Non-Medical): Not on file  Physical Activity:    Days of Exercise per Week: Not on file   Minutes of Exercise per Session: Not on file  Stress:    Feeling of Stress : Not on file  Social Connections:    Frequency of Communication with Friends and Family: Not on file   Frequency of Social Gatherings with Friends and Family: Not on file   Attends Religious Services: Not on file   Active Member of Clubs or Organizations: Not on file   Attends Banker Meetings: Not on file   Marital Status: Not on file   Lives in a [place] year old townhome. Smoking: denies Occupation: Chief Strategy Officer HistorySurveyor, minerals in the house: no Engineer, civil (consulting) in the family room: no Carpet in the bedroom: yes Heating: electric Cooling: central Pet: yes 1  dog and 2 cats  Family History: Family History  Problem Relation Age of Onset   Depression Mother    Anxiety disorder Mother    Problem                               Relation Asthma                                   No  Eczema                                No  Food allergy                          No  Allergic rhino conjunctivitis     No   Review of Systems  Constitutional: Negative for appetite change, chills, fever and unexpected weight change.  HENT: Positive for postnasal drip and rhinorrhea. Negative for congestion.   Eyes: Negative for itching.  Respiratory: Negative for cough, chest tightness, shortness of breath and wheezing.   Cardiovascular: Negative for chest pain.  Gastrointestinal: Negative for abdominal pain.  Genitourinary: Negative for difficulty urinating.  Skin: Positive for rash.  Neurological: Negative for headaches.   Objective: BP 120/70    Pulse 87    Temp 97.7 F (36.5 C) (Temporal)    Resp 16    Ht 5\' 4"  (1.626 m)    Wt 273 lb 12.8 oz (124.2 kg)    SpO2 99%    BMI 47.00 kg/m  Body mass index is 47 kg/m. Physical Exam Vitals and nursing note reviewed.  Constitutional:      Appearance: Normal appearance. She is well-developed.  HENT:     Head: Normocephalic and atraumatic.     Right Ear: Tympanic membrane and external ear normal.     Left Ear: Tympanic membrane and external ear normal.     Nose: Rhinorrhea present.     Mouth/Throat:     Mouth: Mucous membranes are moist.     Pharynx: Oropharynx is clear.  Eyes:  Conjunctiva/sclera: Conjunctivae normal.  Cardiovascular:     Rate and Rhythm: Normal rate and regular rhythm.     Heart sounds: Normal heart sounds. No murmur heard.  No friction rub. No gallop.   Pulmonary:     Effort: Pulmonary effort is normal.     Breath sounds: Normal breath sounds. No wheezing, rhonchi or rales.  Abdominal:     Palpations: Abdomen is soft.  Musculoskeletal:     Cervical back: Neck supple.  Skin:     General: Skin is warm.     Findings: Rash present.     Comments: Erythematous patch behind the ears right > left  Neurological:     Mental Status: She is alert and oriented to person, place, and time.  Psychiatric:        Behavior: Behavior normal.    The plan was reviewed with the patient/family, and all questions/concerned were addressed.  It was my pleasure to see [name] today and participate in her care. Please feel free to contact me with any questions or concerns.  Sincerely,  Wyline Mood, DO Allergy & Immunology  [facility] office: [phone] [facility] office: [phone] [place] office: [phone]

## 2020-04-16 NOTE — Assessment & Plan Note (Signed)
Follows with pulmonology - diagnosed about 1 year ago. Doing well with below regimen.   Today's spirometry was normal.  Follow up with pulmonologist as scheduled.  Daily controller medication(s): Flovent 2 puffs twice a day with spacer and rinse mouth afterwards. Continue with montelukast 10mg  daily.  May use albuterol rescue inhaler 2 puffs every 4 to 6 hours as needed for shortness of breath, chest tightness, coughing, and wheezing. May use albuterol rescue inhaler 2 puffs 5 to 15 minutes prior to strenuous physical activities. Monitor frequency of use.

## 2020-04-16 NOTE — Assessment & Plan Note (Signed)
Patient concerned about food allergies. Noted some mouth pain, lip tingling, throat tightness and voice hoarseness after eating bananas, kiwi, frozen blueberry, apple, celery, clementine, almonds and most recently to cornbread and figbar. Symptoms resolve within 1 hour. No prior evaluation. Onions cause migraines.   Unable to skin test today due to recent reaction and histamine control was borderline positive.  Continue to avoid cornbread and figbar.  Avoid fresh fruits as above.   I have prescribed epinephrine injectable and demonstrated proper use. For mild symptoms you can take over the counter antihistamines such as Benadryl and monitor symptoms closely. If symptoms worsen or if you have severe symptoms including breathing issues, throat closure, significant swelling, whole body hives, severe diarrhea and vomiting, lightheadedness then inject epinephrine and seek immediate medical care afterwards.  Food action plan given.   Return for skin testing in 2 weeks. If unable to skin test then will order bloodwork.

## 2020-04-17 ENCOUNTER — Other Ambulatory Visit: Payer: Self-pay

## 2020-04-17 MED ORDER — EPINEPHRINE 0.3 MG/0.3ML IJ SOAJ: 0.3000 mg | Freq: Once | INTRAMUSCULAR | 1 refills | Status: AC

## 2020-04-17 MED FILL — EPINEPHRINE 0.3 MG AUTO-INJ: 0.3 | 7 days supply | Qty: 2 | Fill #0

## 2020-04-23 ENCOUNTER — Other Ambulatory Visit: Payer: Self-pay | Admitting: Critical Care Medicine

## 2020-04-23 MED FILL — LITHIUM CARBONATE ER 300 MG: 300 | 30 days supply | Qty: 60 | Fill #1

## 2020-04-26 ENCOUNTER — Telehealth: Payer: Self-pay | Admitting: Critical Care Medicine

## 2020-04-26 NOTE — Telephone Encounter (Signed)
Pt was call and leave VM to call back since is too soon to reapply for [facility] and [place] program since the programs exp 05/02/20, she need an app tafter that day not before

## 2020-04-27 ENCOUNTER — Ambulatory Visit: Payer: Self-pay

## 2020-04-30 NOTE — Progress Notes (Signed)
Follow Up Note  RE: [name] [name] MRN: [id number] DOB: Nov 23, 1985 Date of Office Visit: 05/01/2020  Referring provider: Storm FriskWright, Patrick E, MD Primary care provider: Storm FriskWright, Patrick E, MD  Chief Complaint: Allergy Testing  History of Present Illness: I had the pleasure of seeing [name] [name] for a follow up visit at the [facility] on 05/01/2020. She is a 34 y.o. female, who is being followed for adverse food reaction, allergic rhinitis, atopic dermatitis, and asthma. Her previous allergy office visit was on 04/16/2020 with Dr. Selena BattenKim. Today is a Skin testing visit.  Here for skin testing today.  Other atopic dermatitis Pam Drownucrisa was not covered due to cost.   Assessment and Plan: [name] is a 34 y.o. female with: Adverse food reaction Past history - Patient concerned about food allergies. Noted some mouth pain, lip tingling, throat tightness and voice hoarseness after eating bananas, kiwi, frozen blueberry, apple, celery, clementine, almonds and most recently to cornbread and figbar. Symptoms resolve within 1 hour. Onions cause migraines.   Today's skin testing showed: Borderline to weed and carrots. Negative to other select foods. Results given.   Continue to avoid cornbread, figbar, bananas.  Avoid fresh fruits - concern for oral allergy syndrome component.  Food allergen skin testing has excellent negative predictive value however there is still a small chance that the allergy exists. Therefore, we will investigate further with serum specific IgE levels.  You may have a component of oral allergy syndrome as well.   For mild symptoms you can take over the counter antihistamines such as Benadryl and monitor symptoms closely. If symptoms worsen or if you have severe symptoms including breathing issues, throat closure, significant swelling, whole body hives, severe diarrhea and vomiting, lightheadedness then inject epinephrine and seek immediate medical care  afterwards.  Food action plan in place.  Get bloodwork as below.   Other allergic rhinitis Past history - Perennial rhinoconjunctivitis symptoms for 10+ years mainly in the spring and fall.  Tried Flonase, Zyrtec and thinks Singulair with some benefit.    Today's skin testing showed: Borderline to weed only. Will double check via bloodwork.   Start environmental control measures as below.  May use over the counter antihistamines such as Zyrtec (cetirizine), Claritin (loratadine), Allegra (fexofenadine), or Xyzal (levocetirizine) daily as needed.  May use Flonase (fluticasone) nasal spray 1 spray per nostril twice a day as needed for nasal congestion.   Other atopic dermatitis Past history - Rash behind the ears which comes and goes. Interim history - Eucrisa too expensive.  Continue proper skin care.   May use desonide ointment twice a day behind the ears as needed.   Asthma, moderate persistent Past history - diagnosed about 1 year ago. 2021 spirometry was normal.  Follow up with pulmonologist as scheduled.  Daily controller medication(s): Flovent 110mcg 2 puffs twice a day with spacer and rinse mouth afterwards. Continue with montelukast 10mg  daily.  May use albuterol rescue inhaler 2 puffs every 4 to 6 hours as needed for shortness of breath, chest tightness, coughing, and wheezing. May use albuterol rescue inhaler 2 puffs 5 to 15 minutes prior to strenuous physical activities. Monitor frequency of use.   Return in about 4 months (around 08/31/2020).  Meds ordered this encounter  Medications  . desonide (DESOWEN) 0.05 % ointment    Sig: Apply 1 application topically 2 (two) times daily as needed. Eczema behind the ears    Dispense:  15 g    Refill:  2    Lab Orders     [id number] Fig     Allergens w/Total IgE Area 2     Corn IgE     Allergen, Banana f92     Allergen, Kiwi Fruit, f84     Allergen, Blueberry, Rf288     Apple IgE     Celery IgE     Orange IgE      [id number] Almond  Diagnostics: Skin Testing: Environmental allergy panel and select foods. Borderline to weed and carrots. Results discussed with patient/family.  Airborne Adult Perc - 05/01/20 0800    Time Antigen Placed 0845    Allergen Manufacturer Waynette Buttery    Location Back    Number of Test 59    1. Control-Buffer 50% Glycerol Negative    2. Control-Histamine 1 mg/ml 2+    3. Albumin saline Negative    4. Bahia Negative    5. French Southern Territories Negative    6. Johnson Negative    7. Kentucky Blue Negative    8. Meadow Fescue Negative    9. Perennial Rye Negative    10. Sweet Vernal Negative    11. Timothy Negative    12. Cocklebur Negative    13. Burweed Marshelder Negative    14. Ragweed, short Negative    15. Ragweed, Giant Negative    16. Plantain,  English Negative    17. Lamb's Quarters Negative    18. Sheep Sorrell Negative    19. Rough Pigweed Negative    20. Marsh Elder, Rough Negative    21. Mugwort, Common Negative    22. Ash mix Negative    23. Birch mix Negative    24. Beech American Negative    25. Box, Elder Negative    26. Cedar, red Negative    27. Cottonwood, Guinea-Bissau Negative    28. Elm mix Negative    29. Hickory Negative    30. Maple mix Negative    31. Oak, Guinea-Bissau mix Negative    32. Pecan Pollen Negative    33. Pine mix Negative    34. Sycamore Eastern Negative    35. Walnut, Black Pollen Negative    36. Alternaria alternata Negative    37. Cladosporium Herbarum Negative    38. Aspergillus mix Negative    39. Penicillium mix Negative    40. Bipolaris sorokiniana (Helminthosporium) Negative    41. Drechslera spicifera (Curvularia) Negative    42. Mucor plumbeus Negative    43. Fusarium moniliforme Negative    44. Aureobasidium pullulans (pullulara) Negative    45. Rhizopus oryzae Negative    46. Botrytis cinera Negative    47. Epicoccum nigrum Negative    48. Phoma betae Negative    49. Candida Albicans Negative    50. Trichophyton mentagrophytes  Negative    51. Mite, D Farinae  5,000 AU/ml Negative    52. Mite, D Pteronyssinus  5,000 AU/ml Negative    53. Cat Hair 10,000 BAU/ml Negative    54.  Dog Epithelia Negative    55. Mixed Feathers Negative    56. Horse Epithelia Negative    57. Cockroach, German Negative    58. Mouse Negative    59. Tobacco Leaf Negative          Intradermal - 05/01/20 0912    Time Antigen Placed 0912    Allergen Manufacturer Waynette Buttery    Location Arm    Number of Test 15    Control Negative    French Southern Territories Negative  Johnson Negative    7 Grass Negative    Ragweed mix Negative    Weed mix --   +/-   Tree mix Negative    Mold 1 Negative    Mold 2 Negative    Mold 3 Negative    Mold 4 Negative    Cat Negative    Dog Negative    Cockroach Negative    Mite mix Negative          Food Adult Perc - 05/01/20 0900     Control-buffer 50% Glycerol Negative    Control-Histamine 1 mg/ml 2+    1. Peanut Negative    2. Soybean Negative    3. Wheat Negative    4. Sesame Negative    5. Milk, cow Negative    6. Egg White, Chicken Negative    7. Casein Negative    8. Shellfish Mix Negative    9. Fish Mix Negative    13. Almond Negative    49. Onion Negative    51. Carrots --   +/-   52. Celery Negative    53. Corn Negative    55. Grape (White seedless) Negative    56. Orange  Negative    57. Banana Negative    58. Apple Negative           Medication List:  Current Outpatient Medications  Medication Sig Dispense Refill  . albuterol (VENTOLIN HFA) 108 (90 Base) MCG/ACT inhaler Inhale 2 puffs into the lungs every 6 (six) hours as needed for wheezing or shortness of breath. 18 g 1  . ALPRAZolam (XANAX) 0.5 MG tablet Take 0.5 mg by mouth at bedtime as needed for anxiety.    . ARIPiprazole (ABILIFY) 5 MG tablet Take 5 mg by mouth every morning.    . cholecalciferol (VITAMIN D) 1000 units tablet Take 1,000 Units by mouth at bedtime.    . diphenhydrAMINE (BENADRYL) 25 MG tablet Take 50 mg by mouth  every 6 (six) hours as needed for itching or allergies.    Marland Kitchen escitalopram (LEXAPRO) 10 MG tablet Take 2 tablets (20 mg total) by mouth at bedtime. For depression    . fluticasone (FLONASE) 50 MCG/ACT nasal spray SPRAY 2 SPRAYS INTO EACH NOSTRIL EVERY DAY 16 g 3  . fluticasone (FLOVENT HFA) 110 MCG/ACT inhaler Inhale 2 puffs into the lungs 2 (two) times daily. 1 Inhaler 12  . lamoTRIgine (LAMICTAL) 100 MG tablet Take 200 mg by mouth daily.     Marland Kitchen levonorgestrel (MIRENA) 20 MCG/24HR IUD 1 each by Intrauterine route once.    . lithium carbonate 150 MG capsule Take 150 mg by mouth 3 (three) times daily with meals.    . montelukast (SINGULAIR) 10 MG tablet TAKE 1 TABLET BY MOUTH EVERYDAY AT BEDTIME 90 tablet 0  . Multiple Vitamin (MULTIVITAMIN) tablet Take 1 tablet by mouth daily.    . pramipexole (MIRAPEX) 0.5 MG tablet     . desonide (DESOWEN) 0.05 % ointment Apply 1 application topically 2 (two) times daily as needed. Eczema behind the ears 15 g 2   No current facility-administered medications for this visit.   Allergies: Allergies  Allergen Reactions  . Wellbutrin [Bupropion] Hives   I reviewed her past medical history, social history, family history, and environmental history and no significant changes have been reported from her previous visit.  Review of Systems  Constitutional: Negative for appetite change, chills, fever and unexpected weight change.  HENT: Positive for postnasal drip and rhinorrhea.  Negative for congestion.   Eyes: Negative for itching.  Respiratory: Negative for cough, chest tightness, shortness of breath and wheezing.   Cardiovascular: Negative for chest pain.  Gastrointestinal: Negative for abdominal pain.  Genitourinary: Negative for difficulty urinating.  Skin: Positive for rash.  Neurological: Negative for headaches.   Objective: BP 104/74   Pulse 88   Temp (!) 97.1 F (36.2 C) (Temporal)   Resp 18   SpO2 99%  There is no height or weight on file to  calculate BMI. Physical Exam Vitals and nursing note reviewed.  Constitutional:      Appearance: Normal appearance. She is well-developed.  HENT:     Head: Normocephalic and atraumatic.     Right Ear: Tympanic membrane and external ear normal.     Left Ear: Tympanic membrane and external ear normal.     Nose: Rhinorrhea present.     Mouth/Throat:     Mouth: Mucous membranes are moist.     Pharynx: Oropharynx is clear.  Eyes:     Conjunctiva/sclera: Conjunctivae normal.  Cardiovascular:     Rate and Rhythm: Normal rate and regular rhythm.     Heart sounds: Normal heart sounds. No murmur heard.  No friction rub. No gallop.   Pulmonary:     Effort: Pulmonary effort is normal.     Breath sounds: Normal breath sounds. No wheezing, rhonchi or rales.  Musculoskeletal:     Cervical back: Neck supple.  Skin:    General: Skin is warm.     Findings: Rash present.     Comments: Erythematous patch behind the ears right > left  Neurological:     Mental Status: She is alert and oriented to person, place, and time.  Psychiatric:        Behavior: Behavior normal.    Previous notes and tests were reviewed. The plan was reviewed with the patient/family, and all questions/concerned were addressed.  It was my pleasure to see [place] today and participate in her care. Please feel free to contact me with any questions or concerns.  Sincerely,  Wyline Mood, DO Allergy & Immunology  [facility] office: [phone] [facility] office: [phone]

## 2020-05-01 ENCOUNTER — Other Ambulatory Visit: Payer: Self-pay

## 2020-05-01 ENCOUNTER — Encounter: Payer: Self-pay | Admitting: Allergy

## 2020-05-01 ENCOUNTER — Ambulatory Visit (INDEPENDENT_AMBULATORY_CARE_PROVIDER_SITE_OTHER): Payer: No Typology Code available for payment source | Admitting: Allergy

## 2020-05-01 VITALS — BP 104/74 | HR 88 | Temp 97.1°F | Resp 18

## 2020-05-01 DIAGNOSIS — T781XXD Other adverse food reactions, not elsewhere classified, subsequent encounter: Secondary | ICD-10-CM

## 2020-05-01 DIAGNOSIS — J4540 Moderate persistent asthma, uncomplicated: Secondary | ICD-10-CM

## 2020-05-01 DIAGNOSIS — J3089 Other allergic rhinitis: Secondary | ICD-10-CM

## 2020-05-01 DIAGNOSIS — L2089 Other atopic dermatitis: Secondary | ICD-10-CM

## 2020-05-01 MED ORDER — DESONIDE 0.05 % EX OINT: 1.0000 "application " | TOPICAL_OINTMENT | Freq: Two times a day (BID) | CUTANEOUS | 2 refills | Status: DC | PRN

## 2020-05-01 MED FILL — DESONIDE 0.05% OINTMENT: 0.05 | 7 days supply | Qty: 15 | Fill #0

## 2020-05-01 MED FILL — MONTELUKAST SOD 10 MG TAB: 10 | 30 days supply | Qty: 30 | Fill #0

## 2020-05-01 NOTE — Assessment & Plan Note (Signed)
Past history - diagnosed about 1 year ago. 2021 spirometry was normal.  Follow up with pulmonologist as scheduled.  Daily controller medication(s): Flovent 2 puffs twice a day with spacer and rinse mouth afterwards. Continue with montelukast 10mg  daily.  May use albuterol rescue inhaler 2 puffs every 4 to 6 hours as needed for shortness of breath, chest tightness, coughing, and wheezing. May use albuterol rescue inhaler 2 puffs 5 to 15 minutes prior to strenuous physical activities. Monitor frequency of use.

## 2020-05-01 NOTE — Assessment & Plan Note (Signed)
Past history - Rash behind the ears which comes and goes. Interim history - Eucrisa too expensive.  Continue proper skin care.   May use desonide ointment twice a day behind the ears as needed.

## 2020-05-01 NOTE — Assessment & Plan Note (Signed)
Past history - Patient concerned about food allergies. Noted some mouth pain, lip tingling, throat tightness and voice hoarseness after eating bananas, kiwi, frozen blueberry, apple, celery, clementine, almonds and most recently to cornbread and figbar. Symptoms resolve within 1 hour. Onions cause migraines.   Today's skin testing showed: Borderline to weed and carrots. Negative to other select foods. Results given.   Continue to avoid cornbread, figbar, bananas.  Avoid fresh fruits - concern for oral allergy syndrome component.  Food allergen skin testing has excellent negative predictive value however there is still a small chance that the allergy exists. Therefore, we will investigate further with serum specific IgE levels.  You may have a component of oral allergy syndrome as well.   For mild symptoms you can take over the counter antihistamines such as Benadryl and monitor symptoms closely. If symptoms worsen or if you have severe symptoms including breathing issues, throat closure, significant swelling, whole body hives, severe diarrhea and vomiting, lightheadedness then inject epinephrine and seek immediate medical care afterwards.  Food action plan in place.  Get bloodwork as below.

## 2020-05-01 NOTE — Assessment & Plan Note (Signed)
Past history - Perennial rhinoconjunctivitis symptoms for 10+ years mainly in the spring and fall.  Tried Flonase, Zyrtec and thinks Singulair with some benefit.    Today's skin testing showed: Borderline to weed only. Will double check via bloodwork.   Start environmental control measures as below.  May use over the counter antihistamines such as Zyrtec (cetirizine), Claritin (loratadine), Allegra (fexofenadine), or Xyzal (levocetirizine) daily as needed.  May use Flonase (fluticasone) nasal spray 1 spray per nostril twice a day as needed for nasal congestion.

## 2020-05-01 NOTE — Patient Instructions (Addendum)
Today's skin testing showed: Borderline to weed and carrots Results given.   Environmental allergies  Start environmental control measures as below.  May use over the counter antihistamines such as Zyrtec (cetirizine), Claritin (loratadine), Allegra (fexofenadine), or Xyzal (levocetirizine) daily as needed.  May use Flonase (fluticasone) nasal spray 1 spray per nostril twice a day as needed for nasal congestion.   Food:  Continue to avoid cornbread, figbar, bananas.  Avoid fresh fruits.  Food allergen skin testing has excellent negative predictive value however there is still a small chance that the allergy exists. Therefore, we will investigate further with serum specific IgE levels.  You may have a component of oral allergy syndrome as well.   For mild symptoms you can take over the counter antihistamines such as Benadryl and monitor symptoms closely. If symptoms worsen or if you have severe symptoms including breathing issues, throat closure, significant swelling, whole body hives, severe diarrhea and vomiting, lightheadedness then inject epinephrine and seek immediate medical care afterwards.  Food action plan in place.  Get bloodwork:  We are ordering labs, so please allow 1-2 weeks for the results to come back. With the newly implemented Cures Act, the labs might be visible to you at the same time that they become visible to me. However, I will not address the results until all of the results are back, so please be patient.  In the meantime, continue recommendations in your patient instructions, including avoidance measures (if applicable), until you hear from me.  Asthma:  Follow up with your pulmonologist. Daily controller medication(s): Flovent 2 puffs twice a day with spacer and rinse mouth afterwards. Continue with montelukast 10mg  daily.  May use albuterol rescue inhaler 2 puffs every 4 to 6 hours as needed for shortness of breath, chest tightness, coughing, and  wheezing. May use albuterol rescue inhaler 2 puffs 5 to 15 minutes prior to strenuous physical activities. Monitor frequency of use.  Asthma control goals:  Full participation in all desired activities (may need albuterol before activity) Albuterol use two times or less a week on average (not counting use with activity) Cough interfering with sleep two times or less a month Oral steroids no more than once a year No hospitalizations  Eczema:  Continue proper skin care.   May use desonide ointment twice a day behind the ears as needed.   Follow up in 4 months or sooner if needed.   Reducing Pollen Exposure  Pollen seasons: trees (spring), grass (summer) and ragweed/weeds (fall).  Keep windows closed in your home and car to lower pollen exposure.   Install air conditioning in the bedroom and throughout the house if possible.   Avoid going out in dry windy days - especially early morning.  Pollen counts are highest between 5 - 10 AM and on dry, hot and windy days.   Save outside activities for late afternoon or after a heavy rain, when pollen levels are lower.   Avoid mowing of grass if you have grass pollen allergy.  Be aware that pollen can also be transported indoors on people and pets.   Dry your clothes in an automatic dryer rather than hanging them outside where they might collect pollen.   Rinse hair and eyes before bedtime.   Skin care recommendations  Bath time:  Always use lukewarm water. AVOID very hot or cold water.  Keep bathing time to 5-10 minutes.  Do NOT use bubble bath.  Use a mild soap and use just enough to wash  the dirty areas.  Do NOT scrub skin vigorously.   After bathing, pat dry your skin with a towel. Do NOT rub or scrub the skin.  Moisturizers and prescriptions:   ALWAYS apply moisturizers immediately after bathing (within 3 minutes). This helps to lock-in moisture.  Use the moisturizer several times a day over the whole body.  Good  summer moisturizers include: Aveeno, CeraVe, Cetaphil.  Good winter moisturizers include: Aquaphor, Vaseline, Cerave, Cetaphil, Eucerin, Vanicream.  When using moisturizers along with medications, the moisturizer should be applied about one hour after applying the medication to prevent diluting effect of the medication or moisturize around where you applied the medications. When not using medications, the moisturizer can be continued twice daily as maintenance.  Laundry and clothing:  Avoid laundry products with added color or perfumes.  Use unscented hypo-allergenic laundry products such as Tide free, Cheer free & gentle, and All free and clear.   If the skin still seems dry or sensitive, you can try double-rinsing the clothes.  Avoid tight or scratchy clothing such as wool.  Do not use fabric softeners or dyer sheets.

## 2020-05-09 ENCOUNTER — Ambulatory Visit: Payer: Self-pay | Attending: Critical Care Medicine

## 2020-05-09 ENCOUNTER — Other Ambulatory Visit: Payer: Self-pay

## 2020-05-14 ENCOUNTER — Other Ambulatory Visit: Payer: Self-pay

## 2020-05-15 ENCOUNTER — Other Ambulatory Visit (HOSPITAL_COMMUNITY): Payer: Self-pay | Admitting: Family Medicine

## 2020-05-15 ENCOUNTER — Ambulatory Visit (HOSPITAL_COMMUNITY)
Admission: EM | Admit: 2020-05-15 | Discharge: 2020-05-15 | Disposition: A | Discharge status: Home / Self Care | Payer: Self-pay | Attending: Family Medicine | Admitting: Family Medicine

## 2020-05-15 ENCOUNTER — Other Ambulatory Visit: Payer: Self-pay

## 2020-05-15 ENCOUNTER — Encounter (HOSPITAL_COMMUNITY): Payer: Self-pay

## 2020-05-15 ENCOUNTER — Telehealth: Payer: Self-pay | Admitting: Critical Care Medicine

## 2020-05-15 DIAGNOSIS — J3089 Other allergic rhinitis: Secondary | ICD-10-CM | POA: Insufficient documentation

## 2020-05-15 DIAGNOSIS — N3090 Cystitis, unspecified without hematuria: Secondary | ICD-10-CM | POA: Insufficient documentation

## 2020-05-15 LAB — POCT URINALYSIS DIPSTICK, ED / UC
Glucose, UA: NEGATIVE mg/dL
Leukocytes,Ua: NEGATIVE
Nitrite: NEGATIVE
Protein, ur: 30 mg/dL — AB
Specific Gravity, Urine: 1.025 (ref 1.005–1.030)
Urobilinogen, UA: 0.2 mg/dL (ref 0.0–1.0)
pH: 5.5 (ref 5.0–8.0)

## 2020-05-15 MED ORDER — CEPHALEXIN 500 MG PO CAPS: 500.0000 mg | ORAL_CAPSULE | Freq: Two times a day (BID) | ORAL | 0 refills | Status: DC

## 2020-05-15 MED ORDER — BUDESONIDE 32 MCG/ACT NA SUSP
1.0000 | Freq: Every day | NASAL | 0 refills | Status: DC
Start: 2020-05-15 — End: 2020-06-06

## 2020-05-15 NOTE — ED Provider Notes (Signed)
[facility]    CSN: [id number] Arrival date & time: 05/15/20  1829      History   Chief Complaint Chief Complaint  Patient presents with  . Urinary Tract Infection  . Nose problem    HPI [name] [name] is a 34 y.o. female.   HPI  Patient has terminal dysuria and hematuria.  She feels like she has cystitis.  No abdominal pain, fever chills, flank pain or kidney involvement Also use Flonase for her allergies.  Has some irritation inside her nose.  Wants to discuss management  Past Medical History:  Diagnosis Date  . Anxiety   . Depression   . GERD (gastroesophageal reflux disease)   . History of kidney stones   . Pneumonitis 03/02/2019    Patient Active Problem List   Diagnosis Date Noted  . Adverse food reaction 04/16/2020  . Other atopic dermatitis 04/16/2020  . Food allergy 01/12/2020  . Class 3 severe obesity due to excess calories without serious comorbidity with body mass index (BMI) of 45.0 to 49.9 in adult (HCC) 09/06/2019  . 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 Asthma, moderate persistent 03/02/2019  . Other allergic rhinitis 03/02/2019  . Mold suspected exposure 03/02/2019  . Bipolar 1 disorder, depressed (HCC) 03/02/2019  . 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 CYSTOSCOPY WITH RETROGRADE PYELOGRAM, URETEROSCOPY AND STENT PLACEMENT Right 12/10/2016   Procedure: CYSTOSCOPY WITH RETROGRADE PYELOGRAM, URETEROSCOPY AND STONE REMOVAL;  Surgeon: Heloise Purpura, MD;  Location: [facility];  Service: Urology;  Laterality: Right;  . WISDOM TOOTH EXTRACTION  at age 67    OB History   No obstetric history on file.      Home Medications    Prior to Admission medications   Medication Sig Start Date End Date Taking? Authorizing Provider  albuterol (VENTOLIN HFA) 108 (90 Base) MCG/ACT inhaler Inhale 2 puffs into the lungs every 6 (six)  hours as needed for wheezing or shortness of breath. 09/06/19   Storm Frisk, MD  ALPRAZolam Prudy Feeler) 0.5 MG tablet Take 0.5 mg by mouth at bedtime as needed for anxiety.    [provider]  ARIPiprazole (ABILIFY) 5 MG tablet Take 5 mg by mouth every morning. 05/23/19   [provider]  budesonide (RHINOCORT AQUA) 32 MCG/ACT nasal spray Place 1 spray into both nostrils daily. 05/15/20   Eustace Moore, MD  cephALEXin (KEFLEX) 500 MG capsule Take 1 capsule (500 mg total) by mouth 2 (two) times daily. 05/15/20   Eustace Moore, MD  cholecalciferol (VITAMIN D) 1000 units tablet Take 1,000 Units by mouth at bedtime.    [provider]  desonide (DESOWEN) 0.05 % ointment Apply 1 application topically 2 (two) times daily as needed. Eczema behind the ears 05/01/20   Ellamae Sia, DO  diphenhydrAMINE (BENADRYL) 25 MG tablet Take 50 mg by mouth every 6 (six) hours as needed for itching or allergies.    [provider]  escitalopram (LEXAPRO) 10 MG tablet Take 2 tablets (20 mg total) by mouth at bedtime. For depression 03/02/19   Storm Frisk, MD  fluticasone (FLOVENT HFA) 110 MCG/ACT inhaler Inhale 2 puffs into the lungs 2 (two) times daily. 09/06/19   Storm Frisk, MD  lamoTRIgine (LAMICTAL) 100 MG tablet Take 200 mg by mouth daily.     [provider]  levonorgestrel (MIRENA) 20 MCG/24HR IUD 1 each  by Intrauterine route once.    [provider]  lithium carbonate 150 MG capsule Take 150 mg by mouth 3 (three) times daily with meals.    [provider]  montelukast (SINGULAIR) 10 MG tablet TAKE 1 TABLET BY MOUTH EVERYDAY AT BEDTIME 04/23/20   Storm Frisk, MD  Multiple Vitamin (MULTIVITAMIN) tablet Take 1 tablet by mouth daily.    [provider]  pramipexole (MIRAPEX) 0.5 MG tablet  07/05/19   [provider]    Family History Family History  Problem Relation Age of Onset  . Depression Mother   . Anxiety  disorder Mother     Social History Social History   Tobacco Use  . Smoking status: Never Smoker  . Smokeless tobacco: Never Used  Vaping Use  . Vaping Use: Never used  Substance Use Topics  . Alcohol use: Yes    Alcohol/week: 1.0 standard drink    Types: 1 Glasses of wine per week    Comment: per week  . Drug use: No     Allergies   Wellbutrin [bupropion]   Review of Systems Review of Systems See HPI  Physical Exam Triage Vital Signs ED Triage Vitals  Enc Vitals Group     BP 05/15/20 1946 119/75     Pulse Rate 05/15/20 1946 90     Resp 05/15/20 1946 16     Temp 05/15/20 1946 97.8 F (36.6 C)     Temp Source 05/15/20 1946 Oral     SpO2 05/15/20 1946 100 %     Weight --      Height --      Head Circumference --      Peak Flow --      Pain Score 05/15/20 1947 5     Pain Loc --      Pain Edu? --      Excl. in GC? --    No data found.  Updated Vital Signs BP 119/75 (BP Location: Right Arm)   Pulse 90   Temp 97.8 F (36.6 C) (Oral)   Resp 16   SpO2 100%      Physical Exam Constitutional:      General: She is not in acute distress.    Appearance: She is well-developed.  HENT:     Head: Normocephalic and atraumatic.     Nose: Nose normal.     Comments: No erosion seen inside nostrils.  Clear rhinorrhea Eyes:     Conjunctiva/sclera: Conjunctivae normal.     Pupils: Pupils are equal, round, and reactive to light.  Cardiovascular:     Rate and Rhythm: Normal rate.  Pulmonary:     Effort: Pulmonary effort is normal. No respiratory distress.  Abdominal:     General: There is no distension.     Palpations: Abdomen is soft.     Tenderness: There is no abdominal tenderness. There is no right CVA tenderness or left CVA tenderness.  Musculoskeletal:        General: Normal range of motion.     Cervical back: Normal range of motion.  Skin:    General: Skin is warm and dry.  Neurological:     Mental Status: She is alert.  Psychiatric:        Mood and  Affect: Mood normal.        Behavior: Behavior normal.      UC Treatments / Results  Labs (all labs ordered are listed, but only abnormal results are displayed) Labs Reviewed  POCT URINALYSIS DIPSTICK, ED / UC - Abnormal; Notable for the following components:      Result Value   Bilirubin Urine SMALL (*)    Ketones, ur TRACE (*)    Hgb urine dipstick MODERATE (*)    Protein, ur 30 (*)    All other components within normal limits  URINE CULTURE    EKG   Radiology No results found.  Procedures Procedures (including critical care time)  Medications Ordered in UC Medications - No data to display  Initial Impression / Assessment and Plan / UC Course  I have reviewed the triage vital signs and the nursing notes.  Pertinent labs & imaging results that were available during my care of the patient were reviewed by me and considered in my medical decision making (see chart for details).     We will replace her Flonase with an aqueous solution.  Reviewed management of nose irritation with saline and Vaseline Final Clinical Impressions(s) / UC Diagnoses   Final diagnoses:  Cystitis  Non-seasonal allergic rhinitis, unspecified trigger     Discharge Instructions     Drinm more water Take the antibiotic 2 times a day You can check your urine culture report on MyChart  I have sent a prescription to your pharmacy for an aqueous solution of nasal steroid Use this instead of Flonase Try saline spray inside your nose for moisture Try Vaseline inside your nose for protection   ED Prescriptions    Medication Sig Dispense Auth. Provider   budesonide (RHINOCORT AQUA) 32 MCG/ACT nasal spray Place 1 spray into both nostrils daily. 8.6 g Eustace Moore, MD   cephALEXin (KEFLEX) 500 MG capsule Take 1 capsule (500 mg total) by mouth 2 (two) times daily. 10 capsule Eustace Moore, MD     PDMP not reviewed this encounter.   Eustace Moore, MD 05/15/20 2213

## 2020-05-15 NOTE — Discharge Instructions (Addendum)
Drinm more water Take the antibiotic 2 times a day You can check your urine culture report on MyChart  I have sent a prescription to your pharmacy for an aqueous solution of nasal steroid Use this instead of Flonase Try saline spray inside your nose for moisture Try Vaseline inside your nose for protection

## 2020-05-15 NOTE — ED Triage Notes (Signed)
Pt present burning while urinating with blood in her urinary. Symptom started 3 days ago. She also complain nose feeling raw in the inside.

## 2020-05-16 MED FILL — CEPHALEXIN 500 MG CAPSULE: 500 | 5 days supply | Qty: 10 | Fill #0

## 2020-05-17 LAB — URINE CULTURE: Culture: >=100000 — AB

## 2020-05-18 NOTE — Progress Notes (Signed)
Lactobacillus is not a pathogen, normal vaginal flora.  Does not need treatment.  Can stop antibiotics if feels well.

## 2020-05-28 MED FILL — $FLOVENT HFA 110 MCG/ACT: 110 | 90 days supply | Qty: 36 | Fill #4

## 2020-05-28 MED FILL — ESCITALOPRAM 20 MG TABLET: 20 | 90 days supply | Qty: 90 | Fill #0

## 2020-05-28 MED FILL — PRAMIPEXOLE 1.5 MG TABLET: 1.5 | 30 days supply | Qty: 30 | Fill #0

## 2020-05-28 MED FILL — ?ARIPRAZOLE 5 MG TABS: 5 | 30 days supply | Qty: 45 | Fill #0

## 2020-05-28 MED FILL — lamoTRIgine 200 MG TABS: 200 | 30 days supply | Qty: 30 | Fill #0

## 2020-05-28 MED FILL — LITHIUM CARBONATE ER 300 MG: 300 | 30 days supply | Qty: 60 | Fill #2

## 2020-06-04 MED FILL — MONTELUKAST SOD 10 MG TAB: 10 | 30 days supply | Qty: 30 | Fill #1

## 2020-06-06 ENCOUNTER — Ambulatory Visit: Payer: Self-pay | Attending: Critical Care Medicine | Admitting: Critical Care Medicine

## 2020-06-06 ENCOUNTER — Other Ambulatory Visit: Payer: Self-pay

## 2020-06-06 ENCOUNTER — Encounter: Payer: Self-pay | Admitting: Critical Care Medicine

## 2020-06-06 DIAGNOSIS — J4540 Moderate persistent asthma, uncomplicated: Secondary | ICD-10-CM

## 2020-06-06 DIAGNOSIS — Z6841 Body Mass Index (BMI) 40.0 and over, adult: Secondary | ICD-10-CM

## 2020-06-06 DIAGNOSIS — L2089 Other atopic dermatitis: Secondary | ICD-10-CM

## 2020-06-06 DIAGNOSIS — F332 Major depressive disorder, recurrent severe without psychotic features: Secondary | ICD-10-CM

## 2020-06-06 DIAGNOSIS — T781XXD Other adverse food reactions, not elsewhere classified, subsequent encounter: Secondary | ICD-10-CM

## 2020-06-06 DIAGNOSIS — Z975 Presence of (intrauterine) contraceptive device: Secondary | ICD-10-CM

## 2020-06-06 DIAGNOSIS — F319 Bipolar disorder, unspecified: Secondary | ICD-10-CM

## 2020-06-06 MED ORDER — ALBUTEROL SULFATE HFA 108 (90 BASE) MCG/ACT IN AERS: 2.0000 | INHALATION_SPRAY | Freq: Four times a day (QID) | RESPIRATORY_TRACT | 1 refills | Status: DC | PRN

## 2020-06-06 MED ORDER — BUDESONIDE 32 MCG/ACT NA SUSP
1.0000 | Freq: Every day | NASAL | 0 refills | Status: DC
Start: 2020-06-06 — End: 2021-06-12

## 2020-06-06 MED ORDER — FLOVENT HFA 110 MCG/ACT IN AERO
2.0000 | INHALATION_SPRAY | Freq: Two times a day (BID) | RESPIRATORY_TRACT | 11 refills | Status: DC
Start: 2020-10-01 — End: 2021-06-12
  Filled 2021-03-03: qty 12, 30d supply, fill #0
  Filled 2021-04-22 – 2021-05-01 (×2): qty 36, 90d supply, fill #1

## 2020-06-06 MED FILL — ALBUTEROL SULFATE HFA 108 (: 108 (90 BAS | 25 days supply | Qty: 9 | Fill #0

## 2020-06-06 NOTE — Progress Notes (Signed)
Subjective:    Patient ID: [name] [name], female    DOB: 15-Mar-1986, 34 y.o.   MRN: [id number] Virtual Visit via Video Note  I connected with@ on 06/07/20 at@ by a video enabled telemedicine application and verified that I am speaking with the correct person using two identifiers.   Consent:  I discussed the limitations, risks, security and privacy concerns of performing an evaluation and management service by video visit and the availability of in person appointments. I also discussed with the patient that there may be a patient responsible charge related to this service. The patient expressed understanding and agreed to proceed.  Location of patient: Patient is at home  Location of provider: I am in office  Persons participating in the televisit with the patient.   No one else on the call  History of Present Illness: 03/02/19 This is a 34 year old female referred for evaluation of asthma from urgent care.  The patient history dates back to early March of this year when she developed a febrile illness which sounded very much like COVID.  At that time we were not doing community testing and we do not know whether she had positive COVID or not.  She had sore throat cough nasal congestion shortness of breath chest and back pain muscle aches associated wheezing.  Chest x-ray did show pneumonitis when she was in the emergency room in March.  She was treated with antibiotics and symptomatic support.  Follow-up x-rays at urgent care 2 additional visit showed bibasilar groundglass opacities she was given additional courses of antibiotics to include doxycycline she was given benzonatate sertraline and rest and fluids.  The patient continues to have symptoms nevertheless.  She does not have a prior history of asthma.  She does note wheezing fatigue shortness of breath dizziness.  She is had no fever.  Note with her last urgent care visit in June her COVID test was negative.  The patient has nasal  congestion weakness in the limbs as well.  Note she lives in an apartment with leaking water faucets and mold in her bathroom and a leak in the roof.  The landlord has not been willing to make changes in the apartment to help with this condition.  She notes daily sinus pressure and sinus congestion and posterior and anterior nasal drainage.  Last chest x-ray still showed a process in the right and left lower lobes. The patient does have a history of bipolar and is anxious to some degree.  She has smoke exposure in the home.  The patient herself is not a smoker.  She was working at [employer] but has been on leave since March of this year.   The patient's psychiatrist is Dr. Karel Jarvis  04/06/2019 Since the last office visit the patient's dyspnea is improved.  She is coughing less.  She is working with legal aid to see if she can get out of her lease and move out of the apartment that currently is infested with mold.  The patient declines a flu vaccine at this visit   07/27/2019 Since the last visit in September the patient is continuing to improve.  This is a telephone visit She is having less cough only occasional shortness of breath.  She has had headache which is more pressure-like in nature.  She is needing refills on her Singulair.  She finally moved out of the mold in for stated home and is in a clean environment at this time. She maintains Flovent twice  daily 2 puffs and has an AeroChamber for this.  09/06/2019 Since last visit the patient's asthma is in improved control.  She maintains Flovent and Flonase and as needed albuterol along with the Singulair.  She has not had any exacerbations of her asthma since the last visit.  She is moved out of the apartment that was causing significant mold exposure and this is improved her overall situation The patient does have a follow-up with her mental health provider  She does complain of itching in both ears and has a history of eczema on the  external ear and behind the ears as well as in the right groin  3/16: F/u of severe persistent asthma and PCP f/u   The patient notes despite some mild weight loss she is still having dyspnea particular with exertion on the treadmill.  The denies any cough but does note some wheezing.  Note she just recently received her first Covid vaccine injection See asthma assessment below  01/11/2020 Patient seen today in return follow-up and is doing well from the perspective of asthma.  She maintains Flovent inhaler and as needed albuterol.  She follows her peak flow meter and this has been very helpful in determining her stability.  She maintains Singulair daily.  She is stable with regards to her mental health conditions and follows with psychiatry.  Note the patient does not have discovered food allergies including onions and wishes an allergy evaluation  06/06/2020 Visit follow-up primary care visit for this 34 year old female history of severe persistent asthma with presumed allergic factors including mold exposure and then associated history of eczema of the external ear bilaterally major depression bipolar disorder.  Patient also has an IUD in place and wishes to have this removed.  The patient has since been seen by allergy had an extensive evaluation including skin testing for environmental and food allergens.  She only showed positivity to weeds and carrots.  She states she has difficulty with fruits nuts and other types of food.  She states most of it is when she is putting the food directly in the mouth she has of immediate reaction orally.  Oral allergy syndrome was given considered by the allergist however the work-up has not been revealing.  The patient was recommended to stay on antihistamine histamine and nasal spray with steroid.  She went to urgent care when she had difficulty in her nasal passage with Flonase she was switched to Rhinocort this is cause less side effects for the patient.   Patient does have continued obesity and knows exercise and nutrition is a must.  She states her breathing is somewhat at baseline.  She does have a dry mouth and heartburn type symptoms.  She still has eye watering and postnasal drainage.  She notes increased sneezing and occasional wheezing.  She was also treated for a urinary tract infection she states those symptoms have resolved.  She is very much interested in getting the IUD out.   Asthma There is no cough, hemoptysis, shortness of breath or wheezing. Primary symptoms comments: Uses rescue inhaler and ? If helps. The problem has been rapidly improving. Associated symptoms include postnasal drip and sneezing. Pertinent negatives include no chest pain, dyspnea on exertion, ear pain, fever, headaches, heartburn, myalgias, nasal congestion, orthopnea, PND, rhinorrhea, sore throat or trouble swallowing. Her symptoms are aggravated by exercise, change in weather, exposure to fumes, pollen, exposure to smoke, strenuous activity and eating (smoke). Her past medical history is significant for asthma and pneumonia.  Past Medical History:  Diagnosis Date  . Anxiety   . Depression   . GERD (gastroesophageal reflux disease)   . History of kidney stones   . Pneumonitis 03/02/2019     Family History  Problem Relation Age of Onset  . Depression Mother   . Anxiety disorder Mother      Social History   Socioeconomic History  . Marital status: Single    Spouse name: Not on file  . Number of children: Not on file  . Years of education: Not on file  . Highest education level: Not on file  Occupational History  . Not on file  Tobacco Use  . Smoking status: Never Smoker  . Smokeless tobacco: Never Used  Vaping Use  . Vaping Use: Never used  Substance and Sexual Activity  . Alcohol use: Yes    Alcohol/week: 1.0 standard drink    Types: 1 Glasses of wine per week    Comment: per week  . Drug use: No  . Sexual activity: Yes    Birth  control/protection: I.U.D.  Other Topics Concern  . Not on file  Social History Narrative  . Not on file   Social Determinants of Health   Financial Resource Strain:   . Difficulty of Paying Living Expenses: Not on file  Food Insecurity:   . Worried About Programme researcher, broadcasting/film/video in the Last Year: Not on file  . Ran Out of Food in the Last Year: Not on file  Transportation Needs:   . Lack of Transportation (Medical): Not on file  . Lack of Transportation (Non-Medical): Not on file  Physical Activity:   . Days of Exercise per Week: Not on file  . Minutes of Exercise per Session: Not on file  Stress:   . Feeling of Stress : Not on file  Social Connections:   . Frequency of Communication with Friends and Family: Not on file  . Frequency of Social Gatherings with Friends and Family: Not on file  . Attends Religious Services: Not on file  . Active Member of Clubs or Organizations: Not on file  . Attends Banker Meetings: Not on file  . Marital Status: Not on file  Intimate Partner Violence:   . Fear of Current or Ex-Partner: Not on file  . Emotionally Abused: Not on file  . Physically Abused: Not on file  . Sexually Abused: Not on file     Allergies  Allergen Reactions  . Wellbutrin [Bupropion] Hives     Outpatient Medications Prior to Visit  Medication Sig Dispense Refill  . ALPRAZolam (XANAX) 0.5 MG tablet Take 0.5 mg by mouth at bedtime as needed for anxiety.    . ARIPiprazole (ABILIFY) 5 MG tablet Take 5 mg by mouth every morning.    . cholecalciferol (VITAMIN D) 1000 units tablet Take 1,000 Units by mouth at bedtime.    Marland Kitchen desonide (DESOWEN) 0.05 % ointment Apply 1 application topically 2 (two) times daily as needed. Eczema behind the ears 15 g 2  . diphenhydrAMINE (BENADRYL) 25 MG tablet Take 50 mg by mouth every 6 (six) hours as needed for itching or allergies.    Marland Kitchen escitalopram (LEXAPRO) 20 MG tablet Take 20 mg by mouth every morning.    . lamoTRIgine  (LAMICTAL) 200 MG tablet Take 200 mg by mouth at bedtime.    Marland Kitchen levonorgestrel (MIRENA) 20 MCG/24HR IUD 1 each by Intrauterine route once.    . lithium carbonate (LITHOBID) 300 MG CR tablet  Take 600 mg by mouth at bedtime.    . montelukast (SINGULAIR) 10 MG tablet TAKE 1 TABLET BY MOUTH EVERYDAY AT BEDTIME 90 tablet 0  . Multiple Vitamin (MULTIVITAMIN) tablet Take 1 tablet by mouth daily.    . pramipexole (MIRAPEX) 1.5 MG tablet Take 1.5 mg by mouth at bedtime.    Marland Kitchen albuterol (VENTOLIN HFA) 108 (90 Base) MCG/ACT inhaler Inhale 2 puffs into the lungs every 6 (six) hours as needed for wheezing or shortness of breath. 18 g 1  . budesonide (RHINOCORT AQUA) 32 MCG/ACT nasal spray Place 1 spray into both nostrils daily. 8.6 g 0  . fluticasone (FLOVENT HFA) 110 MCG/ACT inhaler Inhale 2 puffs into the lungs 2 (two) times daily. 1 Inhaler 12  . cephALEXin (KEFLEX) 500 MG capsule Take 1 capsule (500 mg total) by mouth 2 (two) times daily. 10 capsule 0  . escitalopram (LEXAPRO) 10 MG tablet Take 2 tablets (20 mg total) by mouth at bedtime. For depression    . lamoTRIgine (LAMICTAL) 100 MG tablet Take 200 mg by mouth daily.     Marland Kitchen lithium carbonate 150 MG capsule Take 150 mg by mouth 3 (three) times daily with meals.    . pramipexole (MIRAPEX) 0.5 MG tablet      No facility-administered medications prior to visit.    Review of Systems  Constitutional: Negative for fatigue and fever.  HENT: Positive for postnasal drip and sneezing. Negative for ear discharge, ear pain, facial swelling, hearing loss, nosebleeds, rhinorrhea, sinus pressure, sinus pain, sore throat, tinnitus and trouble swallowing.   Eyes: Negative for photophobia and itching.  Respiratory: Negative for cough, hemoptysis, chest tightness, shortness of breath and wheezing.   Cardiovascular: Negative for chest pain, dyspnea on exertion, leg swelling and PND.  Gastrointestinal: Negative for abdominal pain and heartburn.       No GERD now   Genitourinary: Negative.   Musculoskeletal: Negative for myalgias.  Skin: Negative for rash.  Neurological: Negative for dizziness, tremors, seizures, syncope, weakness, light-headedness and headaches.  Hematological: Negative for adenopathy. Does not bruise/bleed easily.  Psychiatric/Behavioral: Negative for agitation, confusion, decreased concentration, dysphoric mood, self-injury and suicidal ideas. The patient is not nervous/anxious and is not hyperactive.    C   Objective:   Physical Exam  There were no vitals filed for this visit.  This is a video visit on the video the patient's tongue was examined and appeared to be normal the backs of her ears were examined and there is still eczema seen behind both ears  Depression screen [facility] 2/9 06/06/2020 01/11/2020 10/18/2019 09/06/2019 04/06/2019  Decreased Interest 1 0 0 1 1  Down, Depressed, Hopeless 0 0 0 1 1  PHQ - 2 Score 1 0 0 2 2  Altered sleeping 1 1 0 0 1  Tired, decreased energy Change in appetite 1 0 0 0 1  Feeling bad or failure about yourself  2 0 Trouble concentrating Moving slowly or fidgety/restless 0 0 0 0 1  Suicidal thoughts 0 0 0 0 0  PHQ-9 Score Difficult doing work/chores Somewhat difficult - Not difficult at all - -        Assessment & Plan:  I personally reviewed all images and lab data in the [facility] system as well as any outside material available during this office visit and agree with the  radiology impressions.  Asthma, moderate persistent Moderate persistent asthma currently stable at this time will continue current inhaled Flovent and as needed albuterol and monitoring peak flow rates also use antihistamine and nasal spray with Rhinocort and increase the Rhinocort to 2 sprays each nostril daily  Continue Singulair as well  Other atopic dermatitis Atopic dermatitis continue topical steroid  Adverse food reaction Extensive allergy testing was done with skin testing  was negative allergy recommended blood test patient does not wish to proceed due to expense recommended continued following of certain food restrictions per allergies recommendations for now  Bipolar 1 disorder, depressed (HCC) Bipolar disorder stable at this time she is encouraged to follow-up with her mental health provider  MDD (major depressive disorder), recurrent severe, without psychosis (HCC) As per bipolar assessment  Class 3 severe obesity due to excess calories without serious comorbidity with body mass index (BMI) of 45.0 to 49.9 in adult [facility]) We discussed dietary modifications and the need for increased movement of her body  IUD (intrauterine device) in place Patient wishes to have the IUD removed an appointment be made with gynecology    [name] was seen today for asthma.  Diagnoses and all orders for this visit:  IUD (intrauterine device) in place -     Ambulatory referral to Gynecology  Moderate persistent asthma without complication  Other atopic dermatitis  Adverse food reaction, subsequent encounter  Bipolar 1 disorder, depressed (HCC)  MDD (major depressive disorder), recurrent severe, without psychosis (HCC)  Class 3 severe obesity due to excess calories without serious comorbidity with body mass index (BMI) of 45.0 to 49.9 in adult [facility])  Other orders -     budesonide (RHINOCORT AQUA) 32 MCG/ACT nasal spray; Place 1 spray into both nostrils daily. -     fluticasone (FLOVENT HFA) 110 MCG/ACT inhaler; Inhale 2 puffs into the lungs 2 (two) times daily. -     albuterol (VENTOLIN HFA) 108 (90 Base) MCG/ACT inhaler; Inhale 2 puffs into the lungs every 6 (six) hours as needed for wheezing or shortness of breath.    PHQ-9 score was elevated today please encourage follow-up with her podiatrist Patient knows she is high risk and she should receive a Pfizer booster shot soon   Follow Up Instructions: Patient knows to follow-up in the next month for direct  physical exam  Also an appointment to have the IUD removed will be made   I discussed the assessment and treatment plan with the patient. The patient was provided an opportunity to ask questions and all were answered. The patient agreed with the plan and demonstrated an understanding of the instructions.   The patient was advised to call back or seek an in-person evaluation if the symptoms worsen or if the condition fails to improve as anticipated.  I provided 30 minutes of non-face-to-face time during this encounter  including  median intraservice time , review of notes, labs, imaging, medications  and explaining diagnosis and management to the patient .    Shan Levans, MD

## 2020-06-07 ENCOUNTER — Encounter: Payer: Self-pay | Admitting: Critical Care Medicine

## 2020-06-07 DIAGNOSIS — Z975 Presence of (intrauterine) contraceptive device: Secondary | ICD-10-CM | POA: Insufficient documentation

## 2020-06-07 NOTE — Assessment & Plan Note (Signed)
We discussed dietary modifications and the need for increased movement of her body

## 2020-06-07 NOTE — Patient Instructions (Signed)
No changes in your medications for now stay on your inhalers Rhinocort antihistamines and Singulair  Follow the food avoidance recommendations of allergy for now  An appointment with gynecology be made to have the IUD removed  Follow a healthy diet as outlined below  An appointment to see Dr. Delford Field in 1 month will be made for direct physical exam  Your PHQ-9 depression screen is somewhat elevated please discuss this with your psychiatrist at your next visit   Obesity, Adult Obesity is having too much body fat. Being obese means that your weight is more than what is healthy for you. BMI is a number that explains how much body fat you have. If you have a BMI of 30 or more, you are obese. Obesity is often caused by eating or drinking more calories than your body uses. Changing your lifestyle can help you lose weight. Obesity can cause serious health problems, such as:  Stroke.  Coronary artery disease (CAD).  Type 2 diabetes.  Some types of cancer, including cancers of the colon, breast, uterus, and gallbladder.  Osteoarthritis.  High blood pressure (hypertension).  High cholesterol.  Sleep apnea.  Gallbladder stones.  Infertility problems. What are the causes?  Eating meals each day that are high in calories, sugar, and fat.  Being born with genes that may make you more likely to become obese.  Having a medical condition that causes obesity.  Taking certain medicines.  Sitting a lot (having a sedentary lifestyle).  Not getting enough sleep.  Drinking a lot of drinks that have sugar in them. What increases the risk?  Having a family history of obesity.  Being an [place] American woman.  Being a Hispanic man.  Living in an area with limited access to: ? [place] [facility], recreation centers, or sidewalks. ? Healthy food choices, such as grocery stores and farmers' markets. What are the signs or symptoms? The main sign is having too much body fat. How is this  treated?  Treatment for this condition often includes changing your lifestyle. Treatment may include: ? Changing your diet. This may include making a healthy meal plan. ? Exercise. This may include activity that causes your heart to beat faster (aerobic exercise) and strength training. Work with your doctor to design a program that works for you. ? Medicine to help you lose weight. This may be used if you are not able to lose 1 pound a week after 6 weeks of healthy eating and more exercise. ? Treating conditions that cause the obesity. ? Surgery. Options may include gastric banding and gastric bypass. This may be done if:  Other treatments have not helped to improve your condition.  You have a BMI of 40 or higher.  You have life-threatening health problems related to obesity. Follow these instructions at home: Eating and drinking   Follow advice from your doctor about what to eat and drink. Your doctor may tell you to: ? Limit fast food, sweets, and processed snack foods. ? Choose low-fat options. For example, choose low-fat milk instead of whole milk. ? Eat 5 or more servings of fruits or vegetables each day. ? Eat at home more often. This gives you more control over what you eat. ? Choose healthy foods when you eat out. ? Learn to read food labels. This will help you learn how much food is in 1 serving. ? Keep low-fat snacks available. ? Avoid drinks that have a lot of sugar in them. These include soda, fruit juice, iced tea with  sugar, and flavored milk.  Drink enough water to keep your pee (urine) pale yellow.  Do not go on fad diets. Physical activity  Exercise often, as told by your doctor. Most adults should get up to 150 minutes of moderate-intensity exercise every week.Ask your doctor: ? What types of exercise are safe for you. ? How often you should exercise.  Warm up and stretch before being active.  Do slow stretching after being active (cool down).  Rest between  times of being active. Lifestyle  Work with your doctor and a food expert (dietitian) to set a weight-loss goal that is best for you.  Limit your screen time.  Find ways to reward yourself that do not involve food.  Do not drink alcohol if: ? Your doctor tells you not to drink. ? You are pregnant, may be pregnant, or are planning to become pregnant.  If you drink alcohol: ? Limit how much you use to:  0-1 drink a day for women.  0-2 drinks a day for men. ? Be aware of how much alcohol is in your drink. In the [place] one drink equals one 12 oz bottle of beer (355 mL), one 5 oz glass of wine (148 mL), or one 1 oz glass of hard liquor (44 mL). General instructions  Keep a weight-loss journal. This can help you keep track of: ? The food that you eat. ? How much exercise you get.  Take over-the-counter and prescription medicines only as told by your doctor.  Take vitamins and supplements only as told by your doctor.  Think about joining a support group.  Keep all follow-up visits as told by your doctor. This is important. Contact a doctor if:  You cannot meet your weight loss goal after you have changed your diet and lifestyle for 6 weeks. Get help right away if you:  Are having trouble breathing.  Are having thoughts of harming yourself. Summary  Obesity is having too much body fat.  Being obese means that your weight is more than what is healthy for you.  Work with your doctor to set a weight-loss goal.  Get regular exercise as told by your doctor. This information is not intended to replace advice given to you by your health care provider. Make sure you discuss any questions you have with your health care provider. Document Revised: 03/25/2018 Document Reviewed: 03/25/2018 Elsevier Patient Education  2020 [employer].

## 2020-06-07 NOTE — Assessment & Plan Note (Signed)
Bipolar disorder stable at this time she is encouraged to follow-up with her mental health provider

## 2020-06-07 NOTE — Assessment & Plan Note (Addendum)
Moderate persistent asthma currently stable at this time will continue current inhaled Flovent and as needed albuterol and monitoring peak flow rates also use antihistamine and nasal spray with Rhinocort and increase the Rhinocort to 2 sprays each nostril daily  Continue Singulair as well

## 2020-06-07 NOTE — Assessment & Plan Note (Signed)
Extensive allergy testing was done with skin testing was negative allergy recommended blood test patient does not wish to proceed due to expense recommended continued following of certain food restrictions per allergies recommendations for now

## 2020-06-07 NOTE — Assessment & Plan Note (Signed)
As per bipolar assessment 

## 2020-06-07 NOTE — Assessment & Plan Note (Signed)
Atopic dermatitis continue topical steroid

## 2020-06-07 NOTE — Assessment & Plan Note (Addendum)
Patient wishes to have the IUD removed an appointment be made with gynecology

## 2020-06-11 MED FILL — ?ARIPIPRAZOLE 10MG TABLETS: 10 | 30 days supply | Qty: 30 | Fill #0

## 2020-06-12 ENCOUNTER — Telehealth: Payer: Self-pay

## 2020-06-12 NOTE — Telephone Encounter (Signed)
Patient returned phone call from the office to reschedule upcoming appt with the provider. Patient requested new appointment details be LVM during work hours.

## 2020-06-13 ENCOUNTER — Ambulatory Visit: Payer: Self-pay | Admitting: Internal Medicine

## 2020-06-14 ENCOUNTER — Ambulatory Visit: Payer: Self-pay | Admitting: Internal Medicine

## 2020-06-25 MED FILL — ?ARIPIPRAZOLE 10MG TABLETS: 10 | 30 days supply | Qty: 30 | Fill #0

## 2020-06-29 ENCOUNTER — Ambulatory Visit (HOSPITAL_COMMUNITY)
Admission: EM | Admit: 2020-06-29 | Discharge: 2020-06-29 | Disposition: A | Discharge status: Home / Self Care | Payer: Self-pay | Attending: Family Medicine | Admitting: Family Medicine

## 2020-06-29 ENCOUNTER — Ambulatory Visit (INDEPENDENT_AMBULATORY_CARE_PROVIDER_SITE_OTHER): Payer: Self-pay

## 2020-06-29 ENCOUNTER — Encounter (HOSPITAL_COMMUNITY): Payer: Self-pay | Admitting: *Deleted

## 2020-06-29 DIAGNOSIS — Z3202 Encounter for pregnancy test, result negative: Secondary | ICD-10-CM

## 2020-06-29 DIAGNOSIS — R109 Unspecified abdominal pain: Secondary | ICD-10-CM

## 2020-06-29 HISTORY — DX: Disorder of kidney and ureter, unspecified: N28.9

## 2020-06-29 LAB — POCT URINALYSIS DIPSTICK, ED / UC
Bilirubin Urine: NEGATIVE
Glucose, UA: NEGATIVE mg/dL
Hgb urine dipstick: NEGATIVE
Ketones, ur: NEGATIVE mg/dL
Leukocytes,Ua: NEGATIVE
Nitrite: NEGATIVE
Protein, ur: NEGATIVE mg/dL
Specific Gravity, Urine: 1.02 (ref 1.005–1.030)
Urobilinogen, UA: 0.2 mg/dL (ref 0.0–1.0)
pH: 6.5 (ref 5.0–8.0)

## 2020-06-29 LAB — POC URINE PREG, ED: Preg Test, Ur: NEGATIVE

## 2020-06-29 IMAGING — DX DG ABDOMEN 1V
2 series · 2 of 2 positions shown · non-contrast
Comparison: [DATE] CT renal stone. [DATE] abdominal
radiographs.

CLINICAL DATA: right flank pain x 1 week, evaluating for stone and
stool burden

EXAM:
ABDOMEN - 1 VIEW

[abdomen kub (1 of 2)]
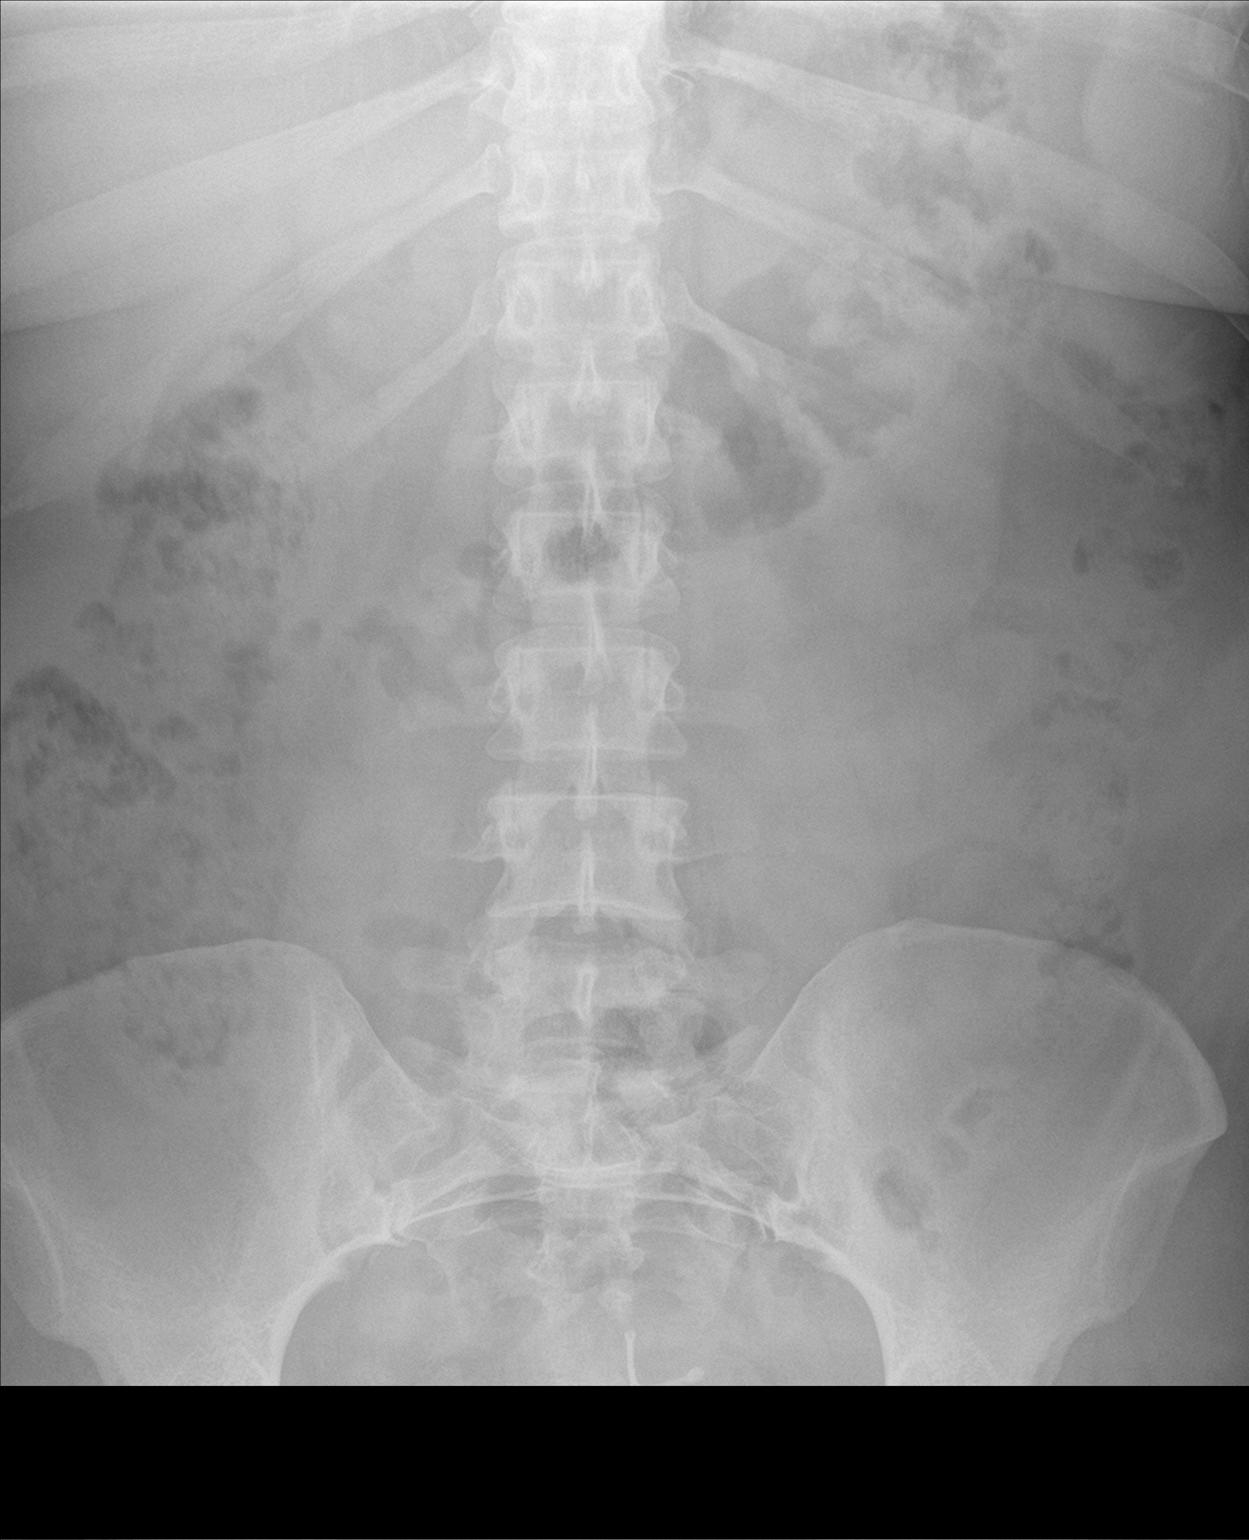

[abdomen kub (2 of 2)]
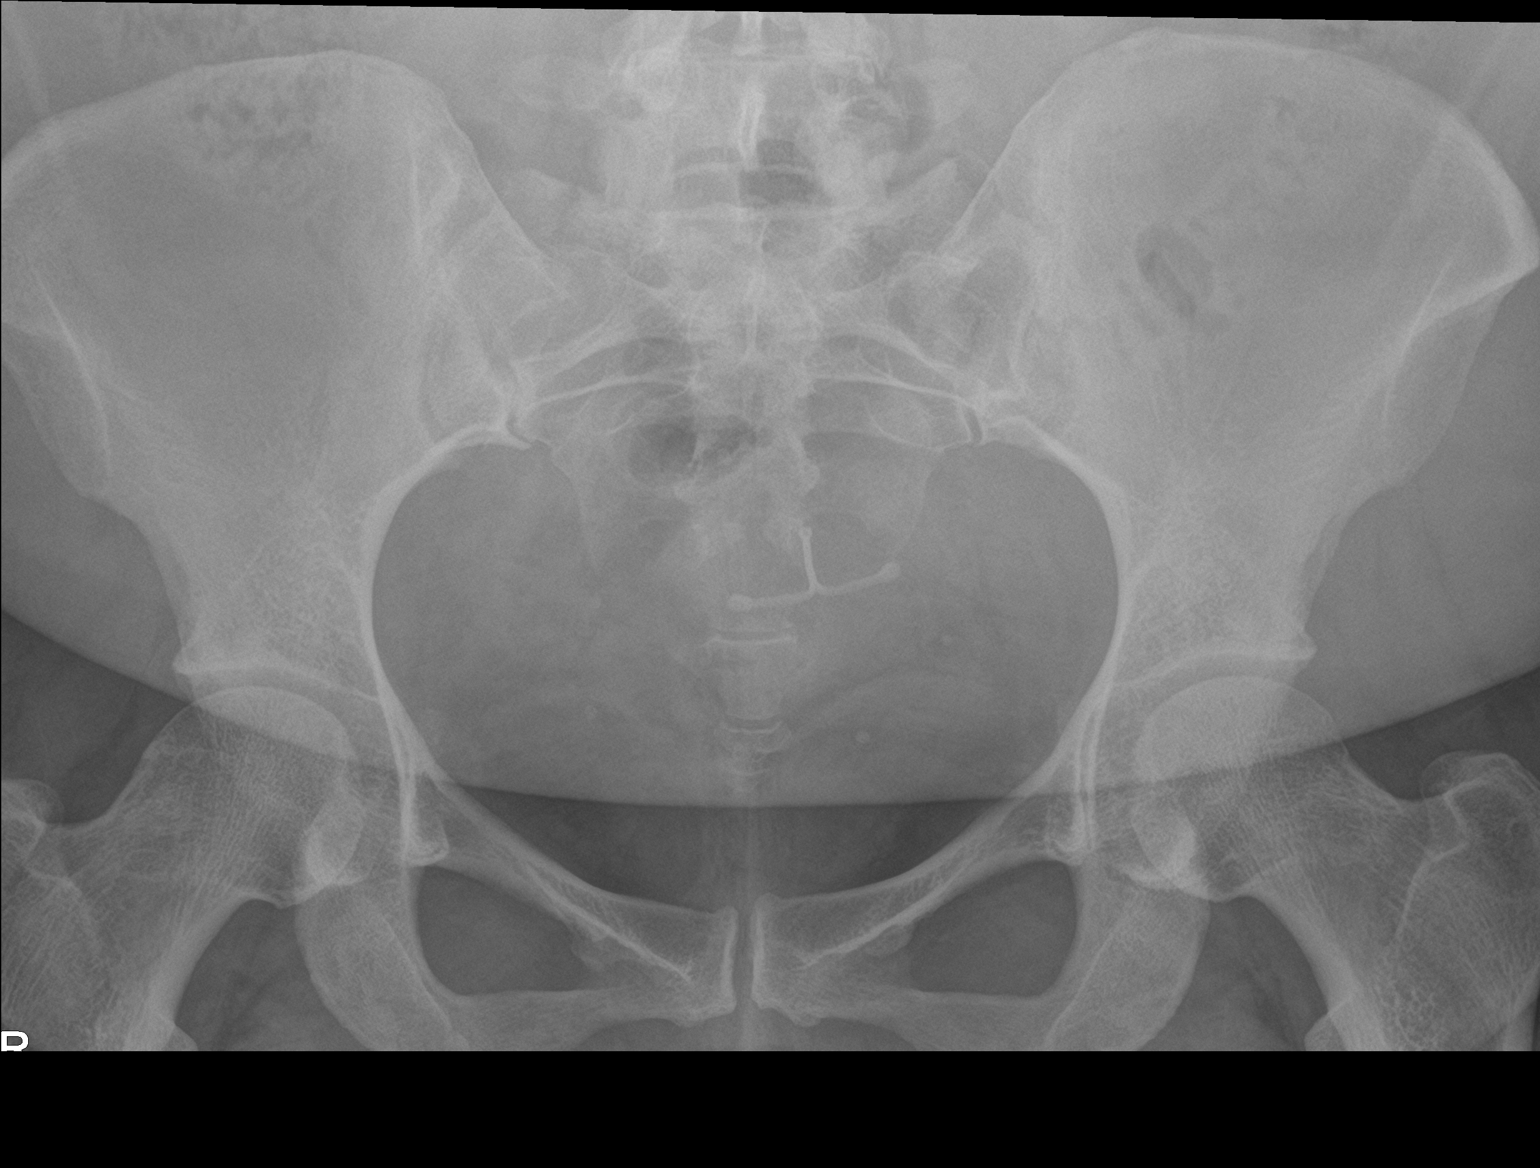

[2 of 2 positions shown; findings below may reference images not displayed]

FINDINGS: The bowel gas pattern is normal. Mild stool burden. No radio-opaque
calculi or other significant radiographic abnormality are seen.
T-shaped contraceptive device overlies the midline pelvis. Pelvic
phleboliths.
IMPRESSION: No radiographic evidence of renal or ureteral calculi.

Mild stool burden.

## 2020-06-29 MED ORDER — ONDANSETRON 4 MG PO TBDP: 4.0000 mg | ORAL_TABLET | Freq: Three times a day (TID) | ORAL | 0 refills | Status: DC | PRN

## 2020-06-29 MED ORDER — NAPROXEN 500 MG PO TABS: 500.0000 mg | ORAL_TABLET | Freq: Two times a day (BID) | ORAL | 0 refills | Status: DC

## 2020-06-29 MED ORDER — TAMSULOSIN HCL 0.4 MG PO CAPS: 0.4000 mg | ORAL_CAPSULE | Freq: Every day | ORAL | 0 refills | Status: DC

## 2020-06-29 NOTE — ED Provider Notes (Addendum)
[facility]    CSN: [id number] Arrival date & time: 06/29/20  0915      History   Chief Complaint Chief Complaint  Patient presents with  . Flank Pain  . Back Pain    HPI [name] [name] is a 34 y.o. female presenting today for evaluation of back pain. Reports dull ache in right lower back. Waxes and wanes, improves with leaning forward. Has felt urgency to defecate. Taken gas-x and hasn't relieved or improved pain. Reports 1 prior stone, requiring removal in 2018.  Normal oral intake, no nausea, or vomiting. Having bowel movement, having looser bowel movement a couple bowel movements a day. Tried Ibuprofen with some relief.    HPI  Past Medical History:  Diagnosis Date  . Anxiety   . Depression   . GERD (gastroesophageal reflux disease)   . History of kidney stones   . Pneumonitis 03/02/2019  . Renal disorder     Patient Active Problem List   Diagnosis Date Noted  . IUD (intrauterine device) in place 06/07/2020  . Adverse food reaction 04/16/2020  . Other atopic dermatitis 04/16/2020  . Food allergy 01/12/2020  . Class 3 severe obesity due to excess calories without serious comorbidity with body mass index (BMI) of 45.0 to 49.9 in adult (HCC) 09/06/2019  . 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 Asthma, moderate persistent 03/02/2019  . Other allergic rhinitis 03/02/2019  . Mold suspected exposure 03/02/2019  . Bipolar 1 disorder, depressed (HCC) 03/02/2019  . 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 CYSTOSCOPY WITH RETROGRADE PYELOGRAM, URETEROSCOPY AND STENT PLACEMENT Right 12/10/2016   Procedure: CYSTOSCOPY WITH RETROGRADE PYELOGRAM, URETEROSCOPY AND STONE REMOVAL;  Surgeon: Heloise Purpura, MD;  Location: [facility];  Service: Urology;  Laterality: Right;  . WISDOM TOOTH EXTRACTION  at age 53    OB History   No obstetric history on file.       Home Medications    Prior to Admission medications   Medication Sig Start Date End Date Taking? Authorizing Provider  ALPRAZolam Prudy Feeler) 0.5 MG tablet Take 0.5 mg by mouth at bedtime as needed for anxiety.   Yes [provider]  ARIPiprazole (ABILIFY) 5 MG tablet Take 5 mg by mouth every morning. 05/23/19  Yes [provider]  budesonide (RHINOCORT AQUA) 32 MCG/ACT nasal spray Place 1 spray into both nostrils daily. 06/06/20  Yes Storm Frisk, MD  cholecalciferol (VITAMIN D) 1000 units tablet Take 1,000 Units by mouth at bedtime.   Yes [provider]  desonide (DESOWEN) 0.05 % ointment Apply 1 application topically 2 (two) times daily as needed. Eczema behind the ears 05/01/20  Yes Ellamae Sia, DO  diphenhydrAMINE (BENADRYL) 25 MG tablet Take 50 mg by mouth every 6 (six) hours as needed for itching or allergies.   Yes [provider]  escitalopram (LEXAPRO) 20 MG tablet Take 20 mg by mouth every morning. 05/28/20  Yes [provider]  fluticasone (FLOVENT HFA) 110 MCG/ACT inhaler Inhale 2 puffs into the lungs 2 (two) times daily. 06/06/20  Yes Storm Frisk, MD  lamoTRIgine (LAMICTAL) 200 MG tablet Take 200 mg by mouth at bedtime. 05/28/20  Yes [provider]  levonorgestrel (MIRENA) 20 MCG/24HR IUD 1 each by Intrauterine route once.   Yes [provider]  montelukast (SINGULAIR) 10 MG tablet TAKE 1 TABLET BY MOUTH EVERYDAY AT BEDTIME 04/23/20  Yes  Storm Frisk, MD  Multiple Vitamin (MULTIVITAMIN) tablet Take 1 tablet by mouth daily.   Yes [provider]  pramipexole (MIRAPEX) 1.5 MG tablet Take 1.5 mg by mouth at bedtime. 05/28/20  Yes [provider]  albuterol (VENTOLIN HFA) 108 (90 Base) MCG/ACT inhaler Inhale 2 puffs into the lungs every 6 (six) hours as needed for wheezing or shortness of breath. 06/06/20   Storm Frisk, MD  lithium carbonate (LITHOBID) 300 MG CR tablet Take 600 mg by  mouth at bedtime. 05/28/20   [provider]  naproxen (NAPROSYN) 500 MG tablet Take 1 tablet (500 mg total) by mouth 2 (two) times daily. 06/29/20   Derrick Tiegs C, PA-C  ondansetron (ZOFRAN ODT) 4 MG disintegrating tablet Take 1 tablet (4 mg total) by mouth every 8 (eight) hours as needed for nausea or vomiting. 06/29/20   Shawan Tosh C, PA-C  tamsulosin (FLOMAX) 0.4 MG CAPS capsule Take 1 capsule (0.4 mg total) by mouth daily. 06/29/20   Rosemary Mossbarger, Junius Creamer, PA-C    Family History Family History  Problem Relation Age of Onset  . Depression Mother   . Anxiety disorder Mother     Social History Social History   Tobacco Use  . Smoking status: Never Smoker  . Smokeless tobacco: Never Used  Vaping Use  . Vaping Use: Never used  Substance Use Topics  . Alcohol use: Yes    Alcohol/week: 1.0 standard drink    Types: 1 Glasses of wine per week    Comment: per week  . Drug use: No     Allergies   Other and Wellbutrin [bupropion]   Review of Systems Review of Systems  Constitutional: Negative for fever.  Respiratory: Negative for shortness of breath.   Cardiovascular: Negative for chest pain.  Gastrointestinal: Negative for abdominal pain, diarrhea, nausea and vomiting.  Genitourinary: Positive for flank pain. Negative for dysuria, genital sores, hematuria, menstrual problem, vaginal bleeding, vaginal discharge and vaginal pain.  Musculoskeletal: Positive for back pain and myalgias.  Skin: Negative for rash.  Neurological: Negative for dizziness, light-headedness and headaches.     Physical Exam Triage Vital Signs ED Triage Vitals  Enc Vitals Group     BP 06/29/20 0937 117/64     Pulse Rate 06/29/20 0937 80     Resp 06/29/20 0937 16     Temp 06/29/20 0937 97.9 F (36.6 C)     Temp Source 06/29/20 0937 Oral     SpO2 06/29/20 0937 100 %     Weight --      Height --      Head Circumference --      Peak Flow --      Pain Score 06/29/20 0936 4     Pain  Loc --      Pain Edu? --      Excl. in GC? --    No data found.  Updated Vital Signs BP 117/64   Pulse 80   Temp 97.9 F (36.6 C) (Oral)   Resp 16   SpO2 100%   Visual Acuity Right Eye Distance:   Left Eye Distance:   Bilateral Distance:    Right Eye Near:   Left Eye Near:    Bilateral Near:     Physical Exam Vitals and nursing note reviewed.  Constitutional:      Appearance: She is well-developed.     Comments: No acute distress  HENT:     Head: Normocephalic and atraumatic.  Nose: Nose normal.  Eyes:     Conjunctiva/sclera: Conjunctivae normal.  Cardiovascular:     Rate and Rhythm: Normal rate.  Pulmonary:     Effort: Pulmonary effort is normal. No respiratory distress.  Abdominal:     General: There is no distension.     Comments: Soft, nondistended, non tender to light and deep palpation throughout abdomen  Musculoskeletal:        General: Normal range of motion.     Cervical back: Neck supple.     Comments: Nontender to palpation of lumbar spine midline, mild reproducible tenderness to palpation of right mid lumbar paraspinal musculature  Ambulates with ease and changes position without assistance  Skin:    General: Skin is warm and dry.  Neurological:     Mental Status: She is alert and oriented to person, place, and time.      UC Treatments / Results  Labs (all labs ordered are listed, but only abnormal results are displayed) Labs Reviewed  POCT URINALYSIS DIPSTICK, ED / UC  POC URINE PREG, ED    EKG   Radiology DG Abdomen 1 View  Result Date: 06/29/2020 CLINICAL DATA:  right flank pain x 1 week, evaluating for stone and stool burden EXAM: ABDOMEN - 1 VIEW COMPARISON:  11/17/2016 CT renal stone. 12/10/2016 abdominal radiographs. FINDINGS: The bowel gas pattern is normal. Mild stool burden. No radio-opaque calculi or other significant radiographic abnormality are seen. T-shaped contraceptive device overlies the midline pelvis. Pelvic  phleboliths. IMPRESSION: No radiographic evidence of renal or ureteral calculi. Mild stool burden. Electronically Signed   By: Stana Buntinghikanele  Emekauwa M.D.   On: 06/29/2020 10:39    Procedures Procedures (including critical care time)  Medications Ordered in UC Medications - No data to display  Initial Impression / Assessment and Plan / UC Course  I have reviewed the triage vital signs and the nursing notes.  Pertinent labs & imaging results that were available during my care of the patient were reviewed by me and considered in my medical decision making (see chart for details).     Pregnancy test negative, UA unremarkable with negative hemoglobin, no signs of infection.  Symptoms do feel similar to prior stone, imaging without signs of radiopaque stone, still feel this is possible etiology given similar to past versus other MSK cause.  Recommending anti-inflammatories and symptomatic and supportive care.  Encourage outpatient follow-up with PCP/urology for further imaging to definitively identify OR rule out stone.  Patient to emergency room if symptoms progressing or worsening.  Discussed strict return precautions. Patient verbalized understanding and is agreeable with plan.  Final Clinical Impressions(s) / UC Diagnoses   Final diagnoses:  Right flank pain     Discharge Instructions     Urine normal, xray normal Continue with Naprosyn twice daily for back pain May take tamsulosin/Flomax daily to further help any underlying stone pass Zofran for nausea Follow-up with primary care/urology outpatient for further imaging If symptoms progressing or worsening please follow-up in the emergency room    ED Prescriptions    Medication Sig Dispense Auth. Provider   naproxen (NAPROSYN) 500 MG tablet Take 1 tablet (500 mg total) by mouth 2 (two) times daily. 30 tablet Jemarcus Dougal C, PA-C   tamsulosin (FLOMAX) 0.4 MG CAPS capsule Take 1 capsule (0.4 mg total) by mouth daily. 30 capsule  Prudie Guthridge C, PA-C   ondansetron (ZOFRAN ODT) 4 MG disintegrating tablet Take 1 tablet (4 mg total) by mouth every 8 (eight) hours as  needed for nausea or vomiting. 20 tablet Garvis Downum, [place] C, PA-C     PDMP not reviewed this encounter.   Lew Dawes, PA-C 06/29/20 1132    Lew Dawes, PA-C 06/29/20 1132

## 2020-06-29 NOTE — Discharge Instructions (Signed)
Urine normal, xray normal Continue with Naprosyn twice daily for back pain May take tamsulosin/Flomax daily to further help any underlying stone pass Zofran for nausea Follow-up with primary care/urology outpatient for further imaging If symptoms progressing or worsening please follow-up in the emergency room

## 2020-06-29 NOTE — ED Triage Notes (Signed)
C/O constant right flank pain radiating across low back x approx 1 wk.  States pain worse with standing for longer periods of time.  Has also been having looser stools than usual, but denies diarrhea.  States pain feels like when she's had a kidney stone in past.

## 2020-06-30 NOTE — Telephone Encounter (Signed)
This pt needs to be worked in either Monday or Tuesday for poss kidney stone

## 2020-07-02 MED FILL — lamoTRIgine 200 MG TABS: 200 | 30 days supply | Qty: 30 | Fill #1

## 2020-07-02 MED FILL — MONTELUKAST SOD 10 MG TAB: 10 | 30 days supply | Qty: 30 | Fill #2

## 2020-07-03 ENCOUNTER — Other Ambulatory Visit: Payer: Self-pay

## 2020-07-03 ENCOUNTER — Encounter: Payer: Self-pay | Admitting: Internal Medicine

## 2020-07-03 ENCOUNTER — Other Ambulatory Visit: Payer: Self-pay | Admitting: Internal Medicine

## 2020-07-03 ENCOUNTER — Ambulatory Visit (INDEPENDENT_AMBULATORY_CARE_PROVIDER_SITE_OTHER): Payer: Self-pay | Admitting: Internal Medicine

## 2020-07-03 VITALS — BP 128/83 | HR 90 | Temp 97.3°F | Resp 17 | Wt 260.0 lb

## 2020-07-03 DIAGNOSIS — Z30432 Encounter for removal of intrauterine contraceptive device: Secondary | ICD-10-CM

## 2020-07-03 DIAGNOSIS — Z30011 Encounter for initial prescription of contraceptive pills: Secondary | ICD-10-CM

## 2020-07-03 MED ORDER — NORETHINDRONE 0.35 MG PO TABS: 1.0000 | ORAL_TABLET | Freq: Every day | ORAL | 11 refills | Status: DC

## 2020-07-03 MED FILL — NORETHINDRONE 0.35 MG TABS: 0.35 | 28 days supply | Qty: 28 | Fill #0

## 2020-07-03 NOTE — Progress Notes (Signed)
°  Subjective:    [name] [name] - 35 y.o. female MRN [id number]  Date of birth: 06/24/1986  HPI  [name] [name] is here for IUD removal and contraception management. Has had Mirena in place for about 6 years. Interested in another IUD; however, is self pay. Prefers to avoid medications that could impact her mental health.      Health Maintenance:  Health Maintenance Due  Topic Date Due   INFLUENZA VACCINE  03/04/2020   PAP SMEAR-Modifier  08/22/2020    -  reports that she has never smoked. She has never used smokeless tobacco. - Review of Systems: Per HPI. - Past Medical History: Patient Active Problem List   Diagnosis Date Noted   IUD (intrauterine device) in place 06/07/2020   Adverse food reaction 04/16/2020   Other atopic dermatitis 04/16/2020   Food allergy 01/12/2020   Class 3 severe obesity due to excess calories without serious comorbidity with body mass index (BMI) of 45.0 to 49.9 in adult [facility]) 09/06/2019   Eczema of external ear, 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   Asthma, moderate persistent 03/02/2019   Other allergic rhinitis 03/02/2019   Mold suspected exposure 03/02/2019   Bipolar 1 disorder, depressed (HCC) 03/02/2019   MDD (major depressive disorder), recurrent severe, without psychosis (HCC) 01/24/2017   - Medications: reviewed and updated   Objective:   Physical Exam BP 128/83    Pulse 90    Temp (!) 97.3 F (36.3 C) (Temporal)    Resp 17    Wt 260 lb (117.9 kg)    SpO2 98%    BMI 44.63 kg/m   GEN: WNWD. NAD.  GU/GYN: Exam performed in the presence of a chaperone. External genitalia within normal limits.  Vaginal mucosa pink, moist, normal rugae.  Nonfriable cervix without lesions, no discharge or bleeding noted on speculum exam.  IUD strings visualized.    IUD Removal  Patient was in the dorsal lithotomy position, normal external genitalia was noted.  A speculum was placed in the patient's vagina, normal discharge was noted, no lesions. The  multiparous cervix was visualized, no lesions, no abnormal discharge.  The strings of the IUD were grasped and pulled using ring forceps. Patient tolerated the procedure well.           Assessment & Plan:   1. Encounter for IUD removal IUD removed without complication.   2. Encounter for initial prescription of contraceptive pills Patient interested in IUD placement but is self pay. Will look into options for reduced cost. In interim, will start POPs for contraception. Discussed importance of taking at same time daily to avoid pregnancy and irregular bleeding.  - norethindrone (ORTHO MICRONOR) 0.35 MG tablet; Take 1 tablet (0.35 mg total) by mouth daily.  Dispense: 28 tablet; Refill: 11     Marcy Siren, D.O. 07/03/2020, 2:42 PM Primary Care at [facility]

## 2020-07-23 ENCOUNTER — Other Ambulatory Visit: Payer: Self-pay | Admitting: Critical Care Medicine

## 2020-07-23 MED FILL — MONTELUKAST SOD 10 MG TAB: 10 | 30 days supply | Qty: 30 | Fill #0

## 2020-07-23 MED FILL — PRAMIPEXOLE 1.5 MG TABLET: 1.5 | 30 days supply | Qty: 30 | Fill #1

## 2020-07-24 ENCOUNTER — Other Ambulatory Visit: Payer: Self-pay

## 2020-07-24 ENCOUNTER — Encounter: Payer: Self-pay | Admitting: Critical Care Medicine

## 2020-07-24 ENCOUNTER — Ambulatory Visit: Payer: Self-pay | Attending: Critical Care Medicine | Admitting: Critical Care Medicine

## 2020-07-24 VITALS — BP 113/73 | HR 83 | Ht 64.0 in | Wt 281.2 lb

## 2020-07-24 DIAGNOSIS — E66813 Obesity, class 3: Secondary | ICD-10-CM

## 2020-07-24 DIAGNOSIS — Z975 Presence of (intrauterine) contraceptive device: Secondary | ICD-10-CM

## 2020-07-24 DIAGNOSIS — H60543 Acute eczematoid otitis externa, bilateral: Secondary | ICD-10-CM

## 2020-07-24 DIAGNOSIS — F319 Bipolar disorder, unspecified: Secondary | ICD-10-CM

## 2020-07-24 DIAGNOSIS — J4540 Moderate persistent asthma, uncomplicated: Secondary | ICD-10-CM

## 2020-07-24 DIAGNOSIS — Z6841 Body Mass Index (BMI) 40.0 and over, adult: Secondary | ICD-10-CM

## 2020-07-24 MED ORDER — LITHIUM CARBONATE ER 300 MG PO TBCR
EXTENDED_RELEASE_TABLET | ORAL | Status: DC
Start: 2020-07-24 — End: 2021-01-01

## 2020-07-24 MED ORDER — ARIPIPRAZOLE 5 MG PO TABS
ORAL_TABLET | ORAL | Status: DC
Start: 2020-07-24 — End: 2021-01-01

## 2020-07-24 MED ORDER — MIRENA (52 MG) 20 MCG/24HR IU IUD: 1.0000 | INTRAUTERINE_SYSTEM | Freq: Once | INTRAUTERINE | 0 refills | Status: DC

## 2020-07-24 NOTE — Assessment & Plan Note (Signed)
Previous intrauterine device has been removed she would like a new one placed she also needs a Pap smear  I will refer her back to Dr. Earlene Plater for IUD placement and I have written a prescription under for the IUD to be obtained from our pharmacy under patient assistance

## 2020-07-24 NOTE — Progress Notes (Signed)
Here today for asthma getting worse with the cold air.  Needs refill on singulair.  Wants to discuss birth control.

## 2020-07-24 NOTE — Assessment & Plan Note (Signed)
-   Continue topical steroids as prescribed

## 2020-07-24 NOTE — Assessment & Plan Note (Signed)
Patient encouraged to follow-up with her mental health provider

## 2020-07-24 NOTE — Progress Notes (Signed)
Subjective:    Patient ID: [name] [name], female    DOB: 03-06-1986, 34 y.o.   MRN: [id number]  History of Present Illness: 03/02/19 This is a 34 year old female referred for evaluation of asthma from urgent care.  The patient history dates back to early March of this year when she developed a febrile illness which sounded very much like COVID.  At that time we were not doing community testing and we do not know whether she had positive COVID or not.  She had sore throat cough nasal congestion shortness of breath chest and back pain muscle aches associated wheezing.  Chest x-ray did show pneumonitis when she was in the emergency room in March.  She was treated with antibiotics and symptomatic support.  Follow-up x-rays at urgent care 2 additional visit showed bibasilar groundglass opacities she was given additional courses of antibiotics to include doxycycline she was given benzonatate sertraline and rest and fluids.  The patient continues to have symptoms nevertheless.  She does not have a prior history of asthma.  She does note wheezing fatigue shortness of breath dizziness.  She is had no fever.  Note with her last urgent care visit in June her COVID test was negative.  The patient has nasal congestion weakness in the limbs as well.  Note she lives in an apartment with leaking water faucets and mold in her bathroom and a leak in the roof.  The landlord has not been willing to make changes in the apartment to help with this condition.  She notes daily sinus pressure and sinus congestion and posterior and anterior nasal drainage.  Last chest x-ray still showed a process in the right and left lower lobes. The patient does have a history of bipolar and is anxious to some degree.  She has smoke exposure in the home.  The patient herself is not a smoker.  She was working at [employer] but has been on leave since March of this year.   The patient's psychiatrist is Dr. Karel Jarvis  04/06/2019 Since the  last office visit the patient's dyspnea is improved.  She is coughing less.  She is working with legal aid to see if she can get out of her lease and move out of the apartment that currently is infested with mold.  The patient declines a flu vaccine at this visit   07/27/2019 Since the last visit in September the patient is continuing to improve.  This is a telephone visit She is having less cough only occasional shortness of breath.  She has had headache which is more pressure-like in nature.  She is needing refills on her Singulair.  She finally moved out of the mold in for stated home and is in a clean environment at this time. She maintains Flovent twice daily 2 puffs and has an AeroChamber for this.  09/06/2019 Since last visit the patient's asthma is in improved control.  She maintains Flovent and Flonase and as needed albuterol along with the Singulair.  She has not had any exacerbations of her asthma since the last visit.  She is moved out of the apartment that was causing significant mold exposure and this is improved her overall situation The patient does have a follow-up with her mental health provider  She does complain of itching in both ears and has a history of eczema on the external ear and behind the ears as well as in the right groin  3/16: F/u of severe persistent asthma and PCP  f/u   The patient notes despite some mild weight loss she is still having dyspnea particular with exertion on the treadmill.  The denies any cough but does note some wheezing.  Note she just recently received her first Covid vaccine injection See asthma assessment below  01/11/2020 Patient seen today in return follow-up and is doing well from the perspective of asthma.  She maintains Flovent inhaler and as needed albuterol.  She follows her peak flow meter and this has been very helpful in determining her stability.  She maintains Singulair daily.  She is stable with regards to her mental health  conditions and follows with psychiatry.  Note the patient does not have discovered food allergies including onions and wishes an allergy evaluation  06/06/2020 Visit follow-up primary care visit for this 34 year old female history of severe persistent asthma with presumed allergic factors including mold exposure and then associated history of eczema of the external ear bilaterally major depression bipolar disorder.  Patient also has an IUD in place and wishes to have this removed.  The patient has since been seen by allergy had an extensive evaluation including skin testing for environmental and food allergens.  She only showed positivity to weeds and carrots.  She states she has difficulty with fruits nuts and other types of food.  She states most of it is when she is putting the food directly in the mouth she has of immediate reaction orally.  Oral allergy syndrome was given considered by the allergist however the work-up has not been revealing.  The patient was recommended to stay on antihistamine histamine and nasal spray with steroid.  She went to urgent care when she had difficulty in her nasal passage with Flonase she was switched to Rhinocort this is cause less side effects for the patient.  Patient does have continued obesity and knows exercise and nutrition is a must.  She states her breathing is somewhat at baseline.  She does have a dry mouth and heartburn type symptoms.  She still has eye watering and postnasal drainage.  She notes increased sneezing and occasional wheezing.  She was also treated for a urinary tract infection she states those symptoms have resolved.  She is very much interested in getting the IUD out.  07/24/2020 This patient is seen today in return follow-up for severe asthma and atopic dermatitis behind the ears. Patient also has bipolar disorder with major depression class III obesity and intrauterine device which recently have been removed and she would like to have a new  one placed.  From a breathing perspective should her asthma is worse in the cold weather. She states her mental health is improved at this time. She would like to have another Mirena IUD placed after the old was removed that was out of date. She remains sexually active and is married. She has no other gynecologic complaints.    Asthma She complains of chest tightness, difficulty breathing and shortness of breath. There is no cough, frequent throat clearing, hemoptysis, sputum production or wheezing. Primary symptoms comments: Uses rescue inhaler and ? If helps. This is a chronic problem. The current episode started more than 1 year ago. The problem occurs intermittently (except in cold weather). The problem has been unchanged. Associated symptoms include headaches, malaise/fatigue, nasal congestion, postnasal drip, rhinorrhea and sneezing. Pertinent negatives include no chest pain, dyspnea on exertion, ear congestion, ear pain, fever, heartburn, myalgias, orthopnea, PND, sore throat or trouble swallowing. Associated symptoms comments: Malaise on new med for this .  Her symptoms are aggravated by exercise, change in weather, exposure to fumes, pollen, exposure to smoke, strenuous activity and eating (smoke). Her past medical history is significant for asthma and pneumonia.    Past Medical History:  Diagnosis Date  . Anxiety   . Depression   . Depression    Phreesia 06/30/2020  . GERD (gastroesophageal reflux disease)   . History of kidney stones   . Pneumonitis 03/02/2019  . Renal disorder      Family History  Problem Relation Age of Onset  . Depression Mother   . Anxiety disorder Mother      Social History   Socioeconomic History  . Marital status: Single    Spouse name: Not on file  . Number of children: Not on file  . Years of education: Not on file  . Highest education level: Not on file  Occupational History  . Not on file  Tobacco Use  . Smoking status: Never Smoker  .  Smokeless tobacco: Never Used  Vaping Use  . Vaping Use: Never used  Substance and Sexual Activity  . Alcohol use: Yes    Alcohol/week: 1.0 standard drink    Types: 1 Glasses of wine per week    Comment: per week  . Drug use: No  . Sexual activity: Yes    Birth control/protection: I.U.D.  Other Topics Concern  . Not on file  Social History Narrative  . Not on file   Social Determinants of Health   Financial Resource Strain: Not on file  Food Insecurity: Not on file  Transportation Needs: Not on file  Physical Activity: Not on file  Stress: Not on file  Social Connections: Not on file  Intimate Partner Violence: Not on file     Allergies  Allergen Reactions  . Other     Bananas, kiwi, corn  . Wellbutrin [Bupropion] Hives     Outpatient Medications Prior to Visit  Medication Sig Dispense Refill  . albuterol (VENTOLIN HFA) 108 (90 Base) MCG/ACT inhaler Inhale 2 puffs into the lungs every 6 (six) hours as needed for wheezing or shortness of breath. 18 g 1  . budesonide (RHINOCORT AQUA) 32 MCG/ACT nasal spray Place 1 spray into both nostrils daily. 8.6 g 0  . diphenhydrAMINE (BENADRYL) 25 MG tablet Take 50 mg by mouth every 6 (six) hours as needed for itching or allergies.    Marland Kitchen escitalopram (LEXAPRO) 20 MG tablet Take 20 mg by mouth every morning.    . fluticasone (FLOVENT HFA) 110 MCG/ACT inhaler Inhale 2 puffs into the lungs 2 (two) times daily. 12 g 11  . lamoTRIgine (LAMICTAL) 200 MG tablet Take 200 mg by mouth at bedtime.    . modafinil (PROVIGIL) 200 MG tablet Take 100 mg by mouth daily.    . montelukast (SINGULAIR) 10 MG tablet TAKE 1 TABLET BY MOUTH EVERYDAY AT BEDTIME 30 tablet 0  . Multiple Vitamin (MULTIVITAMIN) tablet Take 1 tablet by mouth daily.    . naproxen (NAPROSYN) 500 MG tablet Take 1 tablet (500 mg total) by mouth 2 (two) times daily. 30 tablet 0  . norethindrone (ORTHO MICRONOR) 0.35 MG tablet Take 1 tablet (0.35 mg total) by mouth daily. 28 tablet 11   . ondansetron (ZOFRAN ODT) 4 MG disintegrating tablet Take 1 tablet (4 mg total) by mouth every 8 (eight) hours as needed for nausea or vomiting. 20 tablet 0  . pramipexole (MIRAPEX) 1.5 MG tablet Take 1.5 mg by mouth at bedtime.    Marland Kitchen  desonide (DESOWEN) 0.05 % ointment Apply 1 application topically 2 (two) times daily as needed. Eczema behind the ears (Patient not taking: Reported on 07/24/2020) 15 g 2  . ARIPiprazole (ABILIFY) 5 MG tablet Take 5 mg by mouth every morning. (Patient not taking: No sig reported)    . lithium carbonate (LITHOBID) 300 MG CR tablet Take 600 mg by mouth at bedtime. (Patient not taking: Reported on 07/24/2020)    . tamsulosin (FLOMAX) 0.4 MG CAPS capsule Take 1 capsule (0.4 mg total) by mouth daily. (Patient not taking: Reported on 07/24/2020) 30 capsule 0   No facility-administered medications prior to visit.    Review of Systems  Constitutional: Positive for malaise/fatigue and unexpected weight change. Negative for fatigue and fever.  HENT: Positive for congestion, postnasal drip, rhinorrhea and sneezing. Negative for drooling, ear discharge, ear pain, facial swelling, hearing loss, mouth sores, nosebleeds, sinus pressure, sinus pain, sore throat, tinnitus, trouble swallowing and voice change.   Eyes: Negative for photophobia, itching and visual disturbance.  Respiratory: Positive for shortness of breath. Negative for cough, hemoptysis, sputum production, chest tightness and wheezing.   Cardiovascular: Negative for chest pain, dyspnea on exertion, leg swelling and PND.  Gastrointestinal: Negative for abdominal pain and heartburn.       No GERD now  Genitourinary: Negative.   Musculoskeletal: Negative for myalgias.  Skin: Positive for rash.       Behind ears. Cream helps prn  Neurological: Positive for dizziness, light-headedness and headaches. Negative for tremors, seizures, syncope, facial asymmetry, speech difficulty, weakness and numbness.  Hematological:  Negative for adenopathy. Does not bruise/bleed easily.  Psychiatric/Behavioral: Positive for agitation, behavioral problems, decreased concentration and dysphoric mood. Negative for confusion, hallucinations, self-injury, sleep disturbance and suicidal ideas. The patient is nervous/anxious. The patient is not hyperactive.        Breakdown just last night d/t work issues   C   Objective:   Physical Exam  Vitals:   07/24/20 0937  BP: 113/73  Pulse: 83  SpO2: 100%  Weight: 281 lb 3.2 oz (127.6 kg)  Height: 5\' 4"  (1.626 m)   Gen: Pleasant, obese in no distress,  normal affect  ENT: No lesions,  mouth clear,  oropharynx clear, no postnasal drip  Neck: No JVD, no TMG, no carotid bruits  Lungs: No use of accessory muscles, no dullness to percussion, clear without rales or rhonchi  Cardiovascular: RRR, heart sounds normal, no murmur or gallops, no peripheral edema  Abdomen: soft and NT, no HSM,  BS normal  Musculoskeletal: No deformities, no cyanosis or clubbing  Neuro: alert, non focal  Skin: Warm, no lesions or rashes    Depression screen [facility] 2/9 07/24/2020 06/06/2020 01/11/2020 10/18/2019 09/06/2019  Decreased Interest 1 1 0 0 1  Down, Depressed, Hopeless 1 0 0 0 1  PHQ - 2 Score 2 1 0 0 2  Altered sleeping 0 1 1 0 0  Tired, decreased energy 1 1 1 1 1   Change in appetite 1 1 0 0 0  Feeling bad or failure about yourself  1 2 0 1 1  Trouble concentrating 1 1 1 1 1   Moving slowly or fidgety/restless 0 0 0 0 0  Suicidal thoughts 0 0 0 0 0  PHQ-9 Score 6 7 3 3 5   Difficult doing work/chores - Somewhat difficult - Not difficult at all -        Assessment & Plan:  I personally reviewed all images and lab data in the [facility]  system as well as any outside material available during this office visit and agree with the  radiology impressions.   Asthma, moderate persistent Moderate persistent asthma stable at this time  Continue active controller therapy as prescribed  Eczema of  external ear, bilateral Continue topical steroids as prescribed  Bipolar 1 disorder, depressed (HCC) Patient encouraged to follow-up with her mental health provider  Class 3 severe obesity due to excess calories without serious comorbidity with body mass index (BMI) of 45.0 to 49.9 in adult [facility]) Patient currently is in the NOOM program and plans to redouble her efforts in the new year  IUD (intrauterine device) in place Previous intrauterine device has been removed she would like a new one placed she also needs a Pap smear  I will refer her back to Dr. Earlene Plater for IUD placement and I have written a prescription under for the IUD to be obtained from our pharmacy under patient assistance   [name] was seen today for asthma.  Diagnoses and all orders for this visit:  Moderate persistent asthma without complication  Eczema of external ear, bilateral  Bipolar 1 disorder, depressed (HCC)  Class 3 severe obesity due to excess calories without serious comorbidity with body mass index (BMI) of 45.0 to 49.9 in adult [facility])  IUD (intrauterine device) in place  Other orders -     lithium carbonate (LITHOBID) 300 MG CR tablet; HOLD -     ARIPiprazole (ABILIFY) 5 MG tablet; HOLD -     levonorgestrel (MIRENA, 52 MG,) 20 MCG/24HR IUD; 1 Intra Uterine Device (1 each total) by Intrauterine route once for 1 dose.

## 2020-07-24 NOTE — Assessment & Plan Note (Signed)
Patient currently is in the NOOM program and plans to redouble her efforts in the new year

## 2020-07-24 NOTE — Assessment & Plan Note (Signed)
Moderate persistent asthma stable at this time  Continue active controller therapy as prescribed

## 2020-07-24 NOTE — Patient Instructions (Signed)
We will get you a new Mirena IUD and have Dr. Earlene Plater insert this and also perform a Pap smear at the same time  No change in your medications from a lung perspective  Thank you for getting your Covid booster and your flu vaccine  Work on your diet and try to increase exercise level so that you can lose weight to improve your asthma control  See diet recommendations below  Video visit with Dr. Delford Field in 2 months   Obesity, Adult Obesity is having too much body fat. Being obese means that your weight is more than what is healthy for you. BMI is a number that explains how much body fat you have. If you have a BMI of 30 or more, you are obese. Obesity is often caused by eating or drinking more calories than your body uses. Changing your lifestyle can help you lose weight. Obesity can cause serious health problems, such as:  Stroke.  Coronary artery disease (CAD).  Type 2 diabetes.  Some types of cancer, including cancers of the colon, breast, uterus, and gallbladder.  Osteoarthritis.  High blood pressure (hypertension).  High cholesterol.  Sleep apnea.  Gallbladder stones.  Infertility problems. What are the causes?  Eating meals each day that are high in calories, sugar, and fat.  Being born with genes that may make you more likely to become obese.  Having a medical condition that causes obesity.  Taking certain medicines.  Sitting a lot (having a sedentary lifestyle).  Not getting enough sleep.  Drinking a lot of drinks that have sugar in them. What increases the risk?  Having a family history of obesity.  Being an [place] American woman.  Being a Hispanic man.  Living in an area with limited access to: ? [place] [facility], recreation centers, or sidewalks. ? Healthy food choices, such as grocery stores and farmers' markets. What are the signs or symptoms? The main sign is having too much body fat. How is this treated?  Treatment for this condition often includes  changing your lifestyle. Treatment may include: ? Changing your diet. This may include making a healthy meal plan. ? Exercise. This may include activity that causes your heart to beat faster (aerobic exercise) and strength training. Work with your doctor to design a program that works for you. ? Medicine to help you lose weight. This may be used if you are not able to lose 1 pound a week after 6 weeks of healthy eating and more exercise. ? Treating conditions that cause the obesity. ? Surgery. Options may include gastric banding and gastric bypass. This may be done if:  Other treatments have not helped to improve your condition.  You have a BMI of 40 or higher.  You have life-threatening health problems related to obesity. Follow these instructions at home: Eating and drinking   Follow advice from your doctor about what to eat and drink. Your doctor may tell you to: ? Limit fast food, sweets, and processed snack foods. ? Choose low-fat options. For example, choose low-fat milk instead of whole milk. ? Eat 5 or more servings of fruits or vegetables each day. ? Eat at home more often. This gives you more control over what you eat. ? Choose healthy foods when you eat out. ? Learn to read food labels. This will help you learn how much food is in 1 serving. ? Keep low-fat snacks available. ? Avoid drinks that have a lot of sugar in them. These include soda, fruit juice, iced  tea with sugar, and flavored milk.  Drink enough water to keep your pee (urine) pale yellow.  Do not go on fad diets. Physical activity  Exercise often, as told by your doctor. Most adults should get up to 150 minutes of moderate-intensity exercise every week.Ask your doctor: ? What types of exercise are safe for you. ? How often you should exercise.  Warm up and stretch before being active.  Do slow stretching after being active (cool down).  Rest between times of being active. Lifestyle  Work with your  doctor and a food expert (dietitian) to set a weight-loss goal that is best for you.  Limit your screen time.  Find ways to reward yourself that do not involve food.  Do not drink alcohol if: ? Your doctor tells you not to drink. ? You are pregnant, may be pregnant, or are planning to become pregnant.  If you drink alcohol: ? Limit how much you use to:  0-1 drink a day for women.  0-2 drinks a day for men. ? Be aware of how much alcohol is in your drink. In the [place] one drink equals one 12 oz bottle of beer (355 mL), one 5 oz glass of wine (148 mL), or one 1 oz glass of hard liquor (44 mL). General instructions  Keep a weight-loss journal. This can help you keep track of: ? The food that you eat. ? How much exercise you get.  Take over-the-counter and prescription medicines only as told by your doctor.  Take vitamins and supplements only as told by your doctor.  Think about joining a support group.  Keep all follow-up visits as told by your doctor. This is important. Contact a doctor if:  You cannot meet your weight loss goal after you have changed your diet and lifestyle for 6 weeks. Get help right away if you:  Are having trouble breathing.  Are having thoughts of harming yourself. Summary  Obesity is having too much body fat.  Being obese means that your weight is more than what is healthy for you.  Work with your doctor to set a weight-loss goal.  Get regular exercise as told by your doctor. This information is not intended to replace advice given to you by your health care provider. Make sure you discuss any questions you have with your health care provider. Document Revised: 03/25/2018 Document Reviewed: 03/25/2018 Elsevier Patient Education  2020 [employer].

## 2020-07-25 ENCOUNTER — Ambulatory Visit: Payer: Self-pay | Admitting: Internal Medicine

## 2020-07-26 MED FILL — lamoTRIgine 200 MG TABS: 200 | 30 days supply | Qty: 30 | Fill #2

## 2020-07-26 MED FILL — NORETHINDRONE 0.35 MG TABS: 0.35 | 28 days supply | Qty: 28 | Fill #1

## 2020-07-31 ENCOUNTER — Encounter (HOSPITAL_COMMUNITY): Payer: Self-pay | Admitting: Emergency Medicine

## 2020-07-31 ENCOUNTER — Emergency Department (HOSPITAL_COMMUNITY)
Admission: EM | Admit: 2020-07-31 | Discharge: 2020-07-31 | Disposition: A | Discharge status: Home / Self Care | Payer: Self-pay | Attending: Emergency Medicine | Admitting: Emergency Medicine

## 2020-07-31 ENCOUNTER — Emergency Department (HOSPITAL_COMMUNITY): Payer: Self-pay

## 2020-07-31 DIAGNOSIS — Z7951 Long term (current) use of inhaled steroids: Secondary | ICD-10-CM | POA: Insufficient documentation

## 2020-07-31 DIAGNOSIS — R0602 Shortness of breath: Secondary | ICD-10-CM | POA: Insufficient documentation

## 2020-07-31 DIAGNOSIS — J4540 Moderate persistent asthma, uncomplicated: Secondary | ICD-10-CM | POA: Insufficient documentation

## 2020-07-31 LAB — CBC
HCT: 42 % (ref 36.0–46.0)
Hemoglobin: 13.7 g/dL (ref 12.0–15.0)
MCH: 29.7 pg (ref 26.0–34.0)
MCHC: 32.6 g/dL (ref 30.0–36.0)
MCV: 91.1 fL (ref 80.0–100.0)
Platelets: 337 10*3/uL (ref 150–400)
RBC: 4.61 MIL/uL (ref 3.87–5.11)
RDW: 13.2 % (ref 11.5–15.5)
WBC: 7.3 10*3/uL (ref 4.0–10.5)
nRBC: 0 % (ref 0.0–0.2)

## 2020-07-31 LAB — BASIC METABOLIC PANEL
Anion gap: 11 (ref 5–15)
BUN: 8 mg/dL (ref 6–20)
CO2: 23 mmol/L (ref 22–32)
Calcium: 9.4 mg/dL (ref 8.9–10.3)
Chloride: 104 mmol/L (ref 98–111)
Creatinine, Ser: 0.81 mg/dL (ref 0.44–1.00)
GFR, Estimated: >60 mL/min (ref >60)
Glucose, Bld: 107 mg/dL — ABNORMAL HIGH (ref 70–99)
Potassium: 3.6 mmol/L (ref 3.5–5.1)
Sodium: 138 mmol/L (ref 135–145)

## 2020-07-31 IMAGING — DX DG CHEST 2V
2 series · 2 of 2 positions shown · non-contrast
Comparison: CT [DATE].  Chest x-ray [DATE].

CLINICAL DATA: Shortness of breath.

EXAM:
CHEST - 2 VIEW

[w chest pa]
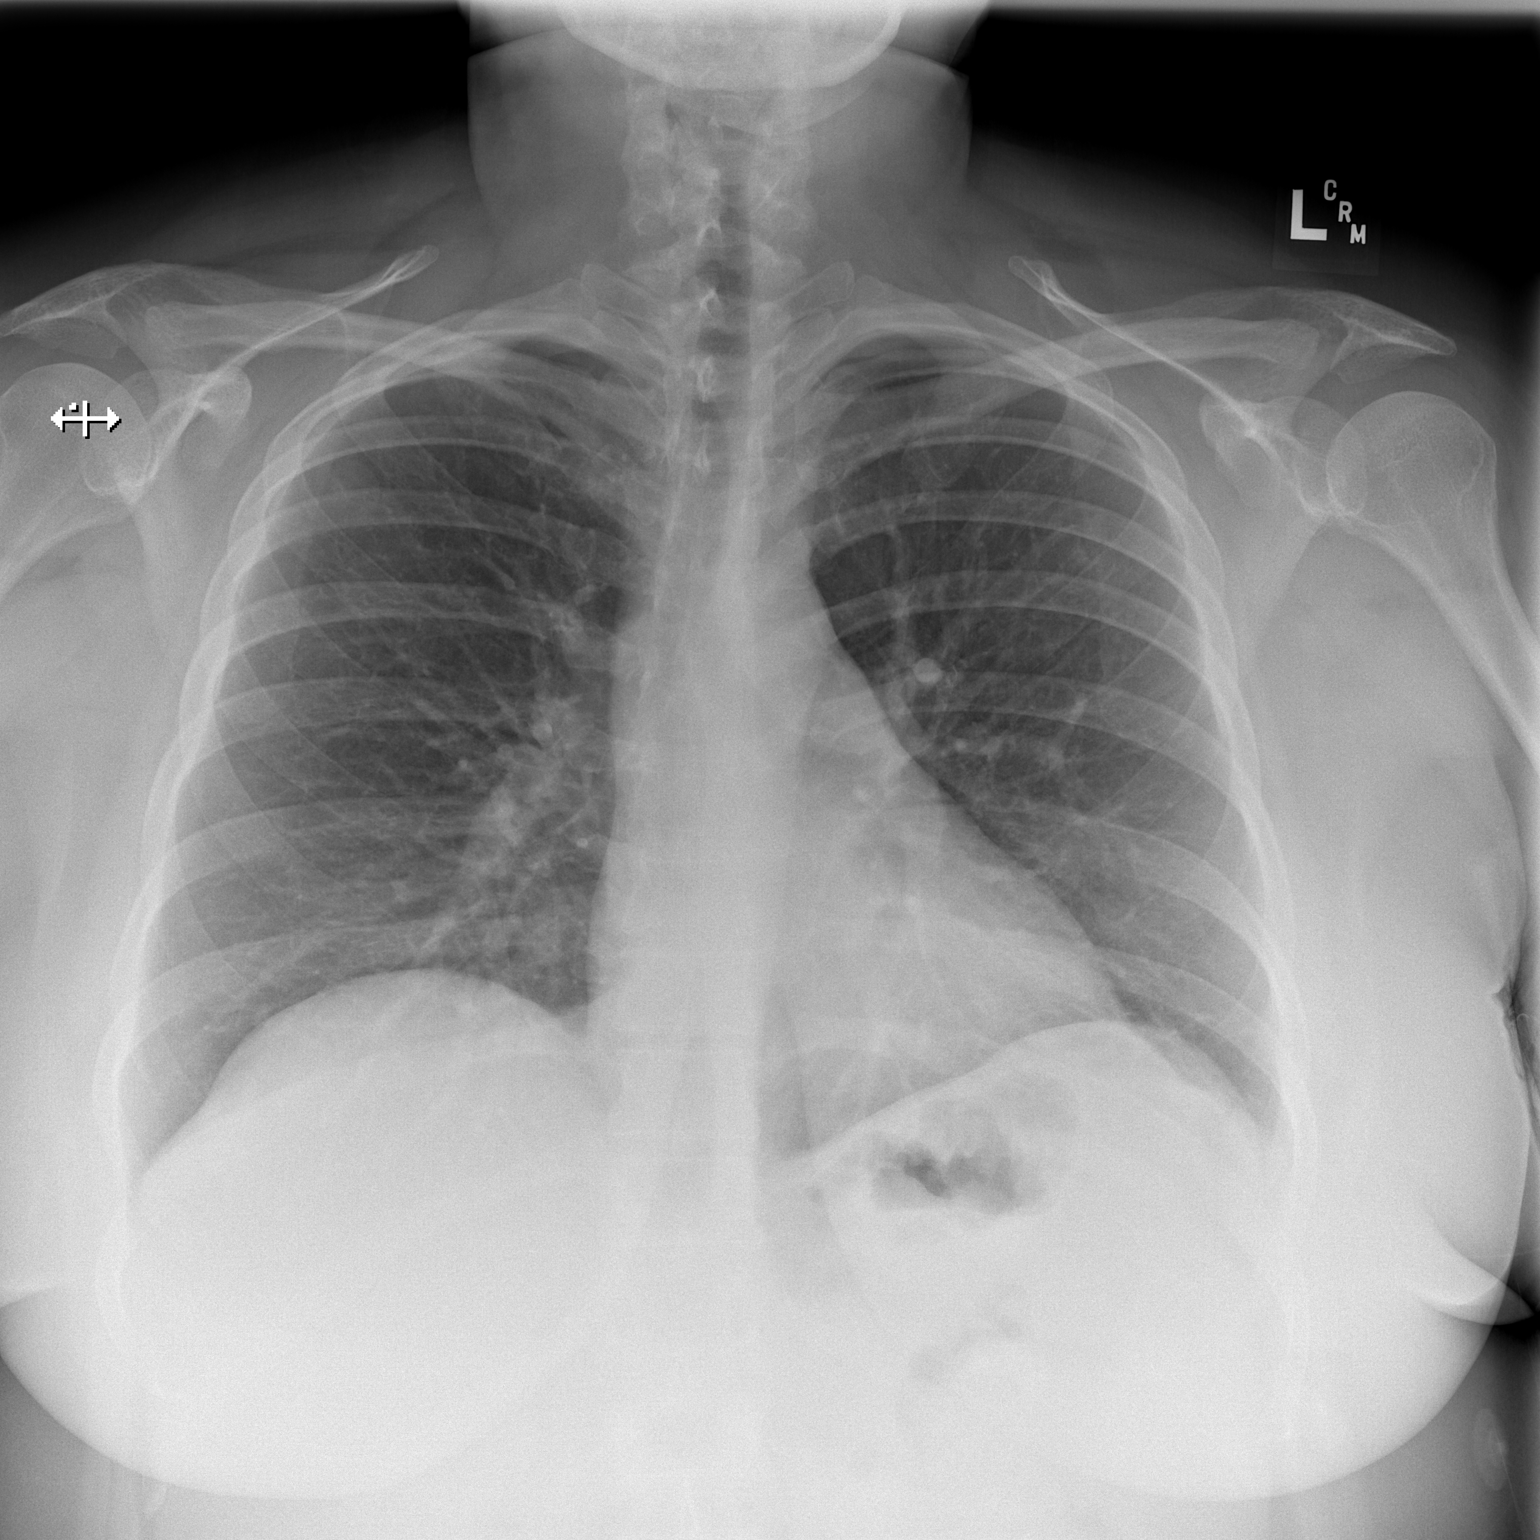

[w chest lat]
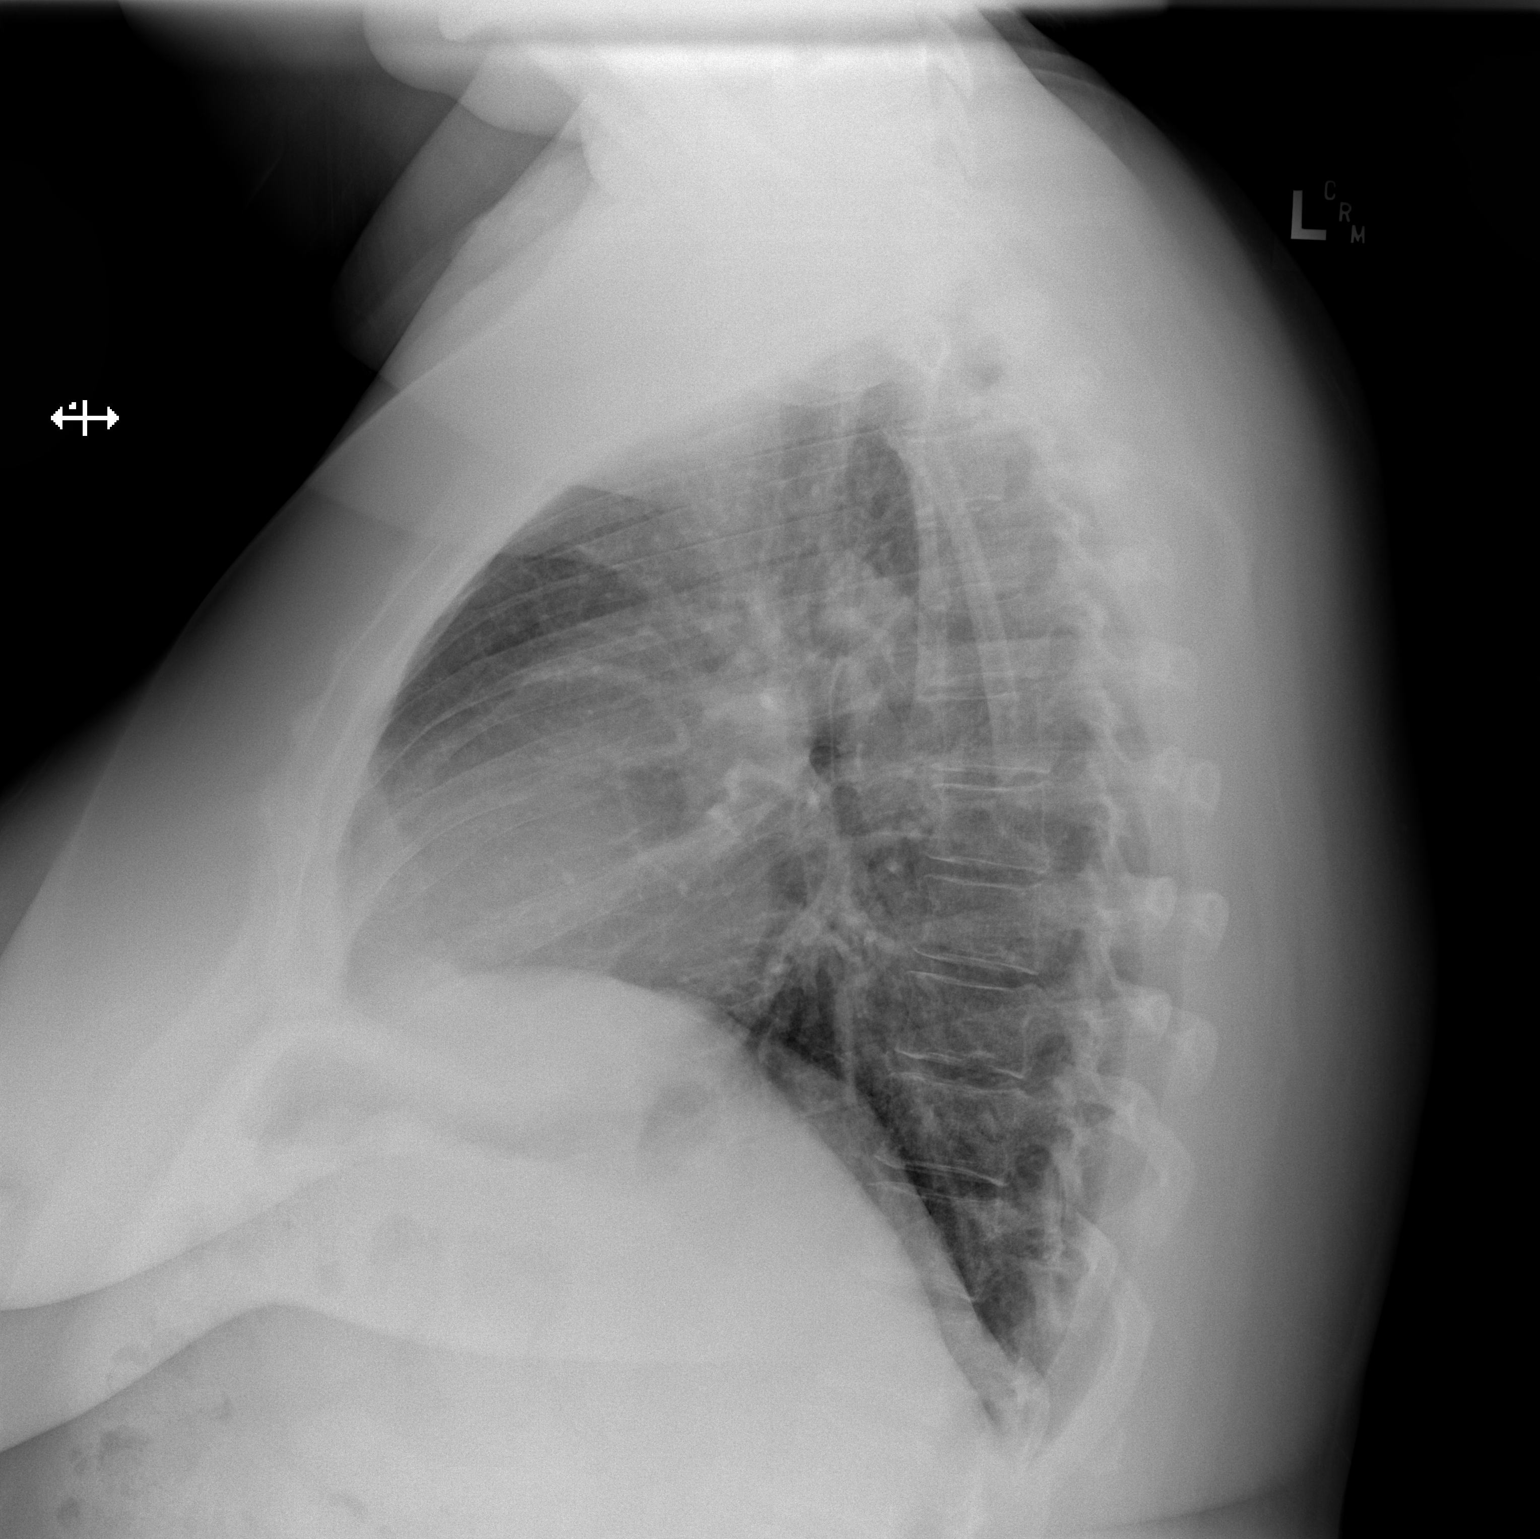

[2 of 2 positions shown; findings below may reference images not displayed]

FINDINGS: Mediastinum and hilar structures normal. Low lung volumes with mild
bibasilar atelectasis and or scarring. No pleural effusion or
pneumothorax. Heart size normal. No acute bony abnormality.
IMPRESSION: Low lung volumes with mild bibasilar atelectasis and or scarring. No
acute abnormality otherwise noted.

## 2020-07-31 MED ORDER — ALBUTEROL SULFATE HFA 108 (90 BASE) MCG/ACT IN AERS: 2.0000 | INHALATION_SPRAY | RESPIRATORY_TRACT | Status: DC | PRN

## 2020-07-31 NOTE — ED Notes (Signed)
Patient verbalizes understanding of discharge instructions. Opportunity for questioning and answers were provided. Armband removed by staff, pt discharged from ED ambulatory to home.  

## 2020-07-31 NOTE — ED Triage Notes (Signed)
Pt reports sob that began last night, hx of asthma, used her inhaler without much relief. Denies cough, fever, or recent sick contacts.

## 2020-07-31 NOTE — ED Provider Notes (Signed)
[facility] EMERGENCY DEPARTMENT Provider Note   CSN: [id number] Arrival date & time: 07/31/20  1314     History Chief Complaint  Patient presents with  . Shortness of Breath    [name] [name] is a 34 y.o. female with PMH of asthma, pneumonitis, and anxiety presents to the ED with complaints of shortness of breath.  Patient reports that she used her inhaler at home without significant relief.  No obvious sick contacts.  On my examination, patient reports that late last night she developed shortness of breath symptoms consistent with her prior asthma attacks/anxiety attacks.  She states that she has never had an asthma exacerbation and a panic attack at the same time, but that is her suspicion.  She states that she was painting the bathroom earlier that day which may have been an irritant.  She went to work this morning, but became progressively more short of breath which prompted her to come to the ED for evaluation.  She states that she was using her albuterol on the way here without much relief, but by the time I examined the patient approximately 4 hours after she presented to the ED, she states that her symptoms have improved.  She is followed closely by psychiatrist for her anxiety symptoms and sees her primary care provider Dr. Rosalia Hammers at [facility] every 2 months to discuss her asthma.  HPI     Past Medical History:  Diagnosis Date  . Anxiety   . Depression   . Depression    Phreesia 06/30/2020  . GERD (gastroesophageal reflux disease)   . History of kidney stones   . Pneumonitis 03/02/2019  . Renal disorder     Patient Active Problem List   Diagnosis Date Noted  . IUD (intrauterine device) in place 06/07/2020  . Adverse food reaction 04/16/2020  . Other atopic dermatitis 04/16/2020  . Food allergy 01/12/2020  . Class 3 severe obesity due to excess calories without serious comorbidity with body mass index (BMI) of 45.0  to 49.9 in adult (HCC) 09/06/2019  . 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 Asthma, moderate persistent 03/02/2019  . Other allergic rhinitis 03/02/2019  . Mold suspected exposure 03/02/2019  . Bipolar 1 disorder, depressed (HCC) 03/02/2019  . 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 CYSTOSCOPY WITH RETROGRADE PYELOGRAM, URETEROSCOPY AND STENT PLACEMENT Right 12/10/2016   Procedure: CYSTOSCOPY WITH RETROGRADE PYELOGRAM, URETEROSCOPY AND STONE REMOVAL;  Surgeon: Heloise Purpura, MD;  Location: [facility];  Service: Urology;  Laterality: Right;  . WISDOM TOOTH EXTRACTION  at age 81     OB History   No obstetric history on file.     Family History  Problem Relation Age of Onset  . Depression Mother   . Anxiety disorder Mother     Social History   Tobacco Use  . Smoking status: Never Smoker  . Smokeless tobacco: Never Used  Vaping Use  . Vaping Use: Never used  Substance Use Topics  . Alcohol use: Yes    Alcohol/week: 1.0 standard drink    Types: 1 Glasses of wine per week    Comment: per week  . Drug use: No    Home Medications Prior to Admission medications   Medication Sig Start Date End Date Taking? Authorizing Provider  albuterol (VENTOLIN HFA) 108 (90 Base) MCG/ACT inhaler Inhale 2 puffs into the lungs every  6 (six) hours as needed for wheezing or shortness of breath. 06/06/20   Storm FriskWright, Patrick E, MD  ARIPiprazole (ABILIFY) 5 MG tablet HOLD 07/24/20   Storm FriskWright, Patrick E, MD  budesonide (RHINOCORT AQUA) 32 MCG/ACT nasal spray Place 1 spray into both nostrils daily. 06/06/20   Storm FriskWright, Patrick E, MD  desonide (DESOWEN) 0.05 % ointment Apply 1 application topically 2 (two) times daily as needed. Eczema behind the ears Patient not taking: Reported on 07/24/2020 05/01/20   Ellamae SiaKim, Yoon M, DO  diphenhydrAMINE (BENADRYL) 25 MG tablet Take 50 mg by mouth every 6 (six) hours as  needed for itching or allergies.    [provider]  escitalopram (LEXAPRO) 20 MG tablet Take 20 mg by mouth every morning. 05/28/20   [provider]  fluticasone (FLOVENT HFA) 110 MCG/ACT inhaler Inhale 2 puffs into the lungs 2 (two) times daily. 06/06/20   Storm FriskWright, Patrick E, MD  lamoTRIgine (LAMICTAL) 200 MG tablet Take 200 mg by mouth at bedtime. 05/28/20   [provider]  levonorgestrel (MIRENA, 52 MG,) 20 MCG/24HR IUD 1 Intra Uterine Device (1 each total) by Intrauterine route once for 1 dose. 07/24/20 07/24/20  Storm FriskWright, Patrick E, MD  lithium carbonate (LITHOBID) 300 MG CR tablet HOLD 07/24/20   Storm FriskWright, Patrick E, MD  modafinil (PROVIGIL) 200 MG tablet Take 100 mg by mouth daily.    [provider]  montelukast (SINGULAIR) 10 MG tablet TAKE 1 TABLET BY MOUTH EVERYDAY AT BEDTIME 07/23/20   Storm FriskWright, Patrick E, MD  Multiple Vitamin (MULTIVITAMIN) tablet Take 1 tablet by mouth daily.    [provider]  naproxen (NAPROSYN) 500 MG tablet Take 1 tablet (500 mg total) by mouth 2 (two) times daily. 06/29/20   Wieters, Hallie C, PA-C  norethindrone (ORTHO MICRONOR) 0.35 MG tablet Take 1 tablet (0.35 mg total) by mouth daily. 07/03/20   Arvilla MarketWallace, Catherine Lauren, DO  ondansetron (ZOFRAN ODT) 4 MG disintegrating tablet Take 1 tablet (4 mg total) by mouth every 8 (eight) hours as needed for nausea or vomiting. 06/29/20   Wieters, Hallie C, PA-C  pramipexole (MIRAPEX) 1.5 MG tablet Take 1.5 mg by mouth at bedtime. 05/28/20   [provider]    Allergies    Other and Wellbutrin [bupropion]  Review of Systems   Review of Systems  All other systems reviewed and are negative.   Physical Exam Updated Vital Signs BP 107/84   Pulse 73   Temp 97.9 F (36.6 C) (Oral)   Resp 15   SpO2 100%   Physical Exam Vitals and nursing note reviewed. Exam conducted with a chaperone present.  Constitutional:      General: She is not in acute distress.     Appearance: Normal appearance. She is not ill-appearing.  HENT:     Head: Normocephalic and atraumatic.  Eyes:     General: No scleral icterus.    Conjunctiva/sclera: Conjunctivae normal.  Cardiovascular:     Rate and Rhythm: Normal rate and regular rhythm.     Pulses: Normal pulses.     Heart sounds: Normal heart sounds.  Pulmonary:     Effort: Pulmonary effort is normal. No respiratory distress.     Breath sounds: Normal breath sounds. No wheezing or rales.  Musculoskeletal:     Cervical back: Normal range of motion.     Right lower leg: No edema.     Left lower leg: No edema.  Skin:    General: Skin is dry.  Capillary Refill: Capillary refill takes less than 2 seconds.  Neurological:     Mental Status: She is alert and oriented to person, place, and time.     GCS: GCS eye subscore is 4. GCS verbal subscore is 5. GCS motor subscore is 6.  Psychiatric:        Mood and Affect: Mood normal.        Behavior: Behavior normal.        Thought Content: Thought content normal.     ED Results / Procedures / Treatments   Labs (all labs ordered are listed, but only abnormal results are displayed) Labs Reviewed  BASIC METABOLIC PANEL - Abnormal; Notable for the following components:      Result Value   Glucose, Bld 107 (*)    All other components within normal limits  CBC    EKG EKG Interpretation  Date/Time:  Tuesday July 31 2020 13:27:47 EST Ventricular Rate:  98 PR Interval:  156 QRS Duration: 72 QT Interval:  334 QTC Calculation: 426 R Axis:   82 Text Interpretation: Normal sinus rhythm Nonspecific T wave abnormality Abnormal ECG No significant change since last tracing Confirmed by Melene Plan [phone]) on 07/31/2020 4:21:10 PM   Radiology DG Chest 2 View  Result Date: 07/31/2020 CLINICAL DATA:  Shortness of breath. EXAM: CHEST - 2 VIEW COMPARISON:  CT 03/15/2019.  Chest x-ray 02/12/2019. FINDINGS: Mediastinum and hilar structures normal. Low lung volumes with  mild bibasilar atelectasis and or scarring. No pleural effusion or pneumothorax. Heart size normal. No acute bony abnormality. IMPRESSION: Low lung volumes with mild bibasilar atelectasis and or scarring. No acute abnormality otherwise noted. Electronically Signed   By: Maisie Fus  Register   On: 07/31/2020 13:58    Procedures Procedures (including critical care time)  Medications Ordered in ED Medications  albuterol (VENTOLIN HFA) 108 (90 Base) MCG/ACT inhaler 2 puff (has no administration in time range)    ED Course  I have reviewed the triage vital signs and the nursing notes.  Pertinent labs & imaging results that were available during my care of the patient were reviewed by me and considered in my medical decision making (see chart for details).    MDM Rules/Calculators/A&P                          Patient's history was concerning for asthma exacerbation versus panic attack.  She states that she was wheezing and having mild cough symptoms initially, but they had resolved by the time my examination.  She states that the albuterol that she used less than finally kicked in.  I suspect that the pain that she used yesterday to renovate her bathroom was a contributing factor.  Her lungs are now CTA bilaterally.  She is calm, cooperative.  Denies any persistent anxiety.  States that she works with her psychiatrist regularly, recently to address seasonal anxiety disorder.  She also states that she speaks with her primary care provider on a regular basis regarding her asthma.  She has already reached out to the nurse at her primary care office will notify her PCP for same tomorrow morning regarding today's encounter.  She is in no acute distress now and resting comfortably with her husband at bedside.  Given her normal exam, do not feel as though treatment with steroids are warranted at this time, particularly given that she suspects that she had superimposed panic attack.  I am afraid that the steroids  might aggravate  her anxiety/panic disorder and she agrees.  She also states that she has plenty of ipratropium and albuterol at home and does not require refill.  Her laboratory work-up is entirely unremarkable, her chest x-ray demonstrates mild bibasilar atelectasis, but no other acute cardiopulmonary disease, and her EKG is consistent with prior tracings.  PERC negative.  She feels prepared for discharge, I feel as though it is reasonable.  Emphasized the importance of close outpatient follow-up.  Strict ED return precautions.  Patient and husband voiced understanding and are agreeable to the plan.  Final Clinical Impression(s) / ED Diagnoses Final diagnoses:  Shortness of breath    Rx / DC Orders ED Discharge Orders    None       Lorelee New, PA-C 07/31/20 1915    Cathren Laine, MD 08/01/20 [phone]

## 2020-07-31 NOTE — Discharge Instructions (Signed)
Please follow-up with your primary care provider regarding today's encounter.  I am glad that your shortness of breath symptoms had improved by the time of my examination.  Given that there is no active wheezing on my exam and your lungs are clear to auscultation bilaterally (normal breath sounds), do not feel as though further intervention is warranted.  I recommend that you stay out of the bathroom that was recently painted with the Velspar paint as that may have precipitated your exacerbation.  Please continue take all of your medications, as directed.  Your exam today and work-up was all very reassuring.  However, please return to the ED or seek immediate medical attention should you experience any new or worsening symptoms.

## 2020-08-01 MED FILL — $MIRENA SYSTEM: 20 | 365 days supply | Qty: 1 | Fill #0

## 2020-08-06 NOTE — Telephone Encounter (Signed)
This pt needs to be added on at 4pm tomorrow as a possible COVID patient

## 2020-08-07 ENCOUNTER — Telehealth: Payer: Self-pay | Admitting: Critical Care Medicine

## 2020-08-07 ENCOUNTER — Other Ambulatory Visit: Payer: Self-pay

## 2020-08-07 ENCOUNTER — Ambulatory Visit: Payer: Self-pay | Attending: Critical Care Medicine | Admitting: Critical Care Medicine

## 2020-08-07 NOTE — Telephone Encounter (Signed)
Called patient to schedule a virtual visit with Dr. Delford Field this afternoon for poss covid per Dr. Delford Field. LVM letting patient know to call and schedule but put patient on at 4:00 per [facility]. If patient calls back please let them know about this appointment.   When they arrive to [facility] we ask that they call the office to let us know they are here so they can have their virtual visit and then be swabbed.

## 2020-08-13 ENCOUNTER — Other Ambulatory Visit: Payer: Self-pay

## 2020-08-13 MED FILL — ESCITALOPRAM 20 MG TABLET: 20 | 90 days supply | Qty: 90 | Fill #0

## 2020-08-13 MED FILL — lamoTRIgine 200 MG TABS: 200 | 90 days supply | Qty: 90 | Fill #0

## 2020-08-13 MED FILL — PRAMIPEXOLE 1.5 MG TABLET: 1.5 | 90 days supply | Qty: 90 | Fill #0

## 2020-08-23 MED FILL — PRAMIPEXOLE 1.5 MG TABLET: 1.5 | 30 days supply | Qty: 30 | Fill #2

## 2020-08-27 ENCOUNTER — Other Ambulatory Visit: Payer: Self-pay | Admitting: Critical Care Medicine

## 2020-08-27 MED FILL — lamoTRIgine 200 MG TABS: 200 | 30 days supply | Qty: 30 | Fill #0

## 2020-08-27 MED FILL — ESCITALOPRAM 20 MG TABLET: 20 | 30 days supply | Qty: 30 | Fill #0

## 2020-08-27 MED FILL — NORETHINDRONE 0.35 MG TABS: 0.35 | 28 days supply | Qty: 28 | Fill #2

## 2020-08-27 MED FILL — MONTELUKAST SOD 10 MG TAB: 10 | 30 days supply | Qty: 30 | Fill #0

## 2020-08-30 ENCOUNTER — Other Ambulatory Visit: Payer: Self-pay

## 2020-08-31 ENCOUNTER — Ambulatory Visit: Payer: Self-pay | Admitting: Internal Medicine

## 2020-09-18 ENCOUNTER — Ambulatory Visit: Payer: Self-pay | Admitting: Internal Medicine

## 2020-09-24 ENCOUNTER — Telehealth: Payer: Self-pay | Admitting: Critical Care Medicine

## 2020-10-01 ENCOUNTER — Ambulatory Visit: Payer: Self-pay | Admitting: Internal Medicine

## 2020-10-01 ENCOUNTER — Other Ambulatory Visit: Payer: Self-pay | Admitting: Critical Care Medicine

## 2020-10-01 DIAGNOSIS — Z30430 Encounter for insertion of intrauterine contraceptive device: Secondary | ICD-10-CM

## 2020-10-01 MED FILL — ESCITALOPRAM 20 MG TABLET: 20 | 30 days supply | Qty: 30 | Fill #1

## 2020-10-01 MED FILL — MONTELUKAST SOD 10 MG TAB: 10 | 30 days supply | Qty: 30 | Fill #0

## 2020-10-01 MED FILL — $FLOVENT HFA 110 MCG/ACT: 110 | 90 days supply | Qty: 36 | Fill #0

## 2020-10-01 MED FILL — PRAMIPEXOLE 1.5 MG TABLET: 1.5 | 90 days supply | Qty: 90 | Fill #0

## 2020-10-01 MED FILL — lamoTRIgine 200 MG TABS: 200 | 30 days supply | Qty: 30 | Fill #1

## 2020-10-08 MED FILL — MONTELUKAST SOD 10 MG TAB: 10 | 30 days supply | Qty: 30 | Fill #0

## 2020-10-08 MED FILL — NORETHINDRONE 0.35 MG TABS: 0.35 | 28 days supply | Qty: 28 | Fill #3

## 2020-11-04 ENCOUNTER — Other Ambulatory Visit: Payer: Self-pay

## 2020-11-07 ENCOUNTER — Other Ambulatory Visit: Payer: Self-pay

## 2020-11-07 MED FILL — Montelukast Sodium Tab 10 MG (Base Equiv): ORAL | 30 days supply | Qty: 30 | Fill #0 | Status: AC

## 2020-11-07 MED FILL — Escitalopram Oxalate Tab 20 MG (Base Equiv): ORAL | 30 days supply | Qty: 30 | Fill #0 | Status: AC

## 2020-11-07 MED FILL — Norethindrone Tab 0.35 MG: ORAL | 28 days supply | Qty: 28 | Fill #0 | Status: AC

## 2020-11-08 ENCOUNTER — Other Ambulatory Visit: Payer: Self-pay

## 2020-11-09 ENCOUNTER — Other Ambulatory Visit: Payer: Self-pay

## 2020-11-09 MED FILL — Lamotrigine Tab 200 MG: ORAL | 30 days supply | Qty: 30 | Fill #0 | Status: AC

## 2020-11-13 ENCOUNTER — Other Ambulatory Visit: Payer: Self-pay

## 2020-11-15 ENCOUNTER — Ambulatory Visit: Payer: Self-pay | Admitting: Obstetrics and Gynecology

## 2020-12-03 ENCOUNTER — Telehealth: Payer: Self-pay | Admitting: Critical Care Medicine

## 2020-12-03 NOTE — Telephone Encounter (Signed)
Pt is calling to schedule appt with Mikle Bosworth for CAFA renewal [phone]

## 2020-12-04 NOTE — Telephone Encounter (Signed)
Called patient and LVM advising her I was calling from [facility] in regards to scheduling a financial appointment. Advised patient to call [phone] to schedule.

## 2020-12-05 ENCOUNTER — Other Ambulatory Visit: Payer: Self-pay

## 2020-12-05 MED FILL — Montelukast Sodium Tab 10 MG (Base Equiv): ORAL | 30 days supply | Qty: 30 | Fill #1 | Status: AC

## 2020-12-05 MED FILL — Lamotrigine Tab 200 MG: ORAL | 30 days supply | Qty: 30 | Fill #1 | Status: AC

## 2020-12-05 MED FILL — Norethindrone Tab 0.35 MG: ORAL | 28 days supply | Qty: 28 | Fill #1 | Status: AC

## 2020-12-06 ENCOUNTER — Other Ambulatory Visit: Payer: Self-pay

## 2020-12-07 ENCOUNTER — Other Ambulatory Visit: Payer: Self-pay

## 2020-12-11 ENCOUNTER — Other Ambulatory Visit: Payer: Self-pay

## 2020-12-11 MED FILL — Escitalopram Oxalate Tab 20 MG (Base Equiv): ORAL | 30 days supply | Qty: 30 | Fill #0 | Status: AC

## 2020-12-12 ENCOUNTER — Telehealth: Payer: Self-pay | Admitting: Critical Care Medicine

## 2020-12-12 ENCOUNTER — Ambulatory Visit: Payer: Self-pay

## 2020-12-12 NOTE — Telephone Encounter (Signed)
I return Pt call, reschedule financial appt for 01/01/21

## 2020-12-12 NOTE — Telephone Encounter (Signed)
Copied from CRM [id number]. Topic: Appointment Scheduling - Scheduling Inquiry for Clinic >> Dec 11, 2020  4:12 PM [name] [name] wrote: Reason for CRM: Pt needs her appt with Mikle Bosworth rescheduled from tomorrow to June 28th or after due to not being able to get an appt with pcp until then/ please advise

## 2020-12-13 ENCOUNTER — Other Ambulatory Visit: Payer: Self-pay

## 2020-12-24 ENCOUNTER — Other Ambulatory Visit: Payer: Self-pay

## 2020-12-24 MED ORDER — ESCITALOPRAM OXALATE 20 MG PO TABS
ORAL_TABLET | ORAL | 2 refills | Status: DC
  Filled 2020-12-24: qty 30, 30d supply, fill #0

## 2020-12-24 MED ORDER — PRAMIPEXOLE DIHYDROCHLORIDE 1.5 MG PO TABS
ORAL_TABLET | ORAL | 2 refills | Status: DC
  Filled 2020-12-24: qty 30, 30d supply, fill #0

## 2020-12-24 MED ORDER — LAMOTRIGINE 200 MG PO TABS
ORAL_TABLET | ORAL | 2 refills | Status: DC
  Filled 2020-12-24: qty 30, 30d supply, fill #0

## 2020-12-25 ENCOUNTER — Other Ambulatory Visit: Payer: Self-pay

## 2020-12-26 ENCOUNTER — Other Ambulatory Visit: Payer: Self-pay
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
## 2021-01-01 ENCOUNTER — Other Ambulatory Visit: Payer: Self-pay

## 2021-01-01 ENCOUNTER — Encounter: Payer: Self-pay | Admitting: Critical Care Medicine

## 2021-01-01 ENCOUNTER — Ambulatory Visit: Payer: Self-pay | Attending: Critical Care Medicine

## 2021-01-01 ENCOUNTER — Ambulatory Visit: Payer: Self-pay | Attending: Critical Care Medicine | Admitting: Critical Care Medicine

## 2021-01-01 VITALS — BP 125/85 | HR 92 | Temp 98.1°F | Resp 20 | Wt 279.0 lb

## 2021-01-01 DIAGNOSIS — L710 Perioral dermatitis: Secondary | ICD-10-CM | POA: Insufficient documentation

## 2021-01-01 DIAGNOSIS — Z124 Encounter for screening for malignant neoplasm of cervix: Secondary | ICD-10-CM

## 2021-01-01 DIAGNOSIS — R21 Rash and other nonspecific skin eruption: Secondary | ICD-10-CM | POA: Insufficient documentation

## 2021-01-01 DIAGNOSIS — J4540 Moderate persistent asthma, uncomplicated: Secondary | ICD-10-CM

## 2021-01-01 DIAGNOSIS — F319 Bipolar disorder, unspecified: Secondary | ICD-10-CM

## 2021-01-01 DIAGNOSIS — J019 Acute sinusitis, unspecified: Secondary | ICD-10-CM

## 2021-01-01 DIAGNOSIS — Z30430 Encounter for insertion of intrauterine contraceptive device: Secondary | ICD-10-CM

## 2021-01-01 DIAGNOSIS — Z6841 Body Mass Index (BMI) 40.0 and over, adult: Secondary | ICD-10-CM

## 2021-01-01 HISTORY — DX: Acute sinusitis, unspecified: J01.90

## 2021-01-01 MED ORDER — DOXYCYCLINE HYCLATE 100 MG PO TABS
100.0000 mg | ORAL_TABLET | Freq: Two times a day (BID) | ORAL | 0 refills | Status: AC
  Filled 2021-01-01: qty 14, 7d supply, fill #0

## 2021-01-01 NOTE — Assessment & Plan Note (Signed)
Take doxycycline prescription as directed. Follow up if concerns or worsening symptoms.

## 2021-01-01 NOTE — Assessment & Plan Note (Signed)
Discussed redness in axillary area is likely benign.  Continue to monitor. Follow up if worsening symptoms or concerns
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
## 2021-01-01 NOTE — Assessment & Plan Note (Signed)
Work Doctor, general practice form filed out and provided to the patient.  Continue medications as prescribed

## 2021-01-01 NOTE — Assessment & Plan Note (Signed)
Take doxycycline prescription as directed.  Follow up if worsening symptoms or concerns.

## 2021-01-01 NOTE — Patient Instructions (Signed)
A referral to gynecology will be made for the IUD placement and for a Pap smear  Begin doxycycline 1 twice daily for the facial infection and for the sinus infection  Stay on all your other medications and inhalers as prescribed  A work accommodation form was completed for you  Return to see Dr. Delford Field 4 months

## 2021-01-03 ENCOUNTER — Other Ambulatory Visit: Payer: Self-pay | Admitting: Critical Care Medicine

## 2021-01-03 ENCOUNTER — Other Ambulatory Visit: Payer: Self-pay

## 2021-01-03 MED ORDER — MONTELUKAST SODIUM 10 MG PO TABS
ORAL_TABLET | ORAL | 2 refills | Status: DC
  Filled 2021-01-03: qty 30, 30d supply, fill #0
  Filled 2021-02-01: qty 30, 30d supply, fill #1
  Filled 2021-03-03: qty 30, 30d supply, fill #2
  Filled 2021-04-02: qty 30, 30d supply, fill #3
  Filled 2021-04-22 – 2021-05-01 (×2): qty 30, 30d supply, fill #4

## 2021-01-03 MED FILL — Norethindrone Tab 0.35 MG: ORAL | 28 days supply | Qty: 28 | Fill #2 | Status: AC

## 2021-01-03 MED FILL — Lamotrigine Tab 200 MG: ORAL | 30 days supply | Qty: 30 | Fill #2 | Status: AC

## 2021-01-03 NOTE — Telephone Encounter (Signed)
Requested Prescriptions  Pending Prescriptions Disp Refills  . montelukast (SINGULAIR) 10 MG tablet 90 tablet 2    Sig: TAKE 1 TABLET BY MOUTH EVERYDAY AT BEDTIME     Pulmonology:  Leukotriene Inhibitors Passed - 01/03/2021  1:11 AM      Passed - Valid encounter within last 12 months    Recent Outpatient Visits          2 days ago Encounter for insertion of mirena IUD   [facility] Storm Frisk, MD   5 months ago Moderate persistent asthma without complication   [facility] Storm Frisk, MD   7 months ago Moderate persistent asthma without complication   [facility] Storm Frisk, MD   11 months ago IUD (intrauterine device) in place   [facility] Storm Frisk, MD   1 year ago Moderate persistent asthma without complication   [facility] Storm Frisk, MD      Future Appointments            In 3 months Storm Frisk, MD [facility]

## 2021-01-08 ENCOUNTER — Other Ambulatory Visit: Payer: Self-pay

## 2021-01-09 ENCOUNTER — Other Ambulatory Visit: Payer: Self-pay

## 2021-01-29 ENCOUNTER — Ambulatory Visit: Payer: Self-pay | Admitting: Critical Care Medicine

## 2021-02-01 ENCOUNTER — Other Ambulatory Visit: Payer: Self-pay

## 2021-02-01 MED FILL — Norethindrone Tab 0.35 MG: ORAL | 28 days supply | Qty: 28 | Fill #3 | Status: AC

## 2021-02-01 MED FILL — Escitalopram Oxalate Tab 20 MG (Base Equiv): ORAL | 30 days supply | Qty: 30 | Fill #1 | Status: AC

## 2021-02-01 MED FILL — Pramipexole Dihydrochloride Tab 1.5 MG: ORAL | 30 days supply | Qty: 30 | Fill #0 | Status: AC

## 2021-02-05 ENCOUNTER — Other Ambulatory Visit: Payer: Self-pay

## 2021-02-07 ENCOUNTER — Telehealth: Payer: Self-pay | Admitting: Critical Care Medicine

## 2021-02-07 NOTE — Telephone Encounter (Signed)
Copied from CRM [id number]. Topic: General - Inquiry >> Feb 01, 2021  1:43 PM Traci Sermon wrote: Reason for CRM: Pt called in stating she had filled out an application about getting financial assistance, but pt states she received a letter back but was unable to obtain the papers needed. Pt requested a call back about having more time, or what she can do to get everything done. Please advise

## 2021-02-08 ENCOUNTER — Other Ambulatory Visit: Payer: Self-pay

## 2021-02-08 MED ORDER — LAMOTRIGINE 200 MG PO TABS
ORAL_TABLET | ORAL | 2 refills | Status: DC
  Filled 2021-02-08: qty 30, 30d supply, fill #0
  Filled 2021-03-03: qty 30, 30d supply, fill #1

## 2021-02-08 NOTE — Telephone Encounter (Signed)
I return Pt call, she was explain the her application was denied until 07/03/21 an any bill until them she will be responsible, if she want to get more info, to please call the billing department an talk to them

## 2021-02-12 ENCOUNTER — Other Ambulatory Visit: Payer: Self-pay

## 2021-02-27 ENCOUNTER — Other Ambulatory Visit: Payer: Self-pay

## 2021-03-03 MED FILL — Norethindrone Tab 0.35 MG: ORAL | 28 days supply | Qty: 28 | Fill #4 | Status: AC

## 2021-03-03 MED FILL — Pramipexole Dihydrochloride Tab 1.5 MG: ORAL | 30 days supply | Qty: 30 | Fill #1 | Status: AC

## 2021-03-03 MED FILL — Escitalopram Oxalate Tab 20 MG (Base Equiv): ORAL | 30 days supply | Qty: 30 | Fill #2 | Status: AC

## 2021-03-04 ENCOUNTER — Other Ambulatory Visit: Payer: Self-pay

## 2021-03-05 ENCOUNTER — Other Ambulatory Visit: Payer: Self-pay

## 2021-03-07 ENCOUNTER — Other Ambulatory Visit: Payer: Self-pay

## 2021-03-07 MED ORDER — VITAMIN D3 50 MCG (2000 UT) PO TABS: ORAL_TABLET | ORAL | 2 refills | Status: DC

## 2021-03-07 MED ORDER — LAMOTRIGINE 200 MG PO TABS
ORAL_TABLET | ORAL | 2 refills | Status: DC
  Filled 2021-03-07 – 2021-04-02 (×2): qty 30, 30d supply, fill #0

## 2021-03-07 MED ORDER — ESCITALOPRAM OXALATE 20 MG PO TABS
ORAL_TABLET | ORAL | 2 refills | Status: DC
  Filled 2021-03-07 – 2021-04-02 (×2): qty 30, 30d supply, fill #0

## 2021-03-07 MED ORDER — PRAMIPEXOLE DIHYDROCHLORIDE 1.5 MG PO TABS
ORAL_TABLET | ORAL | 2 refills | Status: DC
  Filled 2021-03-07 – 2021-04-02 (×2): qty 30, 30d supply, fill #0

## 2021-03-08 ENCOUNTER — Other Ambulatory Visit: Payer: Self-pay

## 2021-03-15 ENCOUNTER — Other Ambulatory Visit: Payer: Self-pay

## 2021-04-02 ENCOUNTER — Other Ambulatory Visit: Payer: Self-pay

## 2021-04-02 MED FILL — Norethindrone Tab 0.35 MG: ORAL | 28 days supply | Qty: 28 | Fill #5 | Status: AC

## 2021-04-04 ENCOUNTER — Other Ambulatory Visit: Payer: Self-pay

## 2021-04-16 ENCOUNTER — Other Ambulatory Visit: Payer: Self-pay

## 2021-04-16 MED ORDER — LAMOTRIGINE 200 MG PO TABS
ORAL_TABLET | ORAL | 2 refills | Status: DC
  Filled 2021-04-22: qty 30, fill #0
  Filled 2021-05-01: qty 30, 30d supply, fill #0

## 2021-04-16 MED ORDER — VITAMIN D3 50 MCG (2000 UT) PO TABS: ORAL_TABLET | ORAL | 2 refills | Status: DC

## 2021-04-16 MED ORDER — PRAMIPEXOLE DIHYDROCHLORIDE 1.5 MG PO TABS
ORAL_TABLET | ORAL | 2 refills | Status: DC
  Filled 2021-04-22: qty 30, fill #0
  Filled 2021-05-01: qty 30, 30d supply, fill #0

## 2021-04-22 ENCOUNTER — Other Ambulatory Visit: Payer: Self-pay

## 2021-04-22 MED ORDER — ESCITALOPRAM OXALATE 5 MG PO TABS
ORAL_TABLET | ORAL | 1 refills | Status: DC
  Filled 2021-04-22 – 2021-05-01 (×2): qty 30, 7d supply, fill #0

## 2021-04-23 ENCOUNTER — Encounter: Payer: Self-pay | Admitting: Family Medicine

## 2021-04-23 ENCOUNTER — Ambulatory Visit: Payer: Self-pay | Admitting: Critical Care Medicine

## 2021-04-29 ENCOUNTER — Telehealth: Payer: Self-pay | Admitting: Physician Assistant

## 2021-04-29 ENCOUNTER — Other Ambulatory Visit: Payer: Self-pay

## 2021-04-29 ENCOUNTER — Ambulatory Visit: Payer: Self-pay | Admitting: *Deleted

## 2021-04-29 ENCOUNTER — Ambulatory Visit
Admission: EM | Admit: 2021-04-29 | Discharge: 2021-04-29 | Disposition: A | Discharge status: Home / Self Care | Payer: Self-pay | Attending: Emergency Medicine | Admitting: Emergency Medicine

## 2021-04-29 DIAGNOSIS — G44221 Chronic tension-type headache, intractable: Secondary | ICD-10-CM

## 2021-04-29 DIAGNOSIS — F1593 Other stimulant use, unspecified with withdrawal: Secondary | ICD-10-CM

## 2021-04-29 DIAGNOSIS — G43019 Migraine without aura, intractable, without status migrainosus: Secondary | ICD-10-CM

## 2021-04-29 DIAGNOSIS — R55 Syncope and collapse: Secondary | ICD-10-CM

## 2021-04-29 DIAGNOSIS — G4452 New daily persistent headache (NDPH): Secondary | ICD-10-CM

## 2021-04-29 MED ORDER — KETOROLAC TROMETHAMINE 60 MG/2ML IM SOLN
60.0000 mg | Freq: Once | INTRAMUSCULAR | Status: AC
  Administered 2021-04-29: 60 mg via INTRAMUSCULAR

## 2021-04-29 NOTE — Discharge Instructions (Addendum)
As you have probably already guessed, the most common adverse reactions with withdrawal of [place] include sleepiness, nausea, headache, irritability, rapid heart rate, fatigue and decreased appetite.  Because this headache does not have the classic symptoms of migraine, it is my opinion that the headache you have had for the last 4 days is been related to withdrawal from [place].  I do not see any meaningful data regarding duration of therapy versus withdrawal symptoms but I do encourage you to contact the prescriber of this medication to discuss the events of the past few days and to search for alternate therapy for your ADHD.  You received an injection of a nonsteroidal anti-inflammatory medication called ketorolac, given your good response to Advil, I anticipate this will relieve your headache symptoms.  Should you require second dose please feel free to return to the clinic for this.  In the meantime, as we discussed, please attempt to continue Advil every 8 hours as needed even if it is just for a little bit of headache pain.

## 2021-04-29 NOTE — Telephone Encounter (Signed)
Pt called in to make an appt with [facility] however she did a virtual visit earlier today and was instructed to go to the urgent care or ED.   The agent asked if I would talk with her.  I let her know she needed to go to the urgent care or ED per the instructions given in the virtual visit note.   She was agreeable but went to the [facility] on [address] and there was a long wait.     We went over some options of other urgent cares she could go to.    She agreed to go to the new ED at [facility] on [address]   Her husband is taking her there now.   She thanked me for my help.

## 2021-04-29 NOTE — ED Triage Notes (Signed)
Pt reports having frontal migraines since Friday.   Home interventions: ice, tylenol not helpful, Aleve was somewhat helpful

## 2021-04-29 NOTE — Progress Notes (Signed)
Virtual Visit Consent   [name] [name], you are scheduled for a virtual visit with a [facility] provider today.     Just as with appointments in the office, your consent must be obtained to participate.  Your consent will be active for this visit and any virtual visit you may have with one of our providers in the next 365 days.     If you have a MyChart account, a copy of this consent can be sent to you electronically.  All virtual visits are billed to your insurance company just like a traditional visit in the office.    As this is a virtual visit, video technology does not allow for your provider to perform a traditional examination.  This may limit your provider's ability to fully assess your condition.  If your provider identifies any concerns that need to be evaluated in person or the need to arrange testing (such as labs, EKG, etc.), we will make arrangements to do so.     Although advances in technology are sophisticated, we cannot ensure that it will always work on either your end or our end.  If the connection with a video visit is poor, the visit may have to be switched to a telephone visit.  With either a video or telephone visit, we are not always able to ensure that we have a secure connection.     I need to obtain your verbal consent now.   Are you willing to proceed with your visit today?    [name] [name] has provided verbal consent on 04/29/2021 for a virtual visit (video or telephone).   Piedad Climes, [place]   Date: 04/29/2021 12:56 PM   Virtual Visit via Video Note   I, Piedad Climes, connected with  [name] [name]  ([id number], 07/14/1986) on 04/29/21 at 12:45 PM EDT by a video-enabled telemedicine application and verified that I am speaking with the correct person using two identifiers.  Location: Patient: Virtual Visit Location Patient: Home Provider: Virtual Visit Location Provider: Home Office   I discussed the limitations of evaluation and  management by telemedicine and the availability of in person appointments. The patient expressed understanding and agreed to proceed.    History of Present Illness: [name] [name] is a 35 y.o. who identifies as a female who was assigned female at birth, and is being seen today for migraine for the past couple of days. Endorses that symptoms initially starting over weekend following a mild GI bug. Notes headache was most severe over weekend and caused her to have a syncopal even. Notes this was post-emesis.  Since then has felt tired and weak. Harder to focus on things. Denies vision changes. Notes brain fog. Headache is markedly improved but still present and is now diffuse. Over frontal region. Has taken Tylenol every 4 hours and a Xanax without any significant improvement in her symptoms  Then took Advil which seemed to help more along with an old Rx for Zofran. Endorses history of milder migraines but this has been more significant than previous.   HPI: HPI  Problems:  Patient Active Problem List   Diagnosis Date Noted   Perioral dermatitis 01/01/2021   Rash 01/01/2021   Sinusitis, acute 01/01/2021   IUD (intrauterine device) in place 06/07/2020   Adverse food reaction 04/16/2020   Other atopic dermatitis 04/16/2020   Food allergy 01/12/2020   Class 3 severe obesity due to excess calories without serious comorbidity with body mass index (BMI)  of 45.0 to 49.9 in adult [facility]) 09/06/2019   Eczema of external ear, 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   Asthma, moderate persistent 03/02/2019   Other allergic rhinitis 03/02/2019   Mold suspected exposure 03/02/2019   Bipolar 1 disorder, depressed (HCC) 03/02/2019   MDD (major depressive disorder), recurrent severe, without psychosis (HCC) 01/24/2017    Allergies:  Allergies  Allergen Reactions   Other     Bananas, kiwi, corn   Wellbutrin [Bupropion] Hives   Medications:  Current Outpatient Medications:    albuterol (VENTOLIN HFA) 108 (90 Base)  MCG/ACT inhaler, Inhale 2 puffs into the lungs every 6 (six) hours as needed for wheezing or shortness of breath., Disp: 18 g, Rfl: 1   Armodafinil (NUVIGIL) 200 MG TABS, Take 100 mg by mouth daily., Disp: , Rfl:    budesonide (RHINOCORT AQUA) 32 MCG/ACT nasal spray, Place 1 spray into both nostrils daily., Disp: 8.6 g, Rfl: 0   cetirizine (ZYRTEC) 10 MG tablet, Take 10 mg by mouth daily., Disp: , Rfl:    Cholecalciferol (VITAMIN D3) 50 MCG (2000 UT) TABS, Take 1 oral tablet once a day, Disp: 30 tablet, Rfl: 2   Cholecalciferol (VITAMIN D3) 50 MCG (2000 UT) TABS, Take 1 oral tablet once a day, Disp: 30 tablet, Rfl: 2   desonide (DESOWEN) 0.05 % ointment, Apply 1 application topically 2 (two) times daily as needed. Eczema behind the ears, Disp: 15 g, Rfl: 2   escitalopram (LEXAPRO) 20 MG tablet, Take 1 oral tablet every morning, Disp: 30 tablet, Rfl: 2   escitalopram (LEXAPRO) 5 MG tablet, Take 4 tabs (20mg ) by mouth for 1 week and then decrease by 5mg  every week as tolerated, Disp: 30 tablet, Rfl: 1   fluticasone (FLOVENT HFA) 110 MCG/ACT inhaler, Inhale 2 puffs into the lungs 2 (two) times daily., Disp: 12 g, Rfl: 11   lamoTRIgine (LAMICTAL) 200 MG tablet, TAKE 1 TABLET BY MOUTH AT BEDTIME (IF OUT OF LAMICTAL MORE THAN 1 WK DO NOT RESTART, CALL MD), Disp: 30 tablet, Rfl: 2   lamoTRIgine (LAMICTAL) 200 MG tablet, Take 1 tablet by mouth  at bedtime (if out of lamictal more than 1 wk do not restart, call MD), Disp: 30 tablet, Rfl: 2   lamoTRIgine (LAMICTAL) 200 MG tablet, Take 1 oral tablet at bedtime (if out of lamictal more than 1 wk do not restart, call MD), Disp: 30 tablet, Rfl: 2   lamoTRIgine (LAMICTAL) 200 MG tablet, Take 1 tablet by mouth at bedtime (if out of lamictal more than 1 wk do not restart, call MD), Disp: 30 tablet, Rfl: 2   montelukast (SINGULAIR) 10 MG tablet, TAKE 1 TABLET BY MOUTH EVERYDAY AT BEDTIME, Disp: 90 tablet, Rfl: 2   norethindrone (MICRONOR) 0.35 MG tablet, TAKE 1 TABLET  (0.35 MG TOTAL) BY MOUTH DAILY., Disp: 28 tablet, Rfl: 11   pramipexole (MIRAPEX) 1.5 MG tablet, TAKE 1 TABLET BY MOUTH AT BEDTIME., Disp: 30 tablet, Rfl: 2   pramipexole (MIRAPEX) 1.5 MG tablet, Take 1 oral tablet at bedtime, Disp: 30 tablet, Rfl: 2   pramipexole (MIRAPEX) 1.5 MG tablet, Take 1 tablet by mouth  at bedtime, Disp: 30 tablet, Rfl: 2  Observations/Objective: Patient is well-developed, well-nourished in no acute distress.  Resting comfortably at home.  Head is normocephalic, atraumatic.  No labored breathing. Speech is clear and coherent with logical content.  Patient is alert and oriented at baseline.   Assessment and Plan: 1. Syncope, unspecified syncope type  2. Intractable migraine without aura and  without status migrainosus Needs in-person evaluation. Brief neuro exam via video within normal limits. She is responding appropriately. Significant other is with her. They are to be evaluated in person at least at Urgent Care facility if not ER for ongoing migraine and recent syncopal episode. They voiced understanding and agreement.   Follow Up Instructions: I discussed the assessment and treatment plan with the patient. The patient was provided an opportunity to ask questions and all were answered. The patient agreed with the plan and demonstrated an understanding of the instructions.  A copy of instructions were sent to the patient via MyChart unless otherwise noted below.   The patient was advised to call back or seek an in-person evaluation if the symptoms worsen or if the condition fails to improve as anticipated.  Time:  I spent 15 minutes with the patient via telehealth technology discussing the above problems/concerns.    Piedad Climes, PA-C

## 2021-04-29 NOTE — Telephone Encounter (Signed)
Reason for Disposition  Health Information question, no triage required and triager able to answer question    She did a virtual visit earlier today and was instructed to go to an urgent care or ED.  She was calling in for an appt at [facility].  I spoke with her regarding going to an urgent care or ED per the note in the virtual visit.  Answer Assessment - Initial Assessment Questions 1. REASON FOR CALL or QUESTION: "What is your reason for calling today?" or "How can I best help you?" or "What question do you have that I can help answer?"      Pt had a virtual visit today and was instructed to go to the urgent care or ED.  She called into [facility] for an appt.   The agent asked that I speak with her.  Protocols used: Information Only Call - No Triage-A-AH

## 2021-04-29 NOTE — ED Provider Notes (Signed)
[facility]    CSN: [id number] Arrival date & time: 04/29/21  1349      History   Chief Complaint Chief Complaint  Patient presents with   Migraine    HPI [name] [name] is a 35 y.o. female.   Patient reports mental health history of bipolar disorder and ADHD.  Patient states that she was recently started on a medication called Vaughan Basta which is a nonstimulant medication for ADHD.  Patient states she took the medication for 9 days but noticed that she was feeling intolerably somnolent while taking it so she discontinued the medication.  This was 4 days ago.  Patient states that 1 day later, she began to have a very intense headache unlike any headache she has had before but not the worst headache of her life.  Patient states she suffered from migraines in the past and this was very dissimilar.  Patient states that headache was global across the front of her upper forehead, did not lateralize to either side.  Patient states she did experience photophobia, nausea and vomiting with the headache.  Patient states that yesterday she had approximately 2 hours of near relief of the headache so she got up and started doing some chores around the house whereupon the headache resumed.  Patient states she is back to the initial baseline of the headache which is still an intense and intolerable pain.  Of note, patient states that she did take some Advil yesterday evening which gave her those few hours of good relief.  Patient denies sore throat, fever, body ache, chill, congestion, rhinitis, cough.  The history is provided by the patient and the spouse.  Migraine This is a new problem. The current episode started more than 2 days ago. The problem occurs constantly. Pertinent negatives include no chest pain and no shortness of breath.   Past Medical History:  Diagnosis Date   Anxiety    Depression    Depression    [employer] 06/30/2020   GERD (gastroesophageal reflux disease)    History  of kidney stones    Pneumonitis 03/02/2019   Renal disorder     Patient Active Problem List   Diagnosis Date Noted   Perioral dermatitis 01/01/2021   Rash 01/01/2021   Sinusitis, acute 01/01/2021   IUD (intrauterine device) in place 06/07/2020   Adverse food reaction 04/16/2020   Other atopic dermatitis 04/16/2020   Food allergy 01/12/2020   Class 3 severe obesity due to excess calories without serious comorbidity with body mass index (BMI) of 45.0 to 49.9 in adult (HCC) 09/06/2019   Eczema of external ear, 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   Asthma, moderate persistent 03/02/2019   Other allergic rhinitis 03/02/2019   Mold suspected exposure 03/02/2019   Bipolar 1 disorder, depressed (HCC) 03/02/2019   MDD (major depressive disorder), recurrent severe, without psychosis (HCC) 01/24/2017    Past Surgical History:  Procedure Laterality Date   CYSTOSCOPY WITH RETROGRADE PYELOGRAM, URETEROSCOPY AND STENT PLACEMENT Right 12/10/2016   Procedure: CYSTOSCOPY WITH RETROGRADE PYELOGRAM, URETEROSCOPY AND STONE REMOVAL;  Surgeon: Heloise Purpura, MD;  Location: [facility];  Service: Urology;  Laterality: Right;   WISDOM TOOTH EXTRACTION  at age 69    OB History   No obstetric history on file.      Home Medications    Prior to Admission medications   Medication Sig Start Date End Date Taking? Authorizing Provider  albuterol (VENTOLIN HFA) 108 (90 Base) MCG/ACT inhaler Inhale 2 puffs into the lungs every 6 (six)  hours as needed for wheezing or shortness of breath. 06/06/20   Storm Frisk, MD  Armodafinil (NUVIGIL) 200 MG TABS Take 100 mg by mouth daily.    [provider]  budesonide (RHINOCORT AQUA) 32 MCG/ACT nasal spray Place 1 spray into both nostrils daily. 06/06/20   Storm Frisk, MD  cetirizine (ZYRTEC) 10 MG tablet Take 10 mg by mouth daily.    [provider]  Cholecalciferol (VITAMIN D3) 50 MCG (2000 UT) TABS Take 1 oral tablet once a day 03/07/21     Cholecalciferol  (VITAMIN D3) 50 MCG (2000 UT) TABS Take 1 oral tablet once a day 04/15/21     desonide (DESOWEN) 0.05 % ointment Apply 1 application topically 2 (two) times daily as needed. Eczema behind the ears 05/01/20   Ellamae Sia, DO  escitalopram (LEXAPRO) 20 MG tablet Take 1 oral tablet every morning 03/07/21     escitalopram (LEXAPRO) 5 MG tablet Take 4 tabs (20mg ) by mouth for 1 week and then decrease by 5mg  every week as tolerated 04/22/21     fluticasone (FLOVENT HFA) 110 MCG/ACT inhaler Inhale 2 puffs into the lungs 2 (two) times daily. 10/01/20   04/24/21, MD  lamoTRIgine (LAMICTAL) 200 MG tablet TAKE 1 TABLET BY MOUTH AT BEDTIME (IF OUT OF LAMICTAL MORE THAN 1 WK DO NOT RESTART, CALL MD) 10/01/20 10/01/21    lamoTRIgine (LAMICTAL) 200 MG tablet Take 1 tablet by mouth  at bedtime (if out of lamictal more than 1 wk do not restart, call MD) 02/08/21     lamoTRIgine (LAMICTAL) 200 MG tablet Take 1 oral tablet at bedtime (if out of lamictal more than 1 wk do not restart, call MD) 03/07/21     lamoTRIgine (LAMICTAL) 200 MG tablet Take 1 tablet by mouth at bedtime (if out of lamictal more than 1 wk do not restart, call MD) 04/15/21     montelukast (SINGULAIR) 10 MG tablet TAKE 1 TABLET BY MOUTH EVERYDAY AT BEDTIME 01/03/21 01/03/22  03/05/21, MD  norethindrone (MICRONOR) 0.35 MG tablet TAKE 1 TABLET (0.35 MG TOTAL) BY MOUTH DAILY. 07/03/20 07/03/21  07/05/20, MD  pramipexole (MIRAPEX) 1.5 MG tablet TAKE 1 TABLET BY MOUTH AT BEDTIME. 10/01/20 10/01/21    pramipexole (MIRAPEX) 1.5 MG tablet Take 1 oral tablet at bedtime 03/07/21     pramipexole (MIRAPEX) 1.5 MG tablet Take 1 tablet by mouth  at bedtime 04/15/21       Family History Family History  Problem Relation Age of Onset   Depression Mother    Anxiety disorder Mother     Social History Social History   Tobacco Use   Smoking status: Never   Smokeless tobacco: Never  Vaping Use   Vaping Use: Never used  Substance Use Topics    Alcohol use: Yes    Alcohol/week: 1.0 standard drink    Types: 1 Glasses of wine per week    Comment: per week   Drug use: No     Allergies   Other and Wellbutrin [bupropion]   Review of Systems Review of Systems  Respiratory:  Negative for shortness of breath.   Cardiovascular:  Negative for chest pain.  All other systems reviewed and are negative.   Physical Exam Triage Vital Signs ED Triage Vitals  Enc Vitals Group     BP 04/29/21 1550 137/90     Pulse Rate 04/29/21 1550 82     Resp 04/29/21 1550 18  Temp 04/29/21 1550 98.9 F (37.2 C)     Temp Source 04/29/21 1550 Oral     SpO2 04/29/21 1550 98 %     Weight --      Height --      Head Circumference --      Peak Flow --      Pain Score 04/29/21 1547 7     Pain Loc --      Pain Edu? --      Excl. in GC? --    No data found.  Updated Vital Signs BP 137/90 (BP Location: Left Arm)   Pulse 82   Temp 98.9 F (37.2 C) (Oral)   Resp 18   LMP 04/24/2021 (Approximate)   SpO2 98%   Visual Acuity Right Eye Distance:   Left Eye Distance:   Bilateral Distance:    Right Eye Near:   Left Eye Near:    Bilateral Near:     Physical Exam Vitals and nursing note reviewed.  Constitutional:      General: She is not in acute distress.    Appearance: Normal appearance. She is obese. She is ill-appearing. She is not toxic-appearing or diaphoretic.  HENT:     Head: Normocephalic and atraumatic.     Right Ear: Tympanic membrane, ear canal and external ear normal.     Left Ear: Tympanic membrane, ear canal and external ear normal.     Nose: Nose normal.     Mouth/Throat:     Mouth: Mucous membranes are moist.     Pharynx: Oropharynx is clear.  Eyes:     Extraocular Movements: Extraocular movements intact.     Conjunctiva/sclera: Conjunctivae normal.     Pupils: Pupils are equal, round, and reactive to light.  Cardiovascular:     Rate and Rhythm: Normal rate and regular rhythm.     Pulses: Normal pulses.      Heart sounds: Normal heart sounds.  Pulmonary:     Effort: Pulmonary effort is normal.     Breath sounds: Normal breath sounds.  Abdominal:     General: Abdomen is flat. Bowel sounds are normal.     Palpations: Abdomen is soft.  Musculoskeletal:        General: Normal range of motion.     Cervical back: Normal range of motion and neck supple.  Skin:    General: Skin is warm and dry.  Neurological:     General: No focal deficit present.     Mental Status: She is alert and oriented to person, place, and time. Mental status is at baseline.  Psychiatric:        Mood and Affect: Mood normal.        Behavior: Behavior normal.     UC Treatments / Results  Labs (all labs ordered are listed, but only abnormal results are displayed) Labs Reviewed - No data to display  EKG   Radiology No results found.  Procedures Procedures (including critical care time)  Medications Ordered in UC Medications  ketorolac (TORADOL) injection 60 mg (60 mg Intramuscular Given 04/29/21 1621)    Initial Impression / Assessment and Plan / UC Course  I have reviewed the triage vital signs and the nursing notes.  Pertinent labs & imaging results that were available during my care of the patient were reviewed by me and considered in my medical decision making (see chart for details).     Based on the history provided by patient and my physical exam findings,  is my opinion that she is suffering from a withdrawal headache after stopping Qelbree.  I provided her with an injection of ketorolac today for anti-inflammatory pain relief.  Patient advised to continue Advil should any of her headache symptoms return.  Patient was also advised to reach out to the provider that prescribed Qelbree for her so they can discuss alternative therapies for her.  Patient verbalized understanding and agreement with plan. 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  Because this headache does not have the classic symptoms of migraine, it is my opinion that the headache you have had for the last 4 days is been related to withdrawal from Simpson.  I do not see any meaningful data regarding duration of therapy versus withdrawal symptoms but I do encourage you to contact the prescriber of this medication to discuss the events of the past few days and to search for alternate therapy for your ADHD.  You received an injection of a nonsteroidal anti-inflammatory medication called ketorolac, given your good response to Advil, I anticipate this will relieve your headache symptoms.  Should you require second dose please feel free to return to the clinic for this.  In the meantime, as we discussed, please attempt to continue Advil every 8 hours as needed even if it is just for a little bit of headache pain.     ED Prescriptions   None    PDMP not reviewed this encounter.   Theadora Rama Scales, PA-C 04/29/21 [phone]

## 2021-05-01 ENCOUNTER — Other Ambulatory Visit: Payer: Self-pay

## 2021-05-02 ENCOUNTER — Other Ambulatory Visit: Payer: Self-pay

## 2021-05-06 ENCOUNTER — Other Ambulatory Visit: Payer: Self-pay

## 2021-05-08 ENCOUNTER — Ambulatory Visit: Payer: Self-pay | Admitting: *Deleted

## 2021-05-08 NOTE — Telephone Encounter (Signed)
Pt has called in to make an appt with Dr Delford Field as her Manson Passey has taken a turn for the worse with weather change. She is missing work and having to leave work with attacks. She sees Dr Delford Field as he is Pulmonary but earliest app is 11/30. I gave her an sooner appt with [facility]. 10/20  She wants a cb as she feels her lungs are worsening and wants to make sure she is ok to wait for appt. [phone]   Called patient to review symptoms. No answer, left message on voicemail to call clinic back.

## 2021-05-08 NOTE — Telephone Encounter (Signed)
Summary: pt needs to discuss worsning asmatha attacks   Pt has called in to make an appt with Dr Delford Field as her Manson Passey has taken a turn for the worse with weather change. She is missing work and having to leave work with attacks. She sees Dr Delford Field as he is Pulmonary but earliest app is 11/30. I gave her an sooner appt with [facility]. 10/20  She wants a cb as she feels her lungs are worsening and wants to make sure she is ok to wait for appt. [phone]     Pt reports 1-2 asthma attacks  per week now, onset of worsening 2 months ago. States has been using her inhalers, taking her medications as ordered. States "Seems like with weather change, worsened." States "At times seems like I can't get air out of my lungs." Reports peak flow 300 , best 475.States "I don't feel like my asthma has been managed well lately.I've missed work."  Prefers to see Dr. Delford Field. Earliest appt 07/03/21.  Agent made appt with Mary[facility] Land earlier for 05/23/21. Advised ED for worsening symptoms. Assured pt NT would route to practice for PCPs review and final disposition.   CB# [phone]      Reason for Disposition  [1] Longstanding difficulty breathing AND [2] not responding to usual therapy    Onset increasing asthma attacks 2 months ago  Answer Assessment - Initial Assessment Questions 1. RESPIRATORY STATUS: "Describe your breathing?" (e.g., wheezing, shortness of breath, unable to speak, severe coughing)      INcreased asthma attacks 2. ONSET: "When did this breathing problem begin?"      2 months ago 3. PATTERN "Does the difficult breathing come and go, or has it been constant since it started?"      Comes and goes 4. SEVERITY: "How bad is your breathing?" (e.g., mild, moderate, severe)    - MILD: No SOB at rest, mild SOB with walking, speaks normally in sentences, can lie down, no retractions, pulse < 100.    - MODERATE: SOB at rest, SOB with minimal exertion and prefers to sit, cannot lie down flat, speaks in  phrases, mild retractions, audible wheezing, pulse 100-120.    - SEVERE: Very SOB at rest, speaks in single words, struggling to breathe, sitting hunched forward, retractions, pulse > 120      1-2 attack a week 5. RECURRENT SYMPTOM: "Have you had difficulty breathing before?" If Yes, ask: "When was the last time?" and "What happened that time?"      asthma 6. CARDIAC HISTORY: "Do you have any history of heart disease?" (e.g., heart attack, angina, bypass surgery, angioplasty)       7. LUNG HISTORY: "Do you have any history of lung disease?"  (e.g., pulmonary embolus, asthma, emphysema)     asthma 8. CAUSE: "What do you think is causing the breathing problem?"      Asthma 9. OTHER SYMPTOMS: "Do you have any other symptoms? (e.g., dizziness, runny nose, cough, chest pain, fever)     fatigued 10. O2 SATURATION MONITOR:  "Do you use an oxygen saturation monitor (pulse oximeter) at home?" If Yes, "What is your reading (oxygen level) today?" "What is your usual oxygen saturation reading?" (e.g., 95%)       NA  Protocols used: Breathing Difficulty-A-AH

## 2021-05-09 NOTE — Telephone Encounter (Signed)
Pt was called and informed that if she became worse that she will need to go to [facility] or ED for evaluation.

## 2021-05-14 ENCOUNTER — Other Ambulatory Visit: Payer: Self-pay

## 2021-05-14 MED ORDER — ESCITALOPRAM OXALATE 20 MG PO TABS
ORAL_TABLET | ORAL | 2 refills | Status: DC
  Filled 2021-05-14: qty 30, 30d supply, fill #0

## 2021-05-14 MED ORDER — VITAMIN D3 50 MCG (2000 UT) PO TABS
ORAL_TABLET | ORAL | 2 refills | Status: DC
  Filled 2021-08-07 – 2021-09-12 (×2): qty 30, fill #0

## 2021-05-14 MED ORDER — PRAMIPEXOLE DIHYDROCHLORIDE 1.5 MG PO TABS
ORAL_TABLET | ORAL | 2 refills | Status: DC
  Filled 2021-05-14: qty 30, 30d supply, fill #0

## 2021-05-14 MED ORDER — LAMOTRIGINE 200 MG PO TABS
ORAL_TABLET | ORAL | 2 refills | Status: DC
  Filled 2021-05-14: qty 30, 30d supply, fill #0

## 2021-05-15 ENCOUNTER — Other Ambulatory Visit: Payer: Self-pay

## 2021-05-15 MED ORDER — CLONIDINE HCL ER 0.1 MG PO TB12
ORAL_TABLET | ORAL | 1 refills | Status: DC
  Filled 2021-05-15 (×2): qty 60, 30d supply, fill #0

## 2021-05-16 ENCOUNTER — Other Ambulatory Visit: Payer: Self-pay

## 2021-05-17 ENCOUNTER — Other Ambulatory Visit: Payer: Self-pay

## 2021-05-22 ENCOUNTER — Other Ambulatory Visit: Payer: Self-pay

## 2021-05-23 ENCOUNTER — Ambulatory Visit: Payer: Self-pay | Attending: Physician Assistant | Admitting: Physician Assistant

## 2021-05-23 ENCOUNTER — Other Ambulatory Visit: Payer: Self-pay

## 2021-05-23 ENCOUNTER — Encounter: Payer: Self-pay | Admitting: Physician Assistant

## 2021-05-23 VITALS — BP 123/84 | HR 94 | Ht 64.0 in | Wt 289.5 lb

## 2021-05-23 DIAGNOSIS — K219 Gastro-esophageal reflux disease without esophagitis: Secondary | ICD-10-CM

## 2021-05-23 DIAGNOSIS — J9801 Acute bronchospasm: Secondary | ICD-10-CM

## 2021-05-23 MED ORDER — OMEPRAZOLE 20 MG PO CPDR
DELAYED_RELEASE_CAPSULE | ORAL | 3 refills | Status: DC
Start: 2021-05-23 — End: 2021-06-12
  Filled 2021-05-23: qty 37, 30d supply, fill #0

## 2021-05-23 NOTE — Patient Instructions (Signed)
Gastroesophageal Reflux Disease, Adult Gastroesophageal reflux (GER) happens when acid from the stomach flows up into the tube that connects the mouth and the stomach (esophagus). Normally, food travels down the esophagus and stays in the stomach to be digested. With GER, food and stomach acid sometimes move back up into the esophagus. You may have a disease called gastroesophageal reflux disease (GERD) if the reflux: Happens often. Causes frequent or very bad symptoms. Causes problems such as damage to the esophagus. When this happens, the esophagus becomes sore and swollen. Over time, GERD can make small holes (ulcers) in the lining of the esophagus. What are the causes? This condition is caused by a problem with the muscle between the esophagus and the stomach. When this muscle is weak or not normal, it does not close properly to keep food and acid from coming back up from the stomach. The muscle can be weak because of: Tobacco use. Pregnancy. Having a certain type of hernia (hiatal hernia). Alcohol use. Certain foods and drinks, such as coffee, chocolate, onions, and peppermint. What increases the risk? Being overweight. Having a disease that affects your connective tissue. Taking NSAIDs, such a ibuprofen. What are the signs or symptoms? Heartburn. Difficult or painful swallowing. The feeling of having a lump in the throat. A bitter taste in the mouth. Bad breath. Having a lot of saliva. Having an upset or bloated stomach. Burping. Chest pain. Different conditions can cause chest pain. Make sure you see your doctor if you have chest pain. Shortness of breath or wheezing. A long-term cough or a cough at night. Wearing away of the surface of teeth (tooth enamel). Weight loss. How is this treated? Making changes to your diet. Taking medicine. Having surgery. Treatment will depend on how bad your symptoms are. Follow these instructions at home: Eating and drinking  Follow a  diet as told by your doctor. You may need to avoid foods and drinks such as: Coffee and tea, with or without caffeine. Drinks that contain alcohol. Energy drinks and sports drinks. Bubbly (carbonated) drinks or sodas. Chocolate and cocoa. Peppermint and mint flavorings. Garlic and onions. Horseradish. Spicy and acidic foods. These include peppers, chili powder, curry powder, vinegar, hot sauces, and BBQ sauce. Citrus fruit juices and citrus fruits, such as oranges, lemons, and limes. Tomato-based foods. These include red sauce, chili, salsa, and pizza with red sauce. Fried and fatty foods. These include donuts, french fries, potato chips, and high-fat dressings. High-fat meats. These include hot dogs, rib eye steak, sausage, ham, and bacon. High-fat dairy items, such as whole milk, butter, and cream cheese. Eat small meals often. Avoid eating large meals. Avoid drinking large amounts of liquid with your meals. Avoid eating meals during the 2-3 hours before bedtime. Avoid lying down right after you eat. Do not exercise right after you eat. Lifestyle  Do not smoke or use any products that contain nicotine or tobacco. If you need help quitting, ask your doctor. Try to lower your stress. If you need help doing this, ask your doctor. If you are overweight, lose an amount of weight that is healthy for you. Ask your doctor about a safe weight loss goal. General instructions Pay attention to any changes in your symptoms. Take over-the-counter and prescription medicines only as told by your doctor. Do not take aspirin, ibuprofen, or other NSAIDs unless your doctor says it is okay. Wear loose clothes. Do not wear anything tight around your waist. Raise (elevate) the head of your bed about 6   inches (15 cm). You may need to use a wedge to do this. Avoid bending over if this makes your symptoms worse. Keep all follow-up visits. Contact a doctor if: You have new symptoms. You lose weight and you  do not know why. You have trouble swallowing or it hurts to swallow. You have wheezing or a cough that keeps happening. You have a hoarse voice. Your symptoms do not get better with treatment. Get help right away if: You have sudden pain in your arms, neck, jaw, teeth, or back. You suddenly feel sweaty, dizzy, or light-headed. You have chest pain or shortness of breath. You vomit and the vomit is green, yellow, or black, or it looks like blood or coffee grounds. You faint. Your poop (stool) is red, bloody, or black. You cannot swallow, drink, or eat. These symptoms may represent a serious problem that is an emergency. Do not wait to see if the symptoms will go away. Get medical help right away. Call your local emergency services (911 in the [place] Do not drive yourself to the hospital. Summary If a person has gastroesophageal reflux disease (GERD), food and stomach acid move back up into the esophagus and cause symptoms or problems such as damage to the esophagus. Treatment will depend on how bad your symptoms are. Follow a diet as told by your doctor. Take all medicines only as told by your doctor. This information is not intended to replace advice given to you by your health care provider. Make sure you discuss any questions you have with your health care provider. Document Revised: 01/30/2020 Document Reviewed: 01/30/2020 Elsevier Patient Education  2022 [employer]  

## 2021-05-23 NOTE — Progress Notes (Signed)
[name] [name], is a 35 y.o. female  BJS:[id number]  YWV:[id number]  DOB - 1986/03/25  Chief Complaint  Patient presents with   Asthma       Subjective:   [name] [name] is a 35 y.o. female here today for continued wheezing and cough despite being on flovent, singulair, zyrtec, and singulair.  She is still using her rescue inhaler about 1-3 times daily.  Symptoms are worse at night.  Some cough.  No mucus.    Also, struggles with various compulsive behaviors.  Currently it is picking her nose; so she is concerned the rhinocort may not be effective.  No nose bleeds.   No problems updated.  ALLERGIES: Allergies  Allergen Reactions   Other     Bananas, kiwi   Wellbutrin [Bupropion] Hives    PAST MEDICAL HISTORY: Past Medical History:  Diagnosis Date   Anxiety    Depression    Depression    [employer] 06/30/2020   GERD (gastroesophageal reflux disease)    History of kidney stones    Pneumonitis 03/02/2019   Renal disorder     MEDICATIONS AT HOME: Prior to Admission medications   Medication Sig Start Date End Date Taking? Authorizing Provider  albuterol (VENTOLIN HFA) 108 (90 Base) MCG/ACT inhaler Inhale 2 puffs into the lungs every 6 (six) hours as needed for wheezing or shortness of breath. 06/06/20  Yes Storm Frisk, MD  budesonide (RHINOCORT AQUA) 32 MCG/ACT nasal spray Place 1 spray into both nostrils daily. 06/06/20  Yes Storm Frisk, MD  cetirizine (ZYRTEC) 10 MG tablet Take 10 mg by mouth daily.   Yes [provider]  Cholecalciferol (VITAMIN D3) 50 MCG (2000 UT) TABS Take 1 oral tablet once a day 05/14/21  Yes   cloNIDine HCl (KAPVAY) 0.1 MG TB12 ER tablet Take 1 tablet by mouth every night at bedtime for 7 days, 2 tabs every night at bedtime for 7 days 05/14/21  Yes   desonide (DESOWEN) 0.05 % ointment Apply 1 application topically 2 (two) times daily as needed. Eczema behind the ears 05/01/20  Yes Ellamae Sia, DO  escitalopram (LEXAPRO) 20 MG  tablet Take 1 tablet by mouth every morning 05/14/21  Yes   fluticasone (FLOVENT HFA) 110 MCG/ACT inhaler Inhale 2 puffs into the lungs 2 (two) times daily. 10/01/20  Yes Storm Frisk, MD  lamoTRIgine (LAMICTAL) 200 MG tablet Take 1 tablet by mouth at bedtime (if out of lamictal more than 1 wk do not restart, call MD) 05/14/21  Yes   montelukast (SINGULAIR) 10 MG tablet TAKE 1 TABLET BY MOUTH EVERYDAY AT BEDTIME 01/03/21 01/03/22 Yes Storm Frisk, MD  omeprazole (PRILOSEC) 20 MG capsule Take 1 tablet by mouth twice daily for 1 week, then decrease to 1 tablet daily 05/23/21  Yes Karron Alvizo M, PA-C  pramipexole (MIRAPEX) 1.5 MG tablet Take 1 tablet by mouth at bedtime 05/14/21  Yes   Armodafinil (NUVIGIL) 200 MG TABS Take 100 mg by mouth daily.    [provider]  Cholecalciferol (VITAMIN D3) 50 MCG (2000 UT) TABS Take 1 oral tablet once a day 03/07/21     Cholecalciferol (VITAMIN D3) 50 MCG (2000 UT) TABS Take 1 oral tablet once a day 04/15/21     escitalopram (LEXAPRO) 20 MG tablet Take 1 oral tablet every morning 03/07/21     escitalopram (LEXAPRO) 5 MG tablet Take 4 tabs (20mg ) by mouth for 1 week and then decrease by 5mg  every week as tolerated  04/22/21     lamoTRIgine (LAMICTAL) 200 MG tablet TAKE 1 TABLET BY MOUTH AT BEDTIME (IF OUT OF LAMICTAL MORE THAN 1 WK DO NOT RESTART, CALL MD) 10/01/20 10/01/21    lamoTRIgine (LAMICTAL) 200 MG tablet Take 1 tablet by mouth  at bedtime (if out of lamictal more than 1 wk do not restart, call MD) 02/08/21     lamoTRIgine (LAMICTAL) 200 MG tablet Take 1 oral tablet at bedtime (if out of lamictal more than 1 wk do not restart, call MD) 03/07/21     lamoTRIgine (LAMICTAL) 200 MG tablet Take 1 tablet by mouth at bedtime (if out of lamictal more than 1 wk do not restart, call MD) 04/15/21     norethindrone (MICRONOR) 0.35 MG tablet TAKE 1 TABLET (0.35 MG TOTAL) BY MOUTH DAILY. 07/03/20 07/03/21  Arvilla Market, MD  pramipexole (MIRAPEX) 1.5 MG  tablet TAKE 1 TABLET BY MOUTH AT BEDTIME. 10/01/20 10/01/21    pramipexole (MIRAPEX) 1.5 MG tablet Take 1 oral tablet at bedtime 03/07/21     pramipexole (MIRAPEX) 1.5 MG tablet Take 1 tablet by mouth  at bedtime 04/15/21       ROS: Neg HEENT Neg cardiac Neg GI Neg GU Neg MS Neg psych Neg neuro  Objective:   Vitals:   05/23/21 1059  BP: 123/84  Pulse: 94  SpO2: 100%  Weight: 289 lb 8 oz (131.3 kg)  Height: 5\' 4"  (1.626 m)   Exam General appearance : Awake, alert, not in any distress. Speech Clear. Not toxic looking HEENT: Atraumatic and Normocephalic Neck: Supple, no JVD. No cervical lymphadenopathy.  Chest: Good air entry bilaterally, CTAB.  No rales/rhonchi/wheezing in the lungs.  There is a wheeze that seems to be coming more from the upper airways/bronchial airways CVS: S1 S2 regular, no murmurs.  Extremities: B/L Lower Ext shows no edema, both legs are warm to touch Neurology: Awake alert, and oriented X 3, CN II-XII intact, Non focal Skin: No Rash  Data Review Lab Results  Component Value Date   HGBA1C 5.0 01/24/2017    Assessment & Plan   1. Gastroesophageal reflux disease without esophagitis Concern for this being a contributing factor to wheezing- - omeprazole (PRILOSEC) 20 MG capsule; Take 1 tablet by mouth twice daily for 1 week, then decrease to 1 tablet daily  Dispense: 37 capsule; Refill: 3  2. Bronchospasm Continue flovent, albuterol, singulair, zyrtec, and rhinocort.  Follow up in 3 weeks with DR 01/26/2017 to further assess    Patient have been counseled extensively about nutrition and exercise. Other issues discussed during this visit include: low cholesterol diet, weight control and daily exercise, foot care, annual eye examinations at Ophthalmology, importance of adherence with medications and regular follow-up. We also discussed long term complications of uncontrolled diabetes and hypertension.   Return in about 3 weeks (around 06/13/2021) for with Dr  13/05/2021 for recheck of wheezing.  The patient was given clear instructions to go to ER or return to medical center if symptoms don't improve, worsen or new problems develop. The patient verbalized understanding. The patient was told to call to get lab results if they haven't heard anything in the next week.      Delford Field, PA-C [facility] [place], [place]   05/23/2021, 12:08 PM Patient ID: [id number] Tufo, female   DOB: Jul 21, 1986, 35 y.o.   MRN: [id number]

## 2021-06-12 ENCOUNTER — Ambulatory Visit: Payer: Self-pay | Attending: Critical Care Medicine | Admitting: Critical Care Medicine

## 2021-06-12 ENCOUNTER — Other Ambulatory Visit: Payer: Self-pay

## 2021-06-12 ENCOUNTER — Encounter: Payer: Self-pay | Admitting: Critical Care Medicine

## 2021-06-12 VITALS — BP 120/81 | HR 100 | Resp 16

## 2021-06-12 DIAGNOSIS — J019 Acute sinusitis, unspecified: Secondary | ICD-10-CM

## 2021-06-12 DIAGNOSIS — R4189 Other symptoms and signs involving cognitive functions and awareness: Secondary | ICD-10-CM

## 2021-06-12 DIAGNOSIS — G90A Postural orthostatic tachycardia syndrome (POTS): Secondary | ICD-10-CM

## 2021-06-12 DIAGNOSIS — Z23 Encounter for immunization: Secondary | ICD-10-CM

## 2021-06-12 DIAGNOSIS — K219 Gastro-esophageal reflux disease without esophagitis: Secondary | ICD-10-CM

## 2021-06-12 DIAGNOSIS — J4540 Moderate persistent asthma, uncomplicated: Secondary | ICD-10-CM

## 2021-06-12 DIAGNOSIS — Z6841 Body Mass Index (BMI) 40.0 and over, adult: Secondary | ICD-10-CM

## 2021-06-12 HISTORY — DX: Other symptoms and signs involving cognitive functions and awareness: R41.89

## 2021-06-12 MED ORDER — OMEPRAZOLE 20 MG PO CPDR
DELAYED_RELEASE_CAPSULE | ORAL | 3 refills | Status: DC
  Filled 2021-06-12: qty 37, fill #0

## 2021-06-12 MED ORDER — MONTELUKAST SODIUM 10 MG PO TABS
ORAL_TABLET | ORAL | 2 refills | Status: DC
  Filled 2021-06-12: qty 30, 30d supply, fill #0

## 2021-06-12 MED ORDER — PRAMIPEXOLE DIHYDROCHLORIDE 1.5 MG PO TABS
ORAL_TABLET | ORAL | 2 refills | Status: DC
  Filled 2021-06-12: qty 30, 30d supply, fill #0

## 2021-06-12 MED ORDER — CETIRIZINE HCL 10 MG PO TABS
10.0000 mg | ORAL_TABLET | Freq: Every day | ORAL | 6 refills | Status: DC
  Filled 2021-06-12: qty 30, 30d supply, fill #0

## 2021-06-12 MED ORDER — ESCITALOPRAM OXALATE 20 MG PO TABS
20.0000 mg | ORAL_TABLET | Freq: Every day | ORAL | 6 refills | Status: DC
Start: 2021-06-12 — End: 2021-06-26
  Filled 2021-06-12: qty 30, 30d supply, fill #0

## 2021-06-12 MED ORDER — BUDESONIDE 32 MCG/ACT NA SUSP
1.0000 | Freq: Every day | NASAL | 6 refills | Status: DC
  Filled 2021-06-12: qty 8.6, fill #0

## 2021-06-12 MED ORDER — FLUTICASONE PROPIONATE HFA 110 MCG/ACT IN AERO
2.0000 | INHALATION_SPRAY | Freq: Two times a day (BID) | RESPIRATORY_TRACT | 11 refills | Status: DC
  Filled 2021-06-12: qty 12, 30d supply, fill #0

## 2021-06-12 MED ORDER — AMOXICILLIN-POT CLAVULANATE 875-125 MG PO TABS
1.0000 | ORAL_TABLET | Freq: Two times a day (BID) | ORAL | 0 refills | Status: DC
  Filled 2021-06-12: qty 20, 10d supply, fill #0

## 2021-06-12 MED ORDER — LAMOTRIGINE 200 MG PO TABS
ORAL_TABLET | ORAL | 2 refills | Status: DC
Start: 2021-06-12 — End: 2021-06-26
  Filled 2021-06-12: qty 30, 30d supply, fill #0

## 2021-06-12 NOTE — Assessment & Plan Note (Signed)
Possibly related COVID illness in March 2020  May have sleep apnea  Plan referral to neurology for further evaluation

## 2021-06-12 NOTE — Progress Notes (Signed)
Established Patient Office Visit  Subjective:  Patient ID: [name] [name], female    DOB: Mar 07, 1986  Age: 35 y.o. MRN: [id number]  CC:  Chief Complaint  Patient presents with   Asthma    HPI [name] [name] presents for asthma follow-up. The patient states she still has episodes of coughing fits and wheezing.  She is also has periods of malaise no energy fatigue and brain fog ever since she had COVID in March 2020.  She continues to gain some weight.  There is some snoring she is never had a sleep study.  We try to get her in with gynecology she was never able to get an appointment for the IUD placement.  Now she is wanting to look at a Nexplanon implant.  We now have a program for that that is supported under patient assistance.  Patient also has spells where she her blood pressure falls when she stands from a sitting and lying position.  She is concerned about the possibility of a POTS syndrome  The patient also complains of sinus congestion postnasal drainage sinus pressure headaches.  She has had recurrent sinus infections in the past.  She did receive the flu vaccine on arrival. ening symptoms or concerns.     Past Medical History:  Diagnosis Date   Anxiety    Depression    Depression    [employer]rees[employer] 06/30/2020   GERD (gastroesophageal reflux disease)    History of kidney stones    Pneumonitis 03/02/2019   Renal disorder     Past Surgical History:  Procedure Laterality Date   CYSTOSCOPY WITH RETROGRADE PYELOGRAM, URETEROSCOPY AND STENT PLACEMENT Right 12/10/2016   Procedure: CYSTOSCOPY WITH RETROGRADE PYELOGRAM, URETEROSCOPY AND STONE REMOVAL;  Surgeon: Heloise Purpura, MD;  Location: [facility];  Service: Urology;  Laterality: Right;   WISDOM TOOTH EXTRACTION  at age 24    Family History  Problem Relation Age of Onset   Depression Mother    Anxiety disorder Mother     Social History   Socioeconomic History   Marital status: Significant Other    Spouse name: Not  on file   Number of children: Not on file   Years of education: Not on file   Highest education level: Not on file  Occupational History   Not on file  Tobacco Use   Smoking status: Never   Smokeless tobacco: Never  Vaping Use   Vaping Use: Never used  Substance and Sexual Activity   Alcohol use: Yes    Alcohol/week: 1.0 standard drink    Types: 1 Glasses of wine per week    Comment: per week   Drug use: No   Sexual activity: Yes    Birth control/protection: I.U.D.  Other Topics Concern   Not on file  Social History Narrative   Not on file   Social Determinants of Health   Financial Resource Strain: Not on file  Food Insecurity: Not on file  Transportation Needs: Not on file  Physical Activity: Not on file  Stress: Not on file  Social Connections: Not on file  Intimate Partner Violence: Not on file    Outpatient Medications Prior to Visit  Medication Sig Dispense Refill   albuterol (VENTOLIN HFA) 108 (90 Base) MCG/ACT inhaler Inhale 2 puffs into the lungs every 6 (six) hours as needed for wheezing or shortness of breath. 18 g 1   Cholecalciferol (VITAMIN D3) 50 MCG (2000 UT) TABS Take 1 oral tablet once a day 30 tablet  2   desonide (DESOWEN) 0.05 % ointment Apply 1 application topically 2 (two) times daily as needed. Eczema behind the ears 15 g 2   thiamine 100 MG tablet Take 100 mg by mouth daily.     Armodafinil 200 MG TABS Take by mouth.     budesonide (RHINOCORT AQUA) 32 MCG/ACT nasal spray Place 1 spray into both nostrils daily. 8.6 g 0   cetirizine (ZYRTEC) 10 MG tablet Take 10 mg by mouth daily.     escitalopram (LEXAPRO) 20 MG tablet Take 1 tablet by mouth every morning (Patient taking differently: Take 20 mg by mouth at bedtime.) 30 tablet 2   fluticasone (FLOVENT HFA) 110 MCG/ACT inhaler Inhale 2 puffs into the lungs 2 (two) times daily. 12 g 11   lamoTRIgine (LAMICTAL) 200 MG tablet Take 1 tablet by mouth at bedtime (if out of lamictal more than 1 wk do not  restart, call MD) 30 tablet 2   lamoTRIgine (LAMICTAL) 200 MG tablet Take 1 tablet by mouth at bedtime (if out of lamictal more than 1 wk do not restart, call MD) 30 tablet 2   montelukast (SINGULAIR) 10 MG tablet TAKE 1 TABLET BY MOUTH EVERYDAY AT BEDTIME 90 tablet 2   omeprazole (PRILOSEC) 20 MG capsule Take 1 tablet by mouth twice daily for 1 week, then decrease to 1 tablet daily 37 capsule 3   pramipexole (MIRAPEX) 1.5 MG tablet Take 1 tablet by mouth at bedtime 30 tablet 2   Cholecalciferol (VITAMIN D3) 50 MCG (2000 UT) TABS Take 1 oral tablet once a day 30 tablet 2   cloNIDine HCl (KAPVAY) 0.1 MG TB12 ER tablet Take 1 tablet by mouth every night at bedtime for 7 days, 2 tabs every night at bedtime for 7 days 60 tablet 1   escitalopram (LEXAPRO) 20 MG tablet Take 1 oral tablet every morning 30 tablet 2   escitalopram (LEXAPRO) 5 MG tablet Take 4 tabs ( ) by mouth for 1 week and then decrease by  every week as tolerated 30 tablet 1   lamoTRIgine (LAMICTAL) 200 MG tablet TAKE 1 TABLET BY MOUTH AT BEDTIME (IF OUT OF LAMICTAL MORE THAN 1 WK DO NOT RESTART, CALL MD) 30 tablet 2   lamoTRIgine (LAMICTAL) 200 MG tablet Take 1 tablet by mouth  at bedtime (if out of lamictal more than 1 wk do not restart, call MD) 30 tablet 2   lamoTRIgine (LAMICTAL) 200 MG tablet Take 1 oral tablet at bedtime (if out of lamictal more than 1 wk do not restart, call MD) 30 tablet 2   norethindrone (MICRONOR) 0.35 MG tablet TAKE 1 TABLET (0.35 MG TOTAL) BY MOUTH DAILY. 28 tablet 11   pramipexole (MIRAPEX) 1.5 MG tablet TAKE 1 TABLET BY MOUTH AT BEDTIME. 30 tablet 2   pramipexole (MIRAPEX) 1.5 MG tablet Take 1 oral tablet at bedtime 30 tablet 2   pramipexole (MIRAPEX) 1.5 MG tablet Take 1 tablet by mouth  at bedtime 30 tablet 2   No facility-administered medications prior to visit.    Allergies  Allergen Reactions   Other     Bananas, kiwi   Wellbutrin [Bupropion] Hives    ROS Review of Systems   Constitutional:  Positive for fatigue.  HENT:  Positive for rhinorrhea, sinus pressure and sinus pain. Negative for ear discharge, ear pain, postnasal drip, sore throat, trouble swallowing and voice change.   Eyes: Negative.   Respiratory: Negative.  Negative for apnea, cough, choking, chest tightness, shortness of breath,  wheezing and stridor.   Cardiovascular: Negative.  Negative for chest pain, palpitations and leg swelling.       Periods of low blood pressure standing  Gastrointestinal: Negative.  Negative for abdominal distention, abdominal pain, nausea and vomiting.  Genitourinary: Negative.   Musculoskeletal: Negative.  Negative for arthralgias, gait problem and myalgias.  Skin: Negative.  Negative for rash.  Allergic/Immunologic: Negative.  Negative for environmental allergies and food allergies.  Neurological:  Positive for dizziness, weakness, light-headedness and numbness. Negative for tremors, seizures, syncope, facial asymmetry, speech difficulty and headaches.  Hematological: Negative.  Negative for adenopathy. Does not bruise/bleed easily.  Psychiatric/Behavioral:  Positive for confusion, decreased concentration, dysphoric mood and sleep disturbance. Negative for agitation, behavioral problems, hallucinations and suicidal ideas. The patient is nervous/anxious. The patient is not hyperactive.      Objective:    Physical Exam Vitals reviewed.  Constitutional:      Appearance: Normal appearance. She is well-developed. She is obese. She is not diaphoretic.  HENT:     Head: Normocephalic and atraumatic.     Nose: No nasal deformity, septal deviation, mucosal edema or rhinorrhea.     Right Sinus: No maxillary sinus tenderness or frontal sinus tenderness.     Left Sinus: No maxillary sinus tenderness or frontal sinus tenderness.     Mouth/Throat:     Pharynx: No oropharyngeal exudate.  Eyes:     General: No scleral icterus.    Conjunctiva/sclera: Conjunctivae normal.      Pupils: Pupils are equal, round, and reactive to light.  Neck:     Thyroid: No thyromegaly.     Vascular: No carotid bruit or JVD.     Trachea: Trachea normal. No tracheal tenderness or tracheal deviation.  Cardiovascular:     Rate and Rhythm: Normal rate and regular rhythm.     Chest Wall: PMI is not displaced.     Pulses: Normal pulses. No decreased pulses.     Heart sounds: Normal heart sounds, S1 normal and S2 normal. Heart sounds not distant. No murmur heard. No systolic murmur is present.  No diastolic murmur is present.    No friction rub. No gallop. No S3 or S4 sounds.  Pulmonary:     Effort: No tachypnea, accessory muscle usage or respiratory distress.     Breath sounds: No stridor. No decreased breath sounds, wheezing, rhonchi or rales.  Chest:     Chest wall: No tenderness.  Abdominal:     General: Bowel sounds are normal. There is no distension.     Palpations: Abdomen is soft. Abdomen is not rigid.     Tenderness: There is no abdominal tenderness. There is no guarding or rebound.  Musculoskeletal:        General: Normal range of motion.     Cervical back: Normal range of motion and neck supple. No edema, erythema or rigidity. No muscular tenderness. Normal range of motion.  Lymphadenopathy:     Head:     Right side of head: No submental or submandibular adenopathy.     Left side of head: No submental or submandibular adenopathy.     Cervical: No cervical adenopathy.  Skin:    General: Skin is warm and dry.     Coloration: Skin is not pale.     Findings: No rash.     Nails: There is no clubbing.  Neurological:     Mental Status: She is alert and oriented to person, place, and time.     Sensory: No  sensory deficit.  Psychiatric:        Attention and Perception: Attention and perception normal.        Mood and Affect: Mood normal. Affect is flat.        Speech: Speech normal.        Behavior: Behavior normal. Behavior is cooperative.        Thought Content:  Thought content normal. Thought content does not include suicidal ideation.        Cognition and Memory: Cognition normal. Memory is impaired.        Judgment: Judgment normal.    BP 120/81   Pulse 100   Resp 16   LMP 05/14/2021 (Approximate)   SpO2 100%  Wt Readings from Last 3 Encounters:  05/23/21 289 lb 8 oz (131.3 kg)  01/01/21 279 lb (126.6 kg)  07/24/20 281 lb 3.2 oz (127.6 kg)     Health Maintenance Due  Topic Date Due   Pneumococcal Vaccine 64-62 Years old (2 - PCV) 04/04/2020   COVID-19 Vaccine (4 - Booster for Pfizer series) 08/15/2020   PAP SMEAR-Modifier  08/22/2020    There are no preventive care reminders to display for this patient.  Lab Results  Component Value Date   TSH 1.508 01/24/2017   Lab Results  Component Value Date   WBC 7.3 07/31/2020   HGB 13.7 07/31/2020   HCT 42.0 07/31/2020   MCV 91.1 07/31/2020   PLT 337 07/31/2020   Lab Results  Component Value Date   NA 138 07/31/2020   K 3.6 07/31/2020   CO2 23 07/31/2020   GLUCOSE 107 (H) 07/31/2020   BUN 8 07/31/2020   CREATININE 0.81 07/31/2020   BILITOT 0.4 10/15/2018   ALKPHOS 70 10/15/2018   AST 21 10/15/2018   ALT 14 10/15/2018   PROT 6.7 10/15/2018   ALBUMIN 4.0 10/15/2018   CALCIUM 9.4 07/31/2020   ANIONGAP 11 07/31/2020   Lab Results  Component Value Date   CHOL 175 01/24/2017   Lab Results  Component Value Date   HDL 43 01/24/2017   Lab Results  Component Value Date   LDLCALC 98 01/24/2017   Lab Results  Component Value Date   TRIG 171 (H) 01/24/2017   Lab Results  Component Value Date   CHOLHDL 4.1 01/24/2017   Lab Results  Component Value Date   HGBA1C 5.0 01/24/2017      Assessment & Plan:   Problem List Items Addressed This Visit       Cardiovascular and Mediastinum   POTS (postural orthostatic tachycardia syndrome)    Not clear this patient has postural orthostatic tachycardia syndrome  Plan referral to cardiology for screening        Relevant Orders   Ambulatory referral to Cardiology     Respiratory   Asthma, moderate persistent    Moderate persistent asthma currently well controlled continue Flovent for now along with Singulair      Relevant Medications   fluticasone (FLOVENT HFA) 110 MCG/ACT inhaler   montelukast (SINGULAIR) 10 MG tablet   Sinusitis, acute - Primary    Recurrent sinusitis on current exam will receive Augmentin for 10-day course.  Patient will use sinus rinse with saline and continue nasal steroid      Relevant Medications   budesonide (RHINOCORT AQUA) 32 MCG/ACT nasal spray   cetirizine (ZYRTEC) 10 MG tablet   amoxicillin-clavulanate (AUGMENTIN) 875-125 MG tablet     Other   Class 3 severe obesity due to excess calories without  serious comorbidity with body mass index (BMI) of 45.0 to 49.9 in adult [facility])    Excess weight contributing to overall fatigue issues you may have undiagnosed sleep apnea contributing to fatigue  Patient is trying to get the orange card once this is achieved we can consider a home sleep study      Brain fog    Possibly related COVID illness in March 2020  May have sleep apnea  Plan referral to neurology for further evaluation      Relevant Orders   Ambulatory referral to Neurology   Other Visit Diagnoses     Need for immunization against influenza       Relevant Orders   Flu Vaccine QUAD 94mo+IM (Fluarix, Fluzone & Alfiuria Quad PF) (Completed)   Gastroesophageal reflux disease without esophagitis       Relevant Medications   omeprazole (PRILOSEC) 20 MG capsule     Patient introduced to the mobile medicine team at this visit for potential Nexplanon placement and Pap smear at a later date  Meds ordered this encounter  Medications   budesonide (RHINOCORT AQUA) 32 MCG/ACT nasal spray    Sig: Place 1 spray into both nostrils daily.    Dispense:  8.6 g    Refill:  6    May substitute any aqueus nasal steroid prep   cetirizine (ZYRTEC) 10 MG tablet     Sig: Take 1 tablet (10 mg total) by mouth daily.    Dispense:  60 tablet    Refill:  6   escitalopram (LEXAPRO) 20 MG tablet    Sig: Take 1 tablet (20 mg total) by mouth at bedtime.    Dispense:  30 tablet    Refill:  6   fluticasone (FLOVENT HFA) 110 MCG/ACT inhaler    Sig: Inhale 2 puffs into the lungs 2 (two) times daily.    Dispense:  12 g    Refill:  11   lamoTRIgine (LAMICTAL) 200 MG tablet    Sig: Take 1 tablet by mouth at bedtime (if out of lamictal more than 1 wk do not restart, call MD)    Dispense:  30 tablet    Refill:  2    may fill as 90d if pt prefers   montelukast (SINGULAIR) 10 MG tablet    Sig: TAKE 1 TABLET BY MOUTH EVERYDAY AT BEDTIME    Dispense:  90 tablet    Refill:  2   omeprazole (PRILOSEC) 20 MG capsule    Sig: Take 1 tablet by mouth twice daily for 1 week, then decrease to 1 tablet daily    Dispense:  37 capsule    Refill:  3   pramipexole (MIRAPEX) 1.5 MG tablet    Sig: Take 1 tablet by mouth at bedtime    Dispense:  30 tablet    Refill:  2    may fill as 90 days if pt prefers   amoxicillin-clavulanate (AUGMENTIN) 875-125 MG tablet    Sig: Take 1 tablet by mouth 2 (two) times daily.    Dispense:  20 tablet    Refill:  0    Follow-up: Return in about 3 weeks (around 07/03/2021).    Shan Levans, MD

## 2021-06-12 NOTE — Assessment & Plan Note (Signed)
Excess weight contributing to overall fatigue issues you may have undiagnosed sleep apnea contributing to fatigue  Patient is trying to get the orange card once this is achieved we can consider a home sleep study

## 2021-06-12 NOTE — Assessment & Plan Note (Signed)
Not clear this patient has postural orthostatic tachycardia syndrome  Plan referral to cardiology for screening

## 2021-06-12 NOTE — Patient Instructions (Signed)
We connected you with Ms. Mayers for potential reversible contraception placement with Nexplanon in future  Referral to cardiology and neurology was made  Please process and get the orange card  Refills on all medications today  Flu vaccine was given  Augmentin 1 twice daily given for sinus infection  Continue nasal sprays and sinus rinse  Return to see Dr. Delford Field 3 weeks for recheck

## 2021-06-12 NOTE — Assessment & Plan Note (Signed)
Moderate persistent asthma currently well controlled continue Flovent for now along with Singulair

## 2021-06-12 NOTE — Assessment & Plan Note (Signed)
Recurrent sinusitis on current exam will receive Augmentin for 10-day course.  Patient will use sinus rinse with saline and continue nasal steroid

## 2021-06-19 ENCOUNTER — Other Ambulatory Visit: Payer: Self-pay

## 2021-06-26 ENCOUNTER — Ambulatory Visit
Admission: EM | Admit: 2021-06-26 | Discharge: 2021-06-26 | Disposition: A | Discharge status: Home / Self Care | Payer: Self-pay | Attending: Emergency Medicine | Admitting: Emergency Medicine

## 2021-06-26 ENCOUNTER — Other Ambulatory Visit: Payer: Self-pay

## 2021-06-26 ENCOUNTER — Telehealth: Payer: Self-pay

## 2021-06-26 DIAGNOSIS — B3731 Acute candidiasis of vulva and vagina: Secondary | ICD-10-CM

## 2021-06-26 DIAGNOSIS — K9049 Malabsorption due to intolerance, not elsewhere classified: Secondary | ICD-10-CM

## 2021-06-26 DIAGNOSIS — K219 Gastro-esophageal reflux disease without esophagitis: Secondary | ICD-10-CM

## 2021-06-26 DIAGNOSIS — R52 Pain, unspecified: Secondary | ICD-10-CM

## 2021-06-26 DIAGNOSIS — J0191 Acute recurrent sinusitis, unspecified: Secondary | ICD-10-CM

## 2021-06-26 DIAGNOSIS — J309 Allergic rhinitis, unspecified: Secondary | ICD-10-CM

## 2021-06-26 DIAGNOSIS — R509 Fever, unspecified: Secondary | ICD-10-CM

## 2021-06-26 DIAGNOSIS — Z760 Encounter for issue of repeat prescription: Secondary | ICD-10-CM

## 2021-06-26 DIAGNOSIS — J4540 Moderate persistent asthma, uncomplicated: Secondary | ICD-10-CM

## 2021-06-26 LAB — POCT INFLUENZA A/B
Influenza A, POC: NEGATIVE
Influenza B, POC: NEGATIVE

## 2021-06-26 MED ORDER — MONTELUKAST SODIUM 10 MG PO TABS: 10.0000 mg | ORAL_TABLET | Freq: Every day | ORAL | 0 refills | Status: DC

## 2021-06-26 MED ORDER — PRAMIPEXOLE DIHYDROCHLORIDE 1.5 MG PO TABS: 1.5000 mg | ORAL_TABLET | Freq: Every day | ORAL | 0 refills | Status: DC

## 2021-06-26 MED ORDER — MOMETASONE FUROATE 50 MCG/ACT NA SUSP: 2.0000 | Freq: Every day | NASAL | 12 refills | Status: DC

## 2021-06-26 MED ORDER — ESCITALOPRAM OXALATE 20 MG PO TABS: 20.0000 mg | ORAL_TABLET | Freq: Every day | ORAL | 0 refills | Status: DC

## 2021-06-26 MED ORDER — LAMOTRIGINE 200 MG PO TABS: 200.0000 mg | ORAL_TABLET | Freq: Two times a day (BID) | ORAL | 0 refills | Status: DC

## 2021-06-26 MED ORDER — ALBUTEROL SULFATE HFA 108 (90 BASE) MCG/ACT IN AERS: 2.0000 | INHALATION_SPRAY | Freq: Four times a day (QID) | RESPIRATORY_TRACT | 0 refills | Status: DC | PRN

## 2021-06-26 MED ORDER — FLUTICASONE-SALMETEROL 100-50 MCG/ACT IN AEPB: 1.0000 | INHALATION_SPRAY | Freq: Two times a day (BID) | RESPIRATORY_TRACT | 0 refills | Status: DC

## 2021-06-26 MED ORDER — ALBUTEROL SULFATE HFA 108 (90 BASE) MCG/ACT IN AERS: 2.0000 | INHALATION_SPRAY | Freq: Four times a day (QID) | RESPIRATORY_TRACT | 1 refills | Status: DC | PRN

## 2021-06-26 MED ORDER — LANSOPRAZOLE 30 MG PO CPDR
30.0000 mg | DELAYED_RELEASE_CAPSULE | Freq: Every day | ORAL | 0 refills | Status: DC
  Filled 2021-06-26 – 2021-07-13 (×2): qty 30, 30d supply, fill #0
  Filled 2021-08-07 (×2): qty 30, 30d supply, fill #1
  Filled 2021-08-10 – 2021-08-12 (×2): qty 30, 30d supply, fill #0
  Filled 2021-09-12: qty 30, 30d supply, fill #1
  Filled 2021-10-02: qty 30, 30d supply, fill #2

## 2021-06-26 MED ORDER — FLUTICASONE FUROATE-VILANTEROL 100-25 MCG/ACT IN AEPB: 1.0000 | INHALATION_SPRAY | Freq: Every day | RESPIRATORY_TRACT | 0 refills | Status: DC

## 2021-06-26 MED ORDER — FLUCONAZOLE 150 MG PO TABS: ORAL_TABLET | ORAL | 0 refills | Status: DC

## 2021-06-26 MED ORDER — IPRATROPIUM BROMIDE 0.06 % NA SOLN: 2.0000 | Freq: Four times a day (QID) | NASAL | 12 refills | Status: DC

## 2021-06-26 NOTE — ED Triage Notes (Addendum)
Pt reports she has been taking prescribed abx for a sinus infection, she reports her symptoms are still consistent. She reports having congestion, headache, sore throat, body aches and fatigue. Started: 2 days ago

## 2021-06-26 NOTE — ED Provider Notes (Signed)
[facility]    CSN: [id number] Arrival date & time: 06/26/21  [id number]    No chief complaint on file.  HPI [name] [name] is a 35 y.o. female. Patient complains of sinus infection, states she was prescribed antibiotics for this on June 12, 2021, patient states antibiotics did not help.  Patient adds that 2 days ago she began to have worsening congestion, headache, sore throat, body aches and fatigue, alternately feels hot and cold, states she feels like her extremities are always cold because he has been drawn to her core.  Patient states she has a history of asthma and allergies, currently taking cetirizine, montelukast and Rhinocort for allergies, using short acting beta agonists and inhaled corticosteroid (Flovent 110 mcg), states she is consistent with all these medications but still continues to have allergy symptoms.  Patient states she notices that she gets a rash around both naris that extends down to the corners of her mouth down to her chin, states the rash comes and goes.  Patient states she still feels like she has a runny nose all the time.  Patient also states that for the past few weeks, she has been waking up in the middle the night coughing and choking, states she gets a glass of water in her albuterol by her bed, states she drinks the water first then uses her albuterol inhaler and is able to fall back asleep.  Patient states she has a history of GERD, was previously prescribed omeprazole this but the prescription ran out.  Patient says she is currently uninsured, [facility], gets her Flovent for free.  Patient reports multiple food sensitivities as well, states she has had allergy testing and they advised her that there was nothing that needed to be treated.  Patient states she is vegan, eats a lot of soy.  Patient also requests a prescription for Diflucan, states taking Augmentin for her presumed bacterial sinusitis, which it turns  out it was not, has given her vaginal yeast infection.  The history is provided by the patient.  Past Medical History:  Diagnosis Date   Anxiety    Depression    Depression    Phreesia 06/30/2020   GERD (gastroesophageal reflux disease)    History of kidney stones    Pneumonitis 03/02/2019   Renal disorder    Patient Active Problem List   Diagnosis Date Noted   Brain fog 06/12/2021   POTS (postural orthostatic tachycardia syndrome) 06/12/2021   Sinusitis, acute 01/01/2021   Other atopic dermatitis 04/16/2020   Food allergy 01/12/2020   Class 3 severe obesity due to excess calories without serious comorbidity with body mass index (BMI) of 45.0 to 49.9 in adult [facility]) 09/06/2019   Eczema of external ear, 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    Home Medications    Prior to Admission medications   Medication Sig Start Date End Date Taking? Authorizing Provider  albuterol (VENTOLIN HFA) 108 (90 Base) MCG/ACT inhaler Inhale 2 puffs into the  lungs every 6 (six) hours as needed for wheezing or shortness of breath. 06/06/20   Storm Frisk, MD  amoxicillin-clavulanate (AUGMENTIN) 875-125 MG tablet Take 1 tablet by mouth 2 (two) times daily. 06/12/21   Storm Frisk, MD  budesonide (RHINOCORT AQUA) 32 MCG/ACT nasal spray Place 1 spray into both nostrils daily. 06/12/21   Storm Frisk, MD  cetirizine (ZYRTEC) 10 MG tablet Take 1 tablet (10 mg total) by mouth daily. 06/12/21   Storm Frisk,  MD  Cholecalciferol (VITAMIN D3) 50 MCG (2000 UT) TABS Take 1 oral tablet once a day 05/14/21     desonide (DESOWEN) 0.05 % ointment Apply 1 application topically 2 (two) times daily as needed. Eczema behind the ears 05/01/20   Ellamae Sia, DO  escitalopram (LEXAPRO) 20 MG tablet Take 1 tablet (20 mg total) by mouth at bedtime. 06/12/21   Storm Frisk, MD  fluticasone (FLOVENT HFA) 110 MCG/ACT inhaler Inhale 2 puffs into the lungs 2 (two) times daily. 06/12/21   Storm Frisk, MD  lamoTRIgine (LAMICTAL) 200 MG tablet Take 1 tablet by mouth at bedtime (if out of lamictal more than 1 wk do not restart, call MD) 06/12/21   Storm Frisk, MD  montelukast (SINGULAIR) 10 MG tablet TAKE 1 TABLET BY MOUTH EVERYDAY AT BEDTIME 06/12/21 06/12/22  Storm Frisk, MD  omeprazole (PRILOSEC) 20 MG capsule Take 1 tablet by mouth twice daily for 1 week, then decrease to 1 tablet daily 06/12/21   Storm Frisk, MD  pramipexole (MIRAPEX) 1.5 MG tablet Take 1 tablet by mouth at bedtime 06/12/21   Storm Frisk, MD  thiamine 100 MG tablet Take 100 mg by mouth daily.    [provider]   Family History Family History  Problem Relation Age of Onset   Depression Mother    Anxiety disorder Mother    Social History Social History   Tobacco Use   Smoking status: Never   Smokeless tobacco: Never  Vaping Use   Vaping Use: Never used  Substance Use Topics   Alcohol use: Yes    Alcohol/week: 1.0 standard drink    Types: 1 Glasses of wine per week    Comment: per week   Drug use: No   Allergies   Other and Wellbutrin [bupropion]  Review of Systems Review of Systems Pertinent findings noted in history of present illness.   Physical Exam Triage Vital Signs ED Triage Vitals  Enc Vitals Group     BP 05/31/21 0827 (!) 147/82     Pulse Rate 05/31/21 0827 72     Resp 05/31/21 0827 18     Temp 05/31/21 0827 98.3 F (36.8 C)     Temp Source 05/31/21 0827 Oral     SpO2 05/31/21 0827  98 %     Weight --      Height --      Head Circumference --      Peak Flow --      Pain Score 05/31/21 0826 5     Pain Loc --      Pain Edu? --      Excl. in GC? --   No data found.  Updated Vital Signs BP 125/83 (BP Location: Left Arm)   Pulse 98   Temp 99.7 F (37.6 C) (Oral)   Resp 20   LMP 06/05/2021 (Approximate)   SpO2 98%   Physical Exam Vitals and nursing note reviewed.  Constitutional:  General: She is not in acute distress.    Appearance: Normal appearance. She is obese. She is ill-appearing.  HENT:     Head: Normocephalic and atraumatic.     Salivary Glands: Right salivary gland is not diffusely enlarged or tender. Left salivary gland is not diffusely enlarged or tender.     Right Ear: Tympanic membrane, ear canal and external ear normal. No drainage. No middle ear effusion. There is no impacted cerumen. Tympanic membrane is not erythematous or bulging.     Left Ear: Tympanic membrane, ear canal and external ear normal. No drainage.  No middle ear effusion. There is no impacted cerumen. Tympanic membrane is not erythematous or bulging.     Nose: Rhinorrhea present. No nasal deformity, septal deviation, mucosal edema or congestion. Rhinorrhea is clear.     Right Turbinates: Enlarged, swollen and pale.     Left Turbinates: Enlarged, swollen and pale.     Right Sinus: Maxillary sinus tenderness and frontal sinus tenderness present.     Left Sinus: Maxillary sinus tenderness and frontal sinus tenderness present.     Mouth/Throat:     Lips: Pink. No lesions.     Mouth: Mucous membranes are moist. No oral lesions.     Pharynx: Oropharynx is clear. Uvula midline. No posterior oropharyngeal erythema or uvula swelling.     Tonsils: No tonsillar exudate. 0 on the right. 0 on the left.  Eyes:     General: Lids are normal.        Right eye: No discharge.        Left eye: No discharge.     Extraocular Movements: Extraocular movements intact.     Conjunctiva/sclera:  Conjunctivae normal.     Right eye: Right conjunctiva is not injected.     Left eye: Left conjunctiva is not injected.  Neck:     Trachea: Trachea and phonation normal.  Cardiovascular:     Rate and Rhythm: Normal rate and regular rhythm.     Pulses: Normal pulses.     Heart sounds: Normal heart sounds. No murmur heard.   No friction rub. No gallop.  Pulmonary:     Effort: Pulmonary effort is normal. No accessory muscle usage, prolonged expiration or respiratory distress.     Breath sounds: Normal breath sounds. No stridor, decreased air movement or transmitted upper airway sounds. No decreased breath sounds, wheezing, rhonchi or rales.  Chest:     Chest wall: No tenderness.  Musculoskeletal:        General: Normal range of motion.     Cervical back: Normal range of motion and neck supple. Normal range of motion.  Lymphadenopathy:     Cervical: Cervical adenopathy present.     Right cervical: Posterior cervical adenopathy present. No superficial or deep cervical adenopathy.    Left cervical: Posterior cervical adenopathy present. No superficial or deep cervical adenopathy.  Skin:    General: Skin is warm and dry.     Findings: No erythema or rash.  Neurological:     General: No focal deficit present.     Mental Status: She is alert and oriented to person, place, and time.  Psychiatric:        Mood and Affect: Mood normal.        Behavior: Behavior normal.    Visual Acuity Right Eye Distance:   Left Eye Distance:   Bilateral Distance:    Right Eye Near:   Left Eye Near:    Bilateral Near:  [facility] Couse / Diagnostics / Procedures:    EKG  Radiology No results found.  Procedures Procedures (including critical care time)  UC Diagnoses / Final Clinical Impressions(s)    Final diagnoses:  None   I have reviewed the triage vital signs and the nursing notes.  Pertinent labs & imaging results that were available during my care of the patient were reviewed by me and  considered in my medical decision making (see chart for details).    Patient presents today with symptoms consistent with viral upper respiratory infection.  Conservative care is recommended.  Rapid influenza test today was negative.  Patient currently is uninsured.  Patient was provided with coupons and refills of all of her medications to expensive pharmacy.  To maximize her benefit from allergy and asthma treatment regimens, I have given her prescriptions for mometasone nasal spray and Atrovent nasal spray, along with a prescription for a LABA/ICS.  Patient was given the website for the manufacturer of the LABA/ICS to apply for financial assistance.  I have also provided her with a prescription for Prevacid along with a coupon for that as well.  Patient advised to continue to follow-up with community care, discuss these new medications and their efficacy with them.  Patient/parent/caregiver verbalized understanding and agreement of plan as discussed.  All questions were addressed during visit.  Please see discharge instructions below for further details of plan.  ED Prescriptions   None    PDMP not reviewed this encounter.  Pending results:  Labs Reviewed - No data to display   Medications Ordered in UC: Medications - No data to display  Discharge Instructions: Discharge Instructions   None    Disposition Upon Discharge:  Patient presented with an acute illness with associated systemic symptoms and significant discomfort requiring urgent management. In my opinion, this is a condition that a prudent lay person (someone who possesses an average knowledge of health and medicine) may potentially expect to result in complications if not addressed urgently such as respiratory distress, impairment of bodily function or dysfunction of bodily organs.   Routine symptom specific, illness specific and/or disease specific instructions were discussed with the patient and/or caregiver at length.    As such, the patient has been evaluated and assessed, work-up was performed and treatment was provided in alignment with urgent care protocols and evidence based medicine.  Patient/parent/caregiver has been advised that the patient may require follow up for further testing and treatment if the symptoms continue in spite of treatment, as clinically indicated and appropriate.  The patient was tested for COVID-19, Influenza and/or RSV, then the patient/parent/guardian was advised to isolate at home pending the results of his/her diagnostic coronavirus test and potentially longer if they're positive. I have also advised pt that if his/her COVID-19 test returns positive, it's recommended to self-isolate for at least 10 days after symptoms first appeared AND until fever-free for 24 hours without fever reducer AND other symptoms have improved or resolved. Discussed self-isolation recommendations as well as instructions for household member/close contacts as per the [facility] and [place] [facility]HS, and also gave patient the [place] packet with this information.  Patient/parent/caregiver has been advised to return to the [facility] or PCP in 3-5 days if no better; to PCP or the Emergency Department if new signs and symptoms develop, or if the current signs or symptoms continue to change or worsen for further workup, evaluation and treatment as clinically indicated and appropriate  The patient will follow up with their current PCP if  and as advised. If the patient does not currently have a PCP we will assist them in obtaining one.   The patient may need specialty follow up if the symptoms continue, in spite of conservative treatment and management, for further workup, evaluation, consultation and treatment as clinically indicated and appropriate.  Condition: stable for discharge home Home: take medications as prescribed; routine discharge instructions as discussed; follow up as advised.    Lynden Oxford Scales, PA-C 06/26/21  1209

## 2021-06-26 NOTE — Telephone Encounter (Signed)
Patient called about medications, all questions answered.

## 2021-06-26 NOTE — Discharge Instructions (Addendum)
Your symptoms are most consistent with a viral upper respiratory illness.  Rapid influenza testing today was negative.    Conservative care is recommended at this time.  This includes rest, pushing clear fluids and activity as tolerated.  You may also noticed that your appetite is reduced, this is okay as long as they are drinking plenty of clear fluids.  Acetaminophen (Tylenol): This is a good fever reducer.  If there body temperature rises above 101.5 as measured with a thermometer, it is recommended that you give them 1,000 mg every 6-8 hours until they are temperature falls below 101.5, please not take more than 3,000 mg of acetaminophen either as a separate medication or as in ingredient in an over-the-counter cold/flu preparation within a 24-hour period  Ibuprofen  (Advil, Motrin): This is a good anti-inflammatory medication which addresses aches and pains and, to some degree, congestion in the nasal passages.  I recommend giving between 400 to 600 mg every 6-8 hours as needed.  Pseudoephedrine (Sudafed): This is a decongestant.  This medication has to be purchased from the pharmacist counter, I recommend giving 2 tablets, 60 mg, 2-3 times a day as needed to relieve runny nose and sinus drainage.  Guaifenesin (Robitussin, Mucinex): This is an expectorant.  This helps break up chest congestion and loosen up thick nasal drainage making phlegm and drainage more liquid and therefore easier to remove.  I recommend being 400 mg three times daily as needed.  Dextromethorphan (any cough medicine with the letters "DM" added to it's name such as Robitussin DM): This is a cough suppressant.  This is often recommended to be taken at nighttime to suppress cough and help children sleep.  Give dosage as directed on the bottle.   Chloraseptic Throat Spray: Spray 5 sprays into affected area every 2 hours, hold for 15 seconds and either swallow or spit it out.  This is a excellent numbing medication because it is  a spray, you can put it right where you needed and so sucking on a lozenge and numbing your entire mouth.  Based on my physical exam findings and the history provided  today, I do not see any evidence of bacterial infection therefore treatment with antibiotics would be of no benefit.  If you are still taking Augmentin, I recommend you discontinue it because it is not providing with any benefit.  Please follow-up within the next 3 to 5 days either with your primary care provider or urgent care if your symptoms do not resolve.  If you do not have a primary care provider, we will assist you in finding one.  I provided you with a prescription for ipratropium nasal spray and mometasone nasal spray.  These will replace your Rhinocort nasal spray.  I sent your prescription for Prevacid for your acid reflux symptoms, please take 1 tablet nightly.  I also sent a prescription for Breo which is a combination of a long-acting albuterol with an inhaled corticosteroid port.  This would replace your Flovent.  You could continue using your albuterol as needed as often as you like.  I recommend that you reach out to the below website to apply for financial aid to get this medication for free.  The meantime, I provided you with a prescription for Advair which is only a 1 month supply, the cost of this medication with the coupon should be $81.  If you choose to continue using her Flovent and albuterol while you are waiting for your application, I think this  is absolutely fine.  The financial aid application process with [employer] usually takes about 30 days.  [employer]  [employer] Patient Assistance Program   Have also renewed all of your medications with 36-month supplies and provided you with coupons at [facility].

## 2021-06-27 LAB — CBC WITH DIFFERENTIAL/PLATELET
Basophils Absolute: 0 10*3/uL (ref 0.0–0.2)
Basos: 0 %
EOS (ABSOLUTE): 0.2 10*3/uL (ref 0.0–0.4)
Eos: 2 %
Hematocrit: 38.5 % (ref 34.0–46.6)
Hemoglobin: 12.7 g/dL (ref 11.1–15.9)
Immature Grans (Abs): 0.1 10*3/uL (ref 0.0–0.1)
Immature Granulocytes: 1 %
Lymphocytes Absolute: 2 10*3/uL (ref 0.7–3.1)
Lymphs: 26 %
MCH: 29 pg (ref 26.6–33.0)
MCHC: 33 g/dL (ref 31.5–35.7)
MCV: 88 fL (ref 79–97)
Monocytes Absolute: 0.4 10*3/uL (ref 0.1–0.9)
Monocytes: 5 %
Neutrophils Absolute: 5.1 10*3/uL (ref 1.4–7.0)
Neutrophils: 66 %
Platelets: 305 10*3/uL (ref 150–450)
RBC: 4.38 x10E6/uL (ref 3.77–5.28)
RDW: 13.5 % (ref 11.7–15.4)
WBC: 7.7 10*3/uL (ref 3.4–10.8)

## 2021-06-27 LAB — SEDIMENTATION RATE: Sed Rate: 9 mm/hr (ref 0–32)

## 2021-06-30 ENCOUNTER — Other Ambulatory Visit: Payer: Self-pay

## 2021-06-30 ENCOUNTER — Ambulatory Visit (INDEPENDENT_AMBULATORY_CARE_PROVIDER_SITE_OTHER): Payer: Self-pay

## 2021-06-30 ENCOUNTER — Encounter (HOSPITAL_COMMUNITY): Payer: Self-pay | Admitting: *Deleted

## 2021-06-30 ENCOUNTER — Ambulatory Visit (HOSPITAL_COMMUNITY)
Admission: EM | Admit: 2021-06-30 | Discharge: 2021-06-30 | Disposition: A | Discharge status: Home / Self Care | Payer: Self-pay | Attending: Urgent Care | Admitting: Urgent Care

## 2021-06-30 DIAGNOSIS — J4530 Mild persistent asthma, uncomplicated: Secondary | ICD-10-CM

## 2021-06-30 DIAGNOSIS — R070 Pain in throat: Secondary | ICD-10-CM

## 2021-06-30 DIAGNOSIS — L309 Dermatitis, unspecified: Secondary | ICD-10-CM

## 2021-06-30 DIAGNOSIS — R21 Rash and other nonspecific skin eruption: Secondary | ICD-10-CM

## 2021-06-30 DIAGNOSIS — R531 Weakness: Secondary | ICD-10-CM

## 2021-06-30 DIAGNOSIS — R059 Cough, unspecified: Secondary | ICD-10-CM

## 2021-06-30 DIAGNOSIS — R053 Chronic cough: Secondary | ICD-10-CM

## 2021-06-30 DIAGNOSIS — R5383 Other fatigue: Secondary | ICD-10-CM

## 2021-06-30 LAB — POCT INFECTIOUS MONO SCREEN, ED / UC: Mono Screen: NEGATIVE

## 2021-06-30 IMAGING — DX DG CHEST 2V
2 series · 2 of 2 positions shown · non-contrast
Comparison: [DATE]

CLINICAL DATA: Persistent cough, congestion and weakness for 1 week

EXAM:
CHEST - 2 VIEW

[chest pa]
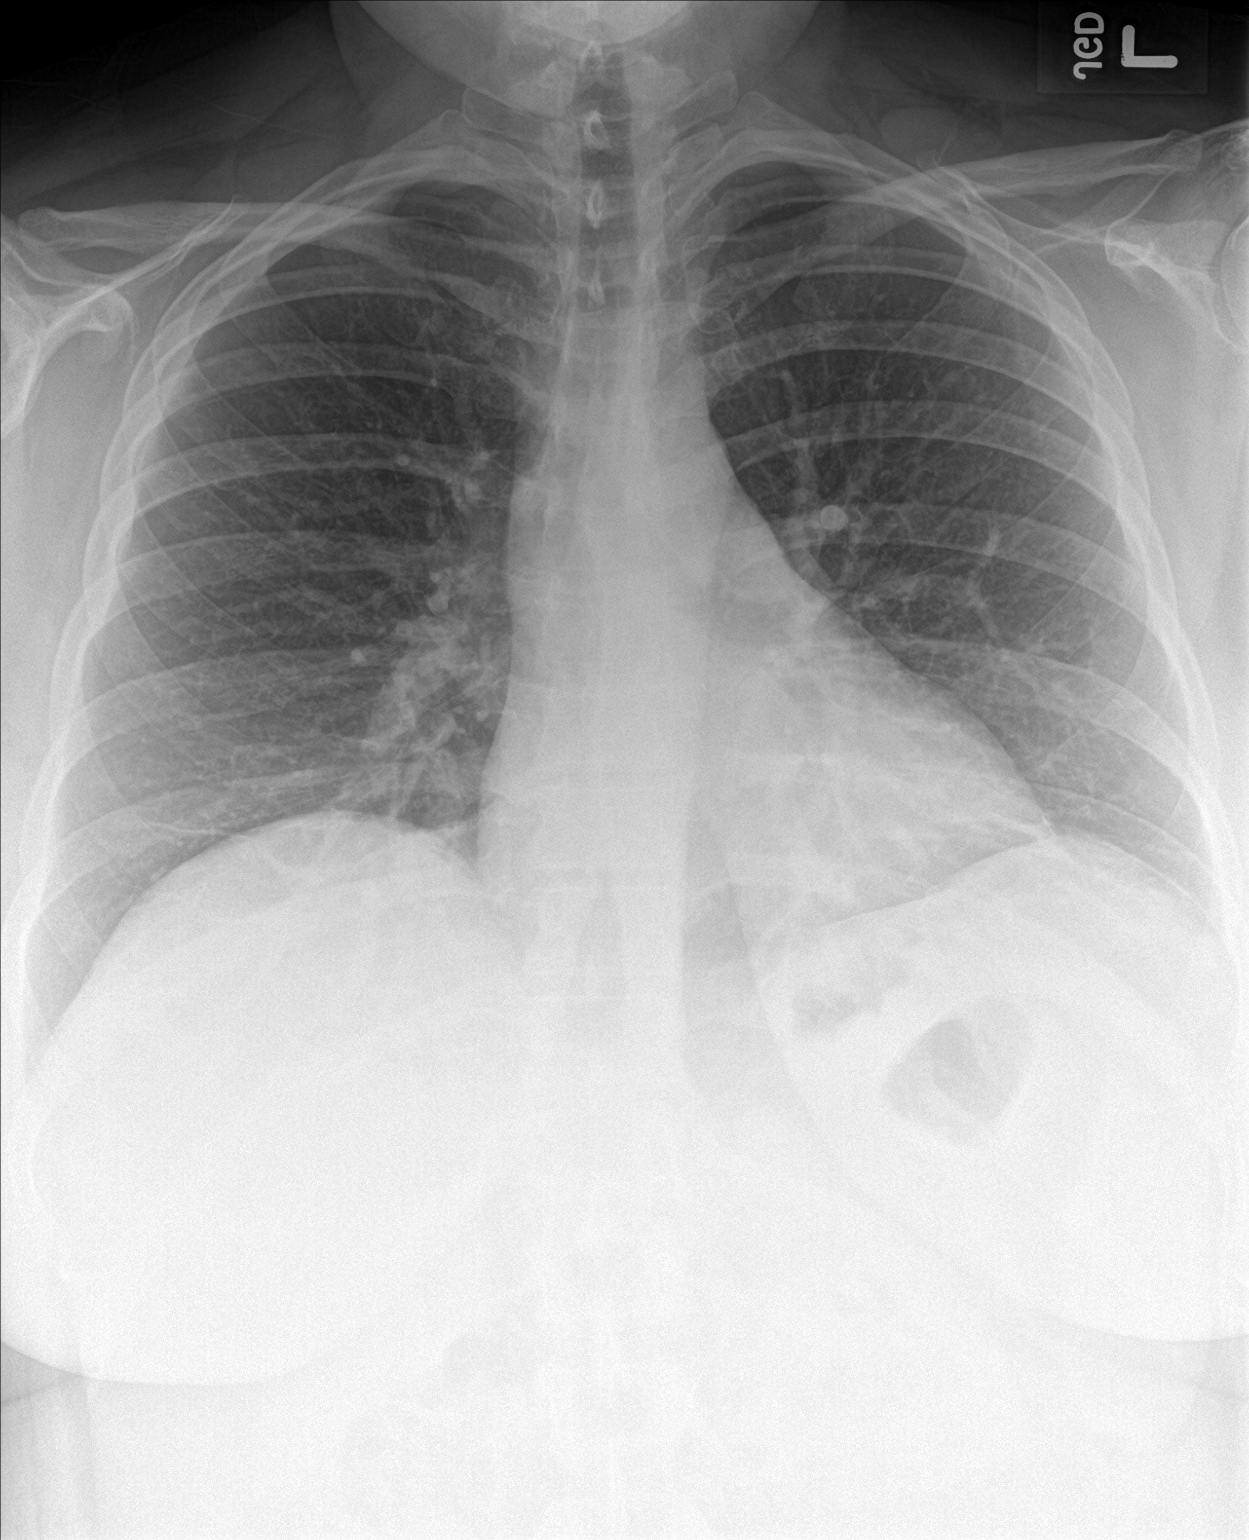

[chest lat]
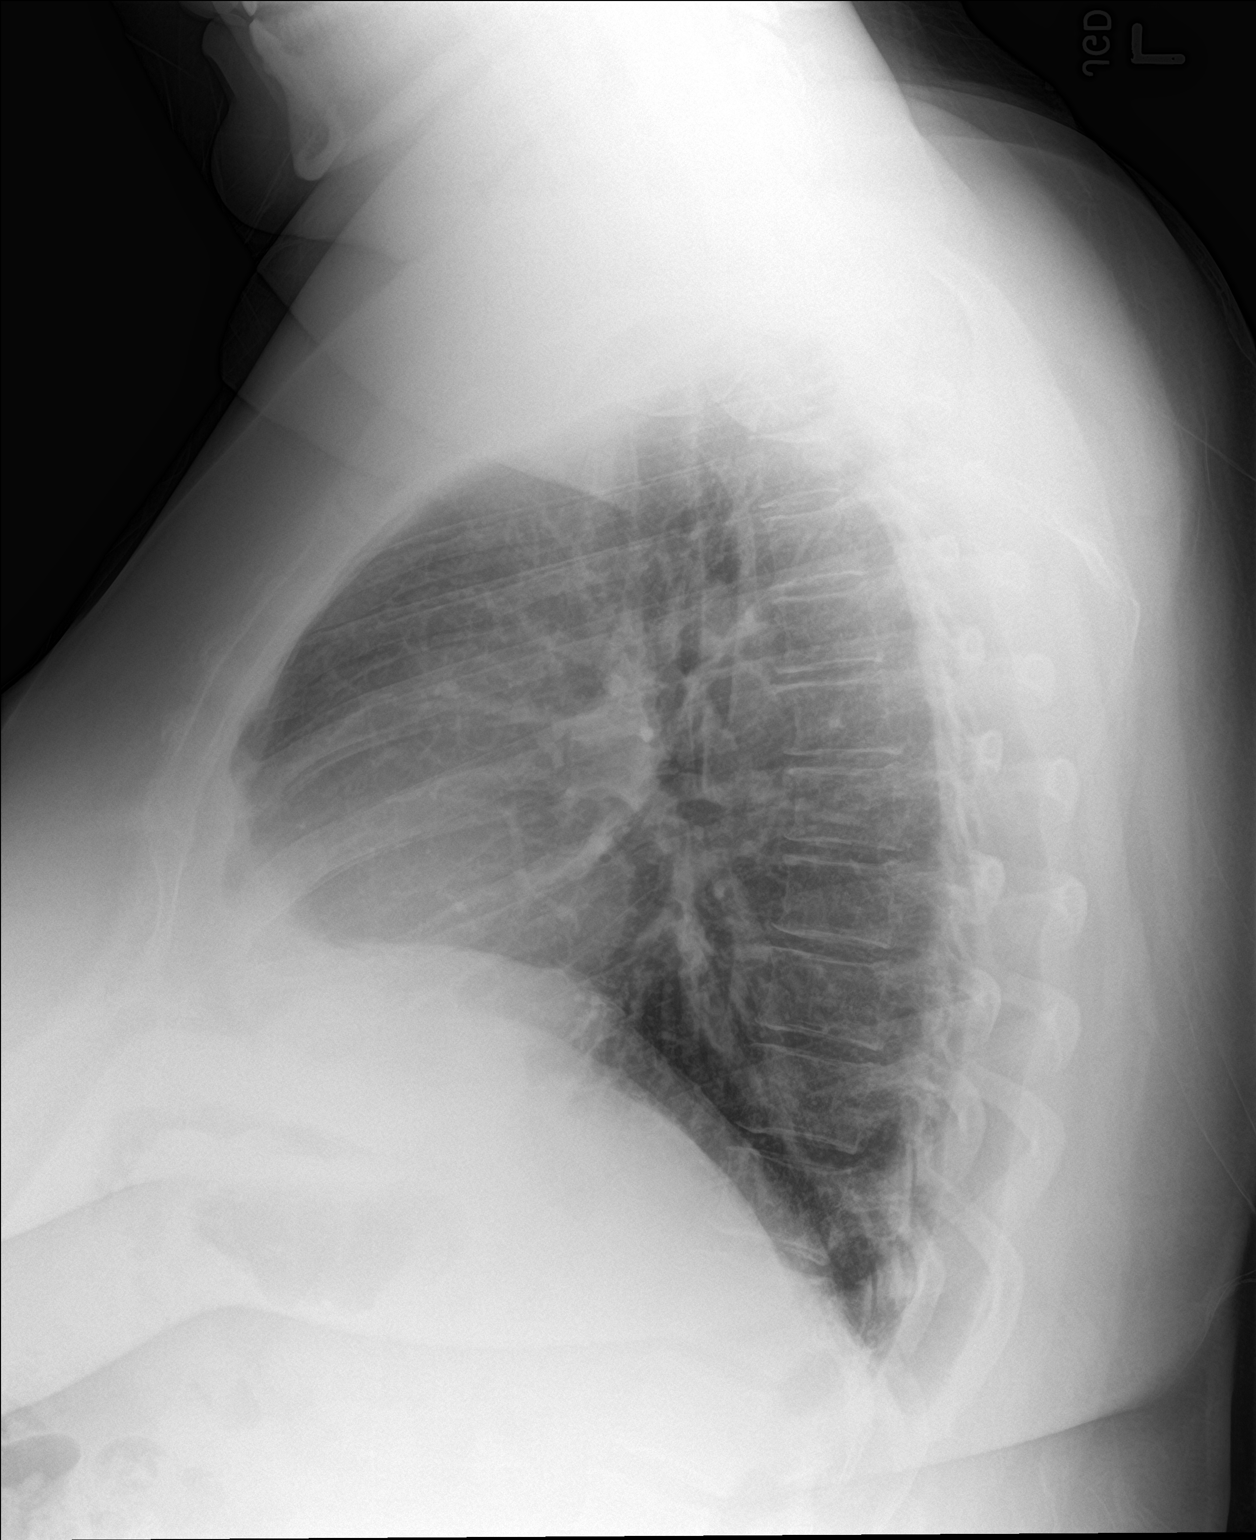

[2 of 2 positions shown; findings below may reference images not displayed]

FINDINGS: Upper normal heart size.

Mediastinal contours and pulmonary vascularity normal.

Minimal bibasilar atelectasis.

Lungs otherwise clear.

No pulmonary infiltrate, pleural effusion, or pneumothorax.

Osseous structures unremarkable.
IMPRESSION: Minimal bibasilar atelectasis.

## 2021-06-30 MED ORDER — PREDNISONE 50 MG PO TABS: 50.0000 mg | ORAL_TABLET | Freq: Every day | ORAL | 0 refills | Status: DC

## 2021-06-30 MED ORDER — BENZONATATE 100 MG PO CAPS: 100.0000 mg | ORAL_CAPSULE | Freq: Three times a day (TID) | ORAL | 0 refills | Status: DC | PRN

## 2021-06-30 MED ORDER — PROMETHAZINE-DM 6.25-15 MG/5ML PO SYRP: 5.0000 mL | ORAL_SOLUTION | Freq: Every evening | ORAL | 0 refills | Status: DC | PRN

## 2021-06-30 NOTE — ED Provider Notes (Signed)
[name]   MRN: [id number] DOB: [birth year]  Subjective:   [name] [name] is a 35 y.o. female presenting for 10 day history of significant fatigue, shob, sinus congestion, rash over her anterior neck that was very itchy. Has a history of eczema, is experiencing a flare up. Had a script for Augmentin on 06/12/2021, completed the entire course for that. Has a history of asthma, is coughing persistently at night.   No current facility-administered medications for this encounter.  Current Outpatient Medications:    albuterol (VENTOLIN HFA) 108 (90 Base) MCG/ACT inhaler, Inhale 2 puffs into the lungs every 6 (six) hours as needed for wheezing or shortness of breath (Cough)., Disp: 18 g, Rfl: 0   albuterol (VENTOLIN HFA) 108 (90 Base) MCG/ACT inhaler, Inhale 2 puffs into the lungs every 6 (six) hours as needed for wheezing or shortness of breath., Disp: 18 g, Rfl: 1   amoxicillin-clavulanate (AUGMENTIN) 875-125 MG tablet, Take 1 tablet by mouth 2 (two) times daily., Disp: 20 tablet, Rfl: 0   budesonide (RHINOCORT AQUA) 32 MCG/ACT nasal spray, Place 1 spray into both nostrils daily., Disp: 8.6 g, Rfl: 6   cetirizine (ZYRTEC) 10 MG tablet, Take 1 tablet (10 mg total) by mouth daily., Disp: 60 tablet, Rfl: 6   Cholecalciferol (VITAMIN D3) 50 MCG (2000 UT) TABS, Take 1 oral tablet once a day, Disp: 30 tablet, Rfl: 2   desonide (DESOWEN) 0.05 % ointment, Apply 1 application topically 2 (two) times daily as needed. Eczema behind the ears, Disp: 15 g, Rfl: 2   escitalopram (LEXAPRO) 20 MG tablet, Take 1 tablet (20 mg total) by mouth at bedtime., Disp: 180 tablet, Rfl: 0   fluconazole (DIFLUCAN) 150 MG tablet, Take 1 tablet today.  Take second tablet 3 days later., Disp: 2 tablet, Rfl: 0   fluticasone (FLOVENT HFA) 110 MCG/ACT inhaler, Inhale 2 puffs into the lungs 2 (two) times daily., Disp: 12 g, Rfl: 11   fluticasone furoate-vilanterol (BREO ELLIPTA) 100-25 MCG/ACT AEPB, Inhale 1  puff into the lungs daily., Disp: 180 each, Rfl: 0   fluticasone-salmeterol (ADVAIR DISKUS) 100-50 MCG/ACT AEPB, Inhale 1 puff into the lungs 2 (two) times daily., Disp: 1 each, Rfl: 0   ipratropium (ATROVENT) 0.06 % nasal spray, Place 2 sprays into both nostrils 4 (four) times daily. As needed for nasal congestion, runny nose, Disp: 15 mL, Rfl: 12   lamoTRIgine (LAMICTAL) 200 MG tablet, Take 1 tablet (200 mg total) by mouth 2 (two) times daily., Disp: 360 tablet, Rfl: 0   lansoprazole (PREVACID) 30 MG capsule, Take 1 capsule (30 mg total) by mouth daily at 12 noon., Disp: 180 capsule, Rfl: 0   mometasone (NASONEX) 50 MCG/ACT nasal spray, Place 2 sprays into the nose daily., Disp: 1 each, Rfl: 12   montelukast (SINGULAIR) 10 MG tablet, Take 1 tablet (10 mg total) by mouth at bedtime., Disp: 180 tablet, Rfl: 0   pramipexole (MIRAPEX) 1.5 MG tablet, Take 1 tablet (1.5 mg total) by mouth at bedtime., Disp: 180 tablet, Rfl: 0   thiamine 100 MG tablet, Take 100 mg by mouth daily., Disp: , Rfl:    Allergies  Allergen Reactions   Other     Bananas, kiwi   Wellbutrin [Bupropion] Hives    Past Medical History:  Diagnosis Date   Anxiety    Depression    Depression    Phreesia 06/30/2020   GERD (gastroesophageal reflux disease)    History of kidney stones  Pneumonitis 03/02/2019   Renal disorder      Past Surgical History:  Procedure Laterality Date   CYSTOSCOPY WITH RETROGRADE PYELOGRAM, URETEROSCOPY AND STENT PLACEMENT Right 12/10/2016   Procedure: CYSTOSCOPY WITH RETROGRADE PYELOGRAM, URETEROSCOPY AND STONE REMOVAL;  Surgeon: Raynelle Bring, MD;  Location: [facility];  Service: Urology;  Laterality: Right;   WISDOM TOOTH EXTRACTION  at age 77    Family History  Problem Relation Age of Onset   Depression Mother    Anxiety disorder Mother     Social History   Tobacco Use   Smoking status: Never   Smokeless tobacco: Never  Vaping Use   Vaping Use: Never used  Substance Use Topics    Alcohol use: Yes    Alcohol/week: 1.0 standard drink    Types: 1 Glasses of wine per week    Comment: per week   Drug use: No    ROS   Objective:   Vitals: BP 120/82   Pulse 92   Temp 98.3 F (36.8 C)   Resp 20   LMP 06/05/2021   SpO2 100%   Physical Exam Constitutional:      General: She is not in acute distress.    Appearance: Normal appearance. She is well-developed. She is obese. She is not ill-appearing, toxic-appearing or diaphoretic.  HENT:     Head: Normocephalic and atraumatic.     Right Ear: External ear normal.     Left Ear: External ear normal.     Nose: Nose normal.     Mouth/Throat:     Mouth: Mucous membranes are moist.  Eyes:     Extraocular Movements: Extraocular movements intact.     Pupils: Pupils are equal, round, and reactive to light.  Cardiovascular:     Rate and Rhythm: Normal rate and regular rhythm.     Pulses: Normal pulses.     Heart sounds: Normal heart sounds. No murmur heard.   No friction rub. No gallop.  Pulmonary:     Effort: Pulmonary effort is normal. No respiratory distress.     Breath sounds: Normal breath sounds. No stridor. No wheezing, rhonchi or rales.  Skin:    General: Skin is warm and dry.     Findings: No rash.  Neurological:     Mental Status: She is alert and oriented to person, place, and time.  Psychiatric:        Mood and Affect: Mood normal.        Behavior: Behavior normal.        Thought Content: Thought content normal.    Results for orders placed or performed during the hospital encounter of 06/30/21 (from the past 24 hour(s))  POCT Infectious Mono Screen     Status: None   Collection Time: 06/30/21  1:00 PM  Result Value Ref Range   Mono Screen NEGATIVE NEGATIVE   DG Chest 2 View  Result Date: 06/30/2021 CLINICAL DATA:  Persistent cough, congestion and weakness for 1 week EXAM: CHEST - 2 VIEW COMPARISON:  07/31/2020 FINDINGS: Upper normal heart size. Mediastinal contours and pulmonary vascularity  normal. Minimal bibasilar atelectasis. Lungs otherwise clear. No pulmonary infiltrate, pleural effusion, or pneumothorax. Osseous structures unremarkable. IMPRESSION: Minimal bibasilar atelectasis. Electronically Signed   By: Lavonia Dana M.D.   On: 06/30/2021 13:38    Recent Results (from the past 2160 hour(s))  POCT Influenza A/B     Status: None   Collection Time: 06/26/21 10:32 AM  Result Value Ref Range   Influenza A,  POC Negative Negative   Influenza B, POC Negative Negative  CBC with Differential/Platelet     Status: None   Collection Time: 06/26/21 10:51 AM  Result Value Ref Range   WBC 7.7 3.4 - 10.8 x10E3/uL   RBC 4.38 3.77 - 5.28 x10E6/uL   Hemoglobin 12.7 11.1 - 15.9 g/dL   Hematocrit 38.5 34.0 - 46.6 %   MCV 88 79 - 97 fL   MCH 29.0 26.6 - 33.0 pg   MCHC 33.0 31.5 - 35.7 g/dL   RDW 13.5 11.7 - 15.4 %   Platelets 305 150 - 450 x10E3/uL   Neutrophils 66 Not Estab. %   Lymphs 26 Not Estab. %   Monocytes 5 Not Estab. %   Eos 2 Not Estab. %   Basos 0 Not Estab. %   Neutrophils Absolute 5.1 1.4 - 7.0 x10E3/uL   Lymphocytes Absolute 2.0 0.7 - 3.1 x10E3/uL   Monocytes Absolute 0.4 0.1 - 0.9 x10E3/uL   EOS (ABSOLUTE) 0.2 0.0 - 0.4 x10E3/uL   Basophils Absolute 0.0 0.0 - 0.2 x10E3/uL   Immature Granulocytes 1 Not Estab. %   Immature Grans (Abs) 0.1 0.0 - 0.1 x10E3/uL  Sedimentation rate     Status: None   Collection Time: 06/26/21 10:51 AM  Result Value Ref Range   Sed Rate 9 0 - 32 mm/hr  POCT Infectious Mono Screen     Status: None   Collection Time: 06/30/21  1:00 PM  Result Value Ref Range   Mono Screen NEGATIVE NEGATIVE     Assessment and Plan :   PDMP not reviewed this encounter.  1. Weakness   2. Other fatigue   3. Persistent cough   4. Mild persistent asthma without complication   5. Throat pain   6. Eczema, unspecified type   7. Rash and nonspecific skin eruption     We will use an oral prednisone course for treatment of her asthma, eczema and  allergic rhinitis.  She is already undergone a course of Augmentin.  Her work-up has been reassuring.  She has hemodynamically stable vital signs.  Negative chest x-ray.  Recommended supportive care otherwise. Counseled patient on potential for adverse effects with medications prescribed/recommended today, ER and return-to-clinic precautions discussed, patient verbalized understanding.    Jaynee Eagles, PA-C 06/30/21 1345

## 2021-06-30 NOTE — ED Triage Notes (Signed)
Pt seen on 11-23 for same fatigue and URI infections. Pt returns because Sx's have not improved.

## 2021-07-01 ENCOUNTER — Ambulatory Visit: Payer: Self-pay | Admitting: *Deleted

## 2021-07-01 ENCOUNTER — Emergency Department (HOSPITAL_COMMUNITY): Payer: Self-pay

## 2021-07-01 ENCOUNTER — Emergency Department (HOSPITAL_COMMUNITY)
Admission: EM | Admit: 2021-07-01 | Discharge: 2021-07-02 | Disposition: A | Discharge status: Home / Self Care | Payer: Self-pay | Attending: Emergency Medicine | Admitting: Emergency Medicine

## 2021-07-01 ENCOUNTER — Other Ambulatory Visit: Payer: Self-pay

## 2021-07-01 ENCOUNTER — Encounter (HOSPITAL_COMMUNITY): Payer: Self-pay | Admitting: *Deleted

## 2021-07-01 DIAGNOSIS — R531 Weakness: Secondary | ICD-10-CM | POA: Insufficient documentation

## 2021-07-01 DIAGNOSIS — J4540 Moderate persistent asthma, uncomplicated: Secondary | ICD-10-CM | POA: Insufficient documentation

## 2021-07-01 DIAGNOSIS — Z7951 Long term (current) use of inhaled steroids: Secondary | ICD-10-CM | POA: Insufficient documentation

## 2021-07-01 DIAGNOSIS — Z20822 Contact with and (suspected) exposure to covid-19: Secondary | ICD-10-CM | POA: Insufficient documentation

## 2021-07-01 LAB — COMPREHENSIVE METABOLIC PANEL
ALT: 17 U/L (ref 0–44)
AST: 20 U/L (ref 15–41)
Albumin: 3.5 g/dL (ref 3.5–5.0)
Alkaline Phosphatase: 82 U/L (ref 38–126)
Anion gap: 10 (ref 5–15)
BUN: 10 mg/dL (ref 6–20)
CO2: 20 mmol/L — ABNORMAL LOW (ref 22–32)
Calcium: 8.9 mg/dL (ref 8.9–10.3)
Chloride: 106 mmol/L (ref 98–111)
Creatinine, Ser: 0.69 mg/dL (ref 0.44–1.00)
GFR, Estimated: >60 mL/min (ref >60)
Glucose, Bld: 183 mg/dL — ABNORMAL HIGH (ref 70–99)
Potassium: 4.6 mmol/L (ref 3.5–5.1)
Sodium: 136 mmol/L (ref 135–145)
Total Bilirubin: 0.5 mg/dL (ref 0.3–1.2)
Total Protein: 6.7 g/dL (ref 6.5–8.1)

## 2021-07-01 LAB — CBC WITH DIFFERENTIAL/PLATELET
Abs Immature Granulocytes: 0.14 10*3/uL — ABNORMAL HIGH (ref 0.00–0.07)
Basophils Absolute: 0 10*3/uL (ref 0.0–0.1)
Basophils Relative: 0 %
Eosinophils Absolute: 0 10*3/uL (ref 0.0–0.5)
Eosinophils Relative: 0 %
HCT: 41.6 % (ref 36.0–46.0)
Hemoglobin: 13.5 g/dL (ref 12.0–15.0)
Immature Granulocytes: 1 %
Lymphocytes Relative: 10 %
Lymphs Abs: 1 10*3/uL (ref 0.7–4.0)
MCH: 29.4 pg (ref 26.0–34.0)
MCHC: 32.5 g/dL (ref 30.0–36.0)
MCV: 90.6 fL (ref 80.0–100.0)
Monocytes Absolute: 0.1 10*3/uL (ref 0.1–1.0)
Monocytes Relative: 1 %
Neutro Abs: 9 10*3/uL — ABNORMAL HIGH (ref 1.7–7.7)
Neutrophils Relative %: 88 %
Platelets: 360 10*3/uL (ref 150–400)
RBC: 4.59 MIL/uL (ref 3.87–5.11)
RDW: 14.1 % (ref 11.5–15.5)
WBC: 10.3 10*3/uL (ref 4.0–10.5)
nRBC: 0 % (ref 0.0–0.2)

## 2021-07-01 LAB — URINALYSIS, ROUTINE W REFLEX MICROSCOPIC
Bilirubin Urine: NEGATIVE
Glucose, UA: NEGATIVE mg/dL
Hgb urine dipstick: NEGATIVE
Ketones, ur: NEGATIVE mg/dL
Leukocytes,Ua: NEGATIVE
Nitrite: NEGATIVE
Protein, ur: NEGATIVE mg/dL
Specific Gravity, Urine: 1.016 (ref 1.005–1.030)
pH: 7 (ref 5.0–8.0)

## 2021-07-01 LAB — MAGNESIUM: Magnesium: 2.2 mg/dL (ref 1.7–2.4)

## 2021-07-01 LAB — VITAMIN B12: Vitamin B-12: 360 pg/mL (ref 180–914)

## 2021-07-01 LAB — CK: Total CK: 47 U/L (ref 38–234)

## 2021-07-01 LAB — TSH: TSH: 0.932 u[IU]/mL (ref 0.350–4.500)

## 2021-07-01 MED ORDER — LORAZEPAM 2 MG/ML IJ SOLN
INTRAMUSCULAR | Status: AC
  Filled 2021-07-01: qty 1

## 2021-07-01 MED ORDER — LORAZEPAM 2 MG/ML IJ SOLN
1.0000 mg | Freq: Once | INTRAMUSCULAR | Status: AC | PRN
  Administered 2021-07-02: 1 mg via INTRAVENOUS

## 2021-07-01 NOTE — Telephone Encounter (Signed)
I am booked out this week as I am not in clinic wed.  Perhaps add on to [employer] schedule if she is available  make sure pt has been covid tested

## 2021-07-01 NOTE — ED Notes (Signed)
Pt next on MRI list

## 2021-07-01 NOTE — Telephone Encounter (Signed)
Pt reports generalized weakness, onset 2 days ago. Reports went to [facility] yesterday, tested for MOno, negative. States prescribed prednisone for asthma, "Not taking because I'm bipolar and I don't think that's what's wrong with me."  Reports fever off and on, max 100.0. Denies cough. States "Having a hard time getting around, have to use a walker someone gave me." Denies dizziness, just weak. Increased fatigue, SOB at times with exertion only. Also reports body aches. States  asthma meds changed 06/26/21 at [facility] visit. No other med changes. No availability at practice. Pt states will go to ED if Dr. Delford Field advises. "Maybe he could just do some testing." Assured pt NT would route to practice for PCPs review and final disposition. Advised ED for worsening symptoms.   Please advise: [phone]   Reason for Disposition  [1] MODERATE weakness (i.e., interferes with work, school, normal activities) AND [2] cause unknown  (Exceptions: weakness with acute minor illness, or weakness from poor fluid intake)  Answer Assessment - Initial Assessment Questions 1. DESCRIPTION: "Describe how you are feeling."     Generalized weakness 2. SEVERITY: "How bad is it?"  "Can you stand and walk?"   - MILD - Feels weak or tired, but does not interfere with work, school or normal activities   - MODERATE - Able to stand and walk; weakness interferes with work, school, or normal activities   - SEVERE - Unable to stand or walk     Using walker 3. ONSET:  "When did the weakness begin?"     2 days ago 4. CAUSE: "What do you think is causing the weakness?"     Unsure 5. MEDICINES: "Have you recently started a new medicine or had a change in the amount of a medicine?"     Went to [facility] 06/26/21. Asthma meds changed, ATB for 6. OTHER SYMPTOMS: "Do you have any other symptoms?" (e.g., chest pain, fever, cough, SOB, vomiting, diarrhea, bleeding, other areas of pain)     Unsteady on feet with weakness, using walker. Asthma attack last  night. No cough  Protocols used: Weakness (Generalized) and Fatigue-A-AH

## 2021-07-01 NOTE — ED Provider Notes (Signed)
Emergency Medicine Provider Triage Evaluation Note  [name] [name] , a 35 y.o. female  was evaluated in triage.  Pt complains of weakness x 3 days.   Patient reports she has been feeling poorly over the past few weeks, initially with congestion/sinus headache problems- fairly improved S/p augmentin started 11/09, subsequently developed cough and trouble breathing over the past week, and then developed low grade fevers, neck rash, and weakness 3 days prior. She is primarily concerned regarding her significant weakness to her bilateral upper/lower extremities, most notably in her legs in the quad area, she has trouble getting up and ambulating, her legs feel heavy, and this worsens the more she tries to perform activities. Weakness did not have an ascending pattern. She denies back pain, visual disturbance, syncope, speech changes, numbness, tingling, saddle anesthesia, incontinence to bowel/bladder, or IV drug use. Seen @ [facility] yesterday- had CXR (minimal bibasilar atelectasis), normal sed rate, normal cbc, and negative POC flu/mono testing.    Review of Systems  Per HPI.   Physical Exam  BP (!) 118/92   Pulse 91   Temp 99.5 F (37.5 C) (Oral)   Resp 12   LMP 06/05/2021   SpO2 99%  Gen:   Awake, no distress   Resp:  Normal effort  Skin:   Rash to the neck more so right side and anteriorly.  Neuro:   CN III-XII grossly intact. Sensation grossly intact to bilateral upper/lower extremities. Symmetric intact strength with elbow flexion/extension, grip strength, knee flexion/extension, and ankle plantar/dorsiflexion. 2+ symmetric patellar DTRs. Patient with difficulty ambulating, able to take a few steps, but feels her knees will buckle.   Medical Decision Making  Medically screening exam initiated at 10:20 PM.  Appropriate orders placed.  [name] A Shed was informed that the remainder of the evaluation will be completed by another provider, this initial triage assessment does not replace that  evaluation, and the importance of remaining in the ED until their evaluation is complete.  Labs & EKG ordered.   Given patient's presentation discussed with neurologist Dr. Wilford Corner- recommends MRI brain & C spine w/wo contrast.      Cherly Anderson, PA-C 07/01/21 2248    Horton, Clabe Seal, DO 07/01/21 2332

## 2021-07-01 NOTE — ED Provider Notes (Incomplete)
Emergency Medicine Provider Triage Evaluation Note  [name] [name] , a 35 y.o. female  was evaluated in triage.  Pt complains of weakness.   Patient has been feeling poorly for the past few weeks .  Review of Systems  Positive: *** Negative: ***  Physical Exam  BP (!) 118/92    Pulse 91    Temp 99.5 F (37.5 C) (Oral)    Resp 12    LMP 06/05/2021    SpO2 99%  Gen:   Awake, no distress  *** Resp:  Normal effort *** MSK:   Moves extremities without difficulty *** Other:  ***  Medical Decision Making  Medically screening exam initiated at 10:24 PM.  Appropriate orders placed.  [name] [name] was informed that the remainder of the evaluation will be completed by another provider, this initial triage assessment does not replace that evaluation, and the importance of remaining in the ED until their evaluation is complete.  ***

## 2021-07-01 NOTE — ED Triage Notes (Signed)
Pt has been having weakness in her legs for the past three days with difficulty walking. Also having chest pain, sob, fatigue, headache, and rash to the R side of her neck.

## 2021-07-02 ENCOUNTER — Encounter (HOSPITAL_COMMUNITY): Payer: Self-pay | Admitting: Internal Medicine

## 2021-07-02 DIAGNOSIS — R0682 Tachypnea, not elsewhere classified: Secondary | ICD-10-CM

## 2021-07-02 DIAGNOSIS — R531 Weakness: Secondary | ICD-10-CM

## 2021-07-02 DIAGNOSIS — M791 Myalgia, unspecified site: Secondary | ICD-10-CM

## 2021-07-02 LAB — I-STAT BETA HCG BLOOD, ED (MC, WL, AP ONLY): I-stat hCG, quantitative: 8.1 m[IU]/mL — ABNORMAL HIGH (ref <5)

## 2021-07-02 LAB — RESP PANEL BY RT-PCR (FLU A&B, COVID) ARPGX2
Influenza A by PCR: NEGATIVE
Influenza B by PCR: NEGATIVE
SARS Coronavirus 2 by RT PCR: NEGATIVE

## 2021-07-02 LAB — HCG, QUANTITATIVE, PREGNANCY: hCG, Beta Chain, Quant, S: 1 m[IU]/mL (ref <5)

## 2021-07-02 IMAGING — MR MR HEAD WO/W CM
17 of 19 series · 33 of 48 positions shown · IV contrast (gadavist)
Comparison: None.

CLINICAL DATA: Demyelinating disease

EXAM:
MRI HEAD WITHOUT AND WITH CONTRAST
TECHNIQUE: Multiplanar, multiecho pulse sequences of the brain and surrounding
structures were obtained without and with intravenous contrast.
CONTRAST:  9mL GADAVIST GADOBUTROL 1 MMOL/ML IV SOLN

[Series 6: DWI · axial · 3.0mm · 0.88mm/px · z∈[-76,+89]mm · 3 of 112 slices shown (1 of 4)]
[im 1/112]
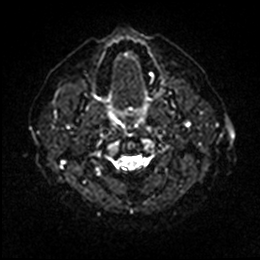
[im 56/112]
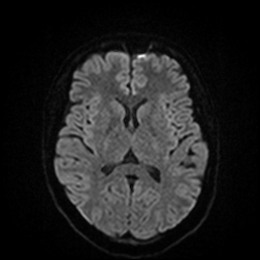
[im 112/112]
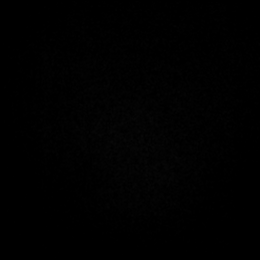

[Series 7: DWI · axial · 3.0mm · 0.88mm/px · 1 of 55 slices shown (2 of 4)]
[im 1/55]
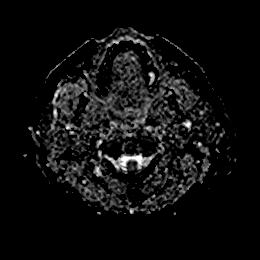

[Series 8: DWI · coronal · 4.0mm · 0.88mm/px · 3 of 80 slices shown (3 of 4)]
[im 1/80]
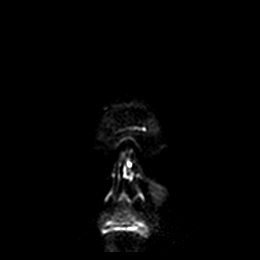
[im 40/80]
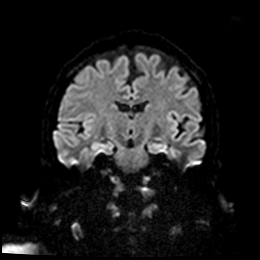
[im 80/80]
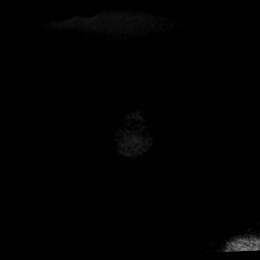

[Series 9: DWI · coronal · 4.0mm · 0.88mm/px · 1 of 40 slices shown (4 of 4)]
[im 1/40]
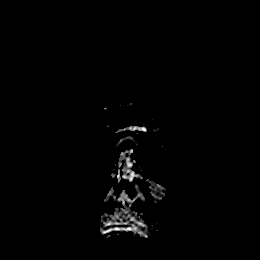

[Series 10: T1 · sagittal · 5.0mm · 0.75mm/px · 1 of 29 slices shown]
[im 1/29]
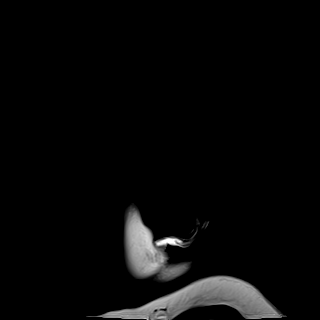

[Series 11: FLAIR · axial · 3.0mm · 0.45mm/px · 1 of 46 slices shown]
[im 1/46]
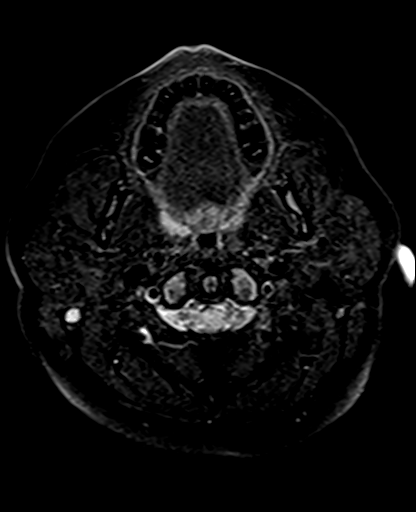

[Series 12: T2 · axial · 4.0mm · 0.72mm/px · 1 of 35 slices shown]
[im 1/35]
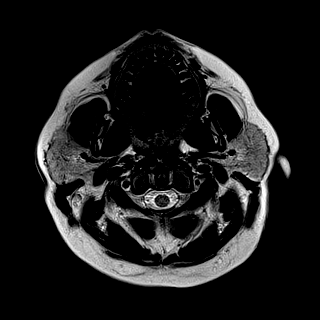

[Series 13: mag_images · axial · 3.0mm · 0.90mm/px · z∈[-76,+89]mm · 2 of 56 slices shown]
[im 1/56]
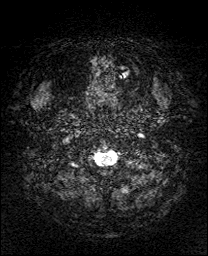
[im 56/56]
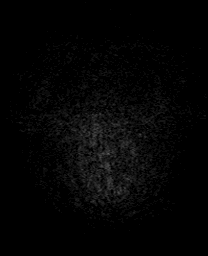

[Series 14: pha_images · axial · 3.0mm · 0.90mm/px · z∈[-76,+80]mm · 2 of 53 slices shown]
[im 1/53]
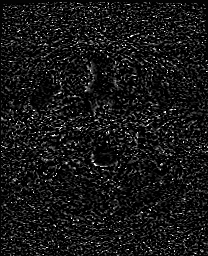
[im 53/53]
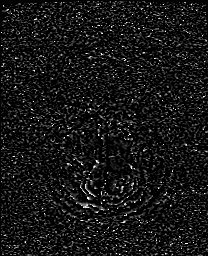

[Series 15: swi_images · axial · 3.0mm · 0.90mm/px · z∈[-76,+89]mm · 2 of 56 slices shown]
[im 1/56]
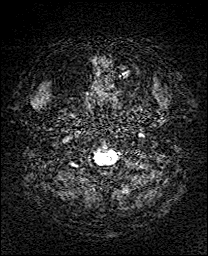
[im 56/56]
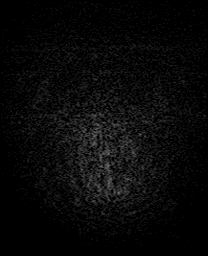

[Series 16: mip_images(sw) · axial · 24.0mm · 0.90mm/px · z∈[-65,+79]mm · 2 of 49 slices shown]
[im 1/49]
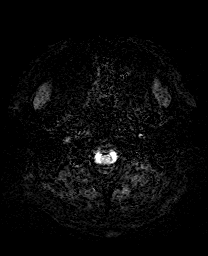
[im 49/49]
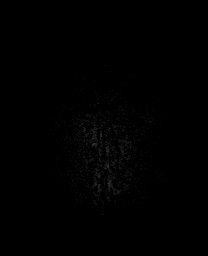

[Series 17: t2_space_dark-fluid_sag_p2_ns-ir · sagittal · 1.0mm · 0.49mm/px · 6 of 192 slices shown]
[im 1/192]
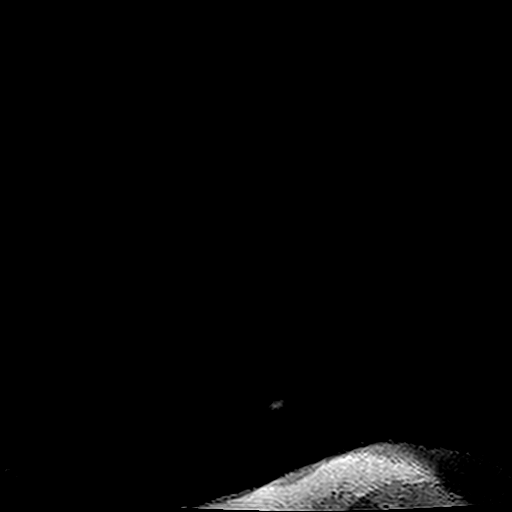
[im 39/192]
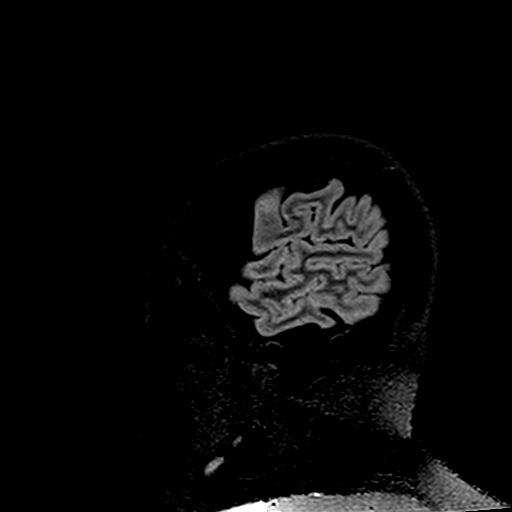
[im 77/192]
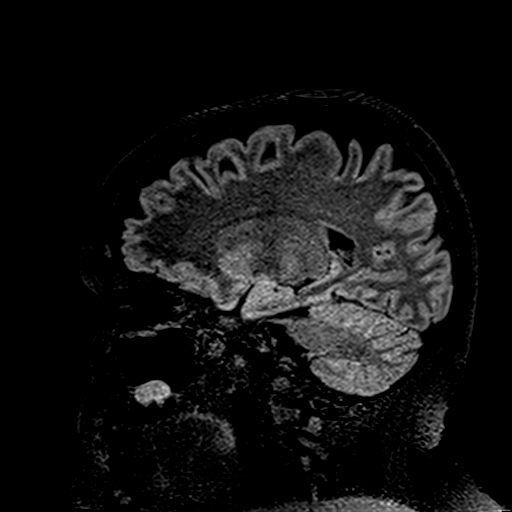
[im 115/192]
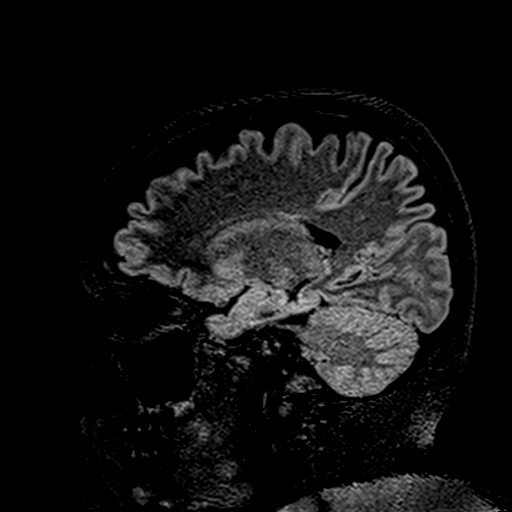
[im 153/192]
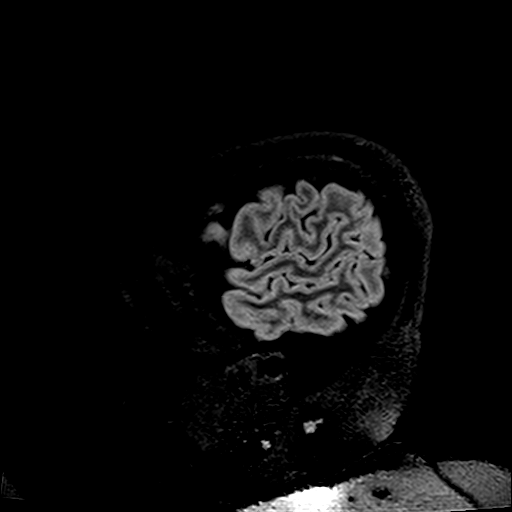
[im 192/192]
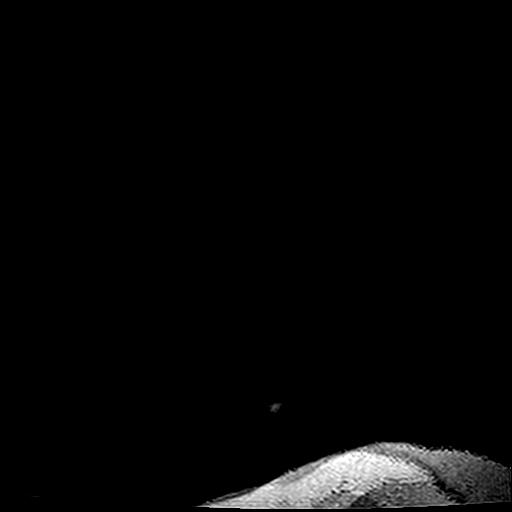

[Series 18: t2_space_dark-fluid_sag_p2_ns-ir_mpr_ axial · axial · 1.0mm · 0.45mm/px · z∈[-62,+63]mm · 4 of 126 slices shown]
[im 1/126]
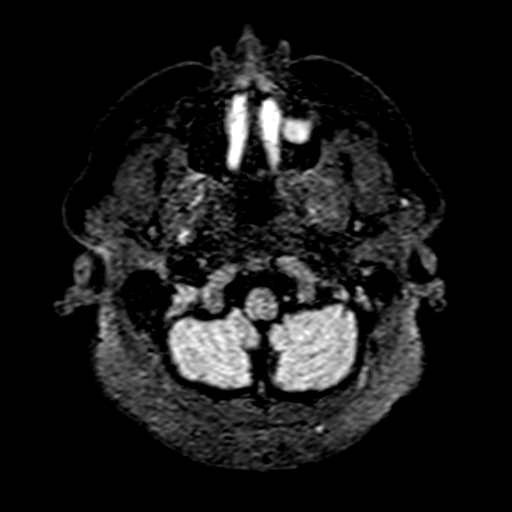
[im 42/126]
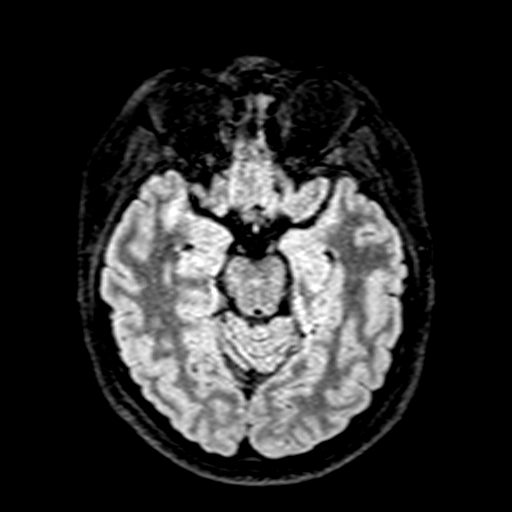
[im 84/126]
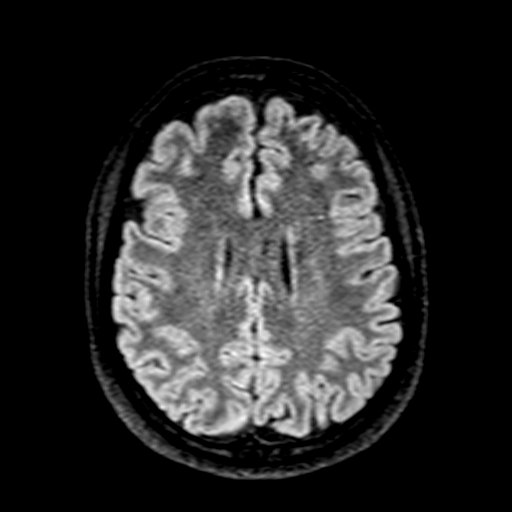
[im 126/126]
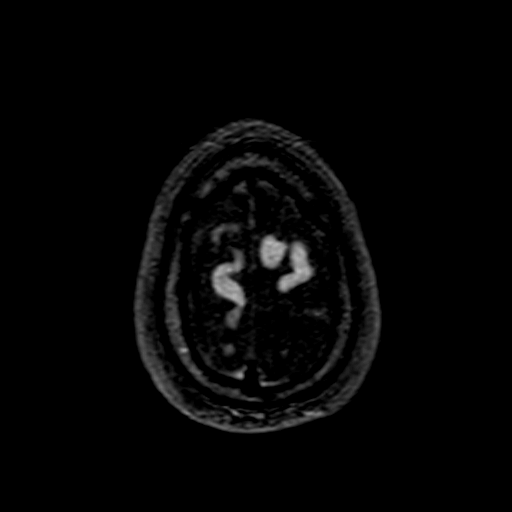

[Series 19: t2_space_dark-fluid_sag_p2_ns-ir_mpr_coronal · coronal · 1.0mm · 0.45mm/px · 1 of 128 slices shown]
[im 1/128]
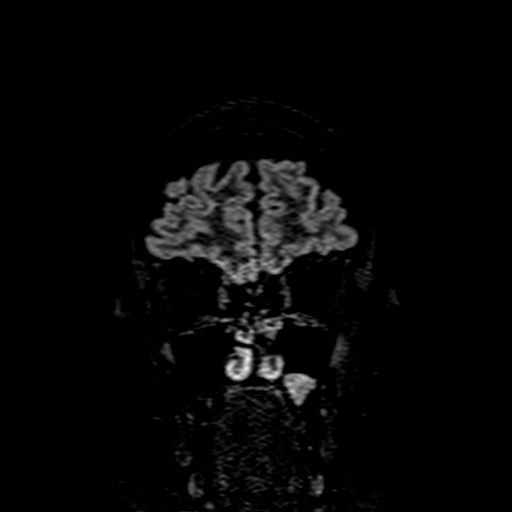

[Series 41: T1 post-contrast · coronal · 5.0mm · 0.36mm/px · 1 of 35 slices shown (1 of 2)]
[im 1/35]
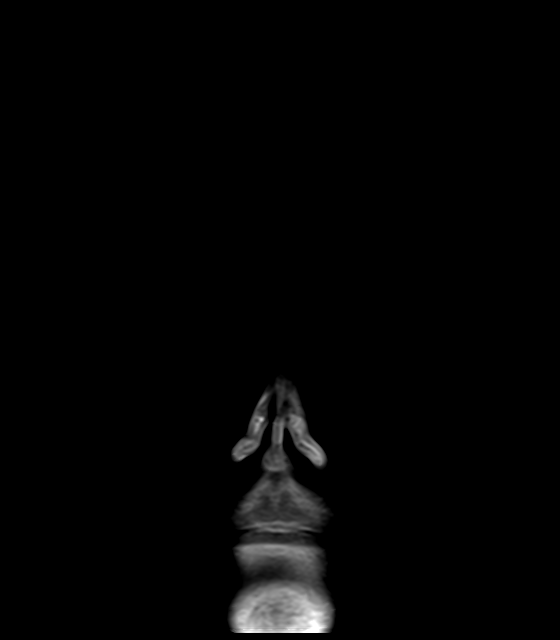

[Series 42: T1 post-contrast · sagittal · 4.0mm · 0.75mm/px · 1 of 36 slices shown (2 of 2)]
[im 1/36]
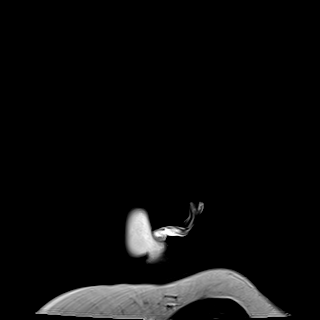

[Series 43: T2 post-contrast · coronal · 5.0mm · 0.72mm/px · 1 of 31 slices shown]
[im 1/31]
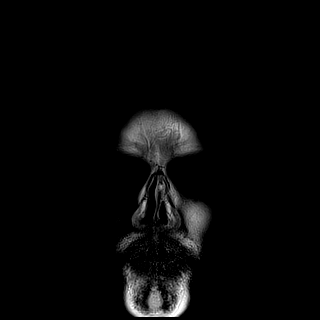

[33 of 48 positions shown; findings below may reference images not displayed]

FINDINGS: Brain: No acute infarct, mass effect or extra-axial collection. No
acute or chronic hemorrhage. Normal white matter signal, parenchymal
volume and CSF spaces. The midline structures are normal. There is
no abnormal contrast enhancement.

Vascular: Major flow voids are preserved.

Skull and upper cervical spine: Normal calvarium and skull base.
Visualized upper cervical spine and soft tissues are normal.

Sinuses/Orbits:No paranasal sinus fluid levels or advanced mucosal
thickening. No mastoid or middle ear effusion. Normal orbits.
IMPRESSION: Normal brain MRI.

## 2021-07-02 IMAGING — MR MR CERVICAL SPINE WO/W CM
7 of 11 series · 29 of 48 positions shown · IV contrast (Gadavist)
Comparison: None.

CLINICAL DATA: Demyelinating disease

EXAM:
MRI CERVICAL SPINE WITHOUT AND WITH CONTRAST
TECHNIQUE: Multiplanar and multiecho pulse sequences of the cervical spine, to
include the craniocervical junction and cervicothoracic junction,
were obtained without and with intravenous contrast.
CONTRAST:  9mL GADAVIST GADOBUTROL 1 MMOL/ML IV SOLN

[Series 25: T2 · sagittal · 3.0mm · 0.69mm/px · 3 of 18 slices shown (1 of 3)]
[im 1/18]
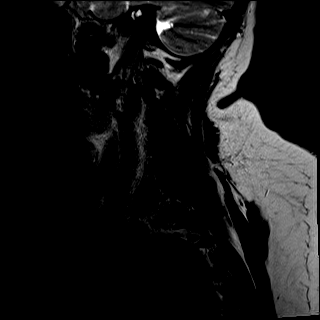
[im 9/18]
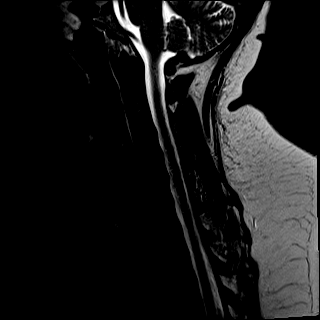
[im 18/18]
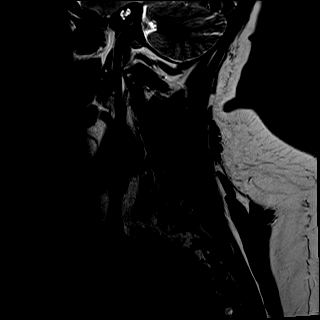

[Series 28: T2 · sagittal · 3.0mm · 0.69mm/px · 3 of 18 slices shown (2 of 3)]
[im 1/18]
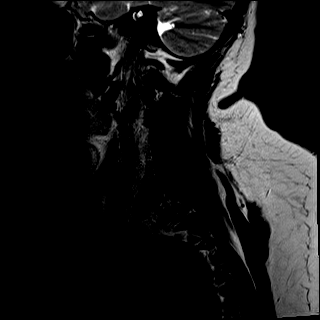
[im 9/18]
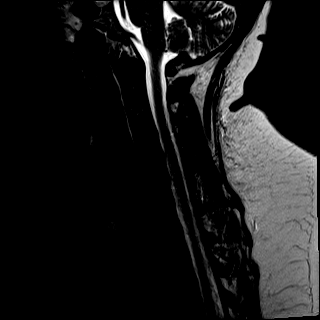
[im 18/18]
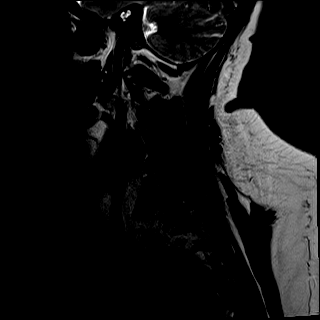

[Series 29: T2 · axial · 3.0mm · 0.78mm/px · z∈[-220,-82]mm · 6 of 44 slices shown (3 of 3)]
[im 1/44]
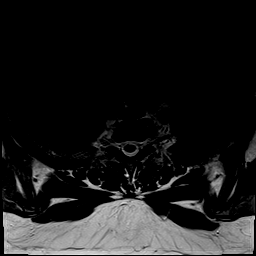
[im 9/44]
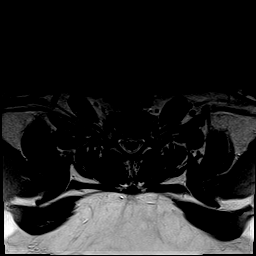
[im 18/44]
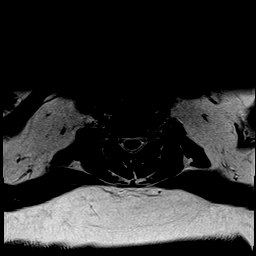
[im 26/44]
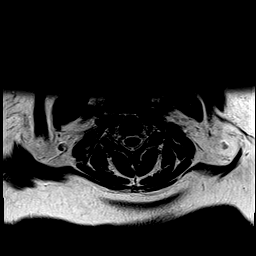
[im 35/44]
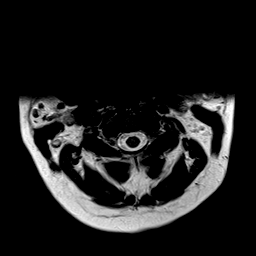
[im 44/44]
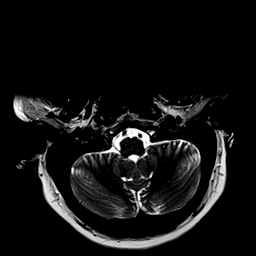

[Series 31: T1 · axial · 3.0mm · 0.39mm/px · z∈[-220,-82]mm · 6 of 44 slices shown (1 of 3)]
[im 1/44]
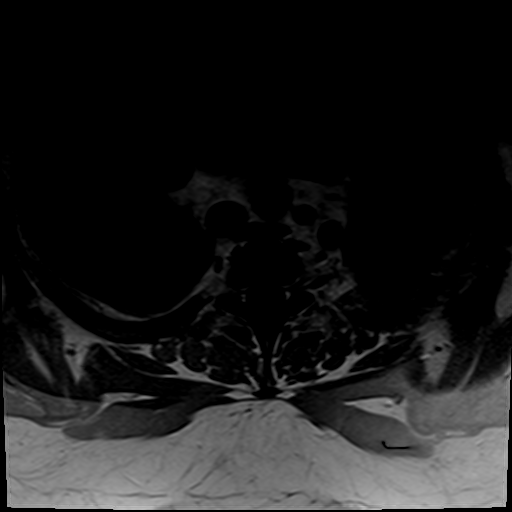
[im 9/44]
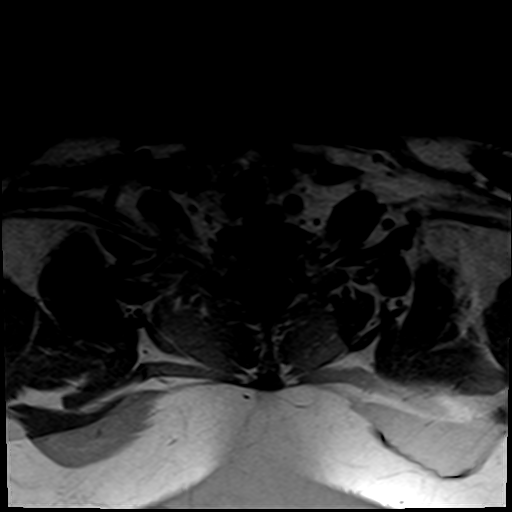
[im 18/44]
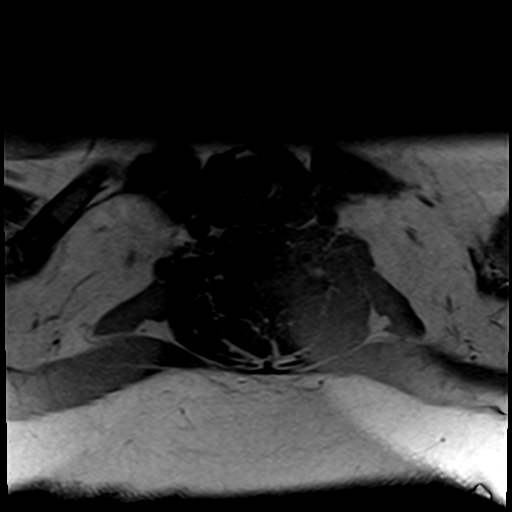
[im 26/44]
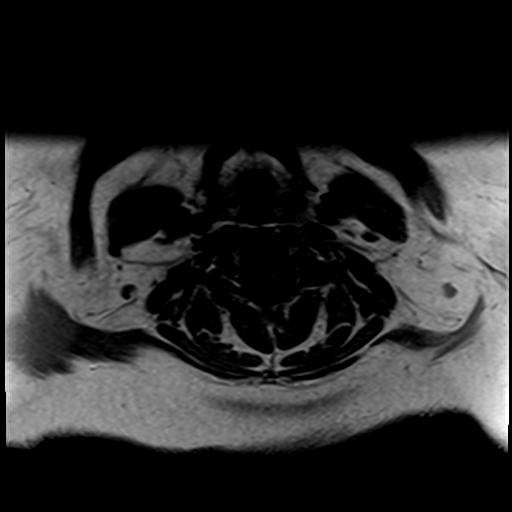
[im 35/44]
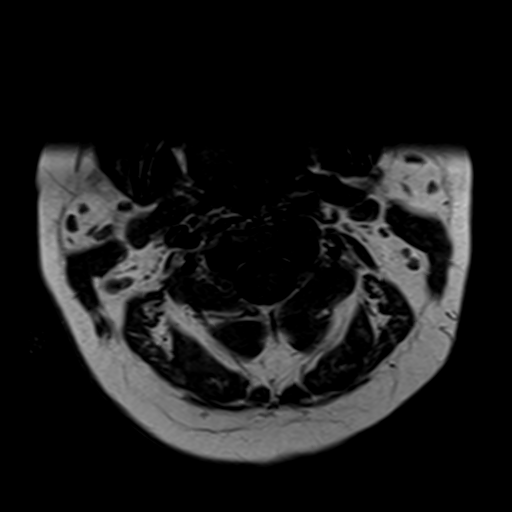
[im 44/44]
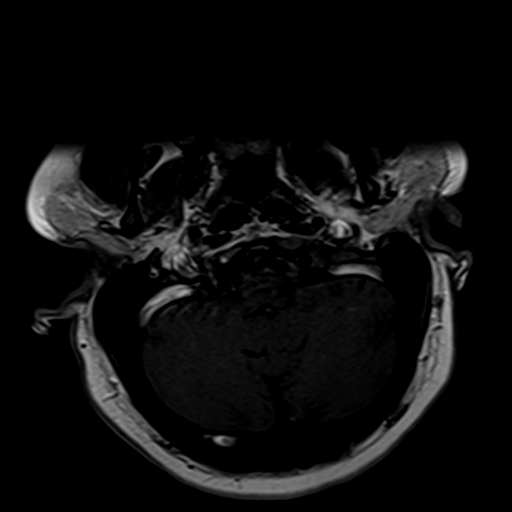

[Series 32: T1 · sagittal · 3.0mm · 0.86mm/px · 3 of 18 slices shown (2 of 3)]
[im 1/18]
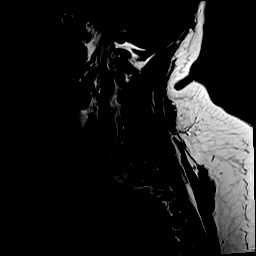
[im 9/18]
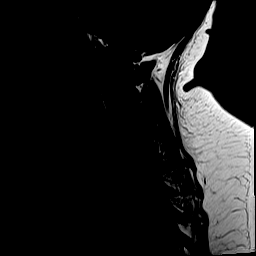
[im 18/18]
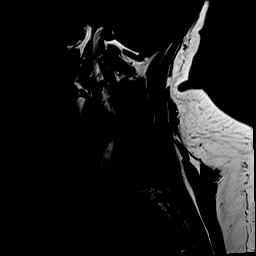

[Series 33: T1 · axial · 3.0mm · 0.39mm/px · z∈[-220,-82]mm · 6 of 44 slices shown (3 of 3)]
[im 1/44]
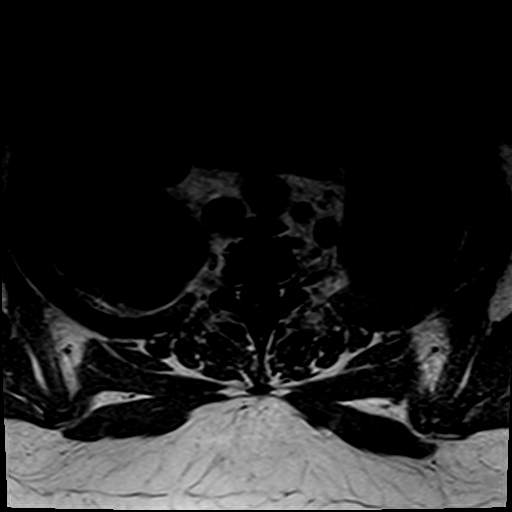
[im 9/44]
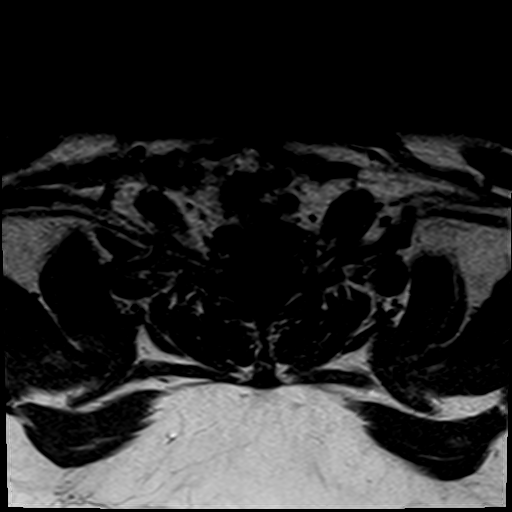
[im 18/44]
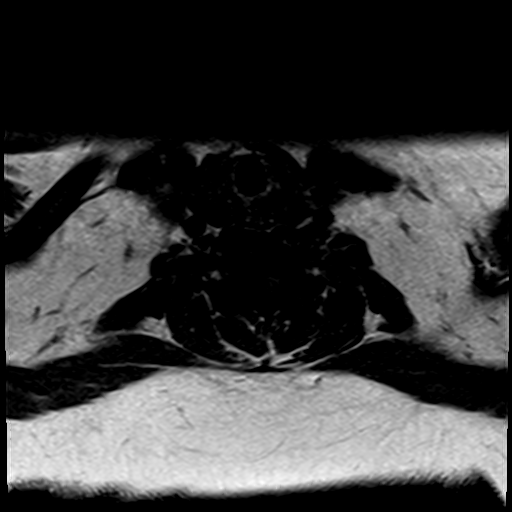
[im 26/44]
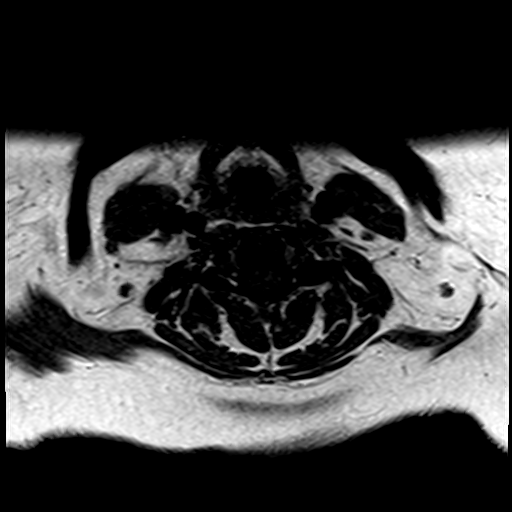
[im 35/44]
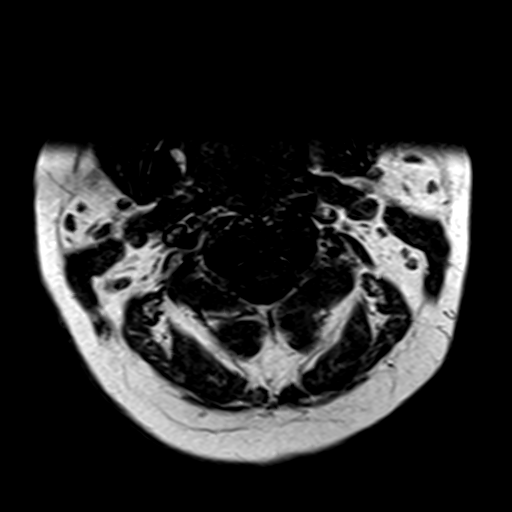
[im 44/44]
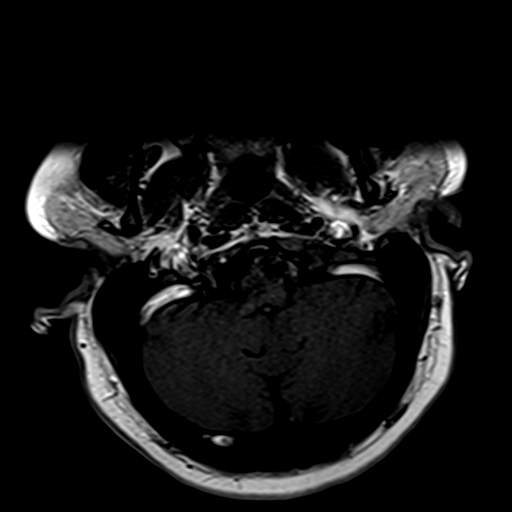

[Series 48: T1 fat-sat post-contrast · sagittal · 3.0mm · 0.43mm/px · 2 of 17 slices shown]
[im 1/17]
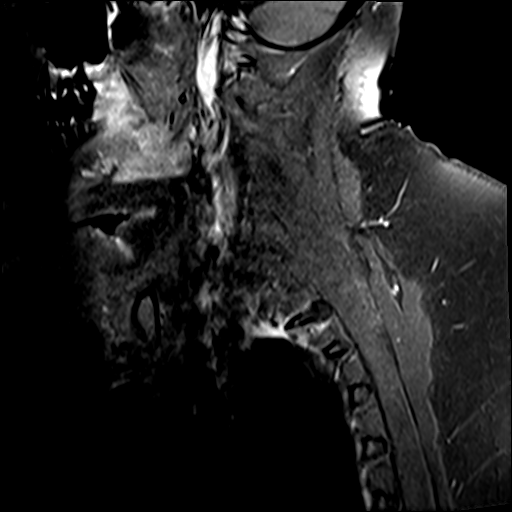
[im 9/17]
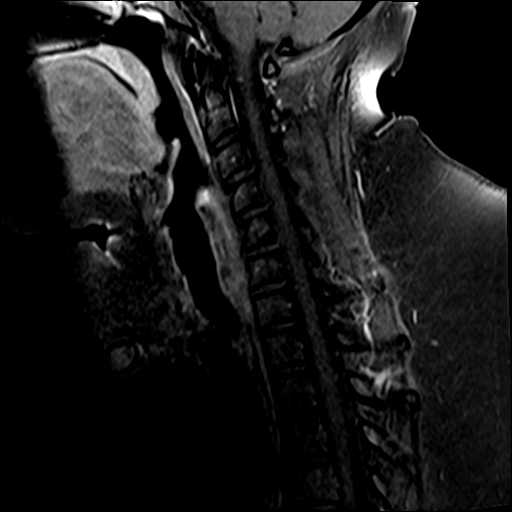

[29 of 48 positions shown; findings below may reference images not displayed]

FINDINGS: Alignment: Physiologic.

Vertebrae: No fracture, evidence of discitis, or bone lesion.

Cord: Normal signal and morphology.

Posterior Fossa, vertebral arteries, paraspinal tissues: Negative.

Disc levels:

No spinal canal or neural foraminal stenosis.
IMPRESSION: Normal MRI of the cervical spine. No evidence of demyelinating
disease.

## 2021-07-02 MED ORDER — GADOBUTROL 1 MMOL/ML IV SOLN
9.0000 mL | Freq: Once | INTRAVENOUS | Status: AC | PRN
  Administered 2021-07-02: 9 mL via INTRAVENOUS

## 2021-07-02 NOTE — Discharge Instructions (Signed)
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  Please return to the Emergency Department at any time if you change your mind about further assessment or have any difficulty breathing.  Otherwise, please follow up with your doctor ASAP.

## 2021-07-02 NOTE — Consult Note (Signed)
Neurology Consultation  Reason for Consult: Extremity weakness  Referring Physician: Dr. Rosalia Hammers  CC: Easy fatigability, extremity weakness  History is obtained from: Patient, chart review  HPI: [name] [name] is a 35 y.o. female with medical history significant for anxiety, depression, and obesity who presented to the ED 11/29 for evaluation of 3 days of extremity weakness and shortness of breath. She states that she has had ongoing trouble with her asthma lately and that she first noticed her leg weakness while walking up the stairs. She states that she was walking up one at a time and having to stop and catch her breath before noticing that her legs have a "jello" feeling like they are going to give out with a shuffling appearance to her gait. She also endorses that she has body aches that feel worse than aches associated with the flu. She also has noticed that she has easy fatigability of her bilateral upper extremities and she is unable to hold her phone up to her ear for long periods of time. She states that her extremities feel heavy and describes feeling like she is "walking through water" when ambulating. She denies any sensory changes, ascending pattern to her weakness, bowel or bladder dysfunction. An MRI brain and cervical spine were obtained without evidence of demyelinating disease or other acute processes. Neurology was consulted for further evaluation.   ROS: A complete ROS was performed and is negative except as noted in the HPI.   Past Medical History:  Diagnosis Date   Anxiety    Depression    Depression    Phreesia 06/30/2020   GERD (gastroesophageal reflux disease)    History of kidney stones    Pneumonitis 03/02/2019   Renal disorder    Past Surgical History:  Procedure Laterality Date   CYSTOSCOPY WITH RETROGRADE PYELOGRAM, URETEROSCOPY AND STENT PLACEMENT Right 12/10/2016   Procedure: CYSTOSCOPY WITH RETROGRADE PYELOGRAM, URETEROSCOPY AND STONE REMOVAL;  Surgeon:  Heloise Purpura, MD;  Location: [facility];  Service: Urology;  Laterality: Right;   WISDOM TOOTH EXTRACTION  at age 20   Family History  Problem Relation Age of Onset   Depression Mother    Anxiety disorder Mother    Social History:   reports that she has never smoked. She has never used smokeless tobacco. She reports current alcohol use of about 1.0 standard drink per week. She reports that she does not use drugs.  Medications No current facility-administered medications for this encounter.  Current Outpatient Medications:    albuterol (VENTOLIN HFA) 108 (90 Base) MCG/ACT inhaler, Inhale 2 puffs into the lungs every 6 (six) hours as needed for wheezing or shortness of breath (Cough)., Disp: 18 g, Rfl: 0   albuterol (VENTOLIN HFA) 108 (90 Base) MCG/ACT inhaler, Inhale 2 puffs into the lungs every 6 (six) hours as needed for wheezing or shortness of breath., Disp: 18 g, Rfl: 1   amoxicillin-clavulanate (AUGMENTIN) 875-125 MG tablet, Take 1 tablet by mouth 2 (two) times daily., Disp: 20 tablet, Rfl: 0   benzonatate (TESSALON) 100 MG capsule, Take 1-2 capsules (100-200 mg total) by mouth 3 (three) times daily as needed for cough., Disp: 60 capsule, Rfl: 0   budesonide (RHINOCORT AQUA) 32 MCG/ACT nasal spray, Place 1 spray into both nostrils daily., Disp: 8.6 g, Rfl: 6   cetirizine (ZYRTEC) 10 MG tablet, Take 1 tablet (10 mg total) by mouth daily., Disp: 60 tablet, Rfl: 6   Cholecalciferol (VITAMIN D3) 50 MCG (2000 UT) TABS, Take 1 oral tablet  once a day, Disp: 30 tablet, Rfl: 2   desonide (DESOWEN) 0.05 % ointment, Apply 1 application topically 2 (two) times daily as needed. Eczema behind the ears, Disp: 15 g, Rfl: 2   escitalopram (LEXAPRO) 20 MG tablet, Take 1 tablet (20 mg total) by mouth at bedtime., Disp: 180 tablet, Rfl: 0   fluconazole (DIFLUCAN) 150 MG tablet, Take 1 tablet today.  Take second tablet 3 days later., Disp: 2 tablet, Rfl: 0   fluticasone (FLOVENT HFA) 110 MCG/ACT inhaler,  Inhale 2 puffs into the lungs 2 (two) times daily., Disp: 12 g, Rfl: 11   fluticasone furoate-vilanterol (BREO ELLIPTA) 100-25 MCG/ACT AEPB, Inhale 1 puff into the lungs daily., Disp: 180 each, Rfl: 0   fluticasone-salmeterol (ADVAIR DISKUS) 100-50 MCG/ACT AEPB, Inhale 1 puff into the lungs 2 (two) times daily., Disp: 1 each, Rfl: 0   ipratropium (ATROVENT) 0.06 % nasal spray, Place 2 sprays into both nostrils 4 (four) times daily. As needed for nasal congestion, runny nose, Disp: 15 mL, Rfl: 12   lamoTRIgine (LAMICTAL) 200 MG tablet, Take 1 tablet (200 mg total) by mouth 2 (two) times daily., Disp: 360 tablet, Rfl: 0   lansoprazole (PREVACID) 30 MG capsule, Take 1 capsule (30 mg total) by mouth daily at 12 noon., Disp: 180 capsule, Rfl: 0   mometasone (NASONEX) 50 MCG/ACT nasal spray, Place 2 sprays into the nose daily., Disp: 1 each, Rfl: 12   montelukast (SINGULAIR) 10 MG tablet, Take 1 tablet (10 mg total) by mouth at bedtime., Disp: 180 tablet, Rfl: 0   pramipexole (MIRAPEX) 1.5 MG tablet, Take 1 tablet (1.5 mg total) by mouth at bedtime., Disp: 180 tablet, Rfl: 0   predniSONE (DELTASONE) 50 MG tablet, Take 1 tablet (50 mg total) by mouth daily with breakfast., Disp: 5 tablet, Rfl: 0   promethazine-dextromethorphan (PROMETHAZINE-DM) 6.25-15 MG/5ML syrup, Take 5 mLs by mouth at bedtime as needed for cough., Disp: 100 mL, Rfl: 0   thiamine 100 MG tablet, Take 100 mg by mouth daily., Disp: , Rfl:   Exam: Current vital signs: BP 111/70   Pulse 83   Temp 98 F (36.7 C) (Oral)   Resp (!) 23   LMP 06/05/2021   SpO2 100%  Vital signs in last 24 hours: Temp:  [98 F (36.7 C)-99.5 F (37.5 C)] 98 F (36.7 C) (11/29 0810) Pulse Rate:  [80-93] 83 (11/29 1045) Resp:  [12-23] 23 (11/29 1045) BP: (105-120)/(61-92) 111/70 (11/29 1045) SpO2:  [97 %-100 %] 100 % (11/29 1045)  GENERAL: Awake, alert, in no acute distress Psych: Affect appropriate for situation, patient is calm and cooperative with  examination Head: Normocephalic and atraumatic, without obvious abnormality EENT: Normal conjunctivae, dry mucous membranes, no OP obstruction, wears eyeglasses LUNGS: Tachypnea with muscle exertion as well as when standing and walking.  CV: Regular rate and rhythm on telemetry ABDOMEN: Soft, rounded, non-tender, non-distended Extremities: warm, well perfused, without obvious deformity  NEURO:  Mental Status: Awake, alert, and oriented to person, place, time, and situation. She is able to provide a clear and coherent history of present illness. Speech/Language: speech is intact without dysarthria.   Naming, repetition, fluency, and comprehension intact without aphasia No neglect is noted Cranial Nerves:  II: PERRL 4 mm/brisk. Visual fields full.  III, IV, VI: EOMI without ptosis, nystagmus, or gaze preference. No diplopia. No EOM weakness.  V: Sensation is intact to light touch and symmetrical to face.  VII: Face is symmetric resting and smiling.  VIII: Hearing  is intact to voice IX, X: Palate elevation is symmetric. Phonation normal.  XI: Normal sternocleidomastoid and trapezius muscle strength XII: Tongue protrudes midline without fasciculations.   Motor: Patient has generalized weakness and fatigability throughout.  BUE elevate antigravity without vertical drift with 5/5 grip strength and 4+/5 proximal strength.  BLE have 4-/5 strength with ankle plantarflexion, 4+/5 dorsiflexion, 4+/5 knee flexion, 4-/5 knee extension, and 4+/5 hip strength.  She becomes short of breath with all of the above. Breathing normalizes quickly post-exertion.  Tone is normal. Bulk is normal.  Sensation: Intact to light touch and cool temperature bilaterally in all four extremities. Coordination: FTN intact bilaterally. HKS intact bilaterally. No pronator drift.  DTRs: 3+ reflexes to brachioradialis, biceps and patellae without asymmetry. 2+ achilles reflexes. 2-3 beats of low-amplitude clonus with ankle  dorsiflexion bilaterally.  Gait: Shuffling appearing gait without unsteadiness. Symmetrically weak hip flexion during ambulation. Becomes short of breath when standing and walking.   Labs I have reviewed labs in epic and the results pertinent to this consultation are: CBC    Component Value Date/Time   WBC 10.3 07/01/2021 2241   RBC 4.59 07/01/2021 2241   HGB 13.5 07/01/2021 2241   HGB 12.7 06/26/2021 1051   HCT 41.6 07/01/2021 2241   HCT 38.5 06/26/2021 1051   PLT 360 07/01/2021 2241   PLT 305 06/26/2021 1051   MCV 90.6 07/01/2021 2241   MCV 88 06/26/2021 1051   MCH 29.4 07/01/2021 2241   MCHC 32.5 07/01/2021 2241   RDW 14.1 07/01/2021 2241   RDW 13.5 06/26/2021 1051   LYMPHSABS 1.0 07/01/2021 2241   LYMPHSABS 2.0 06/26/2021 1051   MONOABS 0.1 07/01/2021 2241   EOSABS 0.0 07/01/2021 2241   EOSABS 0.2 06/26/2021 1051   BASOSABS 0.0 07/01/2021 2241   BASOSABS 0.0 06/26/2021 1051   CMP     Component Value Date/Time   NA 136 07/01/2021 2241   K 4.6 07/01/2021 2241   CL 106 07/01/2021 2241   CO2 20 (L) 07/01/2021 2241   GLUCOSE 183 (H) 07/01/2021 2241   BUN 10 07/01/2021 2241   CREATININE 0.69 07/01/2021 2241   CALCIUM 8.9 07/01/2021 2241   PROT 6.7 07/01/2021 2241   ALBUMIN 3.5 07/01/2021 2241   AST 20 07/01/2021 2241   ALT 17 07/01/2021 2241   ALKPHOS 82 07/01/2021 2241   BILITOT 0.5 07/01/2021 2241   GFRNONAA >60 07/01/2021 2241   GFRAA >60 10/15/2018 2329   Lipid Panel     Component Value Date/Time   CHOL 175 01/24/2017 0611   TRIG 171 (H) 01/24/2017 0611   HDL 43 01/24/2017 0611   CHOLHDL 4.1 01/24/2017 0611   VLDL 34 01/24/2017 0611   LDLCALC 98 01/24/2017 0611   Lab Results  Component Value Date   HGBA1C 5.0 01/24/2017   Lab Results  Component Value Date   VITAMINB12 360 07/01/2021   Lab Results  Component Value Date   TSH 0.932 07/01/2021    Latest Reference Range & Units 06/30/21 13:00 07/01/21 22:21  Influenza A By PCR NEGATIVE   NEGATIVE   Influenza B By PCR NEGATIVE   NEGATIVE  Mono Screen NEGATIVE  NEGATIVE   SARS Coronavirus 2 by RT PCR NEGATIVE   NEGATIVE   Urinalysis    Component Value Date/Time   COLORURINE YELLOW 07/01/2021 2221   APPEARANCEUR CLEAR 07/01/2021 2221   LABSPEC 1.016 07/01/2021 2221   PHURINE 7.0 07/01/2021 2221   GLUCOSEU NEGATIVE 07/01/2021 2221   HGBUR NEGATIVE 07/01/2021  2221   BILIRUBINUR NEGATIVE 07/01/2021 2221   KETONESUR NEGATIVE 07/01/2021 2221   PROTEINUR NEGATIVE 07/01/2021 2221   UROBILINOGEN 0.2 06/29/2020 0945   NITRITE NEGATIVE 07/01/2021 2221   LEUKOCYTESUR NEGATIVE 07/01/2021 2221   Lab Results  Component Value Date   CKTOTAL 47 07/01/2021   Imaging I have reviewed the images obtained:  MRI cervical spine wwo 11/28: Normal MRI of the cervical spine. No evidence of demyelinating disease.  MRI examination of the brain 11/28: Normal brain MRI.  Assessment: 35 y.o. female with history as above who presented to the ED 11/29 for evaluation of 3 days of extremity weakness lower > upper, fatigability, and shortness of breath.  - Examination reveals patient with generalized weakness and fatigability, 2-3 beats of clonus with ankle dorsiflexion bilaterally, and 3+ reflexes. Patient does not have sensory deficits. - MRI brain and cervical spine as above.  - Patient's presentation is most consistent with weakness secondary to viral infection. Though her influenza and COVID swabs are negative for active infection, numerous other viruses can result in community acquired infections with associated symptoms of malaise, muscle weakness, muscle pain, shortness of breath and fatigue. Low likelihood of a thoracic myelopathy, but will need to evaluate with MRI. Significantly lower on the DDx would be myasthenia gravis given her age, lack of daily fluctuation in weakness, symmetry of symptoms and muscle weakness and no bulbar symptoms. Presence of reflexes militates against GBS.  - Patient's  vitamin B12 is mildly low for neurologic standards at 360 (goal > 400).   Recommendations: - MRI thoracic and lumbar spine wo contrast - Neurology will follow imaging  - Evaluation of her shortness of breath per EDP  Pt seen by NP/Neuro and later by MD.   Lanae Boast, AGAC-NP [facility] Pager: [phone]  I have seen and examined the patient. I have discussed the assessment and recommendations with the Neurolhospitalist NP and made amendations as needed. I have also discussed the patient with the ED provider. 35 year old female with history as above who presented to the ED 11/29 for evaluation of 3 days of extremity weakness lower > upper, fatigability, and shortness of breath. Patient's presentation is most consistent with weakness secondary to viral infection. Though her influenza and COVID swabs are negative for active infection, numerous other viruses can result in community acquired infections with associated symptoms of malaise, muscle weakness, muscle pain, shortness of breath and fatigue. Low likelihood of a thoracic myelopathy, but will need to evaluate with MRI. Significantly lower on the DDx would be myasthenia gravis given her age, lack of daily fluctuation in weakness, symmetry of symptoms and muscle weakness and no bulbar symptoms. Presence of reflexes militates against GBS. Recommendations as above.  Electronically signed: Dr. [name]

## 2021-07-02 NOTE — ED Notes (Signed)
Pt wanting to leave AMA at this time. Dr. Rosalia Hammers aware.

## 2021-07-02 NOTE — ED Provider Notes (Signed)
[facility] EMERGENCY DEPARTMENT Provider Note   CSN: [id number] Arrival date & time: 07/01/21  2104     History Chief Complaint  Patient presents with   Weakness    [name] [name] is a 35 y.o. female.  HPI 35 yo female presets with lower extremity weakness.  Patient has had URI symptoms and was treated for sinusitis with Augmentin.  [name] [name] felt better from the ninth of the 19th when she was on the Augmentin.  She was off of it over the weekend and had some fevers.  She has now noted difficulty walking.  She feels that she is weak and she has to shuffle.  She has difficulty standing up from a seated position.  She has also had cough with this.  She has a history of asthma.  She has had some difficulty breathing and has had to have her asthma medications adjusted.  She does not feel that she is having trouble with her feet or ankles.  She denies any numbness.  Patient was initially seen and evaluated at urgent care which had a chest x-[name] [name] done, with minimal basilar atelectasis, normal sed rate, normal CBC and negative POC flu and mono testing.  She is then seen here and screened.  MR of the head and neck were ordered.  Neurology was consulted via phone.  She has not had her MRI and is to be reevaluated.    Past Medical History:  Diagnosis Date   Anxiety    Depression    [employer]rees[employer] 06/30/2020   GERD (gastroesophageal reflux disease)    History of kidney stones    Pneumonitis 03/02/2019    Patient Active Problem List   Diagnosis Date Noted   Brain fog 06/12/2021   POTS (postural orthostatic tachycardia syndrome) 06/12/2021   Sinusitis, acute 01/01/2021   Other atopic dermatitis 04/16/2020   Food allergy 01/12/2020   Class 3 severe obesity due to excess calories without serious comorbidity with body mass index (BMI) of 45.0 to 49.9 in adult [facility]) 09/06/2019   Eczema of external ear, 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     Family History  Problem Relation Age of Onset   Depression Mother    Anxiety disorder Mother     Social History   Tobacco Use   Smoking status: Never   Smokeless tobacco: Never  Vaping Use   Vaping Use: Never used  Substance Use Topics   Alcohol use: Yes    Alcohol/week: 1.0 standard drink    Types: 1 Glasses of wine per week    Comment: per week   Drug use: No    Home Medications Prior to Admission medications   Medication Sig Start Date End Date Taking? Authorizing Provider  albuterol (VENTOLIN HFA) 108 (90 Base) MCG/ACT inhaler Inhale 2 puffs into the lungs every 6 (six) hours as needed for wheezing or shortness of breath (Cough). 06/26/21   Theadora Rama Scales, PA-C  albuterol (VENTOLIN HFA) 108 (90 Base) MCG/ACT inhaler Inhale 2  puffs into the lungs every 6 (six) hours as needed for wheezing or shortness of breath. 06/26/21   Theadora Rama Scales, PA-C  amoxicillin-clavulanate (AUGMENTIN) 875-125 MG tablet Take 1 tablet by mouth 2 (two) times daily. 06/12/21   Storm Frisk, MD  benzonatate (TESSALON) 100 MG capsule Take 1-2 capsules (100-200 mg total) by mouth 3 (three) times daily as needed for cough. 06/30/21   Wallis Bamberg, PA-C  budesonide (RHINOCORT AQUA) 32 MCG/ACT nasal spray Place 1 spray into both nostrils daily. 06/12/21   Storm Frisk, MD  cetirizine (ZYRTEC) 10 MG tablet Take 1 tablet (10 mg total) by mouth daily. 06/12/21   Storm Frisk, MD  Cholecalciferol (VITAMIN D3) 50 MCG (2000 UT)  TABS Take 1 oral tablet once a day 05/14/21     desonide (DESOWEN) 0.05 % ointment Apply 1 application topically 2 (two) times daily as needed. Eczema behind the ears 05/01/20   Ellamae Sia, DO  escitalopram (LEXAPRO) 20 MG tablet Take 1 tablet (20 mg total) by mouth at bedtime. 06/26/21 12/23/21  Theadora Rama Scales, PA-C  fluconazole (DIFLUCAN) 150 MG tablet Take 1 tablet today.  Take second tablet 3 days later. 06/26/21   Theadora Rama Scales, PA-C  fluticasone (FLOVENT HFA) 110 MCG/ACT inhaler Inhale 2 puffs into the lungs 2 (two) times daily. 06/12/21   Storm Frisk, MD  fluticasone furoate-vilanterol (BREO ELLIPTA) 100-25 MCG/ACT AEPB Inhale 1 puff into the lungs daily. 06/26/21 12/23/21  Theadora Rama Scales, PA-C  fluticasone-salmeterol (ADVAIR DISKUS) 100-50 MCG/ACT AEPB Inhale 1 puff into the lungs 2 (two) times daily. 06/26/21   Theadora Rama Scales, PA-C  ipratropium (ATROVENT) 0.06 % nasal spray Place 2 sprays into both nostrils 4 (four) times daily. As needed for nasal congestion, runny nose 06/26/21   Theadora Rama Scales, PA-C  lamoTRIgine (LAMICTAL) 200 MG tablet Take 1 tablet (200 mg total) by mouth 2 (two) times daily. 06/26/21 12/23/21  Theadora Rama Scales, PA-C  lansoprazole (PREVACID) 30 MG capsule Take 1 capsule (30 mg total) by mouth daily at 12 noon. 06/26/21 12/23/21  Theadora Rama Scales, PA-C  mometasone (NASONEX) 50 MCG/ACT nasal spray Place 2 sprays into the nose daily. 06/26/21   Theadora Rama Scales, PA-C  montelukast (SINGULAIR) 10 MG tablet Take 1 tablet (10 mg total) by mouth at bedtime. 06/26/21 12/23/21  Theadora Rama Scales, PA-C  pramipexole (MIRAPEX) 1.5 MG tablet Take 1 tablet (1.5 mg total) by mouth at bedtime. 06/26/21 12/23/21  Theadora Rama Scales, PA-C  predniSONE (DELTASONE) 50 MG tablet Take 1 tablet (50 mg total) by mouth daily with breakfast. 06/30/21   Wallis Bamberg, PA-C  promethazine-dextromethorphan (PROMETHAZINE-DM) 6.25-15 MG/5ML  syrup Take 5 mLs by mouth at bedtime as needed for cough. 06/30/21   Wallis Bamberg, PA-C  thiamine 100 MG tablet Take 100 mg by mouth daily.    [provider]    Allergies    Other and Wellbutrin [bupropion]  Review of Systems   Review of Systems  All other systems reviewed and are negative.  Physical Exam Updated Vital Signs BP (!) 115/46   Pulse 85   Temp 98 F (36.7 C) (Oral)   Resp (!) 25   LMP 06/05/2021   SpO2 98%   Physical Exam Vitals and nursing note reviewed.  Constitutional:      Appearance: Normal appearance. She is obese.  HENT:     Head: Normocephalic and atraumatic.     Right Ear: External ear normal.  Left Ear: External ear normal.     Nose: Nose normal.     Mouth/Throat:     Pharynx: Oropharynx is clear.  Eyes:     Extraocular Movements: Extraocular movements intact.     Pupils: Pupils are equal, round, and reactive to light.  Cardiovascular:     Rate and Rhythm: Normal rate and regular rhythm.     Pulses: Normal pulses.  Pulmonary:     Breath sounds: Normal breath sounds.     Comments: Tachypnea noted Abdominal:     General: Abdomen is flat. Bowel sounds are normal.     Palpations: Abdomen is soft.  Musculoskeletal:        General: Normal range of motion.     Cervical back: Normal range of motion.  Skin:    General: Skin is warm.     Capillary Refill: Capillary refill takes less than 2 seconds.  Neurological:     General: No focal deficit present.     Mental Status: She is alert.     Cranial Nerves: No cranial nerve deficit.     Sensory: No sensory deficit.     Motor: Weakness present.     Coordination: Coordination normal.     Comments: Strength 5 out of 5 bilateral toe dorsi and plantar flexion as well as ankle dorsal and plantar flexion She has some decreased strength at knee extension, no decreased knee flexion strength noted She has significant decreased strength at hip flexion No palmar drift Upper extremities strength  appears normal    ED Results / Procedures / Treatments   Labs (all labs ordered are listed, but only abnormal results are displayed) Labs Reviewed  COMPREHENSIVE METABOLIC PANEL - Abnormal; Notable for the following components:      Result Value   CO2 20 (*)    Glucose, Bld 183 (*)    All other components within normal limits  CBC WITH DIFFERENTIAL/PLATELET - Abnormal; Notable for the following components:   Neutro Abs 9.0 (*)    Abs Immature Granulocytes 0.14 (*)    All other components within normal limits  I-STAT BETA HCG BLOOD, ED ([facility], [facility], [facility] ONLY) - Abnormal; Notable for the following components:   I-stat hCG, quantitative 8.1 (*)    All other components within normal limits  RESP PANEL BY RT-PCR (FLU A&B, COVID) ARPGX2  URINALYSIS, ROUTINE W REFLEX MICROSCOPIC  MAGNESIUM  TSH  CK  VITAMIN B12  HCG, QUANTITATIVE, PREGNANCY    EKG EKG Interpretation  Date/Time:  Monday July 01 2021 21:51:44 EST Ventricular Rate:  89 PR Interval:  154 QRS Duration: 80 QT Interval:  348 QTC Calculation: 423 R Axis:   73 Text Interpretation: Normal sinus rhythm Normal ECG No significant change since last tracing Confirmed by Susy Frizzle [phone]) on 07/02/2021 9:19:06 AM  Radiology MR Brain W and Wo Contrast  Result Date: 07/02/2021 CLINICAL DATA:  Demyelinating disease EXAM: MRI HEAD WITHOUT AND WITH CONTRAST TECHNIQUE: Multiplanar, multiecho pulse sequences of the brain and surrounding structures were obtained without and with intravenous contrast. CONTRAST:  31mL GADAVIST GADOBUTROL 1 MMOL/ML IV SOLN COMPARISON:  None. FINDINGS: Brain: No acute infarct, mass effect or extra-axial collection. No acute or chronic hemorrhage. Normal white matter signal, parenchymal volume and CSF spaces. The midline structures are normal. There is no abnormal contrast enhancement. Vascular: Major flow voids are preserved. Skull and upper cervical spine: Normal calvarium and skull base. Visualized  upper cervical spine and soft tissues are normal. Sinuses/Orbits:No paranasal sinus fluid  levels or advanced mucosal thickening. No mastoid or middle ear effusion. Normal orbits. IMPRESSION: Normal brain MRI. Electronically Signed   By: Deatra Robinson M.D.   On: 07/02/2021 03:31   MR CERVICAL SPINE W WO CONTRAST  Result Date: 07/02/2021 CLINICAL DATA:  Demyelinating disease EXAM: MRI CERVICAL SPINE WITHOUT AND WITH CONTRAST TECHNIQUE: Multiplanar and multiecho pulse sequences of the cervical spine, to include the craniocervical junction and cervicothoracic junction, were obtained without and with intravenous contrast. CONTRAST:  22mL GADAVIST GADOBUTROL 1 MMOL/ML IV SOLN COMPARISON:  None. FINDINGS: Alignment: Physiologic. Vertebrae: No fracture, evidence of discitis, or bone lesion. Cord: Normal signal and morphology. Posterior Fossa, vertebral arteries, paraspinal tissues: Negative. Disc levels: No spinal canal or neural foraminal stenosis. IMPRESSION: Normal MRI of the cervical spine. No evidence of demyelinating disease. Electronically Signed   By: Deatra Robinson M.D.   On: 07/02/2021 03:33    Procedures Procedures   Medications Ordered in ED Medications  LORazepam (ATIVAN) injection 1 mg (1 mg Intravenous Given 07/02/21 0115)  gadobutrol (GADAVIST) 1 MMOL/ML injection 9 mL (9 mLs Intravenous Contrast Given 07/02/21 0321)    ED Course  I have reviewed the triage vital signs and the nursing notes.  Pertinent labs & imaging results that were available during my care of the patient were reviewed by me and considered in my medical decision making (see chart for details).    MDM Rules/Calculators/A&P                         35 year old female with recent febrile illness, also recently on antibiotics who presents today with proximal leg weakness difficulty walking.   Patient with lower extremity weakness.  Concern for Candie Echevaria discussed with Dr. Otelia Limes.  Plan admission to medicine for  observation and mr of thoracic and lumbar spine. Patient now wishes to leave.  I have discussed risks with patient including respiratory distress and arrest.  We have discussed return precautions.  We discussed that she can return at any time if she is feeling worse.  They do live within the city and her husband will be with her. The patient has decided to leave against medical advice.  The patient had a normal mental status examination and understands  her condition and the risks of leaving [facility] including respiratory arrest  permanent disability and death. The patient has had an opportunity to ask questions about his/her medical condition. The patient has been informed that she may return for care at any time The patient's family member was present for the conversation.   Final Clinical Impression(s) / ED Diagnoses Final diagnoses:  Weakness    Rx / DC Orders ED Discharge Orders     None        Margarita Grizzle, MD 07/02/21 1524

## 2021-07-03 ENCOUNTER — Ambulatory Visit: Payer: Self-pay | Admitting: Physician Assistant

## 2021-07-03 ENCOUNTER — Other Ambulatory Visit: Payer: Self-pay

## 2021-07-07 NOTE — Progress Notes (Signed)
Established Patient Office Visit  Subjective:  Patient ID: [name] [name], female    DOB: 1985-11-03  Age: 35 y.o. MRN: [id number]  CC:  Chief Complaint  Patient presents with   Hospitalization Follow-up    HPI [name] A Cantara presents for post emergency room follow-up.  Patient was in the emergency room between the 28th and 29 November with lower extremity weakness.  Below is documentation from that emergency room visit  35 yo female presets with lower extremity weakness.  Patient has had URI symptoms and was treated for sinusitis with Augmentin.  [name] [name] felt better from the ninth of the 19th when she was on the Augmentin.  She was off of it over the weekend and had some fevers.  She has now noted difficulty walking.  She feels that she is weak and she has to shuffle.  She has difficulty standing up from a seated position.  She has also had cough with this.  She has a history of asthma.  She has had some difficulty breathing and has had to have her asthma medications adjusted.  She does not feel that she is having trouble with her feet or ankles.  She denies any numbness.  Patient was initially seen and evaluated at urgent care which had a chest x-ray done, with minimal basilar atelectasis, normal sed rate, normal CBC and negative POC flu and mono testing.  She is then seen here and screened.  MR of the head and neck were ordered.  Neurology was consulted via phone.  She has not had her MRI and is to be reevaluated. 35 year old female with recent febrile illness, also recently on antibiotics who presents today with proximal leg weakness difficulty walking.   Patient with lower extremity weakness.  Concern for [name] discussed with Dr. Cheral Marker.  Plan admission to medicine for observation and mr of thoracic and lumbar spine. Patient now wishes to leave.  I have discussed risks with patient including respiratory distress and arrest.  We have discussed return precautions.  We discussed  that she can return at any time if she is feeling worse.  They do live within the city and her husband will be with her. Pt left AMA  needs Thoracic /lumbar MRI  The patient did leave [place] was going to be admitted for observation and have additional testing for potential Guillain-Barr.  The patient is self-pay.  The patient states her symptoms have progressed.  She now has to use a Rollator to walk.  She has extreme weakness in her lower extremities and also now in her upper arm.  She has sensory changes in her feet as well.  She has severe muscle aches in the lower extremity.  He has difficulty sleeping and low-grade temperatures to 100 degrees.  She has loose stools.  Patient also has difficulty taking a deep breath.  She states there is a rash over the back of her neck.  She did take the 50 mg prednisone given to her in the emergency room and finished this without any change in symptoms.   Past Medical History:  Diagnosis Date   Anxiety    Depression    [employer] 06/30/2020   GERD (gastroesophageal reflux disease)    History of kidney stones    Pneumonitis 03/02/2019    Past Surgical History:  Procedure Laterality Date   CYSTOSCOPY WITH RETROGRADE PYELOGRAM, URETEROSCOPY AND STENT PLACEMENT Right 12/10/2016   Procedure: CYSTOSCOPY WITH RETROGRADE PYELOGRAM, URETEROSCOPY AND STONE REMOVAL;  Surgeon:  Raynelle Bring, MD;  Location: [facility];  Service: Urology;  Laterality: Right;   WISDOM TOOTH EXTRACTION  at age 89    Family History  Problem Relation Age of Onset   Depression Mother    Anxiety disorder Mother     Social History   Socioeconomic History   Marital status: Significant Other    Spouse name: Not on file   Number of children: Not on file   Years of education: Not on file   Highest education level: Not on file  Occupational History   Not on file  Tobacco Use   Smoking status: Never   Smokeless tobacco: Never  Vaping Use   Vaping Use: Never used   Substance and Sexual Activity   Alcohol use: Yes    Alcohol/week: 1.0 standard drink    Types: 1 Glasses of wine per week    Comment: per week   Drug use: No   Sexual activity: Yes    Birth control/protection: I.U.D.  Other Topics Concern   Not on file  Social History Narrative   Not on file   Social Determinants of Health   Financial Resource Strain: Not on file  Food Insecurity: Not on file  Transportation Needs: Not on file  Physical Activity: Not on file  Stress: Not on file  Social Connections: Not on file  Intimate Partner Violence: Not on file    Outpatient Medications Prior to Visit  Medication Sig Dispense Refill   albuterol (VENTOLIN HFA) 108 (90 Base) MCG/ACT inhaler Inhale 2 puffs into the lungs every 6 (six) hours as needed for wheezing or shortness of breath (Cough). 18 g 0   albuterol (VENTOLIN HFA) 108 (90 Base) MCG/ACT inhaler Inhale 2 puffs into the lungs every 6 (six) hours as needed for wheezing or shortness of breath. 18 g 1   benzonatate (TESSALON) 100 MG capsule Take 1-2 capsules (100-200 mg total) by mouth 3 (three) times daily as needed for cough. 60 capsule 0   budesonide (RHINOCORT AQUA) 32 MCG/ACT nasal spray Place 1 spray into both nostrils daily. 8.6 g 6   cetirizine (ZYRTEC) 10 MG tablet Take 1 tablet (10 mg total) by mouth daily. 60 tablet 6   Cholecalciferol (VITAMIN D3) 50 MCG (2000 UT) TABS Take 1 oral tablet once a day 30 tablet 2   escitalopram (LEXAPRO) 20 MG tablet Take 1 tablet (20 mg total) by mouth at bedtime. 180 tablet 0   fluconazole (DIFLUCAN) 150 MG tablet Take 1 tablet today.  Take second tablet 3 days later. 2 tablet 0   fluticasone (FLOVENT HFA) 110 MCG/ACT inhaler Inhale 2 puffs into the lungs 2 (two) times daily. 12 g 11   fluticasone-salmeterol (ADVAIR DISKUS) 100-50 MCG/ACT AEPB Inhale 1 puff into the lungs 2 (two) times daily. 1 each 0   ipratropium (ATROVENT) 0.06 % nasal spray Place 2 sprays into both nostrils 4 (four)  times daily. As needed for nasal congestion, runny nose 15 mL 12   lamoTRIgine (LAMICTAL) 200 MG tablet Take 1 tablet (200 mg total) by mouth 2 (two) times daily. 360 tablet 0   lansoprazole (PREVACID) 30 MG capsule Take 1 capsule (30 mg total) by mouth daily at 12 noon. 180 capsule 0   mometasone (NASONEX) 50 MCG/ACT nasal spray Place 2 sprays into the nose daily. 1 each 12   montelukast (SINGULAIR) 10 MG tablet Take 1 tablet (10 mg total) by mouth at bedtime. 180 tablet 0   pramipexole (MIRAPEX) 1.5 MG tablet  Take 1 tablet (1.5 mg total) by mouth at bedtime. 180 tablet 0   promethazine-dextromethorphan (PROMETHAZINE-DM) 6.25-15 MG/5ML syrup Take 5 mLs by mouth at bedtime as needed for cough. 100 mL 0   thiamine 100 MG tablet Take 100 mg by mouth daily.     desonide (DESOWEN) 0.05 % ointment Apply 1 application topically 2 (two) times daily as needed. Eczema behind the ears 15 g 2   fluticasone furoate-vilanterol (BREO ELLIPTA) 100-25 MCG/ACT AEPB Inhale 1 puff into the lungs daily. 180 each 0   predniSONE (DELTASONE) 50 MG tablet Take 1 tablet (50 mg total) by mouth daily with breakfast. 5 tablet 0   amoxicillin-clavulanate (AUGMENTIN) 875-125 MG tablet Take 1 tablet by mouth 2 (two) times daily. (Patient not taking: Reported on 07/08/2021) 20 tablet 0   No facility-administered medications prior to visit.    Allergies  Allergen Reactions   Other     Bananas, kiwi   Wellbutrin [Bupropion] Hives    ROS Review of Systems  Constitutional:  Negative for chills, diaphoresis and fever.  HENT:  Negative for congestion, hearing loss, nosebleeds, sore throat and tinnitus.   Eyes:  Negative for photophobia and redness.  Respiratory:  Positive for shortness of breath. Negative for cough, wheezing and stridor.   Cardiovascular:  Negative for chest pain, palpitations and leg swelling.  Gastrointestinal:  Negative for abdominal pain, blood in stool, constipation, diarrhea, nausea and vomiting.   Endocrine: Negative for polydipsia.  Genitourinary:  Negative for dysuria, flank pain, frequency, hematuria and urgency.  Musculoskeletal:  Positive for back pain, gait problem and myalgias. Negative for neck pain.  Skin:  Positive for rash.  Allergic/Immunologic: Negative for environmental allergies.  Neurological:  Positive for weakness and headaches. Negative for dizziness, tremors and seizures.  Hematological:  Does not bruise/bleed easily.  Psychiatric/Behavioral:  Negative for suicidal ideas. The patient is not nervous/anxious.      Objective:    Physical Exam Vitals reviewed.  Constitutional:      Appearance: Normal appearance. She is well-developed. She is obese. She is not diaphoretic.  HENT:     Head: Normocephalic and atraumatic.     Nose: No nasal deformity, septal deviation, mucosal edema or rhinorrhea.     Right Sinus: No maxillary sinus tenderness or frontal sinus tenderness.     Left Sinus: No maxillary sinus tenderness or frontal sinus tenderness.     Mouth/Throat:     Pharynx: No oropharyngeal exudate.  Eyes:     General: No scleral icterus.    Conjunctiva/sclera: Conjunctivae normal.     Pupils: Pupils are equal, round, and reactive to light.  Neck:     Thyroid: No thyromegaly.     Vascular: No carotid bruit or JVD.     Trachea: Trachea normal. No tracheal tenderness or tracheal deviation.  Cardiovascular:     Rate and Rhythm: Normal rate and regular rhythm.     Chest Wall: PMI is not displaced.     Pulses: Normal pulses. No decreased pulses.     Heart sounds: Normal heart sounds, S1 normal and S2 normal. Heart sounds not distant. No murmur heard. No systolic murmur is present.  No diastolic murmur is present.    No friction rub. No gallop. No S3 or S4 sounds.  Pulmonary:     Effort: Pulmonary effort is normal. No tachypnea, accessory muscle usage or respiratory distress.     Breath sounds: Normal breath sounds. No stridor. No decreased breath sounds,  wheezing, rhonchi or rales.  Chest:     Chest wall: No tenderness.  Abdominal:     General: Bowel sounds are normal. There is no distension.     Palpations: Abdomen is soft. Abdomen is not rigid.     Tenderness: There is no abdominal tenderness. There is no guarding or rebound.  Musculoskeletal:        General: Normal range of motion.     Cervical back: Normal range of motion and neck supple. No edema, erythema or rigidity. No muscular tenderness. Normal range of motion.  Lymphadenopathy:     Head:     Right side of head: No submental or submandibular adenopathy.     Left side of head: No submental or submandibular adenopathy.     Cervical: No cervical adenopathy.  Skin:    General: Skin is warm and dry.     Coloration: Skin is not pale.     Findings: No rash.     Nails: There is no clubbing.  Neurological:     Mental Status: She is alert and oriented to person, place, and time.     Cranial Nerves: Cranial nerves 2-12 are intact.     Sensory: No sensory deficit.     Motor: Weakness and abnormal muscle tone present.     Gait: Gait abnormal and tandem walk abnormal.     Comments: Giveaway weakness in the lower extremities  3/5 weakness seen bilaterally lower extremities  Psychiatric:        Speech: Speech normal.        Behavior: Behavior normal.    BP 137/81   Pulse (!) 109   Resp 16   SpO2 99%  Wt Readings from Last 3 Encounters:  05/23/21 289 lb 8 oz (131.3 kg)  01/01/21 279 lb (126.6 kg)  07/24/20 281 lb 3.2 oz (127.6 kg)     Health Maintenance Due  Topic Date Due   Pneumococcal Vaccine 76-61 Years old (2 - PCV) 04/04/2020   COVID-19 Vaccine (4 - Booster for Pfizer series) 08/15/2020   PAP SMEAR-Modifier  08/22/2020    There are no preventive care reminders to display for this patient.  Lab Results  Component Value Date   TSH 0.932 07/01/2021   Lab Results  Component Value Date   WBC 10.3 07/01/2021   HGB 13.5 07/01/2021   HCT 41.6 07/01/2021   MCV  90.6 07/01/2021   PLT 360 07/01/2021   Lab Results  Component Value Date   NA 136 07/01/2021   K 4.6 07/01/2021   CO2 20 (L) 07/01/2021   GLUCOSE 183 (H) 07/01/2021   BUN 10 07/01/2021   CREATININE 0.69 07/01/2021   BILITOT 0.5 07/01/2021   ALKPHOS 82 07/01/2021   AST 20 07/01/2021   ALT 17 07/01/2021   PROT 6.7 07/01/2021   ALBUMIN 3.5 07/01/2021   CALCIUM 8.9 07/01/2021   ANIONGAP 10 07/01/2021   Lab Results  Component Value Date   CHOL 175 01/24/2017   Lab Results  Component Value Date   HDL 43 01/24/2017   Lab Results  Component Value Date   LDLCALC 98 01/24/2017   Lab Results  Component Value Date   TRIG 171 (H) 01/24/2017   Lab Results  Component Value Date   CHOLHDL 4.1 01/24/2017   Lab Results  Component Value Date   HGBA1C 5.0 01/24/2017  MR Brain W and Wo Contrast   Result Date: 07/02/2021 CLINICAL DATA:  Demyelinating disease EXAM: MRI HEAD WITHOUT AND WITH CONTRAST TECHNIQUE: Multiplanar, multiecho pulse  sequences of the brain and surrounding structures were obtained without and with intravenous contrast. CONTRAST:  73mL GADAVIST GADOBUTROL 1 MMOL/ML IV SOLN COMPARISON:  None. FINDINGS: Brain: No acute infarct, mass effect or extra-axial collection. No acute or chronic hemorrhage. Normal white matter signal, parenchymal volume and CSF spaces. The midline structures are normal. There is no abnormal contrast enhancement. Vascular: Major flow voids are preserved. Skull and upper cervical spine: Normal calvarium and skull base. Visualized upper cervical spine and soft tissues are normal. Sinuses/Orbits:No paranasal sinus fluid levels or advanced mucosal thickening. No mastoid or middle ear effusion. Normal orbits. IMPRESSION: Normal brain MRI. Electronically Signed   By: Ulyses Jarred M.D.   On: 07/02/2021 03:31    MR CERVICAL SPINE W WO CONTRAST   Result Date: 07/02/2021 CLINICAL DATA:  Demyelinating disease EXAM: MRI CERVICAL SPINE WITHOUT AND WITH CONTRAST  TECHNIQUE: Multiplanar and multiecho pulse sequences of the cervical spine, to include the craniocervical junction and cervicothoracic junction, were obtained without and with intravenous contrast. CONTRAST:  23mL GADAVIST GADOBUTROL 1 MMOL/ML IV SOLN COMPARISON:  None. FINDINGS: Alignment: Physiologic. Vertebrae: No fracture, evidence of discitis, or bone lesion. Cord: Normal signal and morphology. Posterior Fossa, vertebral arteries, paraspinal tissues: Negative. Disc levels: No spinal canal or neural foraminal stenosis. IMPRESSION: Normal MRI of the cervical spine. No evidence of demyelinating disease.     Assessment & Plan:   Problem List Items Addressed This Visit       Other   Lower extremity weakness    Bilateral lower extremity weakness with concern regarding potential Guillain-Barr versus other post viral syndrome  Patient failed to stay in the emergency room for observation and further evaluations which would include thoracic and lumbar MRI  Patient is self-pay making it a barrier in getting rapid evaluations with neurology and rheumatology as an outpatient and getting the MRIs accomplished without significant financial toxicity  Patient's weakness is progressing and I am very concerned that she will have a bad outcome if she is not evaluated as an inpatient  I called the emergency room they do have 34 patients boarding waiting for beds and the patient is aware will take time but I told her to be patient and follow through in the process this is the best we can do at this situation  Patient was sent to the emergency room for further evaluations       No orders of the defined types were placed in this encounter.   Follow-up: No follow-ups on file.    Asencion Noble, MD

## 2021-07-08 ENCOUNTER — Observation Stay (HOSPITAL_COMMUNITY)
Admission: EM | Admit: 2021-07-08 | Discharge: 2021-07-09 | Disposition: A | Discharge status: Home / Self Care | Payer: Self-pay | Attending: Family Medicine | Admitting: Family Medicine

## 2021-07-08 ENCOUNTER — Encounter: Payer: Self-pay | Admitting: Critical Care Medicine

## 2021-07-08 ENCOUNTER — Other Ambulatory Visit: Payer: Self-pay

## 2021-07-08 ENCOUNTER — Encounter (HOSPITAL_COMMUNITY): Payer: Self-pay | Admitting: *Deleted

## 2021-07-08 ENCOUNTER — Emergency Department (HOSPITAL_COMMUNITY): Payer: Self-pay

## 2021-07-08 ENCOUNTER — Ambulatory Visit: Payer: Self-pay | Attending: Critical Care Medicine | Admitting: Critical Care Medicine

## 2021-07-08 VITALS — BP 137/81 | HR 109 | Resp 16

## 2021-07-08 DIAGNOSIS — Z20822 Contact with and (suspected) exposure to covid-19: Secondary | ICD-10-CM | POA: Insufficient documentation

## 2021-07-08 DIAGNOSIS — R531 Weakness: Secondary | ICD-10-CM

## 2021-07-08 DIAGNOSIS — B9789 Other viral agents as the cause of diseases classified elsewhere: Secondary | ICD-10-CM

## 2021-07-08 DIAGNOSIS — R29898 Other symptoms and signs involving the musculoskeletal system: Secondary | ICD-10-CM

## 2021-07-08 DIAGNOSIS — J4540 Moderate persistent asthma, uncomplicated: Secondary | ICD-10-CM | POA: Insufficient documentation

## 2021-07-08 DIAGNOSIS — M609 Myositis, unspecified: Secondary | ICD-10-CM | POA: Insufficient documentation

## 2021-07-08 DIAGNOSIS — R0602 Shortness of breath: Secondary | ICD-10-CM | POA: Insufficient documentation

## 2021-07-08 DIAGNOSIS — Z79899 Other long term (current) drug therapy: Secondary | ICD-10-CM | POA: Insufficient documentation

## 2021-07-08 DIAGNOSIS — M60009 Infective myositis, unspecified site: Secondary | ICD-10-CM

## 2021-07-08 HISTORY — DX: Other symptoms and signs involving the musculoskeletal system: R29.898

## 2021-07-08 HISTORY — DX: Weakness: R53.1

## 2021-07-08 LAB — CBC WITH DIFFERENTIAL/PLATELET
Abs Immature Granulocytes: 0.13 10*3/uL — ABNORMAL HIGH (ref 0.00–0.07)
Basophils Absolute: 0.1 10*3/uL (ref 0.0–0.1)
Basophils Relative: 1 %
Eosinophils Absolute: 0.3 10*3/uL (ref 0.0–0.5)
Eosinophils Relative: 3 %
HCT: 42.3 % (ref 36.0–46.0)
Hemoglobin: 13.5 g/dL (ref 12.0–15.0)
Immature Granulocytes: 1 %
Lymphocytes Relative: 25 %
Lymphs Abs: 3 10*3/uL (ref 0.7–4.0)
MCH: 28.8 pg (ref 26.0–34.0)
MCHC: 31.9 g/dL (ref 30.0–36.0)
MCV: 90.4 fL (ref 80.0–100.0)
Monocytes Absolute: 0.6 10*3/uL (ref 0.1–1.0)
Monocytes Relative: 5 %
Neutro Abs: 7.9 10*3/uL — ABNORMAL HIGH (ref 1.7–7.7)
Neutrophils Relative %: 65 %
Platelets: 366 10*3/uL (ref 150–400)
RBC: 4.68 MIL/uL (ref 3.87–5.11)
RDW: 14.3 % (ref 11.5–15.5)
WBC: 12.1 10*3/uL — ABNORMAL HIGH (ref 4.0–10.5)
nRBC: 0 % (ref 0.0–0.2)

## 2021-07-08 LAB — I-STAT BETA HCG BLOOD, ED (MC, WL, AP ONLY): I-stat hCG, quantitative: <5 m[IU]/mL (ref <5)

## 2021-07-08 LAB — BASIC METABOLIC PANEL
Anion gap: 8 (ref 5–15)
BUN: 10 mg/dL (ref 6–20)
CO2: 26 mmol/L (ref 22–32)
Calcium: 9.1 mg/dL (ref 8.9–10.3)
Chloride: 100 mmol/L (ref 98–111)
Creatinine, Ser: 0.74 mg/dL (ref 0.44–1.00)
GFR, Estimated: >60 mL/min (ref >60)
Glucose, Bld: 118 mg/dL — ABNORMAL HIGH (ref 70–99)
Potassium: 4.4 mmol/L (ref 3.5–5.1)
Sodium: 134 mmol/L — ABNORMAL LOW (ref 135–145)

## 2021-07-08 LAB — SEDIMENTATION RATE: Sed Rate: 8 mm/hr (ref 0–22)

## 2021-07-08 LAB — HIV ANTIBODY (ROUTINE TESTING W REFLEX): HIV Screen 4th Generation wRfx: NONREACTIVE

## 2021-07-08 LAB — RESP PANEL BY RT-PCR (FLU A&B, COVID) ARPGX2
Influenza A by PCR: NEGATIVE
Influenza B by PCR: NEGATIVE
SARS Coronavirus 2 by RT PCR: NEGATIVE

## 2021-07-08 LAB — VITAMIN D 25 HYDROXY (VIT D DEFICIENCY, FRACTURES): Vit D, 25-Hydroxy: 28.9 ng/mL — ABNORMAL LOW (ref 30–100)

## 2021-07-08 LAB — CK: Total CK: 33 U/L — ABNORMAL LOW (ref 38–234)

## 2021-07-08 LAB — C-REACTIVE PROTEIN: CRP: 2 mg/dL — ABNORMAL HIGH (ref <1.0)

## 2021-07-08 IMAGING — MR MR LUMBAR SPINE WO/W CM
4 of 7 series · 19 of 48 positions shown · IV contrast (gadavist)
Comparison: Chest CT [DATE]

CLINICAL DATA: Motor neuron disease

EXAM:
MRI THORACIC AND LUMBAR SPINE WITHOUT AND WITH CONTRAST
TECHNIQUE: Multiplanar and multiecho pulse sequences of the thoracic and lumbar
spine were obtained without and with intravenous contrast.
CONTRAST:  10mL GADAVIST GADOBUTROL 1 MMOL/ML IV SOLN

[Series 10: T2 · sagittal · 4.0mm · 0.55mm/px · 3 of 15 slices shown (1 of 2)]
[im 1/15]
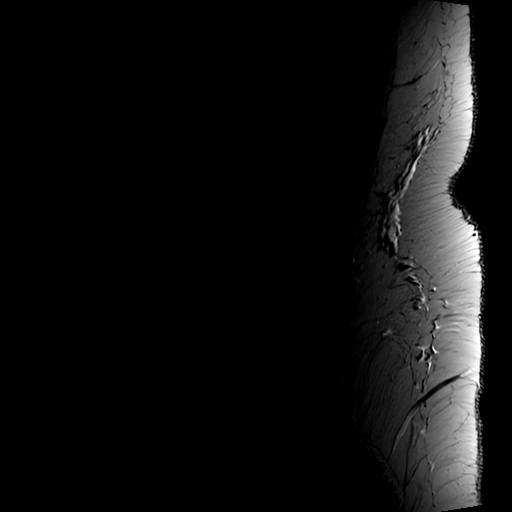
[im 8/15]
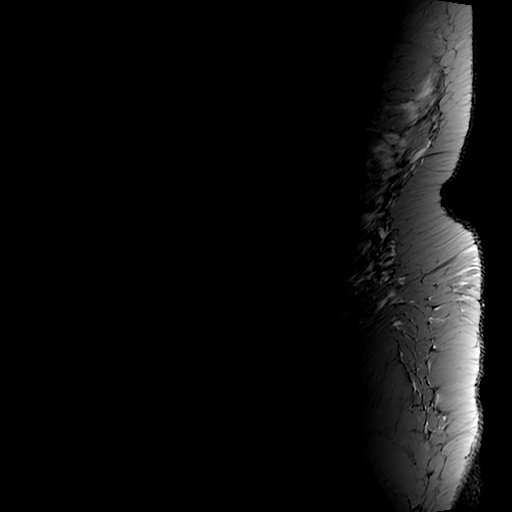
[im 15/15]
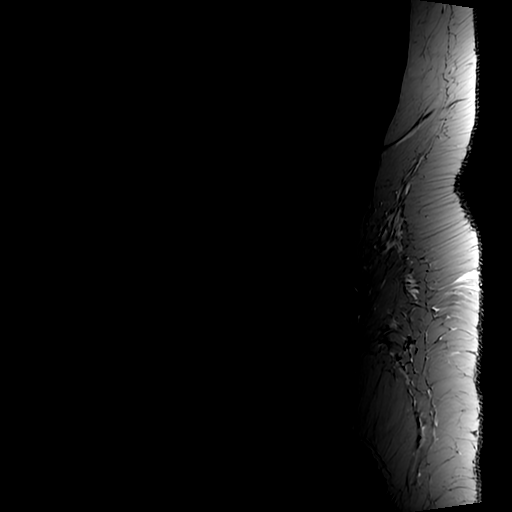

[Series 12: T1 · sagittal · 4.0mm · 0.55mm/px · 3 of 15 slices shown (1 of 2)]
[im 1/15]
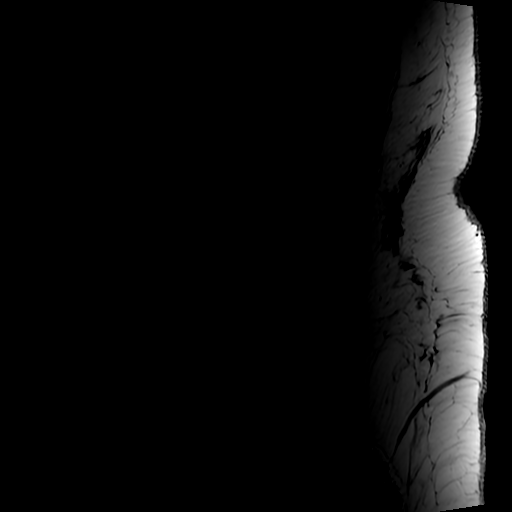
[im 10/15]
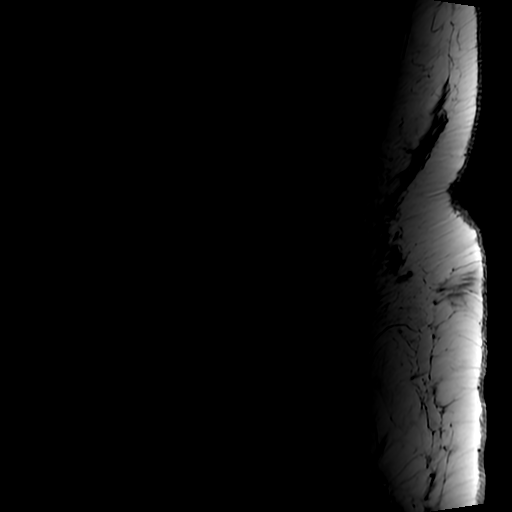
[im 15/15]
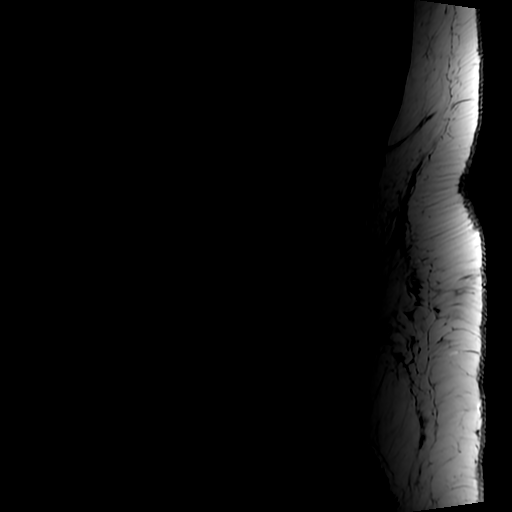

[Series 13: T2 · axial · 4.0mm · 0.39mm/px · z∈[-433,-232]mm · 10 of 39 slices shown (2 of 2)]
[im 1/39]
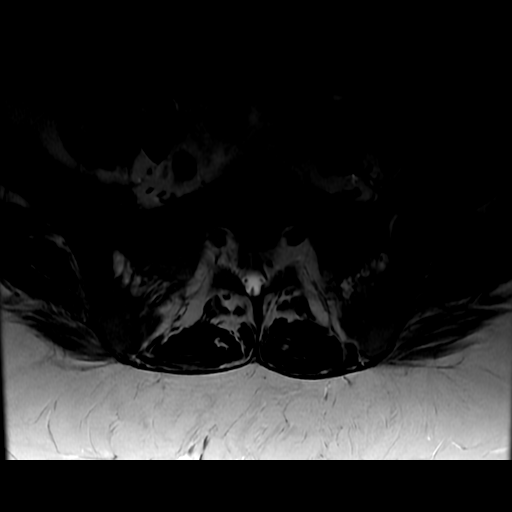
[im 4/39]
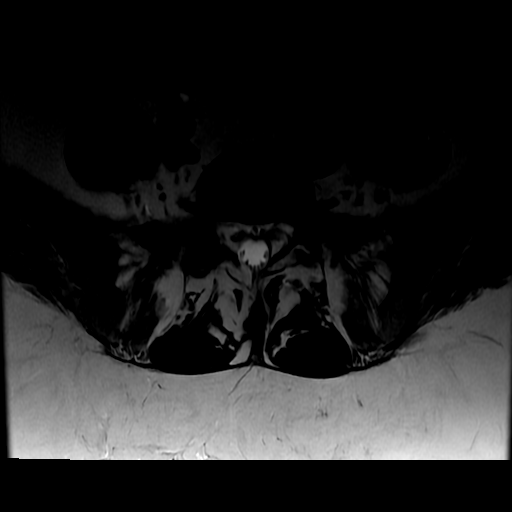
[im 8/39]
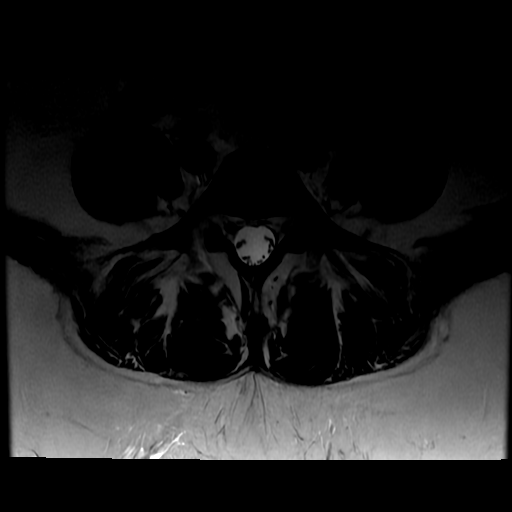
[im 12/39]
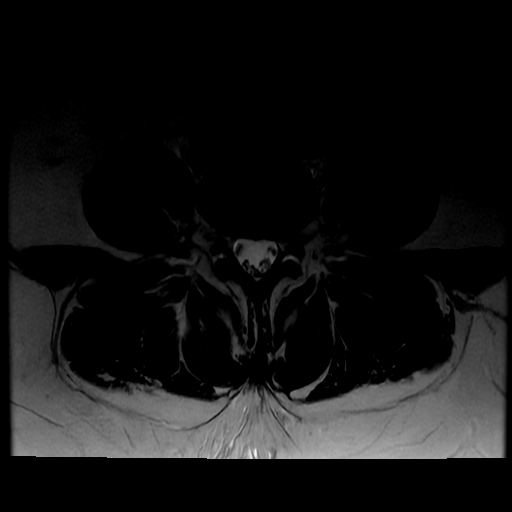
[im 16/39]
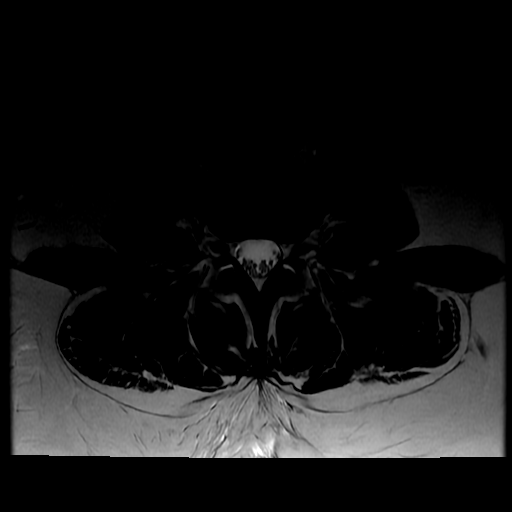
[im 20/39]
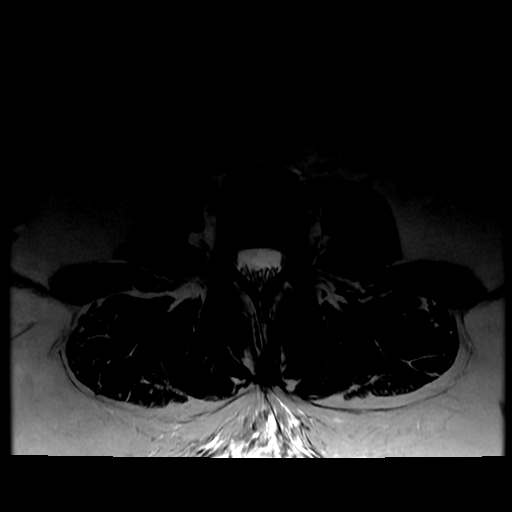
[im 23/39]
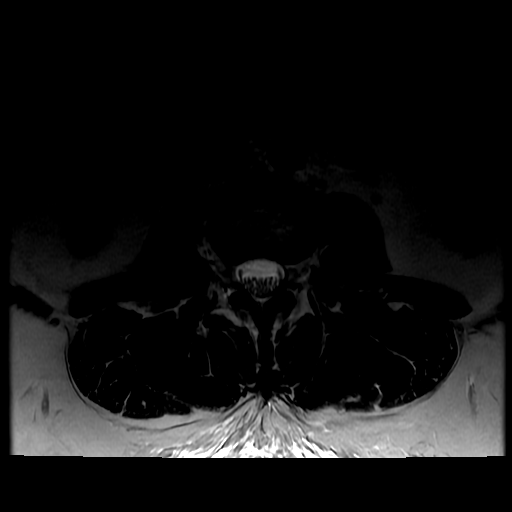
[im 27/39]
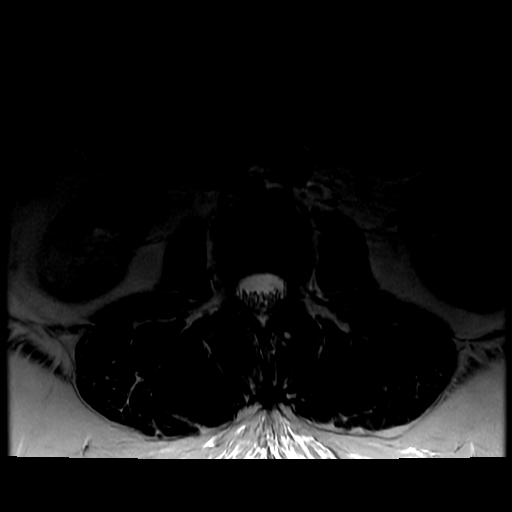
[im 31/39]
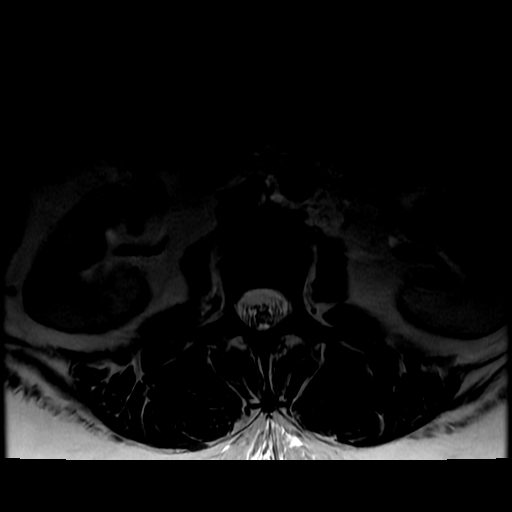
[im 35/39]
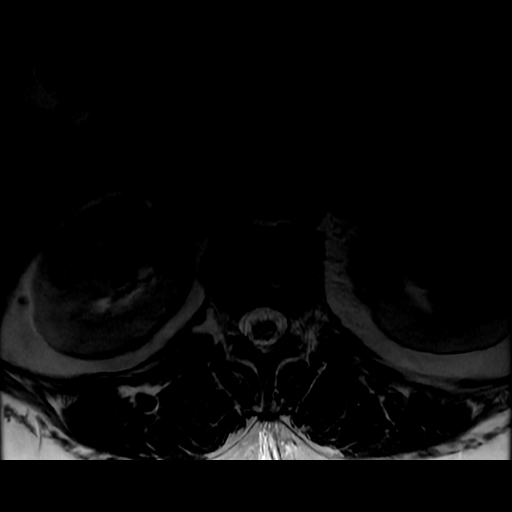

[Series 14: T1 · axial · 4.0mm · 0.39mm/px · z∈[-419,-232]mm · 3 of 39 slices shown (2 of 2)]
[im 4/39]
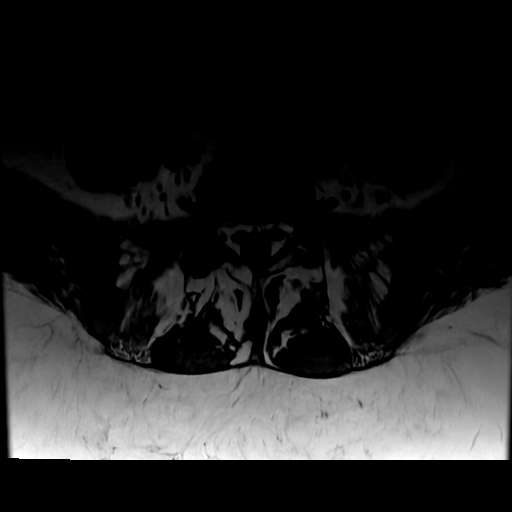
[im 20/39]
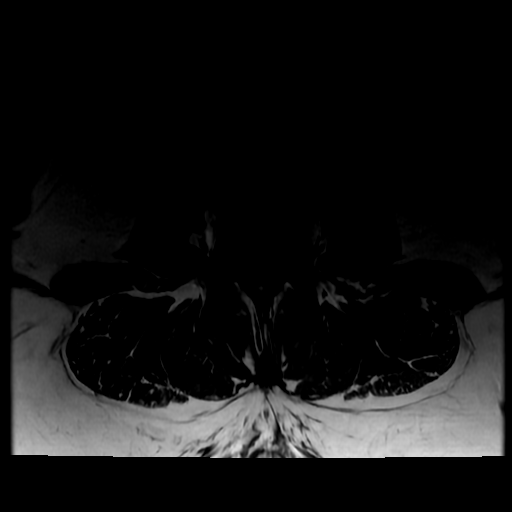
[im 35/39]
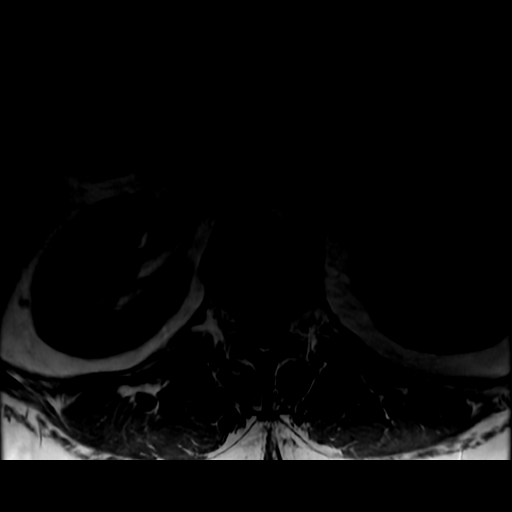

[19 of 48 positions shown; findings below may reference images not displayed]

FINDINGS: MRI THORACIC SPINE FINDINGS

There is respiratory motion artifact present which mildly degrades
image quality on axial sequences.

Alignment:  Physiologic.

Vertebrae:  No fracture, evidence of discitis, or bone lesion.

Cord: No abnormal cord signal.  No abnormal enhancement.

Paraspinal and other soft tissues: Negative

Disc levels: Evaluation of the disc levels are demonstrates no large
disc herniation, significant spinal canal or neural foraminal
narrowing.

MRI LUMBAR SPINE FINDINGS

Segmentation:  Standard.

Alignment:  Physiologic.

Vertebrae: No fracture, evidence of discitis, or aggressive bone
lesion.

Conus medullaris and cauda equina: Conus extends to the L1 level.
Conus and cauda equina appear normal. No abnormal enhancement.

Paraspinal and other soft tissues: Tiny bilateral renal cysts.

Disc levels:

T12-L1: No significant spinal canal or neural foraminal narrowing.

L1-L2: No significant spinal canal or neural foraminal narrowing.

L2-L3: No significant spinal canal or neural foraminal narrowing.

L3-L4: No significant spinal canal or neural foraminal narrowing.

L4-L5: No significant spinal canal or neural foraminal narrowing

L5-S1: No significant spinal canal or neural foraminal narrowing.
IMPRESSION: Normal MRI of the thoracic and lumbar spine.

## 2021-07-08 MED ORDER — LAMOTRIGINE 100 MG PO TABS
200.0000 mg | ORAL_TABLET | Freq: Two times a day (BID) | ORAL | Status: DC
  Administered 2021-07-08: 200 mg via ORAL
  Filled 2021-07-08 (×4): qty 2

## 2021-07-08 MED ORDER — MONTELUKAST SODIUM 10 MG PO TABS
10.0000 mg | ORAL_TABLET | Freq: Every day | ORAL | Status: DC
  Administered 2021-07-08: 10 mg via ORAL
  Filled 2021-07-08 (×2): qty 1

## 2021-07-08 MED ORDER — PANTOPRAZOLE SODIUM 40 MG PO TBEC
40.0000 mg | DELAYED_RELEASE_TABLET | Freq: Every day | ORAL | Status: DC
  Administered 2021-07-08 – 2021-07-09 (×2): 40 mg via ORAL
  Filled 2021-07-08 (×2): qty 1

## 2021-07-08 MED ORDER — ENOXAPARIN SODIUM 40 MG/0.4ML IJ SOSY
40.0000 mg | PREFILLED_SYRINGE | INTRAMUSCULAR | Status: DC
  Administered 2021-07-08: 40 mg via SUBCUTANEOUS
  Filled 2021-07-08: qty 0.4

## 2021-07-08 MED ORDER — THIAMINE HCL 100 MG PO TABS
100.0000 mg | ORAL_TABLET | Freq: Every day | ORAL | Status: DC
  Administered 2021-07-08: 100 mg via ORAL
  Filled 2021-07-08 (×2): qty 1

## 2021-07-08 MED ORDER — PRAMIPEXOLE DIHYDROCHLORIDE 1.5 MG PO TABS
1.5000 mg | ORAL_TABLET | Freq: Every day | ORAL | Status: DC
  Administered 2021-07-08: 1.5 mg via ORAL
  Filled 2021-07-08 (×2): qty 1

## 2021-07-08 MED ORDER — GADOBUTROL 1 MMOL/ML IV SOLN
10.0000 mL | Freq: Once | INTRAVENOUS | Status: AC | PRN
  Administered 2021-07-08: 10 mL via INTRAVENOUS

## 2021-07-08 MED ORDER — FLUTICASONE FUROATE-VILANTEROL 100-25 MCG/ACT IN AEPB
1.0000 | INHALATION_SPRAY | Freq: Every day | RESPIRATORY_TRACT | Status: DC
  Administered 2021-07-09: 1 via RESPIRATORY_TRACT
  Filled 2021-07-08: qty 28

## 2021-07-08 MED ORDER — ESCITALOPRAM OXALATE 10 MG PO TABS
20.0000 mg | ORAL_TABLET | Freq: Every day | ORAL | Status: DC
  Administered 2021-07-08: 20 mg via ORAL
  Filled 2021-07-08: qty 2

## 2021-07-08 MED ORDER — LORAZEPAM 2 MG/ML IJ SOLN
1.0000 mg | Freq: Once | INTRAMUSCULAR | Status: AC
  Administered 2021-07-08: 1 mg via INTRAVENOUS
  Filled 2021-07-08: qty 1

## 2021-07-08 MED ORDER — LORATADINE 10 MG PO TABS
10.0000 mg | ORAL_TABLET | Freq: Every day | ORAL | Status: DC
  Administered 2021-07-09: 10 mg via ORAL
  Filled 2021-07-08: qty 1

## 2021-07-08 NOTE — ED Triage Notes (Signed)
Pt was here on 11/28 for weakness, headache, sob, fatigue. Reports that she was asked to stay for obs but she signed out ama. Went to pcp today and encouraged to come back for admission due to possible guillaine barre. Airway intact. No resp distress noted.

## 2021-07-08 NOTE — Assessment & Plan Note (Signed)
Bilateral lower extremity weakness with concern regarding potential Guillain-Barr versus other post viral syndrome  Patient failed to stay in the emergency room for observation and further evaluations which would include thoracic and lumbar MRI  Patient is self-pay making it a barrier in getting rapid evaluations with neurology and rheumatology as an outpatient and getting the MRIs accomplished without significant financial toxicity  Patient's weakness is progressing and I am very concerned that she will have a bad outcome if she is not evaluated as an inpatient  I called the emergency room they do have 34 patients boarding waiting for beds and the patient is aware will take time but I told her to be patient and follow through in the process this is the best we can do at this situation  Patient was sent to the emergency room for further evaluations

## 2021-07-08 NOTE — H&P (Addendum)
[facility] Admission History and Physical Service Pager: [phone]  Patient name: [name] [name] Medical record number: [id number] Date of birth: [birth year] Age: 35 y.o. Gender: female  Primary Care Provider: [name], MD Consultants: Neurology Code Status: Full  Preferred Emergency Contact: Husband Adolm Joseph, [phone]  Chief Complaint: Weakness  Assessment and Plan: [name] [name] is a 35 y.o. female presenting with 10 days of weakness and myalgias. PMH is significant for moderate persistent asthma, Bipolar I disorder, chronic allergic rhinitis, Class 3 obesity.   Weakness  Body Aches  Muscle Tenderness 10 day history of weakness in proximal upper and lower extremities, body aches, and diffuse muscular tenderness. VSS on admission. Labs grossly normal. CK not elevated. TSH, Mag, and B12 all WNL on recent visit 11/29. MRI Lumbar and Thoracic spine without abnormality. Also of note, MRI Brain and C-Spine from 11/28 were normal with no evidence of demyelinating disease.  Neuro saw and did not believe symptoms were consistent with Guillain-Barre due to intact reflexes. Per their consult note, they believe symptoms and clinical course are most consistent with a post-viral myositis in the setting of recent viral illness.  Other items on the differential include rheumatologic etiologies, with special concern for polymyalgia rheumatica given proximal muscle weakness, however in the case of most rheumatologic etiologies would have expected at least modest improvement with steroid burst. Considered B12 deficiency given patient's vegan diet, but patient takes supplements and B12 was WNL on 11/28. If workup is unrevealing, consider functional etiology given patient's self-proclaimed sensitivity to stress, recent life changes (loss of employment) and history of trauma.  - Admit to med-surg, Dr. Andria Frames attending - Neurology following, appreciate  recommendations - Labs per neuro: ANA, ANCA, Anti-dsDNA, Anti-Smith, CRP, am Cortisol, CCP antibody, Lyme serology, rheumatoid factor, RPR, ESR, Vit D.  - PT/OT eval and treat - Continue home thiamine - Consult to [facility] for uninsured status  Bipolar Disorder  Home meds include Lamictal $RemoveBeforeDE'200mg'QcxsSounGfDBFfs$  BID and Lexapro $RemoveBef'20mg'TkQpfcpBeb$  QHS. No evidence of mania on interview. Mood and affect both WNL.  - Continue home meds  Asthma, moderate persistent  Allergic Rhinitis, chronic Home meds include albuterol, Advair, cetirizine, montelukast.  - Continue home medications with formulary substitutions where necessary (Advair > Breo, cetirizine > loratadine).  GERD - Continue home protonix   FEN/GI: Vegan Diet per patient preference Prophylaxis: Lovenox  Disposition: Med-Surg  History of Present Illness:  [name] [name] is a 35 y.o. female presenting with ascending muscle weakness since 11/25. She has been feeling poorly since 11/9, at that time she was diagnosed with sinusitis and received a prescription for a ten-day Augmentin course that did help resolve her URI symptoms. However, on 11/25 she began experiencing weakness of her BLE and BUE. She says that the weakness is worst in her proximal joints (hips/shoulders).  She reports becoming easily winded with even minimal exertion.  Of note, patient became out of breath during the strength testing portion of the neurological exam during my interview.  She also endorses diffuse soreness throughout her arms and legs. Her weakness advanced to the point that she could not climb the stairs to her apartment and required a rolling walker to ambulate on flat ground. She took a course of prednisone $RemoveBefor'50mg'NWbbYdbDlJzl$  daily that was prescribed to her on 11/27 with no change in symptoms.  Was seen in the ER on 11/28 for these symptoms.  MRI brain and C-spine at the time were negative for demyelinating disease.  Dr. Cheral Marker,  neurology, was consulted and requested MRIs of the lumbar and thoracic  spines but the patient left AMA prior to having these done. She reports nighttime fevers, though the highest temp she has recorded at home was 100. She also endorses mild headache that has been refractory to Tylenol/Ibuprofen, though she states that she has these at baseline. She endorses nausea and lightheadedness upon standing. Denies sensory changes, visual changes, numbness.  She states that she had COVID in 2020 and that since that time has experienced the onset of newly diagnoses Asthma and multiple viral infections. Covid negative on 11/28.  Patient reports that she "does not do well with stress" and recently lost her job due to calling in sick too often. She points to this as a current life stressor. She also endorses a history of trauma and anxiety that she attends therapy for.   Review Of Systems: Per HPI with the following additions:   Review of Systems  Constitutional:  Positive for activity change, fatigue and fever (subjective).  Respiratory:  Positive for shortness of breath. Negative for chest tightness.   Cardiovascular:  Negative for chest pain and leg swelling.  Musculoskeletal:  Positive for myalgias and neck stiffness.  Neurological:  Positive for weakness, light-headedness and headaches. Negative for dizziness, speech difficulty and numbness.  All other systems reviewed and are negative.   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   Asthma, moderate persistent 03/02/2019   Other allergic rhinitis 03/02/2019   Mold suspected exposure 03/02/2019    Past Medical History: Past Medical History:  Diagnosis Date   Anxiety     Depression    [employer] 06/30/2020   GERD (gastroesophageal reflux disease)    History of kidney stones    Pneumonitis 03/02/2019    Past Surgical History: Past Surgical History:  Procedure Laterality Date   CYSTOSCOPY WITH RETROGRADE PYELOGRAM, URETEROSCOPY AND STENT PLACEMENT Right 12/10/2016   Procedure: CYSTOSCOPY WITH RETROGRADE PYELOGRAM, URETEROSCOPY AND STONE REMOVAL;  Surgeon: Raynelle Bring, MD;  Location: [facility];  Service: Urology;  Laterality: Right;   WISDOM TOOTH EXTRACTION  at age 37    Social History: Social History   Tobacco Use   Smoking status: Never   Smokeless tobacco: Never  Vaping Use   Vaping Use: Never used  Substance Use Topics   Alcohol use: Yes    Alcohol/week: 1.0 standard drink    Types: 1 Glasses of wine per week    Comment: per week   Drug use: No   Please also refer to relevant sections of EMR.  Family History: Family History  Problem Relation Age of Onset   Depression Mother    Anxiety disorder Mother     Allergies and Medications: Allergies  Allergen Reactions   Other     Bananas, kiwi   Wellbutrin [Bupropion] Hives   No current facility-administered medications on file prior to encounter.   Current Outpatient Medications on File Prior to Encounter  Medication Sig Dispense Refill   acetaminophen (TYLENOL) 500 MG tablet Take 1,000 mg by mouth every 6 (six) hours as needed for moderate pain.     albuterol (VENTOLIN HFA)  108 (90 Base) MCG/ACT inhaler Inhale 2 puffs into the lungs every 6 (six) hours as needed for wheezing or shortness of breath. 18 g 1   benzonatate (TESSALON) 100 MG capsule Take 1-2 capsules (100-200 mg total) by mouth 3 (three) times daily as needed for cough. 60 capsule 0   cetirizine (ZYRTEC) 10 MG tablet Take 1 tablet (10 mg total) by mouth daily. 60 tablet 6   Cholecalciferol (VITAMIN D3) 50 MCG (2000 UT) TABS Take 1 oral tablet once a day 30 tablet 2   escitalopram (LEXAPRO) 20 MG tablet Take 1 tablet (20  mg total) by mouth at bedtime. 180 tablet 0   fluticasone (FLOVENT HFA) 110 MCG/ACT inhaler Inhale 2 puffs into the lungs 2 (two) times daily. 12 g 11   fluticasone-salmeterol (ADVAIR DISKUS) 100-50 MCG/ACT AEPB Inhale 1 puff into the lungs 2 (two) times daily. 1 each 0   guaiFENesin (MUCINEX PO) Take 1 capsule by mouth daily as needed (congestion).     ibuprofen (ADVIL) 200 MG tablet Take 600 mg by mouth every 6 (six) hours as needed for moderate pain.     ipratropium (ATROVENT) 0.06 % nasal spray Place 2 sprays into both nostrils 4 (four) times daily. As needed for nasal congestion, runny nose 15 mL 12   lamoTRIgine (LAMICTAL) 200 MG tablet Take 1 tablet (200 mg total) by mouth 2 (two) times daily. 360 tablet 0   lansoprazole (PREVACID) 30 MG capsule Take 1 capsule (30 mg total) by mouth daily at 12 noon. 180 capsule 0   mometasone (NASONEX) 50 MCG/ACT nasal spray Place 2 sprays into the nose daily. 1 each 12   montelukast (SINGULAIR) 10 MG tablet Take 1 tablet (10 mg total) by mouth at bedtime. 180 tablet 0   pramipexole (MIRAPEX) 1.5 MG tablet Take 1 tablet (1.5 mg total) by mouth at bedtime. 180 tablet 0   promethazine-dextromethorphan (PROMETHAZINE-DM) 6.25-15 MG/5ML syrup Take 5 mLs by mouth at bedtime as needed for cough. 100 mL 0   sodium chloride (OCEAN) 0.65 % SOLN nasal spray Place 1 spray into both nostrils as needed for congestion.     thiamine 100 MG tablet Take 100 mg by mouth daily.     budesonide (RHINOCORT AQUA) 32 MCG/ACT nasal spray Place 1 spray into both nostrils daily. (Patient not taking: Reported on 07/08/2021) 8.6 g 6   fluconazole (DIFLUCAN) 150 MG tablet Take 1 tablet today.  Take second tablet 3 days later. (Patient not taking: Reported on 07/08/2021) 2 tablet 0    Objective: BP 139/84 (BP Location: Right Arm)   Pulse 98   Temp 97.9 F (36.6 C) (Oral)   Resp 18   SpO2 98%  Exam: General: Alert and interactive, sitting up in ED chair, NAD Eyes: EOMs intact,  sclerae anicteric, conjunctivae without injection ENTM: MMM, no oral lesions  Neck: Supple, no thyromegaly Cardiovascular: RRR, no m/r/g Respiratory: Normal WOB on RA, lungs CTAB Gastrointestinal: Abdomen obese, non-tender MSK: Diffuse muscular tenderness across all four extremities Neuro: Sensation intact throughout, with the exception of diffuse allodynia as above, Strength 4/5 with knee flexion bilaterally, otherwise 5/5 throughout. CN II-XII intact Psych: Mood and affect normal  Labs and Imaging: CBC BMET  Recent Labs  Lab 07/08/21 1235  WBC 12.1*  HGB 13.5  HCT 42.3  PLT 366   Recent Labs  Lab 07/08/21 1235  NA 134*  K 4.4  CL 100  CO2 26  BUN 10  CREATININE 0.74  GLUCOSE 118*  CALCIUM 9.1     EKG: None obtained today  MR LUMBAR SPINE W WO CONTRAST MR THORACIC SPINE W WO CONTRAST  IMPRESSION: Normal MRI of the thoracic and lumbar spine.  Eppie Gibson, MD 07/08/2021, 7:45 PM PGY-1, [facility] pager: [phone], text pages welcome  FPTS Upper-Level Resident Addendum   I have independently interviewed and examined the patient. I have discussed the above with Dr. Joelyn Oms and agree with the documented plan. My edits for correction/addition/clarification are included above. Please see any attending notes.   Alcus Dad, MD PGY-2, [facility] 07/09/2021 1:05 AM  FPTS Service pager: [phone] (text pages welcome through [facility])

## 2021-07-08 NOTE — ED Provider Notes (Signed)
Emergency Medicine Provider Triage Evaluation Note  [name] [name] , a 35 y.o. female  was evaluated in triage.  Pt complains of concern for guillan barre. Send by pcp, Dr. Delford Field.  Review of Systems  Positive: weakness Negative: vomiting  Physical Exam  BP 139/84 (BP Location: Right Arm)   Pulse 98   Temp 97.9 F (36.6 C) (Oral)   Resp 18   SpO2 98%  Gen:   Awake, no distress   Resp:  Normal effort  MSK:   Moves extremities without difficulty  Other:  Clear speech  Medical Decision Making  Medically screening exam initiated at 12:24 PM.  Appropriate orders placed.  [name] [name] was informed that the remainder of the evaluation will be completed by another provider, this initial triage assessment does not replace that evaluation, and the importance of remaining in the ED until their evaluation is complete.     Rayne Du 07/08/21 1224    Benjiman Core, MD 07/09/21 [phone]

## 2021-07-08 NOTE — Patient Instructions (Signed)
Go to the [facility] emergency department for continued evaluation of your motor weakness  The charge nurse is aware of your condition and will see you when you arrive  Please be advised there are 34 people waiting in the emergency department for admission which means that it will take some time for them to process you and get you a bed in the hospital  They will get you triage check your vital signs and assess you and will move you up the list depending upon your status today we will have neurology see you again

## 2021-07-08 NOTE — ED Provider Notes (Signed)
[facility] EMERGENCY DEPARTMENT Provider Note   CSN: [id number] Arrival date & time: 07/08/21  1032     History Chief Complaint  Patient presents with   Weakness    [name] [name] is a 35 y.o. female.  Pt presents to the ED today from [facility].  She has had progressive weaknessfor a few weeks.  She started with a URI and was on Augmentin.  She developed a rash and took a round of steroids.  Pt came to the ED on 11/28 and was evaluated and a MRI was ordered.  She had a MRI brain and cervical spine which was nl.  Neurology did see her in consult and Dr. Cheral Marker recommended MRI thoracic and lumbar spine. She left AMA prior to that getting done.  She has ascending weakness and can't walk up the stairs in her house.  She has to walk with a walker.  All her muscles feel sore.  She has been googling the possible causes of her sx.        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     Family History  Problem Relation Age of Onset   Depression Mother    Anxiety disorder Mother     Social History   Tobacco Use   Smoking status: Never   Smokeless tobacco: Never  Vaping Use   Vaping Use: Never used  Substance Use Topics   Alcohol use: Yes    Alcohol/week: 1.0 standard drink    Types: 1 Glasses of wine per week    Comment: per week   Drug use: No    Home Medications Prior to Admission medications   Medication Sig Start Date End Date Taking? Authorizing Provider  acetaminophen (TYLENOL) 500 MG tablet Take 1,000 mg by mouth every 6 (six) hours as needed for moderate pain.   Yes [provider]  albuterol (VENTOLIN HFA) 108 (90 Base) MCG/ACT inhaler Inhale 2 puffs into the lungs every 6 (six) hours as needed for wheezing or shortness of breath. 06/26/21  Yes Lynden Oxford Scales, PA-C  benzonatate (TESSALON) 100 MG capsule Take 1-2 capsules (100-200 mg total) by mouth 3 (three) times daily as needed for cough. 06/30/21  Yes Jaynee Eagles, PA-C  cetirizine (ZYRTEC) 10 MG tablet Take 1 tablet (10 mg total) by mouth daily. 06/12/21  Yes [name], MD  Cholecalciferol (VITAMIN  D3) 50 MCG (2000 UT) TABS Take 1 oral tablet once a day 05/14/21  Yes   escitalopram (LEXAPRO) 20 MG tablet Take 1 tablet (20 mg total) by mouth at bedtime. 06/26/21 12/23/21 Yes Lynden Oxford Scales, PA-C  fluticasone (FLOVENT HFA) 110 MCG/ACT inhaler Inhale 2 puffs into the lungs 2 (two) times daily. 06/12/21  Yes [name], MD  fluticasone-salmeterol (ADVAIR DISKUS) 100-50 MCG/ACT AEPB Inhale 1 puff into the lungs 2 (two) times daily. 06/26/21  Yes Lynden Oxford Scales, PA-C  guaiFENesin (MUCINEX PO) Take 1 capsule by mouth daily as needed (congestion).   Yes [provider]  ibuprofen (ADVIL) 200 MG tablet Take 600 mg by mouth every 6 (six) hours as needed for moderate pain.   Yes [provider]  ipratropium (ATROVENT) 0.06 % nasal spray Place 2 sprays into both nostrils 4 (four) times daily. As needed for nasal congestion, runny nose 06/26/21  Yes Lynden Oxford Scales, PA-C  lamoTRIgine (LAMICTAL) 200 MG tablet Take 1 tablet (200 mg total) by mouth 2 (two) times daily. 06/26/21 12/23/21 Yes Lynden Oxford Scales, PA-C  lansoprazole (PREVACID) 30 MG capsule Take 1 capsule (30 mg total) by mouth daily at 12 noon. 06/26/21 12/23/21 Yes Lynden Oxford Scales, PA-C  mometasone (NASONEX) 50 MCG/ACT nasal spray Place 2 sprays into the nose daily. 06/26/21  Yes Lynden Oxford Scales, PA-C  montelukast (SINGULAIR) 10 MG tablet Take 1 tablet (10 mg total) by mouth at bedtime. 06/26/21 12/23/21 Yes Lynden Oxford Scales, PA-C  pramipexole (MIRAPEX) 1.5 MG tablet Take 1 tablet (1.5 mg total) by mouth at bedtime. 06/26/21 12/23/21 Yes Lynden Oxford Scales, PA-C  promethazine-dextromethorphan (PROMETHAZINE-DM) 6.25-15 MG/5ML syrup Take 5 mLs by mouth at bedtime as needed for cough. 06/30/21  Yes Jaynee Eagles, PA-C  sodium chloride (OCEAN) 0.65 % SOLN nasal spray Place 1 spray into both nostrils as needed for congestion.   Yes [provider]  thiamine 100 MG tablet Take 100 mg by mouth daily.   Yes [provider]  budesonide (RHINOCORT AQUA) 32 MCG/ACT nasal spray Place 1 spray into both nostrils daily. Patient not taking: Reported on 07/08/2021 06/12/21   [name], MD  fluconazole (DIFLUCAN) 150 MG tablet Take 1 tablet today.  Take second tablet 3 days later. Patient not taking: Reported on 07/08/2021 06/26/21   Lynden Oxford Scales, PA-C    Allergies    Other and Wellbutrin [bupropion]  Review of Systems   Review of Systems  Musculoskeletal:  Positive for arthralgias.  Neurological:  Positive for weakness.  All other systems reviewed and are negative.  Physical Exam Updated Vital Signs BP 139/84 (BP Location: Right Arm)   Pulse 98   Temp 97.9 F (36.6 C)  (Oral)   Resp 18   SpO2 98%   Physical Exam Vitals and nursing note reviewed.  Constitutional:      Appearance: Normal appearance. She is obese.  HENT:     Head: Normocephalic and atraumatic.     Right Ear: External ear normal.     Left Ear: External ear normal.     Nose: Nose normal.     Mouth/Throat:     Mouth: Mucous membranes are moist.     Pharynx: Oropharynx is clear.  Eyes:     Extraocular Movements: Extraocular movements intact.     Conjunctiva/sclera: Conjunctivae normal.     Pupils: Pupils are equal, round, and reactive to light.  Cardiovascular:     Rate and Rhythm: Normal rate and  regular rhythm.     Pulses: Normal pulses.     Heart sounds: Normal heart sounds.  Pulmonary:     Effort: Pulmonary effort is normal.     Breath sounds: Normal breath sounds.  Abdominal:     General: Abdomen is flat. Bowel sounds are normal.     Palpations: Abdomen is soft.  Musculoskeletal:        General: Normal range of motion.     Cervical back: Normal range of motion and neck supple.  Skin:    General: Skin is warm.     Capillary Refill: Capillary refill takes less than 2 seconds.  Neurological:     General: No focal deficit present.     Mental Status: She is alert and oriented to person, place, and time.     Motor: Weakness present.  Psychiatric:        Mood and Affect: Mood normal.        Behavior: Behavior normal.    ED Results / Procedures / Treatments   Labs (all labs ordered are listed, but only abnormal results are displayed) Labs Reviewed  CBC WITH DIFFERENTIAL/PLATELET - Abnormal; Notable for the following components:      Result Value   WBC 12.1 (*)    Neutro Abs 7.9 (*)    Abs Immature Granulocytes 0.13 (*)    All other components within normal limits  BASIC METABOLIC PANEL - Abnormal; Notable for the following components:   Sodium 134 (*)    Glucose, Bld 118 (*)    All other components within normal limits  CK - Abnormal; Notable for the following  components:   Total CK 33 (*)    All other components within normal limits  RESP PANEL BY RT-PCR (FLU A&B, COVID) ARPGX2  SEDIMENTATION RATE  C-REACTIVE PROTEIN  ANA W/REFLEX IF POSITIVE  RHEUMATOID FACTOR  VITAMIN D 25 HYDROXY (VIT D DEFICIENCY, FRACTURES)  ANCA TITERS  CYCLIC CITRUL PEPTIDE ANTIBODY, IGG/IGA  ANTI-SMITH ANTIBODY  ANTI-DNA ANTIBODY, DOUBLE-STRANDED  CORTISOL-AM, BLOOD  RPR  LYME DISEASE SEROLOGY W/REFLEX  I-STAT BETA HCG BLOOD, ED ([facility], [facility], [facility] ONLY)    EKG None  Radiology MR THORACIC SPINE W WO CONTRAST  Result Date: 07/08/2021 CLINICAL DATA:  Motor neuron disease EXAM: MRI THORACIC AND LUMBAR SPINE WITHOUT AND WITH CONTRAST TECHNIQUE: Multiplanar and multiecho pulse sequences of the thoracic and lumbar spine were obtained without and with intravenous contrast. CONTRAST:  83m GADAVIST GADOBUTROL 1 MMOL/ML IV SOLN COMPARISON:  Chest CT 03/15/2019 FINDINGS: MRI THORACIC SPINE FINDINGS There is respiratory motion artifact present which mildly degrades image quality on axial sequences. Alignment:  Physiologic. Vertebrae:  No fracture, evidence of discitis, or bone lesion. Cord: No abnormal cord signal.  No abnormal enhancement. Paraspinal and other soft tissues: Negative Disc levels: Evaluation of the disc levels are demonstrates no large disc herniation, significant spinal canal or neural foraminal narrowing. MRI LUMBAR SPINE FINDINGS Segmentation:  Standard. Alignment:  Physiologic. Vertebrae: No fracture, evidence of discitis, or aggressive bone lesion. Conus medullaris and cauda equina: Conus extends to the L1 level. Conus and cauda equina appear normal. No abnormal enhancement. Paraspinal and other soft tissues: Tiny bilateral renal cysts. Disc levels: T12-L1: No significant spinal canal or neural foraminal narrowing. L1-L2: No significant spinal canal or neural foraminal narrowing. L2-L3: No significant spinal canal or neural foraminal narrowing. L3-L4: No significant  spinal canal or neural foraminal narrowing. L4-L5: No significant spinal canal or neural foraminal narrowing L5-S1: No significant spinal canal or  neural foraminal narrowing. IMPRESSION: Normal MRI of the thoracic and lumbar spine. Electronically Signed   By: Maurine Simmering M.D.   On: 07/08/2021 16:45   MR Lumbar Spine W Wo Contrast  Result Date: 07/08/2021 CLINICAL DATA:  Motor neuron disease EXAM: MRI THORACIC AND LUMBAR SPINE WITHOUT AND WITH CONTRAST TECHNIQUE: Multiplanar and multiecho pulse sequences of the thoracic and lumbar spine were obtained without and with intravenous contrast. CONTRAST:  77m GADAVIST GADOBUTROL 1 MMOL/ML IV SOLN COMPARISON:  Chest CT 03/15/2019 FINDINGS: MRI THORACIC SPINE FINDINGS There is respiratory motion artifact present which mildly degrades image quality on axial sequences. Alignment:  Physiologic. Vertebrae:  No fracture, evidence of discitis, or bone lesion. Cord: No abnormal cord signal.  No abnormal enhancement. Paraspinal and other soft tissues: Negative Disc levels: Evaluation of the disc levels are demonstrates no large disc herniation, significant spinal canal or neural foraminal narrowing. MRI LUMBAR SPINE FINDINGS Segmentation:  Standard. Alignment:  Physiologic. Vertebrae: No fracture, evidence of discitis, or aggressive bone lesion. Conus medullaris and cauda equina: Conus extends to the L1 level. Conus and cauda equina appear normal. No abnormal enhancement. Paraspinal and other soft tissues: Tiny bilateral renal cysts. Disc levels: T12-L1: No significant spinal canal or neural foraminal narrowing. L1-L2: No significant spinal canal or neural foraminal narrowing. L2-L3: No significant spinal canal or neural foraminal narrowing. L3-L4: No significant spinal canal or neural foraminal narrowing. L4-L5: No significant spinal canal or neural foraminal narrowing L5-S1: No significant spinal canal or neural foraminal narrowing. IMPRESSION: Normal MRI of the thoracic and  lumbar spine. Electronically Signed   By: JMaurine SimmeringM.D.   On: 07/08/2021 16:45    Procedures Procedures   Medications Ordered in ED Medications  LORazepam (ATIVAN) injection 1 mg (1 mg Intravenous Given 07/08/21 1442)  gadobutrol (GADAVIST) 1 MMOL/ML injection 10 mL (10 mLs Intravenous Contrast Given 07/08/21 1555)    ED Course  I have reviewed the triage vital signs and the nursing notes.  Pertinent labs & imaging results that were available during my care of the patient were reviewed by me and considered in my medical decision making (see chart for details).    MDM Rules/Calculators/A&P                           MRI thoracic and lumbar neg.  Pt d/w Dr. KLorrin Goodell(neurology) who did come see her in the ED.  He recommended getting CRP, ESR, ANA, RF, Vit D and pt/ot eval.  Pt does have normal reflexes, so GBS is unlikely.  She may have viral myositis/myalgia.  Pt d/w FP for admission.  Final Clinical Impression(s) / ED Diagnoses Final diagnoses:  Weakness  Viral myositis    Rx / DC Orders ED Discharge Orders     None        HIsla Pence MD 07/08/21 2028

## 2021-07-08 NOTE — Consult Note (Signed)
Neurology consult H&P  CC: diffuse bilateral myalgias of lower greater than upper extremities and subjective weakness, difficulty walking   History is obtained from:Patient and husband along with chart review  HPI: [name] [name] is a 35 y.o. female with PMH significant for moderate persistent asthma, allergic rhinitis, and severe obesity who presents with bilateral upper and lower extremity weakness and pain which onset Nov 23rd and became the worst around Nov 26th prompting urgent care visit where she was diagnosed with upper respiratory virus with associated neck and chest rash which was red pruritic and painful (flu and mono negative). She was given prednisone but it did not help.  At that point she became unable to walk on her own due to subjective weakness. She was given a walker by a friend and began to use it to get around the house. She was unable to use the stairs in her home for about a week. She had some mild improvement after that but has still been unable to get around without that the walker and describes the weakness as stable since that time. Diffuse pain in her upper arms and bilat lower extremities has persisted. Rest relieves the pain. She reports she is sore to touch in arms and legs diffusely with some pain in legs activated by walking.  Additionally she reports asthma has been worse over the past few months along with 3-4 sinus infections since September. Generalized malaise and brain fog have been present since around Oct 20th. Received flu vaccine Nov 9th. Has had Covid x3 vaccine [employer]) with most recent April 2021. She was covid negative today and in November 28th. CXR 11/27 minimal bibasilar atelectasis.  She reports low grade fever 99-100 for the past week. She left from the ED last week after waiting 18 hours.  She reports she is vegan, takes B complex vitamins, MVI and vitamin D daily.  She has lost her job due to calling out sick during the last few months.   ROS: A  complete ROS was performed and is negative except as noted in the HPI.   Past Medical History:  Diagnosis Date   Anxiety    Depression    Phreesia 06/30/2020   GERD (gastroesophageal reflux disease)    History of kidney stones    Pneumonitis 03/02/2019    Family History  Problem Relation Age of Onset   Depression Mother    Anxiety disorder Mother    Social History:  reports that she has never smoked. She has never used smokeless tobacco. She reports current alcohol use of about 1.0 standard drink per week. She reports that she does not use drugs.   Prior to Admission medications   Medication Sig Start Date End Date Taking? Authorizing Provider  albuterol (VENTOLIN HFA) 108 (90 Base) MCG/ACT inhaler Inhale 2 puffs into the lungs every 6 (six) hours as needed for wheezing or shortness of breath (Cough). 06/26/21   Lynden Oxford Scales, PA-C  albuterol (VENTOLIN HFA) 108 (90 Base) MCG/ACT inhaler Inhale 2 puffs into the lungs every 6 (six) hours as needed for wheezing or shortness of breath. 06/26/21   Lynden Oxford Scales, PA-C  benzonatate (TESSALON) 100 MG capsule Take 1-2 capsules (100-200 mg total) by mouth 3 (three) times daily as needed for cough. 06/30/21   Jaynee Eagles, PA-C  budesonide (RHINOCORT AQUA) 32 MCG/ACT nasal spray Place 1 spray into both nostrils daily. 06/12/21   [name], MD  cetirizine (ZYRTEC) 10 MG tablet Take 1 tablet (10  mg total) by mouth daily. 06/12/21   [name], MD  Cholecalciferol (VITAMIN D3) 50 MCG (2000 UT) TABS Take 1 oral tablet once a day 05/14/21     escitalopram (LEXAPRO) 20 MG tablet Take 1 tablet (20 mg total) by mouth at bedtime. 06/26/21 12/23/21  Lynden Oxford Scales, PA-C  fluconazole (DIFLUCAN) 150 MG tablet Take 1 tablet today.  Take second tablet 3 days later. 06/26/21   Lynden Oxford Scales, PA-C  fluticasone (FLOVENT HFA) 110 MCG/ACT inhaler Inhale 2 puffs into the lungs 2 (two) times daily. 06/12/21   [name], MD  fluticasone-salmeterol (ADVAIR DISKUS) 100-50 MCG/ACT AEPB Inhale 1 puff into the lungs 2 (two) times daily. 06/26/21   Lynden Oxford Scales, PA-C  ipratropium (ATROVENT) 0.06 % nasal spray Place 2 sprays into both nostrils 4 (four) times daily. As needed for nasal congestion, runny nose 06/26/21   Lynden Oxford Scales, PA-C  lamoTRIgine (LAMICTAL) 200 MG tablet Take 1 tablet (200 mg total) by mouth 2 (two) times daily. 06/26/21 12/23/21  Lynden Oxford Scales, PA-C  lansoprazole (PREVACID) 30 MG capsule Take 1 capsule (30 mg total) by mouth daily at 12 noon. 06/26/21 12/23/21  Lynden Oxford Scales, PA-C  mometasone (NASONEX) 50 MCG/ACT nasal spray Place 2 sprays into the nose daily. 06/26/21   Lynden Oxford Scales, PA-C  montelukast (SINGULAIR) 10 MG tablet Take 1 tablet (10 mg total) by mouth at bedtime. 06/26/21 12/23/21  Lynden Oxford Scales, PA-C  pramipexole (MIRAPEX) 1.5 MG tablet Take 1 tablet (1.5 mg total) by mouth at bedtime. 06/26/21 12/23/21  Lynden Oxford Scales, PA-C  promethazine-dextromethorphan (PROMETHAZINE-DM) 6.25-15 MG/5ML syrup Take 5 mLs by mouth at bedtime as needed for cough. 06/30/21   Jaynee Eagles, PA-C  thiamine 100 MG tablet Take 100 mg by mouth daily.    [provider]    Exam: Current vital signs: BP 139/84 (BP Location: Right Arm)   Pulse 98   Temp 97.9 F (36.6 C) (Oral)   Resp 18   SpO2 98%   Physical Exam  Constitutional: Appears well-developed and well-nourished.  Psych: Affect appropriate to situation Eyes: No scleral injection HENT: No OP obstrucion Head: Normocephalic.  Cardiovascular: Normal rate and regular rhythm.  Respiratory: Effort normal and breath sounds normal to anterior ascultation GI: Soft.  No distension. There is no tenderness.  Skin: WDI  Neuro: Mental Status: Patient is awake, alert, oriented to person, place, month, year, and situation. Patient is able to give a clear and coherent history. No  signs of aphasia or neglect Speech/Language: Cranial Nerves: II: Visual Fields are full. Pupils are equal, round, and reactive to light.  III,IV, VI: EOMI without ptosis or diploplia.  V: Facial sensation is symmetric to temperature VII: Facial movement is symmetric.  VIII: hearing is intact to voice X: Uvula elevates symmetrically XI: Shoulder shrug is symmetric. XII: tongue is midline without atrophy or fasciculations.  Motor: Tone is normal. Bulk is normal. Tenderness to palpation in muscles in arms and legs BL. 5/5 strength was present in all four extremities. Sensory: Sensation is symmetric to light touch and temperature in the arms and legs. +Alloydynia bilateral upper and lower extremities diffusely  Deep Tendon Reflexes: 2+ and symmetric in the biceps and patellae.  Plantars: Toes are downgoing bilaterally.  Cerebellar: FNF and HKS are intact bilaterally.   CK 47 a week ago and today is 33 Lab Results  Component Value Date   WBC 12.1 (H) 07/08/2021   HGB 13.5 07/08/2021  HCT 42.3 07/08/2021   MCV 90.4 07/08/2021   PLT 366 07/08/2021   Lab Results  Component Value Date   NA 134 (L) 07/08/2021   K 4.4 07/08/2021   CO2 26 07/08/2021   GLUCOSE 118 (H) 07/08/2021   BUN 10 07/08/2021   CREATININE 0.74 07/08/2021   CALCIUM 9.1 07/08/2021   GFRNONAA >60 07/08/2021    Covid-19 Nucleic Acid Test Results Lab Results  Component Value Date   SARSCOV2NAA NEGATIVE 07/08/2021   SARSCOV2NAA NEGATIVE 07/01/2021   [place] NEGATIVE 03/08/2019   [place] Not Detected 01/31/2019    Lab Results  Component Value Date   TSH 0.932 07/01/2021   Lab Results  Component Value Date   MG 2.2 07/01/2021    Lab Results  Component Value Date   VITAMINB12 360 07/01/2021     Negative pregnancy test today  Imaging:  07/08/2021 MRI THORACIC AND LUMBAR SPINE WITHOUT AND WITH CONTRAST  IMPRESSION: Normal MRI of the thoracic and lumbar spine.  Assessment: [name] [name] is a 35 y.o. female with PMH significant for moderate persistent asthma, allergic rhinitis, and severe obesity who presents with bilateral upper and lower extremity weakness and pain which onset Nov 23rd and became the worst around Nov 26th prompting urgent care visit where she was diagnosed with upper respiratory virus with associated neck and chest rash which was red pruritic and painful (flu and mono negative). She was finished a course of augmentin and prednisone but it did not help. At that point she became unable to walk on her own due to subjective weakness. She was given a walker by a friend and began to use it to get around the house. She was unable to use the stairs in her home for about a week. She had some mild improvement after that but has still been unable to get around without that the walker and describes the weakness as stable since that time. Diffuse pain in her upper arms and bilat lower extremities has persisted. Rest relieves the pain. She reports she is sore to touch in arms and legs diffusely with some pain in legs activated by walking.   Impression:  Diffuse myalgias with subjective bilat lower extremity weakness, difficulty walking in the setting of recent upper respiratory viral infection which may be post viral myalgias in the setting of a viral infection. Alternatively could be inflammatory myositis or secondary to systemic rheumatologic disorder vs non inflammatory pain syndrome including fibromyalgia or chronic fatigue syndrome. No obvious medications that could be causing this.  Plan: - Recommend PT and OT. - ESR, CRP, CK, Aldolase, RF, Anti CCP Ab, ANA, DSDNA, Anti Smith Ab, ANCA to evaluate for autoimmune/systemic causes of myalgias. - Vit D levels, TSH, Cortisol to evaluate for endocrinologic etiology of her myalgias. - Serum Lyme Ab and RPR to evaluate for Lyme and potential Syphilis.  This plan of care was directed by Dr. Smitty Cords,  NP-C 07/08/2021, 5:51 PM   NEUROHOSPITALIST ADDENDUM Performed a face to face diagnostic evaluation.   I have reviewed the contents of history and physical exam as documented by PA/ARNP/Resident and agree with above documentation.  I have discussed and formulated the above plan as documented. Edits to the note have been made as needed.  Impression/Key exam findings/Plan: On individually testing all the muscle groups, her strength is intact. I suspect that she has diffuse myalgias rather than a true muscle weakness. She has tenderness to palpation in pretty much all muscle groups. Unlikely for  this to be GBS given intact reflexes and tenderness to palpation of muscles. I suspect that this is likely post viral myalgias/myositis which is usually mediated by release of cytokines related to viral illness. Will rule out other lesser known causes as above. Reviewed MRI Brain, C, T, L spine with no obvious cause for symptoms.   Donnetta Simpers, MD [facility] [phone]   If 7pm to 7am, please call on call as listed on AMION.

## 2021-07-09 LAB — BRAIN NATRIURETIC PEPTIDE: B Natriuretic Peptide: 20.5 pg/mL (ref 0.0–100.0)

## 2021-07-09 LAB — CORTISOL-AM, BLOOD: Cortisol - AM: 9.8 ug/dL (ref 6.7–22.6)

## 2021-07-09 LAB — RPR: RPR Ser Ql: NONREACTIVE

## 2021-07-09 MED ORDER — IBUPROFEN 200 MG PO TABS
600.0000 mg | ORAL_TABLET | Freq: Four times a day (QID) | ORAL | Status: DC | PRN
  Administered 2021-07-09: 600 mg via ORAL
  Filled 2021-07-09: qty 3

## 2021-07-09 NOTE — Progress Notes (Signed)
Neurology Consult Progress Note Subjective: Today patient reports her symptoms are unchanged except for an increase in muscle aching due to lying on stretcher. She walked with PT in the hallway. She reports has Vitamin D has been consistently low. We discussed her ongoing work up and plan of care. Her questions were answered.   Exam: Vitals:   07/09/21 1000 07/09/21 1300  BP: 110/70 115/75  Pulse: 90 85  Resp: 16 16  Temp:  98 F (36.7 C)  SpO2: 98% 98%   Constitutional: Appears well-developed and well-nourished.  Psych: Affect appropriate to situation Eyes: No scleral injection HENT: No OP obstrucion Head: Normocephalic.  Cardiovascular: Normal rate and regular rhythm.  Respiratory: Effort normal and breath sounds normal to anterior ascultation GI: Soft.  No distension. There is no tenderness.  Skin: WDI   Neuro: Mental Status: Patient is awake, alert, oriented to person, place, month, year, and situation. Patient is able to give a clear and coherent history. No signs of aphasia or neglect Speech/Language: Cranial Nerves: II: Visual Fields are full. Pupils are equal, round, and reactive to light.  III,IV, VI: EOMI without ptosis or diploplia.  V: Facial sensation is symmetric to temperature VII: Facial movement is symmetric.  VIII: hearing is intact to voice X: Uvula elevates symmetrically XI: Shoulder shrug is symmetric. XII: tongue is midline without atrophy or fasciculations.  Motor: Tone is normal. Bulk is normal. Tenderness to palpation in muscles in arms and legs BL. 5/5 strength was present in all four extremities. Sensory: Sensation is symmetric to light touch and temperature in the arms and legs. +Alloydynia bilateral upper and lower extremities diffusely in most muscle groups   Deep Tendon Reflexes: 2+ and symmetric in the biceps and patellae.  Plantars: Toes are downgoing bilaterally.  Cerebellar: FNF and HKS are intact bilaterally.    CK 47 a week ago  and today is 70 Pertinent Labs: Last vitamin D Lab Results  Component Value Date   VD25OH 28.90 (L) 07/08/2021     Assessment: [name] [name] is a 35 y.o. female with PMH significant for moderate persistent asthma, allergic rhinitis, and severe obesity who presents with bilateral upper and lower extremity weakness and pain which onset Nov 23rd and became the worst around Nov 26th prompting urgent care visit where she was diagnosed with upper respiratory virus with associated neck and chest rash which was red pruritic and painful (flu and mono negative). She was finished a course of augmentin and prednisone but it did not help. At that point she became unable to walk on her own due to subjective weakness. She was given a walker by a friend and began to use it to get around the house. She was unable to use the stairs in her home for about a week. She had some mild improvement after that but has still been unable to get around without that the walker and describes the weakness as stable since that time. Diffuse pain in her upper arms and bilat lower extremities has persisted. Rest relieves the pain. She reports she is sore to touch in arms and legs diffusely with some pain in legs activated by walking.   Impression:  Diffuse myalgias with subjective bilat lower extremity weakness, difficulty walking in the setting of recent upper respiratory viral infection which may be post viral myalgias in the setting of a viral infection. Alternatively could be inflammatory myositis or secondary to systemic rheumatologic disorder vs non inflammatory pain syndrome including fibromyalgia or chronic fatigue syndrome.  No obvious medications that could be causing this.   Recommendations: -Outpatient PT/OT has been recommended - ESR (8) , CRP (2), CK (33), HIV (negative) and pending labs Aldolase, RF, Anti CCP Ab, ANA, DSDNA, Anti Smith Ab, ANCA, obtained to to evaluate for autoimmune/systemic causes of myalgias. -  Vit D level 28, TSH 0.932, Cortisol 9.8 obtained to evaluate for endocrinologic etiology of her myalgias. Replete Vitamin D.  - Serum Lyme Ab (pending)  and RPR (nonreactive) to evaluate for Lyme and potential Syphilis. -Recommend outpatient referral to Dr. [name] with [facility] in 2-4 weeks.    NEUROHOSPITALIST ADDENDUM I did not assess the patient today.   I have reviewed the contents of history and physical exam as documented by PA/ARNP/Resident and agree with above documentation.  I have discussed and formulated the above plan as documented. Edits to the note have been made as needed.  No charge note.  Donnetta Simpers, MD [facility] [phone]   If 7pm to 7am, please call on call as listed on AMION.

## 2021-07-09 NOTE — Progress Notes (Signed)
Discharge instructions given to patient and husband and verbalized understanding.Patient discharged home with husband.

## 2021-07-09 NOTE — Hospital Course (Addendum)
[name] [name] is a 35 y.o. female presenting with 10 days of weakness and myalgias. PMH is significant for moderate persistent asthma, Bipolar I disorder, chronic allergic rhinitis, Class 3 obesity. Her hospital course is outlined below:   Weakness  Muscular Tenderness  Body Aches Patient presented with 10 days of weakness, body aches, and diffuse muscular tenderness. Initial workup with MRI of brain and spine were unremarkable with no evidence of demyelinating disease. Patient had intact reflexes and isolated muscle groups appeared to have intact strength. Presentation was less concerning for [name] Syndrome. Initial labs showed CK 33, CRP 2.0, sed rate 8, cortisol am 9.8, and no electrolyte abnormalities. Neurology began workup for rheumatologic disease, with differential including inflammatory myositis or fibromyalgia, and recommended patient follow up outpatient with Dr. Nita Sickle in 2-4 weeks. Patient was evaluated by physical therapy and occupational therapy who recommended outpatient PT. Patient was medically stable for discharge home.   Items for PCP Follow-up: Please ensure that patient has follow-up with Dr. Allena Katz, neurology. Patient will benefit greatly from outpatient physical therapy. Recommend weight loss for overall health and for control of MSK pain.

## 2021-07-09 NOTE — ED Notes (Signed)
Pt has been up with PT and OT, received breakfast and lunch trays, denies needs at this time. VSS, no distress noted. Significant other has been at bedside. Pt has room available upstairs, RN clarified with receiving RN as room needed specialized cleaning between pts and receiving RN confirms that room is ready. Awaiting transport at this time.

## 2021-07-09 NOTE — Progress Notes (Addendum)
Family Medicine Teaching Service Daily Progress Note Intern Pager: [phone]  Patient name: [name] [name] Medical record number: [id number] Date of birth: 03/18/1986 Age: 35 y.o. Gender: female  Primary Care Provider: Storm Frisk, MD Consultants: Neurology Code Status: FULL  Pt Overview and Major Events to Date:  12/6 Admitted to FPTS   Assessment and Plan: [name] A Rottmann is a 35 y.o. female presenting with 10 days of weakness and myalgias. PMH is significant for moderate persistent asthma, Bipolar I disorder, chronic allergic rhinitis, Class 3 obesity.    Weakness  Body Aches  Muscle Tenderness Patient endorses continued weakness and pain in the shoulder and hip girdles unresponsive to steroids in the setting of an acute viral illness 1-2 weeks ago. She reports having events of malaise periodically prior to this acute event every 2-3 months, but denies unilateral weakness, vision or hearing changes, or similar presentation. She has bouts of depression coinciding with intermittent malaise similar to her bipolar depression cycling. She has no new complaints this morning and denies CP, SOB, and abdominal pain. Differential still includes myositis, inflammatory brain pathology, mood related, and CHF.  Sed rate 8, CRP 2, CK 33; overall unremarkable and we will follow-up pending labs. - neurology following, appreciate recommendations  - inflammatory/autoimmune markers pending  - BNP pending to help r/o CHF   Bipolar Disorder  Stable. - cont home medications Lamictal and Lexapro   Asthma, moderate persistent  Allergic Rhinitis, chronic  Stable. - cont home medications albuterol, adavir, cetrizine, montelukast   FEN/GI: Vegan diet per patient preference  PPx: Lovenox Dispo:med-surg, possible d/c home later today or tomorrow   Subjective:  Patient is well appearing and resting comfortably. No new complaints this morning with no resolution of muscle girdle weakness and pain.    Objective: Temp:  [97.9 F (36.6 C)-98.2 F (36.8 C)] 98.2 F (36.8 C) (12/06 0100) Pulse Rate:  [91-116] 94 (12/06 0430) Resp:  [14-20] 20 (12/06 0330) BP: (102-139)/(62-84) 115/64 (12/06 0330) SpO2:  [96 %-100 %] 97 % (12/06 0430) Physical Exam: General: well appearing, NAD  Cardiovascular: regular rate and rhythm, no murmur appreciated.  Respiratory: clear, no wheezing or crackles  Abdomen: soft, non-tender  Extremities: no peripheral edema  Neuro: Strength 5/5 bilaterally in UE and LE. No focal deficits, no facial drooping, slurred speech, changes in vision.   Laboratory: Recent Labs  Lab 07/08/21 1235  WBC 12.1*  HGB 13.5  HCT 42.3  PLT 366   Recent Labs  Lab 07/08/21 1235  NA 134*  K 4.4  CL 100  CO2 26  BUN 10  CREATININE 0.74  CALCIUM 9.1  GLUCOSE 118[place]ale Chard, Medical Student 07/09/2021, 6:57 AM Thyra Breed, [facility] Family Medicine FPTS Intern pager: [phone], text pages welcome    FPTS Upper-Level Resident Addendum   I have independently interviewed and examined the patient. I have discussed the above with the original author and agree with their documentation. My edits for correction/addition/clarification are in within the document. Please see also any attending notes.   Evelena Leyden, DO  PGY-2, [facility] 07/09/2021 9:22 AM  FPTS Service pager: [phone] (text pages welcome through [facility])

## 2021-07-09 NOTE — ED Notes (Signed)
Pt able to ambulate to RR with assistance. Hospital socks applied.

## 2021-07-09 NOTE — Plan of Care (Signed)
  Problem: Education: Goal: Knowledge of General Education information will improve Description Including pain rating scale, medication(s)/side effects and non-pharmacologic comfort measures Outcome: Progressing   

## 2021-07-09 NOTE — Discharge Instructions (Signed)
Dear [name] [name],  Thank you for letting us participate in your care. You were hospitalized for weakness and muscle tenderness. We did some testing and were able to rule out some of the more serious possible causes such as multiple sclerosis. Most likely, your symptoms are caused by an inflammation of your muscles related to your recent viral infection.  We also collected a number of neurologic labs that will need to be followed up on an outpatient basis.    POST-HOSPITAL & CARE INSTRUCTIONS Be sure that you follow-up with both your primary care doctor and your neurologist to follow-up on the labs that we collected while you are here. Our physical therapists have recommended that you receive physical therapy services on an outpatient basis, they will call you to set this up. Go to your follow up appointments (listed below)   DOCTOR'S APPOINTMENT   Future Appointments  Date Time Provider Department Center  07/24/2021  9:50 AM Anders Simmonds, PA-C [facility] None  08/07/2021  9:00 AM Pricilla Riffle, MD [facility]     Take care and be well!  Family Medicine Teaching Service Inpatient Team [place]  [facility]  [address] [place], [place] [address] [phone]

## 2021-07-09 NOTE — Evaluation (Signed)
Physical Therapy Evaluation Patient Details Name: [name] [name] MRN: [id number] DOB: 1985/08/11 Today's Date: 07/09/2021  History of Present Illness  Pt is a 35 y/o female admitted 12/5 secondary to weakness and myalgia. PMH includes Bipolar disorder and asthma.  Clinical Impression  Pt admitted secondary to problem above with deficits below. Pt requiring min guard A and HHA for mobility tasks. Very slow and guarded during mobility tasks. Feel pt would benefit from outpatient PT at d/c to address current deficits. Will continue to follow acutely.        Recommendations for follow up therapy are one component of a multi-disciplinary discharge planning process, led by the attending physician.  Recommendations may be updated based on patient status, additional functional criteria and insurance authorization.  Follow Up Recommendations Outpatient PT    Assistance Recommended at Discharge Intermittent Supervision/Assistance  Functional Status Assessment Patient has had a recent decline in their functional status and demonstrates the ability to make significant improvements in function in a reasonable and predictable amount of time.  Equipment Recommendations  None recommended by PT    Recommendations for Other Services       Precautions / Restrictions Precautions Precautions: None Restrictions Weight Bearing Restrictions: No      Mobility  Bed Mobility Overal bed mobility: Independent                  Transfers Overall transfer level: Needs assistance Equipment used: None Transfers: Sit to/from Stand Sit to Stand: Supervision           General transfer comment: supervision for safety    Ambulation/Gait Ambulation/Gait assistance: Min guard Gait Distance (Feet): 75 Feet Assistive device: 1 person hand held assist Gait Pattern/deviations: Step-through pattern;Decreased stride length Gait velocity: Decreased     General Gait Details: Very slow, guarded gait  secondary to pain. Used HHA for increased comfort. Min guard for safety. Distance limited secondary to pain  Stairs            Wheelchair Mobility    Modified Rankin (Stroke Patients Only)       Balance Overall balance assessment: Mild deficits observed, not formally tested                                           Pertinent Vitals/Pain Pain Assessment: 0-10 Pain Score: 7  Pain Location: shoulders, back, and upper legs Pain Descriptors / Indicators: Aching Pain Intervention(s): Limited activity within patient's tolerance;Monitored during session;Repositioned    Home Living Family/patient expects to be discharged to:: Private residence Living Arrangements: Spouse/significant other Available Help at Discharge: Family;Available PRN/intermittently Type of Home: Other(Comment) (townhouse) Home Access: Level entry     Alternate Level Stairs-Number of Steps: flight Home Layout: Two level Home Equipment: Agricultural consultant (2 wheels);Rollator (4 wheels);Shower seat      Prior Function Prior Level of Function : Independent/Modified Independent             Mobility Comments: Using RW vs rollator for mobility ADLs Comments: Having to sit to take a shower secondary to fatigue     Hand Dominance        Extremity/Trunk Assessment   Upper Extremity Assessment Upper Extremity Assessment: Defer to OT evaluation    Lower Extremity Assessment Lower Extremity Assessment: Generalized weakness    Cervical / Trunk Assessment Cervical / Trunk Assessment: Normal  Communication   Communication: No difficulties  Cognition Arousal/Alertness: Awake/alert Behavior During Therapy: WFL for tasks assessed/performed Overall Cognitive Status: Within Functional Limits for tasks assessed                                          General Comments General comments (skin integrity, edema, etc.): Pt's husband present during session    Exercises      Assessment/Plan    PT Assessment Patient needs continued PT services  PT Problem List Decreased strength;Decreased mobility;Decreased activity tolerance;Pain       PT Treatment Interventions DME instruction;Gait training;Therapeutic activities;Therapeutic exercise;Functional mobility training;Stair training;Balance training;Patient/family education    PT Goals (Current goals can be found in the Care Plan section)  Acute Rehab PT Goals Patient Stated Goal: to feel better PT Goal Formulation: With patient Time For Goal Achievement: 07/23/21 Potential to Achieve Goals: Good    Frequency Min 3X/week   Barriers to discharge        Co-evaluation               AM-PAC PT "6 Clicks" Mobility  Outcome Measure Help needed turning from your back to your side while in a flat bed without using bedrails?: None Help needed moving from lying on your back to sitting on the side of a flat bed without using bedrails?: None Help needed moving to and from a bed to a chair (including a wheelchair)?: A Little Help needed standing up from a chair using your arms (e.g., wheelchair or bedside chair)?: A Little Help needed to walk in hospital room?: A Little Help needed climbing 3-5 steps with a railing? : A Lot 6 Click Score: 19    End of Session Equipment Utilized During Treatment: Gait belt Activity Tolerance: Patient limited by pain Patient left: in bed;with call bell/phone within reach;with family/visitor present (on stretcher in ED) Nurse Communication: Mobility status PT Visit Diagnosis: Other abnormalities of gait and mobility (R26.89);Pain Pain - part of body: Arm;Shoulder;Leg    Time: 0350-0938 PT Time Calculation (min) (ACUTE ONLY): 14 min   Charges:   PT Evaluation $PT Eval Low Complexity: 1 Low          Cindee Salt, DPT  Acute Rehabilitation Services  Pager: [phone] Office: [phone]   Lehman Prom 07/09/2021, 2:42 PM

## 2021-07-09 NOTE — Discharge Summary (Addendum)
[facility] Discharge Summary  Patient name: [name] [name] Medical record number: [id number] Date of birth: August 30, 1985 Age: 35 y.o. Gender: female Date of Admission: 07/08/2021  Date of Discharge: 07/09/2021 Admitting Physician: [name], MD  Primary Care Provider: [name], MD Consultants: Neurology  Indication for Hospitalization: Weakness  Discharge Diagnoses/Problem List:  Principal Problem:   Weakness  Disposition: Home with [facility] PT  Discharge Condition: Stable  Discharge Exam: From same day progress note: General: well appearing, NAD  Cardiovascular: regular rate and rhythm, no murmur appreciated.  Respiratory: clear, no wheezing or crackles  Abdomen: soft, non-tender  Extremities: no peripheral edema  Neuro: Strength 5/5 bilaterally in UE and LE. No focal deficits, no facial drooping, slurred speech, changes in vision.   Brief Hospital Course:  [name] [name] is a 35 y.o. female who presented with 10 days of weakness and myalgias. PMH is significant for moderate persistent asthma, Bipolar I disorder, chronic allergic rhinitis, Class 3 obesity. Her hospital course is outlined below:   Weakness  Muscular Tenderness  Body Aches Patient presented with 10 days of weakness, body aches, and diffuse muscular tenderness. Initial workup with MRI of brain and spine were unremarkable with no evidence of demyelinating disease. Patient had intact reflexes and isolated muscle groups appeared to have intact strength. Presentation was less concerning for [name] Syndrome. Initial labs showed CK 33, CRP 2.0, sed rate 8, cortisol am 9.8, and no electrolyte abnormalities. Neurology began workup for rheumatologic disease, with several labs pending at the time of discharge (see below). If rheumatologic workup negative, differential includes inflammatory myositis vs fibromyalgia vs functional disorder. Neuro recommended outpatient follow up  with Dr. [name] in 2-4 weeks. Patient was evaluated by physical therapy and occupational therapy who recommended outpatient PT.   Items for PCP Follow-up: Follow up with psychiatry for bipolar disorder to optimize treatment.  Follow up on autoimmune lab results.  Outpatient PT follow up.  Follow up with Neurology, Dr. [name]    Significant Procedures: None  Significant Labs and Imaging:  Recent Labs  Lab 07/08/21 1235  WBC 12.1*  HGB 13.5  HCT 42.3  PLT 366   Recent Labs  Lab 07/08/21 1235  NA 134*  K 4.4  CL 100  CO2 26  GLUCOSE 118*  BUN 10  CREATININE 0.74  CALCIUM 9.1   MR THORACIC SPINE W WO CONTRAST  Result Date: 07/08/2021 CLINICAL DATA:  Motor neuron disease EXAM: MRI THORACIC AND LUMBAR SPINE WITHOUT AND WITH CONTRAST TECHNIQUE: Multiplanar and multiecho pulse sequences of the thoracic and lumbar spine were obtained without and with intravenous contrast. CONTRAST:  22mL GADAVIST GADOBUTROL 1 MMOL/ML IV SOLN COMPARISON:  Chest CT 03/15/2019 FINDINGS: MRI THORACIC SPINE FINDINGS There is respiratory motion artifact present which mildly degrades image quality on axial sequences. Alignment:  Physiologic. Vertebrae:  No fracture, evidence of discitis, or bone lesion. Cord: No abnormal cord signal.  No abnormal enhancement. Paraspinal and other soft tissues: Negative Disc levels: Evaluation of the disc levels are demonstrates no large disc herniation, significant spinal canal or neural foraminal narrowing. MRI LUMBAR SPINE FINDINGS Segmentation:  Standard. Alignment:  Physiologic. Vertebrae: No fracture, evidence of discitis, or aggressive bone lesion. Conus medullaris and cauda equina: Conus extends to the L1 level. Conus and cauda equina appear normal. No abnormal enhancement. Paraspinal and other soft tissues: Tiny bilateral renal cysts. Disc levels: T12-L1: No significant spinal canal or neural foraminal narrowing. L1-L2: No significant spinal canal  or neural foraminal  narrowing. L2-L3: No significant spinal canal or neural foraminal narrowing. L3-L4: No significant spinal canal or neural foraminal narrowing. L4-L5: No significant spinal canal or neural foraminal narrowing L5-S1: No significant spinal canal or neural foraminal narrowing. IMPRESSION: Normal MRI of the thoracic and lumbar spine. Electronically Signed   By: Caprice Renshaw M.D.   On: 07/08/2021 16:45   MR Lumbar Spine W Wo Contrast  Result Date: 07/08/2021 CLINICAL DATA:  Motor neuron disease EXAM: MRI THORACIC AND LUMBAR SPINE WITHOUT AND WITH CONTRAST TECHNIQUE: Multiplanar and multiecho pulse sequences of the thoracic and lumbar spine were obtained without and with intravenous contrast. CONTRAST:  39mL GADAVIST GADOBUTROL 1 MMOL/ML IV SOLN COMPARISON:  Chest CT 03/15/2019 FINDINGS: MRI THORACIC SPINE FINDINGS There is respiratory motion artifact present which mildly degrades image quality on axial sequences. Alignment:  Physiologic. Vertebrae:  No fracture, evidence of discitis, or bone lesion. Cord: No abnormal cord signal.  No abnormal enhancement. Paraspinal and other soft tissues: Negative Disc levels: Evaluation of the disc levels are demonstrates no large disc herniation, significant spinal canal or neural foraminal narrowing. MRI LUMBAR SPINE FINDINGS Segmentation:  Standard. Alignment:  Physiologic. Vertebrae: No fracture, evidence of discitis, or aggressive bone lesion. Conus medullaris and cauda equina: Conus extends to the L1 level. Conus and cauda equina appear normal. No abnormal enhancement. Paraspinal and other soft tissues: Tiny bilateral renal cysts. Disc levels: T12-L1: No significant spinal canal or neural foraminal narrowing. L1-L2: No significant spinal canal or neural foraminal narrowing. L2-L3: No significant spinal canal or neural foraminal narrowing. L3-L4: No significant spinal canal or neural foraminal narrowing. L4-L5: No significant spinal canal or neural foraminal narrowing L5-S1: No  significant spinal canal or neural foraminal narrowing. IMPRESSION: Normal MRI of the thoracic and lumbar spine. Electronically Signed   By: Caprice Renshaw M.D.   On: 07/08/2021 16:45     Results/Tests Pending at Time of Discharge: Lyme Serology, Anti-dsDNA, Anti-Smith, CCP antibody, ANCA, ANA, Rheumatoid factor  Discharge Medications:  Allergies as of 07/09/2021       Reactions   Other    Bananas, kiwi   Wellbutrin [bupropion] Hives        Medication List     STOP taking these medications    budesonide 32 MCG/ACT nasal spray Commonly known as: RHINOCORT AQUA   fluconazole 150 MG tablet Commonly known as: Diflucan   fluticasone 110 MCG/ACT inhaler Commonly known as: Flovent HFA   mometasone 50 MCG/ACT nasal spray Commonly known as: NASONEX       TAKE these medications    acetaminophen 500 MG tablet Commonly known as: TYLENOL Take 1,000 mg by mouth every 6 (six) hours as needed for moderate pain.   albuterol 108 (90 Base) MCG/ACT inhaler Commonly known as: VENTOLIN HFA Inhale 2 puffs into the lungs every 6 (six) hours as needed for wheezing or shortness of breath.   benzonatate 100 MG capsule Commonly known as: TESSALON Take 1-2 capsules (100-200 mg total) by mouth 3 (three) times daily as needed for cough.   cetirizine 10 MG tablet Commonly known as: ZYRTEC Take 1 tablet (10 mg total) by mouth daily.   escitalopram 20 MG tablet Commonly known as: Lexapro Take 1 tablet (20 mg total) by mouth at bedtime.   fluticasone-salmeterol 100-50 MCG/ACT Aepb Commonly known as: Advair Diskus Inhale 1 puff into the lungs 2 (two) times daily.   ibuprofen 200 MG tablet Commonly known as: ADVIL Take 600 mg by mouth every 6 (six)  hours as needed for moderate pain.   ipratropium 0.06 % nasal spray Commonly known as: ATROVENT Place 2 sprays into both nostrils 4 (four) times daily. As needed for nasal congestion, runny nose   lamoTRIgine 200 MG tablet Commonly known as:  LaMICtal Take 1 tablet (200 mg total) by mouth 2 (two) times daily.   lansoprazole 30 MG capsule Commonly known as: Prevacid Take 1 capsule (30 mg total) by mouth daily at 12 noon.   montelukast 10 MG tablet Commonly known as: SINGULAIR Take 1 tablet (10 mg total) by mouth at bedtime.   MUCINEX PO Take 1 capsule by mouth daily as needed (congestion).   pramipexole 1.5 MG tablet Commonly known as: Mirapex Take 1 tablet (1.5 mg total) by mouth at bedtime.   promethazine-dextromethorphan 6.25-15 MG/5ML syrup Commonly known as: PROMETHAZINE-DM Take 5 mLs by mouth at bedtime as needed for cough.   sodium chloride 0.65 % Soln nasal spray Commonly known as: OCEAN Place 1 spray into both nostrils as needed for congestion.   thiamine 100 MG tablet Take 100 mg by mouth daily.   Vitamin D3 50 MCG (2000 UT) Tabs Take 1 oral tablet once a day        Discharge Instructions: Please refer to Patient Instructions section of EMR for full details.  Patient was counseled important signs and symptoms that should prompt return to medical care, changes in medications, dietary instructions, activity restrictions, and follow up appointments.   Follow-Up Appointments:  Future Appointments  Date Time Provider [place]  07/24/2021  9:50 AM Argentina Donovan, PA-C [facility] None  08/07/2021  9:00 AM Fay Records, MD [facility]     Eppie Gibson, MD 07/09/2021, 8:07 PM PGY-1, [facility] Upper-Level Resident Addendum   I have independently interviewed and examined the patient. I have discussed the above with Dr. Joelyn Oms and agree with the documentation. My edits for correction/addition/clarification are included above. Please see any attending notes.   Alcus Dad, MD PGY-2, [facility] 07/10/2021 12:40 AM  FPTS Service pager: [phone] (text pages welcome through [facility])

## 2021-07-09 NOTE — Evaluation (Signed)
Occupational Therapy Evaluation Patient Details Name: [name] [name] MRN: [id number] DOB: [birth year] Today's Date: 07/09/2021   History of Present Illness Pt is a 35 y/o female admitted 12/5 secondary to weakness and myalgia. PMH includes Bipolar disorder and asthma.   Clinical Impression   Patient evaluated by Occupational Therapy with no further acute OT needs identified. All education has been completed and the patient has no further questions. Pt is able to complete ADLs mod I, but fatigues quickly.  Recommend OPOT for IADLs.   See below for any follow-up Occupational Therapy or equipment needs. OT is signing off. Thank you for this referral.       Recommendations for follow up therapy are one component of a multi-disciplinary discharge planning process, led by the attending physician.  Recommendations may be updated based on patient status, additional functional criteria and insurance authorization.   Follow Up Recommendations  Outpatient OT    Assistance Recommended at Discharge None  Functional Status Assessment  Patient has had a recent decline in their functional status and demonstrates the ability to make significant improvements in function in a reasonable and predictable amount of time.  Equipment Recommendations  None recommended by OT    Recommendations for Other Services       Precautions / Restrictions Precautions Precautions: None      Mobility Bed Mobility Overal bed mobility: Independent                  Transfers Overall transfer level: Modified independent                        Balance Overall balance assessment: Mild deficits observed, not formally tested                                         ADL either performed or assessed with clinical judgement   ADL Overall ADL's : Modified independent                                       General ADL Comments: Pt requires increased time to complete  tasks.  Discussed energy conservation strategies     Vision Baseline Vision/History: 1 Wears glasses Ability to See in Adequate Light: 0 Adequate Patient Visual Report: No change from baseline       Perception     Praxis      Pertinent Vitals/Pain Pain Assessment: Faces Faces Pain Scale: Hurts little more Pain Location: LEs with activity Pain Descriptors / Indicators: Aching Pain Intervention(s): Monitored during session     Hand Dominance Right   Extremity/Trunk Assessment Upper Extremity Assessment Upper Extremity Assessment: Overall WFL for tasks assessed   Lower Extremity Assessment Lower Extremity Assessment: Defer to PT evaluation   Cervical / Trunk Assessment Cervical / Trunk Assessment: Normal   Communication Communication Communication: No difficulties   Cognition Arousal/Alertness: Awake/alert Behavior During Therapy: WFL for tasks assessed/performed Overall Cognitive Status: Within Functional Limits for tasks assessed                                       General Comments  discussed how to progress activity safely at home and possible types of exercise she coud engage  in to improve her endurance and strength    Exercises     Shoulder Instructions      Home Living Family/patient expects to be discharged to:: Private residence Living Arrangements: Spouse/significant other Available Help at Discharge: Family;Available PRN/intermittently Type of Home: Other(Comment) (townhouse) Home Access: Level entry     Home Layout: Two level Alternate Level Stairs-Number of Steps: flight Alternate Level Stairs-Rails: Left Bathroom Shower/Tub: Chief Strategy Officer: Standard     Home Equipment: Agricultural consultant (2 wheels);Rollator (4 wheels);Shower seat          Prior Functioning/Environment Prior Level of Function : Independent/Modified Independent             Mobility Comments: Using RW vs rollator for mobility ADLs  Comments: Having to sit to take a shower secondary to fatigue        OT Problem List: Decreased activity tolerance;Pain      OT Treatment/Interventions:      OT Goals(Current goals can be found in the care plan section) Acute Rehab OT Goals Patient Stated Goal: to have more energy OT Goal Formulation: All assessment and education complete, DC therapy  OT Frequency:     Barriers to D/C:            Co-evaluation              AM-PAC OT "6 Clicks" Daily Activity     Outcome Measure Help from another person eating meals?: None Help from another person taking care of personal grooming?: None Help from another person toileting, which includes using toliet, bedpan, or urinal?: None Help from another person bathing (including washing, rinsing, drying)?: None Help from another person to put on and taking off regular upper body clothing?: None Help from another person to put on and taking off regular lower body clothing?: None 6 Click Score: 24   End of Session Nurse Communication: Mobility status  Activity Tolerance: Patient tolerated treatment well Patient left: in bed;with call bell/phone within reach;with family/visitor present  OT Visit Diagnosis: Unsteadiness on feet (R26.81)                Time: 0277-4128 OT Time Calculation (min): 15 min Charges:  OT General Charges $OT Visit: 1 Visit OT Evaluation $OT Eval Low Complexity: 1 Low  Eber Jones., OTR/L Acute Rehabilitation Services Pager [phone] Office [phone]   Jeani Hawking M 07/09/2021, 5:02 PM

## 2021-07-09 NOTE — ED Notes (Signed)
Pt states she only takes Lamictal 200mg  one time a day. RN sent secure chat to resident DO to clarify, no new orders.

## 2021-07-09 NOTE — Discharge Summary (Shared)
[facility] Discharge Summary  Patient name: [name] [name] Medical record number: [id number] Date of birth: 1986/02/24 Age: 35 y.o. Gender: female Date of Admission: 07/08/2021  Date of Discharge: 07/09/2021 Admitting Physician: [name], MD  Primary Care Provider: [name], MD Consultants: Neurology   Indication for Hospitalization: bilateral UE/LE weakness   Discharge Diagnoses/Problem List:  Principal Problem:   Weakness    Disposition: home  Discharge Condition: medically stable   Discharge Exam:  Blood pressure (!) 116/58, pulse 90, temperature 98.1 F (36.7 C), temperature source Oral, resp. rate 20, height 5\' 4"  (1.626 m), weight 280 lb (127 kg), SpO2 100 %.  General: Patient is well appearing in NAD  Cardio: regular rate and rhythm  Pulm: respiratory effort normal. lungs clear, with no wheezing, crackles, or rhonchi  Abdominal: soft, non-tender, no guarding or palpable masses  Extremities: no peripheral edema Skin: no rashes or lacerations  Neuro: alert and oriented x 4, no focal deficits, no facial droop, strength 5/5 bilateral UE and LE   Brief Hospital Course:  [name] [name] is a 35 y.o. female presenting with 10 days of weakness and myalgias. PMH is significant for moderate persistent asthma, Bipolar I disorder, chronic allergic rhinitis, Class 3 obesity. Her hospital course is outlined below:   Weakness   Muscular Tenderness   Body Aches Patient presented with 10 days of weakness, body aches, and diffuse muscular tenderness. Initial workup with MRI of brain and spine were unremarkable with no evidence of demyelinating disease. Patient had intact reflexes and isolated muscle groups appeared to have intact strength. Presentation was less concerning for [name] Syndrome. Initial labs showed CK 33, CRP 2.0, sed rate 8, cortisol am 9.8, and no electrolyte abnormalities. Neurology began workup for rheumatologic  disease, with differential including inflammatory myositis or fibromyalgia, and recommended patient follow up outpatient with Dr. [name] in 2-4 weeks. Patient was evaluated by physical therapy and occupational therapy who recommended outpatient PT. Patient was medically stable for discharge home.   Items for PCP Follow-up: Please ensure that patient has follow-up with Dr. [name], neurology. Patient will benefit greatly from outpatient physical therapy. Recommend weight loss for overall health and for control of MSK pain.  Significant Procedures: none  Significant Labs and Imaging:  Recent Labs  Lab 07/08/21 1235  WBC 12.1*  HGB 13.5  HCT 42.3  PLT 366   Recent Labs  Lab 07/08/21 1235  NA 134*  K 4.4  CL 100  CO2 26  GLUCOSE 118*  BUN 10  CREATININE 0.74  CALCIUM 9.1   MR Lumbar Spine W Wo Contrast CLINICAL DATA:  Motor neuron disease  EXAM: MRI THORACIC AND LUMBAR SPINE WITHOUT AND WITH CONTRAST  TECHNIQUE: Multiplanar and multiecho pulse sequences of the thoracic and lumbar spine were obtained without and with intravenous contrast.  CONTRAST:  44mL GADAVIST GADOBUTROL 1 MMOL/ML IV SOLN  COMPARISON:  Chest CT 03/15/2019  FINDINGS: MRI THORACIC SPINE FINDINGS  There is respiratory motion artifact present which mildly degrades image quality on axial sequences.  Alignment:  Physiologic.  Vertebrae:  No fracture, evidence of discitis, or bone lesion.  Cord: No abnormal cord signal.  No abnormal enhancement.  Paraspinal and other soft tissues: Negative  Disc levels: Evaluation of the disc levels are demonstrates no large disc herniation, significant spinal canal or neural foraminal narrowing.  MRI LUMBAR SPINE FINDINGS  Segmentation:  Standard.  Alignment:  Physiologic.  Vertebrae: No fracture, evidence of discitis,  or aggressive bone lesion.  Conus medullaris and cauda equina: Conus extends to the L1 level. Conus and cauda equina appear normal.  No abnormal enhancement.  Paraspinal and other soft tissues: Tiny bilateral renal cysts.  Disc levels:  T12-L1: No significant spinal canal or neural foraminal narrowing.  L1-L2: No significant spinal canal or neural foraminal narrowing.  L2-L3: No significant spinal canal or neural foraminal narrowing.  L3-L4: No significant spinal canal or neural foraminal narrowing.  L4-L5: No significant spinal canal or neural foraminal narrowing  L5-S1: No significant spinal canal or neural foraminal narrowing.  IMPRESSION: Normal MRI of the thoracic and lumbar spine.  Electronically Signed   By: Caprice Renshaw M.D.   On: 07/08/2021 16:45 MR THORACIC SPINE W WO CONTRAST CLINICAL DATA:  Motor neuron disease  EXAM: MRI THORACIC AND LUMBAR SPINE WITHOUT AND WITH CONTRAST  TECHNIQUE: Multiplanar and multiecho pulse sequences of the thoracic and lumbar spine were obtained without and with intravenous contrast.  CONTRAST:  26mL GADAVIST GADOBUTROL 1 MMOL/ML IV SOLN  COMPARISON:  Chest CT 03/15/2019  FINDINGS: MRI THORACIC SPINE FINDINGS  There is respiratory motion artifact present which mildly degrades image quality on axial sequences.  Alignment:  Physiologic.  Vertebrae:  No fracture, evidence of discitis, or bone lesion.  Cord: No abnormal cord signal.  No abnormal enhancement.  Paraspinal and other soft tissues: Negative  Disc levels: Evaluation of the disc levels are demonstrates no large disc herniation, significant spinal canal or neural foraminal narrowing.  MRI LUMBAR SPINE FINDINGS  Segmentation:  Standard.  Alignment:  Physiologic.  Vertebrae: No fracture, evidence of discitis, or aggressive bone lesion.  Conus medullaris and cauda equina: Conus extends to the L1 level. Conus and cauda equina appear normal. No abnormal enhancement.  Paraspinal and other soft tissues: Tiny bilateral renal cysts.  Disc levels:  T12-L1: No significant spinal canal or neural  foraminal narrowing.  L1-L2: No significant spinal canal or neural foraminal narrowing.  L2-L3: No significant spinal canal or neural foraminal narrowing.  L3-L4: No significant spinal canal or neural foraminal narrowing.  L4-L5: No significant spinal canal or neural foraminal narrowing  L5-S1: No significant spinal canal or neural foraminal narrowing.  IMPRESSION: Normal MRI of the thoracic and lumbar spine.  Electronically Signed   By: Caprice Renshaw M.D.   On: 07/08/2021 16:45   Results/Tests Pending at Time of Discharge: ANA, RF, ANCA, Anti-Smith, Anti-DNA, Lyme Disease serology reflex, Cyclic Citrul Peptide Antibody  Discharge Medications:  Allergies as of 07/09/2021       Reactions   Other    Bananas, kiwi   Wellbutrin [bupropion] Hives        Medication List     STOP taking these medications    budesonide 32 MCG/ACT nasal spray Commonly known as: RHINOCORT AQUA   fluconazole 150 MG tablet Commonly known as: Diflucan   fluticasone 110 MCG/ACT inhaler Commonly known as: Flovent HFA   mometasone 50 MCG/ACT nasal spray Commonly known as: NASONEX       TAKE these medications    acetaminophen 500 MG tablet Commonly known as: TYLENOL Take 1,000 mg by mouth every 6 (six) hours as needed for moderate pain.   albuterol 108 (90 Base) MCG/ACT inhaler Commonly known as: VENTOLIN HFA Inhale 2 puffs into the lungs every 6 (six) hours as needed for wheezing or shortness of breath.   benzonatate 100 MG capsule Commonly known as: TESSALON Take 1-2 capsules (100-200 mg total) by mouth 3 (three) times daily as  needed for cough.   cetirizine 10 MG tablet Commonly known as: ZYRTEC Take 1 tablet (10 mg total) by mouth daily.   escitalopram 20 MG tablet Commonly known as: Lexapro Take 1 tablet (20 mg total) by mouth at bedtime.   fluticasone-salmeterol 100-50 MCG/ACT Aepb Commonly known as: Advair Diskus Inhale 1 puff into the lungs 2 (two) times daily.    ibuprofen 200 MG tablet Commonly known as: ADVIL Take 600 mg by mouth every 6 (six) hours as needed for moderate pain.   ipratropium 0.06 % nasal spray Commonly known as: ATROVENT Place 2 sprays into both nostrils 4 (four) times daily. As needed for nasal congestion, runny nose   lamoTRIgine 200 MG tablet Commonly known as: LaMICtal Take 1 tablet (200 mg total) by mouth 2 (two) times daily.   lansoprazole 30 MG capsule Commonly known as: Prevacid Take 1 capsule (30 mg total) by mouth daily at 12 noon.   montelukast 10 MG tablet Commonly known as: SINGULAIR Take 1 tablet (10 mg total) by mouth at bedtime.   MUCINEX PO Take 1 capsule by mouth daily as needed (congestion).   pramipexole 1.5 MG tablet Commonly known as: Mirapex Take 1 tablet (1.5 mg total) by mouth at bedtime.   promethazine-dextromethorphan 6.25-15 MG/5ML syrup Commonly known as: PROMETHAZINE-DM Take 5 mLs by mouth at bedtime as needed for cough.   sodium chloride 0.65 % Soln nasal spray Commonly known as: OCEAN Place 1 spray into both nostrils as needed for congestion.   thiamine 100 MG tablet Take 100 mg by mouth daily.   Vitamin D3 50 MCG (2000 UT) Tabs Take 1 oral tablet once a day        Discharge Instructions: Please refer to Patient Instructions section of EMR for full details.  Patient was counseled important signs and symptoms that should prompt return to medical care, changes in medications, dietary instructions, activity restrictions, and follow up appointments.   Follow-Up Appointments: Future Appointments  Date Time Provider [place]  07/24/2021  9:50 AM Argentina Donovan, PA-C [facility] None  08/07/2021  9:00 AM Fay Records, MD [facility]    Darci Current, Medical Student 07/10/2021, 9:43 AM Dani Gobble, [place]

## 2021-07-10 ENCOUNTER — Telehealth: Payer: Self-pay

## 2021-07-10 LAB — RHEUMATOID FACTOR: Rheumatoid fact SerPl-aCnc: <10 IU/mL (ref <14.0)

## 2021-07-10 LAB — ANTI-SMITH ANTIBODY: ENA SM Ab Ser-aCnc: <0.2 AI (ref 0.0–0.9)

## 2021-07-10 LAB — ANCA TITERS
Atypical P-ANCA titer: <1:20 {titer}
C-ANCA: <1:20 {titer}
P-ANCA: <1:20 {titer}

## 2021-07-10 LAB — LYME DISEASE SEROLOGY W/REFLEX: Lyme Total Antibody EIA: NEGATIVE

## 2021-07-10 LAB — ANA W/REFLEX IF POSITIVE: Anti Nuclear Antibody (ANA): NEGATIVE

## 2021-07-10 LAB — CYCLIC CITRUL PEPTIDE ANTIBODY, IGG/IGA: CCP Antibodies IgG/IgA: 3 units (ref 0–19)

## 2021-07-10 LAB — ANTI-DNA ANTIBODY, DOUBLE-STRANDED: ds DNA Ab: <1 IU/mL (ref 0–9)

## 2021-07-10 NOTE — Telephone Encounter (Signed)
Transition Care Management Unsuccessful Follow-up Telephone Call  Date of discharge and from where:  07/09/2021, [facility]   Attempts:  1st Attempt  Reason for unsuccessful TCM follow-up call:  Left voice message on [phone]. Call back requested to this CM.  Patient has appointment with Georgian Co, PA @ [facility]  - 07/24/2021

## 2021-07-11 ENCOUNTER — Encounter: Payer: Self-pay | Admitting: Neurology

## 2021-07-11 ENCOUNTER — Other Ambulatory Visit: Payer: Self-pay | Admitting: Critical Care Medicine

## 2021-07-11 ENCOUNTER — Telehealth: Payer: Self-pay

## 2021-07-11 DIAGNOSIS — M6281 Muscle weakness (generalized): Secondary | ICD-10-CM

## 2021-07-11 DIAGNOSIS — M609 Myositis, unspecified: Secondary | ICD-10-CM

## 2021-07-11 NOTE — Telephone Encounter (Signed)
Call placed to patient and informed her that Dr Delford Field would like her to stay on Advair for now.  She said she understood

## 2021-07-11 NOTE — Telephone Encounter (Signed)
Stay on advair for now

## 2021-07-11 NOTE — Telephone Encounter (Signed)
I called the Pt an schedule a financial appt for 07/15/21

## 2021-07-11 NOTE — Telephone Encounter (Signed)
Transition Care Management Follow-up Telephone Call Date of discharge and from where: 07/09/2021, [facility]  How have you been since you were released from the hospital? She said she is better than last week but still weak, tired and congested. She spoke to Dr Delford Field this morning and he went over test results from the hospital. She stated that her test results are coming back normal and it will just take awhile for her to feel better. She needs to apply for [facility] Financial Assistance/ [employer] Card to qualify for assistance with medical bills/ co-pays for specialists, testing and outpatient PT.  She was denied [place]FA/ [place] 6 months ago because she was not able to produce a hard copy of a bank statement.  She explained that her banking is all done on line and she does not receive printed statements and [facility] would not accept the document from her bank that she provided.  Will ask our financial counselor to contact her for further guidance re. Re-applying.  Any questions or concerns? Yes- She would like Dr Delford Field to review her asthma medications.  She explained that when she went to Urgent Care/ED some of her medications were changed and she wants to confirm that this is okay.  The mometasone was discontinued and she feels she should be using this.    Items Reviewed: Did the pt receive and understand the discharge instructions provided? Yes  Medications obtained and verified? Yes  - she said she has all medications.  Question regarding asthma meds noted above.  Other? No  Any new allergies since your discharge? No  Dietary orders reviewed? No Do you have support at home? Yes   Home Care and Equipment/Supplies: Were home health services ordered? no If so, what is the name of the agency? N/a  Has the agency set up a time to come to the patient's home? not applicable Were any new equipment or medical supplies ordered?  No What is the name of the medical supply agency? N/a Were you able to get the  supplies/equipment? not applicable Do you have any questions related to the use of the equipment or supplies? No  Functional Questionnaire: (I = Independent and D = Dependent) ADLs: independent  Follow up appointments reviewed:  PCP Hospital f/u appt confirmed? Yes  Scheduled to see Rose Phi Clung, PA on 07/24/2021. Specialist Hospital f/u appt confirmed? Yes - cardiology - 08/07/2020.   Are transportation arrangements needed? No  If their condition worsens, is the pt aware to call PCP or go to the Emergency Dept.? Yes Was the patient provided with contact information for the PCP's office or ED? Yes Was to pt encouraged to call back with questions or concerns? Yes

## 2021-07-13 ENCOUNTER — Encounter: Payer: Self-pay | Admitting: Critical Care Medicine

## 2021-07-13 ENCOUNTER — Other Ambulatory Visit: Payer: Self-pay | Admitting: Critical Care Medicine

## 2021-07-13 MED ORDER — SALINE SPRAY 0.65 % NA SOLN
1.0000 | NASAL | 2 refills | Status: DC | PRN
Start: 2021-07-13 — End: 2021-08-07
  Filled 2021-07-13: qty 60, fill #0

## 2021-07-15 ENCOUNTER — Ambulatory Visit: Payer: Self-pay | Attending: Critical Care Medicine

## 2021-07-15 ENCOUNTER — Other Ambulatory Visit: Payer: Self-pay

## 2021-07-15 ENCOUNTER — Ambulatory Visit: Payer: Self-pay | Admitting: Critical Care Medicine

## 2021-07-17 ENCOUNTER — Other Ambulatory Visit: Payer: Self-pay

## 2021-07-24 ENCOUNTER — Ambulatory Visit: Payer: Self-pay | Attending: Physician Assistant | Admitting: Physician Assistant

## 2021-07-24 ENCOUNTER — Other Ambulatory Visit: Payer: Self-pay

## 2021-07-24 ENCOUNTER — Encounter: Payer: Self-pay | Admitting: Physician Assistant

## 2021-07-24 VITALS — BP 133/83 | HR 96 | Ht 64.0 in

## 2021-07-24 DIAGNOSIS — Z09 Encounter for follow-up examination after completed treatment for conditions other than malignant neoplasm: Secondary | ICD-10-CM

## 2021-07-24 DIAGNOSIS — R7982 Elevated C-reactive protein (CRP): Secondary | ICD-10-CM

## 2021-07-24 DIAGNOSIS — L309 Dermatitis, unspecified: Secondary | ICD-10-CM

## 2021-07-24 DIAGNOSIS — R531 Weakness: Secondary | ICD-10-CM

## 2021-07-24 DIAGNOSIS — R739 Hyperglycemia, unspecified: Secondary | ICD-10-CM

## 2021-07-24 DIAGNOSIS — D72829 Elevated white blood cell count, unspecified: Secondary | ICD-10-CM

## 2021-07-24 DIAGNOSIS — K089 Disorder of teeth and supporting structures, unspecified: Secondary | ICD-10-CM

## 2021-07-24 DIAGNOSIS — M62838 Other muscle spasm: Secondary | ICD-10-CM

## 2021-07-24 MED ORDER — METHOCARBAMOL 500 MG PO TABS
500.0000 mg | ORAL_TABLET | Freq: Four times a day (QID) | ORAL | 1 refills | Status: DC | PRN
  Filled 2021-07-24: qty 90, 23d supply, fill #0

## 2021-07-24 MED ORDER — TRIAMCINOLONE ACETONIDE 0.1 % EX CREA
1.0000 "application " | TOPICAL_CREAM | Freq: Two times a day (BID) | CUTANEOUS | 1 refills | Status: DC
  Filled 2021-07-24: qty 45, 23d supply, fill #0

## 2021-07-24 NOTE — Progress Notes (Addendum)
Patient ID: [name] [name], female   DOB: [birth year], 35 y.o.   MRN: [id number]   [name] [name], is a 35 y.o. female  [id number]  [id number]  DOB - 1986-06-02  Chief Complaint  Patient presents with   Hospitalization Follow-up       Subjective:   [name] [name] is a 35 y.o. female here today for a follow up visit After hospitalization 12/5-12/01/2021 for weakness.  When she left the hospital she was using a walker.  Now she has a cane and is barely having to use it.  She is able to go up and down stairs.  Still having muscle pain and some weakness.  Does not have f/up scheduled with PT.  No fevers.  Tylenol or aleve helps some with muscle pain.  She also needs a cream for eczema.  Requesting dental referral    Discharge Diagnoses/Problem List:  Principal Problem:   Weakness   Disposition: Home with [place] Hospital Course:  [name] [name] is a 35 y.o. female who presented with 10 days of weakness and myalgias. PMH is significant for moderate persistent asthma, Bipolar I disorder, chronic allergic rhinitis, Class 3 obesity. Her hospital course is outlined below:    Weakness   Muscular Tenderness   Body Aches Patient presented with 10 days of weakness, body aches, and diffuse muscular tenderness. Initial workup with MRI of brain and spine were unremarkable with no evidence of demyelinating disease. Patient had intact reflexes and isolated muscle groups appeared to have intact strength. Presentation was less concerning for [name] Syndrome. Initial labs showed CK 33, CRP 2.0, sed rate 8, cortisol am 9.8, and no electrolyte abnormalities. Neurology began workup for rheumatologic disease, with several labs pending at the time of discharge (see below). If rheumatologic workup negative, differential includes inflammatory myositis vs fibromyalgia vs functional disorder. Neuro recommended outpatient follow up with Dr. [name] in 2-4 weeks. Patient was  evaluated by physical therapy and occupational therapy who recommended outpatient PT.    Items for PCP Follow-up: Follow up with psychiatry for bipolar disorder to optimize treatment.  Follow up on autoimmune lab results.  Outpatient PT follow up.  Follow up with Neurology, Dr. [name] . Patient has No headache, No chest pain, No abdominal pain - No Nausea, No new weakness tingling or numbness, No Cough - SOB.  No problems updated.  ALLERGIES: Allergies  Allergen Reactions   Other     Bananas, kiwi   Wellbutrin [Bupropion] Hives    PAST MEDICAL HISTORY: Past Medical History:  Diagnosis Date   Anxiety    Depression    [employer]reesia 06/30/2020   GERD (gastroesophageal reflux disease)    History of kidney stones    Pneumonitis 03/02/2019    MEDICATIONS AT HOME: Prior to Admission medications   Medication Sig Start Date End Date Taking? Authorizing Provider  acetaminophen (TYLENOL) 500 MG tablet Take 1,000 mg by mouth every 6 (six) hours as needed for moderate pain.   Yes [provider]  albuterol (VENTOLIN HFA) 108 (90 Base) MCG/ACT inhaler Inhale 2 puffs into the lungs every 6 (six) hours as needed for wheezing or shortness of breath. 06/26/21  Yes Lynden Oxford Scales, PA-C  cetirizine (ZYRTEC) 10 MG tablet Take 1 tablet (10 mg total) by mouth daily. 06/12/21  Yes [name], MD  Cholecalciferol (VITAMIN D3) 50 MCG (2000 UT) TABS Take 1 oral tablet once a day 05/14/21  Yes   escitalopram (LEXAPRO)  20 MG tablet Take 1 tablet (20 mg total) by mouth at bedtime. 06/26/21 12/23/21 Yes Theadora Rama Scales, PA-C  fluticasone-salmeterol (ADVAIR DISKUS) 100-50 MCG/ACT AEPB Inhale 1 puff into the lungs 2 (two) times daily. 06/26/21  Yes Theadora Rama Scales, PA-C  guaiFENesin (MUCINEX PO) Take 1 capsule by mouth daily as needed (congestion).   Yes [provider]  ibuprofen (ADVIL) 200 MG tablet Take 600 mg by mouth every 6 (six) hours as needed for moderate pain.    Yes [provider]  lamoTRIgine (LAMICTAL) 200 MG tablet Take 1 tablet (200 mg total) by mouth 2 (two) times daily. 06/26/21 12/23/21 Yes Theadora Rama Scales, PA-C  lansoprazole (PREVACID) 30 MG capsule Take 1 capsule (30 mg total) by mouth daily at 12 noon. 06/26/21 12/23/21 Yes Theadora Rama Scales, PA-C  methocarbamol (ROBAXIN) 500 MG tablet Take 1 tablet (500 mg total) by mouth every 6 (six) hours as needed for muscle spasms. 07/24/21  Yes Malakye Nolden M, PA-C  montelukast (SINGULAIR) 10 MG tablet Take 1 tablet (10 mg total) by mouth at bedtime. 06/26/21 12/23/21 Yes Theadora Rama Scales, PA-C  pramipexole (MIRAPEX) 1.5 MG tablet Take 1 tablet (1.5 mg total) by mouth at bedtime. 06/26/21 12/23/21 Yes Theadora Rama Scales, PA-C  sodium chloride (OCEAN) 0.65 % SOLN nasal spray Place 1 spray into both nostrils as needed for congestion. 07/13/21  Yes Storm Frisk, MD  thiamine 100 MG tablet Take 100 mg by mouth daily.   Yes [provider]  triamcinolone cream (KENALOG) 0.1 % Apply 1 application topically 2 (two) times daily. 07/24/21  Yes Yelina Sarratt M, PA-C    ROS: Neg HEENT Neg resp Neg cardiac Neg GI Neg GU Neg psych Neg neuro  Objective:   Vitals:   07/24/21 0955  BP: 133/83  Pulse: 96  SpO2: 100%  Height: 5\' 4"  (1.626 m)   Exam General appearance : Awake, alert, not in any distress. Speech Clear. Not toxic looking HEENT: Atraumatic and Normocephalic, pupils equally reactive to light and accomodation Neck: Supple, no JVD. No cervical lymphadenopathy.  Chest: Good air entry bilaterally, CTAB.  No rales/rhonchi/wheezing CVS: S1 S2 regular, no murmurs.  BUE and BLE with =and good strength B.   Extremities: B/L Lower Ext shows no edema, both legs are warm to touch Neurology: Awake alert, and oriented X 3, CN II-XII intact, Non focal Skin: No Rash  Data Review Lab Results  Component Value Date   HGBA1C 5.0 01/24/2017    Assessment &  Plan   1. Weakness Much improved.  Keep f/up with Dr 01/26/2017 - Comprehensive metabolic panel - Ambulatory referral to Physical Therapy  2. Elevated C-reactive protein (CRP) Drink 80-100 ounces water daily - Comprehensive metabolic panel - C-reactive protein  3. Leukocytosis, unspecified type - CBC with Differential/Platelet  4. Eczema, unspecified type - triamcinolone cream (KENALOG) 0.1 %; Apply 1 application topically 2 (two) times daily.  Dispense: 45 g; Refill: 1  5. Muscle spasm - Comprehensive metabolic panel - Ambulatory referral to Physical Therapy - methocarbamol (ROBAXIN) 500 MG tablet; Take 1 tablet (500 mg total) by mouth every 6 (six) hours as needed for muscle spasms.  Dispense: 90 tablet; Refill: 1  6. Hyperglycemia I have had a lengthy discussion and provided education about insulin resistance and the intake of too much sugar/refined carbohydrates.  I have advised the patient to work at a goal of eliminating sugary drinks, candy, desserts, sweets, refined sugars, processed foods, and white carbohydrates.  The  patient expresses understanding.  - Hemoglobin A1c; Future  7. Poor dentition - Ambulatory referral to Dentistry  8. Hospital discharge follow-up Overall improving   Patient have been counseled extensively about nutrition and exercise.   Return in about 2 months (around 09/24/2021) for PCP for chronic conditions.  The patient was given clear instructions to go to ER or return to medical center if symptoms don't improve, worsen or new problems develop. The patient verbalized understanding. The patient was told to call to get lab results if they haven't heard anything in the next week.      Freeman Caldron, PA-C [facility] [place], [place]   07/24/2021, 10:21 AM

## 2021-07-25 ENCOUNTER — Other Ambulatory Visit: Payer: Self-pay

## 2021-07-25 LAB — CBC WITH DIFFERENTIAL/PLATELET
Basophils Absolute: 0 10*3/uL (ref 0.0–0.2)
Basos: 1 %
EOS (ABSOLUTE): 0.5 10*3/uL — ABNORMAL HIGH (ref 0.0–0.4)
Eos: 6 %
Hematocrit: 36.9 % (ref 34.0–46.6)
Hemoglobin: 12.5 g/dL (ref 11.1–15.9)
Immature Grans (Abs): 0 10*3/uL (ref 0.0–0.1)
Immature Granulocytes: 1 %
Lymphocytes Absolute: 2.2 10*3/uL (ref 0.7–3.1)
Lymphs: 27 %
MCH: 29.5 pg (ref 26.6–33.0)
MCHC: 33.9 g/dL (ref 31.5–35.7)
MCV: 87 fL (ref 79–97)
Monocytes Absolute: 0.6 10*3/uL (ref 0.1–0.9)
Monocytes: 7 %
Neutrophils Absolute: 4.8 10*3/uL (ref 1.4–7.0)
Neutrophils: 58 %
Platelets: 311 10*3/uL (ref 150–450)
RBC: 4.24 x10E6/uL (ref 3.77–5.28)
RDW: 14.2 % (ref 11.7–15.4)
WBC: 8.2 10*3/uL (ref 3.4–10.8)

## 2021-07-25 LAB — COMPREHENSIVE METABOLIC PANEL
ALT: 24 IU/L (ref 0–32)
AST: 26 IU/L (ref 0–40)
Albumin/Globulin Ratio: 2.6 — ABNORMAL HIGH (ref 1.2–2.2)
Albumin: 4.2 g/dL (ref 3.8–4.8)
Alkaline Phosphatase: 106 IU/L (ref 44–121)
BUN/Creatinine Ratio: 17 (ref 9–23)
BUN: 12 mg/dL (ref 6–20)
Bilirubin Total: 0.3 mg/dL (ref 0.0–1.2)
CO2: 25 mmol/L (ref 20–29)
Calcium: 9.3 mg/dL (ref 8.7–10.2)
Chloride: 103 mmol/L (ref 96–106)
Creatinine, Ser: 0.72 mg/dL (ref 0.57–1.00)
Globulin, Total: 1.6 g/dL (ref 1.5–4.5)
Glucose: 82 mg/dL (ref 70–99)
Potassium: 4.5 mmol/L (ref 3.5–5.2)
Sodium: 139 mmol/L (ref 134–144)
Total Protein: 5.8 g/dL — ABNORMAL LOW (ref 6.0–8.5)
eGFR: 112 mL/min/{1.73_m2} (ref >59)

## 2021-07-25 LAB — C-REACTIVE PROTEIN: CRP: 14 mg/L — ABNORMAL HIGH (ref 0–10)

## 2021-07-26 ENCOUNTER — Telehealth: Payer: Self-pay | Admitting: Critical Care Medicine

## 2021-07-26 NOTE — Telephone Encounter (Signed)
Copied from CRM [id number]. Topic: General - Other >> Jul 25, 2021 10:27 AM Glean Salen wrote: Reason for TEL:MRAJHHI called in wanted to speak to the nurse about things that are in her chart that she says is incorrect. Please call back

## 2021-07-26 NOTE — Telephone Encounter (Signed)
Called number on pt chart and was told it was the wring number

## 2021-08-07 ENCOUNTER — Other Ambulatory Visit: Payer: Self-pay

## 2021-08-07 ENCOUNTER — Other Ambulatory Visit (HOSPITAL_COMMUNITY): Payer: Self-pay

## 2021-08-07 ENCOUNTER — Ambulatory Visit: Payer: No Typology Code available for payment source | Admitting: Internal Medicine

## 2021-08-07 ENCOUNTER — Other Ambulatory Visit: Payer: Self-pay | Admitting: Critical Care Medicine

## 2021-08-07 ENCOUNTER — Encounter: Payer: Self-pay | Admitting: Critical Care Medicine

## 2021-08-07 ENCOUNTER — Ambulatory Visit (INDEPENDENT_AMBULATORY_CARE_PROVIDER_SITE_OTHER): Payer: No Typology Code available for payment source

## 2021-08-07 ENCOUNTER — Other Ambulatory Visit: Payer: Self-pay | Admitting: Internal Medicine

## 2021-08-07 VITALS — BP 122/74 | HR 103 | Ht 64.0 in | Wt 310.0 lb

## 2021-08-07 DIAGNOSIS — G90A Postural orthostatic tachycardia syndrome (POTS): Secondary | ICD-10-CM

## 2021-08-07 DIAGNOSIS — R531 Weakness: Secondary | ICD-10-CM

## 2021-08-07 DIAGNOSIS — R55 Syncope and collapse: Secondary | ICD-10-CM

## 2021-08-07 DIAGNOSIS — R42 Dizziness and giddiness: Secondary | ICD-10-CM

## 2021-08-07 LAB — URINALYSIS, ROUTINE W REFLEX MICROSCOPIC
Bilirubin, UA: NEGATIVE
Glucose, UA: NEGATIVE
Ketones, UA: NEGATIVE
Leukocytes,UA: NEGATIVE
Nitrite, UA: NEGATIVE
Protein,UA: NEGATIVE
Specific Gravity, UA: 1.012 (ref 1.005–1.030)
Urobilinogen, Ur: 0.2 mg/dL (ref 0.2–1.0)
pH, UA: 6 (ref 5.0–7.5)

## 2021-08-07 LAB — MICROSCOPIC EXAMINATION
Bacteria, UA: NONE SEEN
Casts: NONE SEEN /lpf
Epithelial Cells (non renal): NONE SEEN /hpf (ref 0–10)
WBC, UA: NONE SEEN /hpf (ref 0–5)

## 2021-08-07 MED ORDER — FLUTICASONE FUROATE-VILANTEROL 100-25 MCG/ACT IN AEPB
1.0000 | INHALATION_SPRAY | Freq: Every day | RESPIRATORY_TRACT | 11 refills | Status: DC
  Filled 2021-08-07 – 2021-08-10 (×2): qty 1, 1d supply, fill #0

## 2021-08-07 NOTE — Progress Notes (Signed)
Cardiology Office Note   Date:  08/07/2021   ID:  [name] [name], DOB 1985-10-19, MRN [id number]  PCP:  [name], MD  Cardiologist:   Dorris Carnes, MD   Patient referred for evaluation of weakness   History of Present Illness: [name] [name] is a 36 y.o. female with a history of anxiety, depression, morbid obesity and weakness     The pt says in November she developed fatigue, SOB, sinus congestion, rash on neck   She had been treated with Augmentin   Went to Urgent Care   Rx prednisone for asthma/allergies   The pt says she fainted after   Says she stopped one of her mental health drugs    Got migraine   Went to bed  Woke up, vomited  Passed out   After this she felt dizzy   Overheated   No furthe syncope   The pt says she then developed weakness in legs, problems standing, getting up from chair.  MRI ordered  She was set up for neuro evaluation.   Seen by [name]   She was hospitalized on Dec 5   MRI done   Neuro consulted   Serologies drawn.    The patient says  she has been sick on/off  over summer    Had to quit her job in October because she missed work from illness or fatigue    The pt says she continues to feel weak   Stays in upper floor of house most of day   Has problems with stairs    Gets SOB with minmal acitvity AM Gets up   Has breadfast   Doesn't go down often    Most of time doing art   Paint, sew  Takes nap in afternoon    Sometimes goes out   Gets ride   [name]  Diet  AM   coffee with creamer    Oatmeal with walnuts Lunch   Pasta Dinner   Rice and beans Snacks:  Crackers, chips,kind bars, sweets  Fluids  Coffee and water   Cold brew with oat mild or hot with Water:    3 per day     Current Meds  Medication Sig   acetaminophen (TYLENOL) 500 MG tablet Take 1,000 mg by mouth every 6 (six) hours as needed for moderate pain.   albuterol (VENTOLIN HFA) 108 (90 Base) MCG/ACT inhaler Inhale 2 puffs into the lungs every 6 (six) hours as needed for  wheezing or shortness of breath.   cetirizine (ZYRTEC) 10 MG tablet Take 1 tablet (10 mg total) by mouth daily.   Cholecalciferol (VITAMIN D3) 50 MCG (2000 UT) TABS Take 1 oral tablet once a day   escitalopram (LEXAPRO) 20 MG tablet Take 1 tablet (20 mg total) by mouth at bedtime.   fluticasone-salmeterol (ADVAIR DISKUS) 100-50 MCG/ACT AEPB Inhale 1 puff into the lungs 2 (two) times daily.   guaiFENesin (MUCINEX PO) Take 1 capsule by mouth daily as needed (congestion).   ibuprofen (ADVIL) 200 MG tablet Take 600 mg by mouth every 6 (six) hours as needed for moderate pain.   lamoTRIgine (LAMICTAL) 200 MG tablet Take 1 tablet (200 mg total) by mouth 2 (two) times daily.   lansoprazole (PREVACID) 30 MG capsule Take 1 capsule (30 mg total) by mouth daily at 12 noon.   methocarbamol (ROBAXIN) 500 MG tablet Take 1 tablet (500 mg total) by mouth every 6 (six) hours as needed for muscle spasms.  montelukast (SINGULAIR) 10 MG tablet Take 1 tablet (10 mg total) by mouth at bedtime.   pramipexole (MIRAPEX) 1.5 MG tablet Take 1 tablet (1.5 mg total) by mouth at bedtime.   thiamine 100 MG tablet Take 100 mg by mouth daily.     Allergies:   Other and Wellbutrin [bupropion]   Past Medical History:  Diagnosis Date   Anxiety    Depression    [employer] 06/30/2020   GERD (gastroesophageal reflux disease)    History of kidney stones    Pneumonitis 03/02/2019    Past Surgical History:  Procedure Laterality Date   CYSTOSCOPY WITH RETROGRADE PYELOGRAM, URETEROSCOPY AND STENT PLACEMENT Right 12/10/2016   Procedure: CYSTOSCOPY WITH RETROGRADE PYELOGRAM, URETEROSCOPY AND STONE REMOVAL;  Surgeon: Raynelle Bring, MD;  Location: [facility];  Service: Urology;  Laterality: Right;   WISDOM TOOTH EXTRACTION  at age 36     Social History:  The patient  reports that she has never smoked. She has never used smokeless tobacco. She reports current alcohol use of about 1.0 standard drink per week. She reports that she does  not use drugs.   Family History:  The patient's family history includes Anxiety disorder in her mother; Depression in her mother.    ROS:  Please see the history of present illness. All other systems are reviewed and  Negative to the above problem except as noted.    PHYSICAL EXAM: VS:  BP 122/74    Pulse (!) 103    Ht 5\' 4"  (1.626 m)    Wt (!) 310 lb (140.6 kg)    BMI 53.21 kg/m   BP 122/74  P 106   Sitting 106 1234/76    Standing 0 min 112/  134/76 GEN: Morbidly obese 36 yo  in no acute distress  HEENT: normal  Neck: no JVD, carotid bruits Cardiac: RRR; no murmurs  NO LE edema  Respiratory:  clear to auscultation bilaterally GI: soft, nontender, nondistended, + BS  No hepatomegaly  MS: no deformity Moving all extremities   Skin: warm and dry, no rash Neuro:  Strength and sensation are intact Psych: euthymic mood, full affect   EKG:  EKG is not  ordered today.   Lipid Panel    Component Value Date/Time   CHOL 175 01/24/2017 0611   TRIG 171 (H) 01/24/2017 0611   HDL 43 01/24/2017 0611   CHOLHDL 4.1 01/24/2017 0611   VLDL 34 01/24/2017 0611   LDLCALC 98 01/24/2017 0611      Wt Readings from Last 3 Encounters:  08/07/21 (!) 310 lb (140.6 kg)  07/09/21 280 lb (127 kg)  05/23/21 289 lb 8 oz (131.3 kg)      ASSESSMENT AND PLAN:  1  Weakness.   The pt developed weakness in fall after upper respiratory infection   Hospitalized briefly in early December    Set up to see neurology Talking to the pt she does give out easily    SOB with miniaml activity   She says this is very different from last summer and before  She had syncopal spell that sounds orthostatic   ? Due to missing psych med.    On exam, BP OK, actually increases with standing   HR a little high throughout but no increase with standing   Was a little dizzy with maneuvers She  probably has some autonomic dysfunction    I am not sure explains all of symptoms   May be postviral Discussed improtance of  adequate/increased  hydration, increased salt   Recomm frequ smaller meals that are not high in sugar/carbs     Activty is imprortant to keep muscle tone, optimize venous return  With tachycardia will set up for 48 hour monitor to evaluate average HR Will also set up for echo to confirm LV and RV function.      Get UA today to see S.G.     Will follow up later this spring   She has appt in neuro in Feb.      2  Morbid obesity   Discussed diet      3  Bipolar disorder ? If meds contrib to symptoms       Current medicines are reviewed at length with the patient today.  The patient does not have concerns regarding medicines.  Signed, Dorris Carnes, MD  08/07/2021 9:46 AM    [address], [place], [place]  [address] Phone: [phone]; Fax: [phone]

## 2021-08-07 NOTE — Patient Instructions (Signed)
Medication Instructions:  Your physician recommends that you continue on your current medications as directed. Please refer to the Current Medication list given to you today.   *If you need a refill on your cardiac medications before your next appointment, please call your pharmacy*   Lab Work: UA today  If you have labs (blood work) drawn today and your tests are completely normal, you will receive your results only by: MyChart Message (if you have MyChart) OR A paper copy in the mail If you have any lab test that is abnormal or we need to change your treatment, we will call you to review the results.   Testing/Procedures: Your physician has requested that you have an echocardiogram. Echocardiography is a painless test that uses sound waves to create images of your heart. It provides your doctor with information about the size and shape of your heart and how well your hearts chambers and valves are working. This procedure takes approximately one hour. There are no restrictions for this procedure.  ZIO XT- Long Term Monitor Instructions  Your physician has requested you wear a ZIO patch monitor for 2 days.  This is a single patch monitor. [employer] supplies one patch monitor per enrollment. Additional stickers are not available. Please do not apply patch if you will be having a Nuclear Stress Test,  Echocardiogram, Cardiac CT, MRI, or Chest Xray during the period you would be wearing the  monitor. The patch cannot be worn during these tests. You cannot remove and re-apply the  ZIO XT patch monitor.  Your ZIO patch monitor will be mailed 3 day USPS to your address on file. It may take 3-5 days  to receive your monitor after you have been enrolled.  Once you have received your monitor, please review the enclosed instructions. Your monitor  has already been registered assigning a specific monitor serial # to you.  Billing and Patient Assistance Program Information  We have supplied  [employer] with any of your insurance information on file for billing purposes. [employer] offers a sliding scale Patient Assistance Program for patients that do not have  insurance, or whose insurance does not completely cover the cost of the ZIO monitor.  You must apply for the Patient Assistance Program to qualify for this discounted rate.  To apply, please call [employer] at [phone], select option 4, select option 2, ask to apply for  Patient Assistance Program. Meredeth Ide will ask your household income, and how many people  are in your household. They will quote your out-of-pocket cost based on that information.  [employer] will also be able to set up a 12-month, interest-free payment plan if needed.  Applying the monitor   Shave hair from upper left chest.  Hold abrader disc by orange tab. Rub abrader in 40 strokes over the upper left chest as  indicated in your monitor instructions.  Clean area with 4 enclosed alcohol pads. Let dry.  Apply patch as indicated in monitor instructions. Patch will be placed under collarbone on left  side of chest with arrow pointing upward.  Rub patch adhesive wings for 2 minutes. Remove white label marked "1". Remove the white  label marked "2". Rub patch adhesive wings for 2 additional minutes.  While looking in a mirror, press and release button in center of patch. A small green light will  flash 3-4 times. This will be your only indicator that the monitor has been turned on.  Do not shower for the first 24 hours. You may shower  after the first 24 hours.  Press the button if you feel a symptom. You will hear a small click. Record Date, Time and  Symptom in the Patient Logbook.  When you are ready to remove the patch, follow instructions on the last 2 pages of Patient  Logbook. Stick patch monitor onto the last page of Patient Logbook.  Place Patient Logbook in the blue and white box. Use locking tab on box and tape box closed  securely. The blue and white box  has prepaid postage on it. Please place it in the mailbox as  soon as possible. Your physician should have your test results approximately 7 days after the  monitor has been mailed back to [facility].  Call [facility] Customer Care at [phone] if you have questions regarding  your ZIO XT patch monitor. Call them immediately if you see an orange light blinking on your  monitor.  If your monitor falls off in less than 4 days, contact our Monitor department at [phone].  If your monitor becomes loose or falls off after 4 days call [employer] at [phone] for  suggestions on securing your monitor    Follow-Up: At [facility], you and your health needs are our priority.  As part of our continuing mission to provide you with exceptional heart care, we have created designated Provider Care Teams.  These Care Teams include your primary Cardiologist (physician) and Advanced Practice Providers (APPs -  Physician Assistants and Nurse Practitioners) who all work together to provide you with the care you need, when you need it.  We recommend signing up for the patient portal called "MyChart".  Sign up information is provided on this After Visit Summary.  MyChart is used to connect with patients for Virtual Visits (Telemedicine).  Patients are able to view lab/test results, encounter notes, upcoming appointments, etc.  Non-urgent messages can be sent to your provider as well.   To learn more about what you can do with MyChart, go to [email or web address].    Your next appointment:   3 month(s)  The format for your next appointment:   In Person  Provider:   Dr. Dietrich Pates     Other Instructions

## 2021-08-07 NOTE — Progress Notes (Unsigned)
Enrolled for [employer] to mail a ZIO XT long term holter monitor to the patients address on file.  

## 2021-08-08 ENCOUNTER — Other Ambulatory Visit (HOSPITAL_COMMUNITY): Payer: Self-pay

## 2021-08-10 ENCOUNTER — Other Ambulatory Visit: Payer: Self-pay

## 2021-08-10 ENCOUNTER — Other Ambulatory Visit (HOSPITAL_COMMUNITY): Payer: Self-pay

## 2021-08-12 ENCOUNTER — Other Ambulatory Visit: Payer: Self-pay

## 2021-08-12 MED ORDER — FLUTICASONE FUROATE-VILANTEROL 100-25 MCG/ACT IN AEPB
1.0000 | INHALATION_SPRAY | Freq: Every day | RESPIRATORY_TRACT | 11 refills | Status: DC
Start: 2021-08-07 — End: 2022-07-03
  Filled 2021-08-12: qty 60, 30d supply, fill #0
  Filled 2021-09-12: qty 60, 30d supply, fill #1
  Filled 2021-10-09 – 2021-10-11 (×3): qty 60, 30d supply, fill #2
  Filled 2021-11-07: qty 180, 90d supply, fill #3
  Filled 2022-01-31 – 2022-02-13 (×6): qty 180, 90d supply, fill #4
  Filled 2022-04-08 – 2022-05-05 (×4): qty 180, 90d supply, fill #5

## 2021-08-13 ENCOUNTER — Other Ambulatory Visit: Payer: Self-pay

## 2021-08-13 MED ORDER — PRAMIPEXOLE DIHYDROCHLORIDE 1.5 MG PO TABS
ORAL_TABLET | ORAL | 0 refills | Status: DC
  Filled 2021-08-13: qty 30, 30d supply, fill #0
  Filled 2021-09-12: qty 30, 30d supply, fill #1

## 2021-08-13 MED ORDER — LITHIUM CARBONATE ER 300 MG PO TBCR
EXTENDED_RELEASE_TABLET | ORAL | 0 refills | Status: DC
  Filled 2021-08-13: qty 30, 30d supply, fill #0
  Filled 2021-09-12: qty 30, 30d supply, fill #1

## 2021-08-13 MED ORDER — SULINDAC 150 MG PO TABS
ORAL_TABLET | ORAL | 2 refills | Status: DC
  Filled 2021-08-13: qty 60, 30d supply, fill #0

## 2021-08-13 MED ORDER — LAMOTRIGINE 200 MG PO TABS
ORAL_TABLET | ORAL | 0 refills | Status: DC
  Filled 2021-09-12: qty 30, 30d supply, fill #0

## 2021-08-13 MED ORDER — ESCITALOPRAM OXALATE 20 MG PO TABS
ORAL_TABLET | ORAL | 1 refills | Status: DC
  Filled 2021-09-12: qty 30, 30d supply, fill #0

## 2021-08-14 ENCOUNTER — Telehealth: Payer: Self-pay | Admitting: *Deleted

## 2021-08-14 ENCOUNTER — Other Ambulatory Visit: Payer: Self-pay

## 2021-08-14 DIAGNOSIS — R42 Dizziness and giddiness: Secondary | ICD-10-CM

## 2021-08-14 DIAGNOSIS — G90A Postural orthostatic tachycardia syndrome (POTS): Secondary | ICD-10-CM

## 2021-08-14 DIAGNOSIS — R531 Weakness: Secondary | ICD-10-CM

## 2021-08-14 DIAGNOSIS — R55 Syncope and collapse: Secondary | ICD-10-CM

## 2021-08-14 NOTE — Telephone Encounter (Signed)
I called the patient and informed her the Clinician has taken out the 'Hypertension' from her notes. She did have elevated blood sugar on a non fasting hospital blood draw which normalized on repeat in the Clinic. The Clinician stated the patient received counseling which the patient declined. I informed her we were happy to receive her feedback anytime and she was thankful that I had called.

## 2021-08-14 NOTE — Telephone Encounter (Signed)
Copied from [place] [phone]. Topic: Complaint - Provider (sensitive) >> Jul 25, 2021 12:09 PM Scherrie Gerlach wrote: Date of Incident: 07/24/2021 Details of complaint: pt states her AVS had incorrect information.  Pt has never had issues w/ Hyperglycemia, and hypertension . The AVS stated these 2 issues were discussed , and that never happened.  Pt feels like due to her appearance (she is a big girl) she was judged by that.  Pt states AVS also states she was counseled on diet and nutrician.and that never happened either. How would the patient like to see it resolved? Pt would like to have this information removed, and the dr noticfied for some accountability On a scale of 1-10, how was your experience? 2 What would it take to bring it to a 10?  Resolution. Because if the dr was concerned for these issues, then why did she not talk about it?  Route to Engineer, building services.

## 2021-08-15 ENCOUNTER — Other Ambulatory Visit: Payer: Self-pay

## 2021-08-15 ENCOUNTER — Ambulatory Visit: Payer: Self-pay | Attending: Physician Assistant | Admitting: Physical Therapy

## 2021-08-15 ENCOUNTER — Encounter: Payer: Self-pay | Admitting: Physical Therapy

## 2021-08-15 DIAGNOSIS — M6281 Muscle weakness (generalized): Secondary | ICD-10-CM | POA: Insufficient documentation

## 2021-08-15 DIAGNOSIS — M62838 Other muscle spasm: Secondary | ICD-10-CM | POA: Insufficient documentation

## 2021-08-15 DIAGNOSIS — R2681 Unsteadiness on feet: Secondary | ICD-10-CM | POA: Insufficient documentation

## 2021-08-15 DIAGNOSIS — R296 Repeated falls: Secondary | ICD-10-CM | POA: Insufficient documentation

## 2021-08-15 DIAGNOSIS — R252 Cramp and spasm: Secondary | ICD-10-CM | POA: Insufficient documentation

## 2021-08-15 DIAGNOSIS — R531 Weakness: Secondary | ICD-10-CM | POA: Insufficient documentation

## 2021-08-15 NOTE — Therapy (Signed)
OUTPATIENT PHYSICAL THERAPY CERVICAL and THORACOLUMBAR EVALUATION   Patient Name: [name] [name] MRN: [id number] DOB:1986-05-03, 36 y.o., female Today's Date: 08/15/2021   PT End of Session - 08/15/21 1158     Visit Number 1    Number of Visits 16    Date for PT Re-Evaluation 10/10/21    Authorization Type CAFA    PT Start Time 1020    PT Stop Time 1115    PT Time Calculation (min) 55 min    Activity Tolerance Patient tolerated treatment well;Patient limited by fatigue    [facility] tasks assessed/performed             Past Medical History:  Diagnosis Date   Anxiety    Depression    [employer]reesia 06/30/2020   GERD (gastroesophageal reflux disease)    History of kidney stones    Pneumonitis 03/02/2019   Past Surgical History:  Procedure Laterality Date   CYSTOSCOPY WITH RETROGRADE PYELOGRAM, URETEROSCOPY AND STENT PLACEMENT Right 12/10/2016   Procedure: CYSTOSCOPY WITH RETROGRADE PYELOGRAM, URETEROSCOPY AND STONE REMOVAL;  Surgeon: [name] [name], Lester, MD;  Location: [facility];  Service: Urology;  Laterality: Right;   WISDOM TOOTH EXTRACTION  at age 36   Patient Active Problem List   Diagnosis Date Noted   Lower extremity weakness 07/08/2021   Weakness 07/08/2021   Brain fog 06/12/2021   Sinusitis, acute 01/01/2021   Other atopic dermatitis 04/16/2020   Food allergy 01/12/2020   Class 3 severe obesity due to excess calories without serious comorbidity with body mass index (BMI) of 45.0 to 49.9 in adult [facility](HCC) 09/06/2019   Eczema of external ear, 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   Asthma, moderate persistent 03/02/2019   Other allergic rhinitis 03/02/2019   Mold suspected exposure 03/02/2019    PCP: [name] [name], [name] E, MD  REFERRING PROVIDER: Anders [name], [name] M, PA-C  REFERRING DIAG:  R53.1 (ICD-10-CM) - Weakness  [phone] (ICD-10-CM) - Muscle spasm    THERAPY DIAG:  Muscle weakness (generalized)  Cramp and spasm  Repeated falls  Unsteadiness on  feet  ONSET DATE: End of November 2022  SUBJECTIVE:                                                                                                                                                                                           SUBJECTIVE STATEMENT: I have been screened for many things, [name] MiresGuillain Barre, MS and other autoimmune dz. 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  I saw a cardiologist last week to rule out cardio. It has gone from weakness and now I have pain.  The pain is in both my  hips and upper arms and back. I wake up and I get worse during the day  PERTINENT HISTORY:  Pt presented to ED twice once on 11-29 -22 and 07-08-21 and  moderate persistent asthma, Bipolar I disorder, chronic allergic rhinitis,  PAIN:  Are you having pain? Yes NPRS scale: 2/10 at end of exercise 2/10 Pain location: generalized but bil hips, ribs are sore generally Pain orientation: Other: Generalized   PAIN TYPE: aching and hypersensitivity Pain description: intermittent, constant, and aching  constant at end of day Aggravating factors: movement. Walking going up and down steps, reaching for objects Relieving factors: nothing  PRECAUTIONS: Fall  WEIGHT BEARING RESTRICTIONS No  FALLS:  Has patient fallen in last 6 months? Yes, Number of falls: 2  when I fainted as I got up to throw up due to migraine and I passed out as I rose from bed - orthostatic and  One time I got overheated and fell/stumbled to the floor as I let myself fall to the floor in a more controlled way  I felt faint and like I was going to pass out.  LIVING ENVIRONMENT: Lives with: lives with their spouse Lives in: House/apartment town house Stairs: Yes; Internal: 12 steps; on left going up Has following equipment at home: Quad cane small base a Rollator out in community  OCCUPATION: Unemployed  PLOF: Independent  I could go up and down  PATIENT GOALS I want to be independent  I want to get back to driving myself.  I  want to go up and down the stairs easily and by myself.   OBJECTIVE:   DIAGNOSTIC FINDINGS: 07/02/21 IMPRESSION: Normal MRI of the thoracic and lumbar spine. IMPRESSION: Normal MRI of the cervical spine. No evidence of demyelinating disease.  PATIENT SURVEYS:  FOTO 59% intake  SCREENING FOR RED FLAGS: Bowel or bladder incontinence: No Spinal tumors: No Cauda equina syndrome: No Compression fracture: No Abdominal aneurysm: No  COGNITION:  Overall cognitive status: Within functional limits for tasks assessed     SENSATION:  Light touch: Deficits hypersensitive to touch feels bruised with light touch  Stereognosis: Appears intact  Hot/Cold: Appears intact  Proprioception: Appears intact  MUSCLE LENGTH: Hamstrings: Right 70 deg; Left 70 deg   POSTURE:  Obese with increased abdominal girth  PALPATION: Pt feels overall general tenderness to light palpation some more intense over bil hips.   CERVICAL AROM/PROM  A/PROM A/PROM (deg) 08/15/2021  Flexion 54  Extension 30  Right lateral flexion 20  Left lateral flexion 21  Right rotation 40  Left rotation 45   (Blank rows = not tested)  UE AROM/PROM:  A/PROM Right 08/15/2021 Left 08/15/2021  Shoulder flexion 155 156  Shoulder extension 150 150  Shoulder abduction    Shoulder adduction    Shoulder extension    Shoulder internal rotation    Shoulder external rotation     (Blank rows = not tested)  UE MMT:  MMT Right 08/15/2021 Left 08/15/2021  Shoulder flexion 4/5 4/5  Shoulder extension 4+/5 4+/5  Shoulder abduction 4/5 4/5  Shoulder adduction    Grip strength     (Blank rows = not tested)  CERVICAL SPECIAL TESTS:  Upper limb tension test (ULTT): Negative and Spurling's test: Negative   LUMBARAROM/PROM  A/PROM A/PROM  08/15/2021  Flexion 50%  Extension 80%  Right lateral flexion 75%  Left lateral flexion 75%  Right rotation 75%  Left rotation 75%   (Blank rows = not tested)  LE  AROM/PROM:  A/PROM Right 08/15/2021 Left 08/15/2021  Hip flexion 110 110  Hip extension    Hip abduction    Hip adduction    Hip internal rotation    Hip external rotation    Knee flexion 120 115  Knee extension 0 0  Ankle dorsiflexion    Ankle plantarflexion    Ankle inversion    Ankle eversion     (Blank rows = not tested)  LE MMT:  MMT Right 08/15/2021 Left 08/15/2021  Hip flexion 4-/5 4-/5  Hip extension 4-/5 4-/5  Hip abduction 3+/5 3+/5  Hip adduction    Hip internal rotation    Hip external rotation    Knee flexion 4/5 4/5  Knee extension 4/5 4/5  Ankle dorsiflexion    Ankle plantarflexion    Ankle inversion    Ankle eversion     (Blank rows = not tested)  LUMBAR SPECIAL TESTS:  Straight leg raise test: Negative and Slump test: Negative  FUNCTIONAL TESTS:  5 times sit to stand: 14.57 sec 6 minute walk test: 459 feet  161/95 bp  105 HR initial Eval needing one rest break at 5 min Berg Balance Scale: 52  Pt with sm base quad cane  GAIT: Distance walked: 459 for 6 min walk test, needs a sit break at 5 minutes Assistive device utilized: Quad cane small base Level of assistance: SBA Comments: Pt BERG and 5 x STS not indicative of balance issues. Pt with weakness and endurance issues    TODAY'S TREATMENT   Build endurance with walking   start at 5 min and add every 2-3 days. Education on building endurance.  RPE chart Exercises  Sit to Stand with Counter Support - 3 x daily - 7 x weekly - 1-2 sets - 10 reps Movement snacks throughout the day    PATIENT EDUCATION:  Education details: POC  Explanation of findings.  Walking program basic , PRE chart. FOTO report. Intial HEP Person educated: Patient Education method: Explanation, Demonstration, Tactile cues, Verbal cues, and Handouts Education comprehension: verbalized understanding, returned demonstration, verbal cues required, and needs further education   HOME EXERCISE PROGRAM: Access Code:  [email or web address] URL: [email or web address] Date: 08/15/2021 Prepared by: Garen LahLawrie Octavius Shin  ASSESSMENT:  CLINICAL IMPRESSION: Patient is a 36 y.o. female who had recent 2 trips to ED and a hospital admission for generalized weakness and bil LE weakness x 10 days in November 2022.  Pt ruled out demyelinating dz/Guillan Irven EasterlyBarre and is now being worked up for rheumatology dx who was seen today for physical therapy evaluation and treatment for generalized weakness and muscle spasms. Pt 6 min walk test 459 ft and needed rest break at 5 min. Pt with 1.36 ft/sec gait velocity. Pt has had 2 falls due to fainting/orthostatic hypotension and true balance deficit. Has poor endurance and generalized weakness about 4-/5 to 4/5 grossly. Objective impairments include cardiopulmonary status limiting activity, decreased activity tolerance, decreased balance, decreased knowledge of use of DME, difficulty walking, decreased ROM, decreased strength, dizziness, increased muscle spasms, postural dysfunction, obesity, pain, and decreased endurance . These impairments are limiting patient from cleaning, driving, meal prep, laundry, and shopping. Personal factors including Behavior pattern are also affecting patient's functional outcome. Patient will benefit from skilled PT to address above impairments and improve overall function.  REHAB POTENTIAL: Good  CLINICAL DECISION MAKING: Evolving/moderate complexity  EVALUATION COMPLEXITY: Moderate   GOALS: Goals reviewed with patient? Yes  SHORT TERM GOALS:  STG Name Target Date Goal status  1 Pt will  be independent with initial HEP Baseline: no knowledge 09/05/2021 INITIAL  2 Pt will be able to walk 10 minutes without a break Baseline: 5 min and must sit due to 9/10 PRE 09/05/2021 INITIAL  3 FOTO will improve from 59%   to   64%  indicating improved functional mobility.  Baseline: eval 59% 09/05/2021 INITIAL  4 Pt will be able to perform 5 x STS in 13.0 sec or less to  show improved LE strength Baseline: eval 14.57 sec 09/05/2021 INITIAL  5 Pt will be educated on falls prevention and stat 3 strategies  Baseline: 09/05/2021 INITIAL   LONG TERM GOALS:   LTG Name Target Date Goal status  1 Pt will be independent with advanced HEP Baseline:no  knowledge and poor endurance 10/10/2021 INITIAL  2 Pt will be able to drive to store and shop for 30 minutes without adverse effect Baseline: unable to drive presently due to endurance 10/10/2021 INITIAL  3 Pt will be able to negotiate 12 steps with good endurance and no pain Baseline: 10/10/2021 INITIAL  4 Pt will be able to stand for 30 minutes to complete household chores Baseline: 10/10/2021 INITIAL  5 FOTO will improve from 59%   to   64%  indicating improved functional mobility.   Baseline:eval 59% 10/10/2021 INITIAL  6 Pt will be able to increase gait velocity to at least 2.62 ft/sec in order to perform community ambulation Baseline: 1.36 ft/sec 10/10/2021 INITIAL   PLAN: PT FREQUENCY: 2x/week  PT DURATION: 8 weeks  PLANNED INTERVENTIONS: Therapeutic exercises, Therapeutic activity, Neuro Muscular re-education, Balance training, Gait training, Patient/Family education, Joint mobilization, Stair training, Vestibular training, Aquatic Therapy, Dry Needling, Electrical stimulation, Spinal mobilization, Cryotherapy, Moist heat, Taping, Manual therapy, and endurance training for generalized weakness  PLAN FOR NEXT SESSION: Progress HEP for building strength and endurance    Garen Lah, PT, ATRIC Certified Exercise Expert for the Aging Adult  08/15/21 12:02 PM Phone: [phone] Fax: [phone]

## 2021-08-15 NOTE — Patient Instructions (Signed)
Aquatic Therapy at [facility]-  What to Expect!  Where:   [facility] @ [place], [place] [address] Rehab phone [phone]  NOTE:  You will receive an automated phone message reminding you of your appt and it will say the appointment is at the [facility].          How to Prepare: Please make sure you drink 8 ounces of water about one hour prior to your pool session A caregiver may attend if needed with the patient to help assist as needed. A caregiver can sit in the pool room on chair. Please arrive IN YOUR SUIT and 15 minutes prior to your appointment - this helps to avoid delays in starting your session. Please make sure to attend to any toileting needs prior to entering the pool [place] rooms for changing are provided.   There is direct access to the pool deck form the locker room.  You can lock your belongings in a locker with lock provided. Once on the pool deck your therapist will ask if you have signed the Patient  Consent and Assignment of Benefits form before beginning treatment Your therapist may take your blood pressure prior to, during and after your session if indicated We usually try and create a home exercise program based on activities we do in the pool.  Please be thinking about who might be able to assist you in the pool should you need to participate in an aquatic home exercise program at the time of discharge if you need assistance.  Some patients do not want to or do not have the ability to participate in an aquatic home program - this is not a barrier in any way to you participating in aquatic therapy as part of your current therapy plan! After Discharge from PT, you can continue using home program at  the [facility], there is a drop-in fee for $5 ($45 a month)or for 60 years  or older $4.00 ($40 a month for seniors ) or any local [facility] pool.  Memberships for purchase are  available for gym/pool at [facility] LAST AQUATIC VISIT.  PLEASE MAKE SURE THAT YOU HAVE A [address]  APPOINTMENT SCHEDULED.   About the pool: Pool is located approximately 500 FT from the entrance of the building.  Please bring a support person if you need assistance traveling this      distance.   Your therapist will assist you in entering the water; there are two ways to           enter: stairs with railings, and a mechanical lift. Your therapist will determine the most appropriate way for you.  Water temperature is usually between 88-90 degrees  There may be up to 2 other swimmers in the pool at the same time  The pool deck is tile, please wear shoes with good traction if you prefer not to be barefoot.    Contact Info:  For appointment scheduling and cancellations:         Please call the [place] [facility]       All sessions are 45 minutes  Access Code: [email or web address] URL: [email or web address] Date: 08/15/2021 Prepared by: Garen Lah  Program Notes Build endurance with walking   start at 5 min and add every 2-3 days   Exercises Sit to Stand with Counter Support - 3 x daily - 7 x weekly - 1-2 sets - 10 reps  Garen Lah, PT, [facility] Certified Exercise Expert for the Aging Adult  08/15/21 11:18 AM Phone: [phone] Fax: [phone]

## 2021-08-20 NOTE — Therapy (Addendum)
OUTPATIENT PHYSICAL THERAPY TREATMENT NOTE/DISCHARGE NOTE  PHYSICAL THERAPY DISCHARGE SUMMARY  Visits from Start of Care: 2  Current functional level related to goals / functional outcomes: UNKNOWN   Remaining deficits: unknown   Education / Equipment: Initial HEP   Patient goals were not met. Patient is being discharged due to not returning since the last visit.  Last known visit below. Pt DC due to not attending scheduled appt. And past POC. Voncille Lo, PT, [place] Certified Exercise Expert for the Aging Adult  11/11/21 6:01 PM Phone: [phone] Fax: [phone]   Patient Name: [name] [name] MRN: [id number] DOB:May 27, 1986, 36 y.o.,, female Today's Date: 08/21/2021  PCP: [name], MD REFERRING PROVIDER: Argentina Donovan, PA-C   PT End of Session - 08/21/21 1500     Visit Number 2    Number of Visits 16    Date for PT Re-Evaluation 10/10/21    Authorization Type CAFA    PT Start Time 1500    PT Stop Time 1547    PT Time Calculation (min) 47 min    Activity Tolerance Patient tolerated treatment well;Patient limited by fatigue    Behavior During Therapy [facility] for tasks assessed/performed             Past Medical History:  Diagnosis Date   Anxiety    Depression    Phreesia 06/30/2020   GERD (gastroesophageal reflux disease)    History of kidney stones    Pneumonitis 03/02/2019   Past Surgical History:  Procedure Laterality Date   CYSTOSCOPY WITH RETROGRADE PYELOGRAM, URETEROSCOPY AND STENT PLACEMENT Right 12/10/2016   Procedure: CYSTOSCOPY WITH RETROGRADE PYELOGRAM, URETEROSCOPY AND STONE REMOVAL;  Surgeon: Raynelle Bring, MD;  Location: [facility];  Service: Urology;  Laterality: Right;   WISDOM TOOTH EXTRACTION  at age 36   Patient Active Problem List   Diagnosis Date Noted   Lower extremity weakness 07/08/2021   Weakness 07/08/2021   Brain fog 06/12/2021   Sinusitis, acute 01/01/2021   Other atopic dermatitis 04/16/2020   Food allergy  01/12/2020   Class 3 severe obesity due to excess calories without serious comorbidity with body mass index (BMI) of 45.0 to 49.9 in adult [facility]) 09/06/2019   Eczema of external ear, 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   Asthma, moderate persistent 03/02/2019   Other allergic rhinitis 03/02/2019   Mold suspected exposure 03/02/2019    REFERRING DIAG:  R53.1 (ICD-10-CM) - Weakness  M62.838 (ICD-10-CM) - Muscle spasm    THERAPY DIAG:  Muscle weakness (generalized)  Cramp and spasm  Repeated falls  Unsteadiness on feet  PERTINENT HISTORY: Pt presented to ED twice once on 11-29 -22 and 07-08-21 and  moderate persistent asthma, Bipolar I disorder, chronic allergic rhinitis,  PRECAUTIONS: Fall  SUBJECTIVE: I was doing well and I had more confidence with my legs after the evaluation but yesterday my legs felt wobbly and like "nothing" when I tried to get up from a couch.  It isnt the usual couch I get up from but I just lost a little confidence  PAIN:  Are you having pain? Yes NPRS scale: 1/10 at end of aquatic exercise 0/10 Pain location: generalized but bil hips, ribs are sore generally Pain orientation: Other: Generalized   PAIN TYPE: aching and hypersensitivity Pain description: intermittent, constant, and aching  constant at end of day Aggravating factors: movement. Walking going up and down steps, reaching for objects Relieving factors: nothing    OBJECTIVE:    DIAGNOSTIC FINDINGS: 07/02/21 IMPRESSION: Normal MRI of the  thoracic and lumbar spine. IMPRESSION: Normal MRI of the cervical spine. No evidence of demyelinating disease.   PATIENT SURVEYS:  FOTO 59% intake   SCREENING FOR RED FLAGS: Bowel or bladder incontinence: No Spinal tumors: No Cauda equina syndrome: No Compression fracture: No Abdominal aneurysm: No   COGNITION:          Overall cognitive status: Within functional limits for tasks assessed                        SENSATION:          Light touch: Deficits  hypersensitive to touch feels bruised with light touch          Stereognosis: Appears intact          Hot/Cold: Appears intact          Proprioception: Appears intact   MUSCLE LENGTH: Hamstrings: Right 70 deg; Left 70 deg     POSTURE:  Obese with increased abdominal girth   PALPATION: Pt feels overall general tenderness to light palpation some more intense over bil hips.    CERVICAL AROM/PROM   A/PROM A/PROM (deg) 08/15/2021  Flexion 54  Extension 30  Right lateral flexion 20  Left lateral flexion 21  Right rotation 40  Left rotation 45   (Blank rows = not tested)   UE AROM/PROM:   A/PROM Right 08/15/2021 Left 08/15/2021  Shoulder flexion 155 156  Shoulder extension 150 150  Shoulder abduction      Shoulder adduction      Shoulder extension      Shoulder internal rotation      Shoulder external rotation       (Blank rows = not tested)   UE MMT:   MMT Right 08/15/2021 Left 08/15/2021  Shoulder flexion 4/5 4/5  Shoulder extension 4+/5 4+/5  Shoulder abduction 4/5 4/5  Shoulder adduction      Grip strength       (Blank rows = not tested)   CERVICAL SPECIAL TESTS:  Upper limb tension test (ULTT): Negative and Spurling's test: Negative     LUMBARAROM/PROM   A/PROM A/PROM  08/15/2021  Flexion 50%  Extension 80%  Right lateral flexion 75%  Left lateral flexion 75%  Right rotation 75%  Left rotation 75%   (Blank rows = not tested)   LE AROM/PROM:   A/PROM Right 08/15/2021 Left 08/15/2021  Hip flexion 110 110  Hip extension      Hip abduction      Hip adduction      Hip internal rotation      Hip external rotation      Knee flexion 120 115  Knee extension 0 0  Ankle dorsiflexion      Ankle plantarflexion      Ankle inversion      Ankle eversion       (Blank rows = not tested)   LE MMT:   MMT Right 08/15/2021 Left 08/15/2021  Hip flexion 4-/5 4-/5  Hip extension 4-/5 4-/5  Hip abduction 3+/5 3+/5  Hip adduction      Hip internal rotation       Hip external rotation      Knee flexion 4/5 4/5  Knee extension 4/5 4/5  Ankle dorsiflexion      Ankle plantarflexion      Ankle inversion      Ankle eversion       (Blank rows = not tested)   LUMBAR SPECIAL TESTS:  Straight leg raise test: Negative and Slump test: Negative   FUNCTIONAL TESTS:  5 times sit to stand: 14.57 sec 6 minute walk test: 459 feet  161/95 bp  105 HR initial Eval needing one rest break at 5 min Berg Balance Scale: 52  Pt with sm base quad cane   GAIT: Distance walked: 459 for 6 min walk test, needs a sit break at 5 minutes Assistive device utilized: Quad cane small base Level of assistance: SBA Comments: Pt BERG and 5 x STS not indicative of balance issues. Pt with weakness and endurance issues   [facility] Adult PT Treatment:                                                DATE: 08-21-21 Aquatic Therapy Aquatic therapy at [address] - therapeutic pool temp 94 degrees Pt enters building with SBQC.  Treatment took place in water 3.8 to  4 ft 8 in.feet deep depending upon activity.  Pt entered and exited the pool via stair and handrails independently Ambulation across pool ( 15 ft) sided stepping and backward walking using aquatic barbells while ambulating to increase abdominal engagement with exercises. Runners stretch on R and L 30 sec x 2 and then stretch in to runners hamstring stretch 2 x 30 sec R and L Figure 4 squat stretch with UE support for R and L x 60 sec each  On edge of pool with bil UE support LE exercises Hip abd/add R/Leach and then using 1 UE support Hip ext/flex with knee straight  pt needing VC and TC for correct execution and sequencing Marching knee/hip 90/90  Marching with added pool noodle for increased resistance  Ham curl R/L   Squats  Supine abdominal hollowing with VC to keep hips up and feet up with  yellow barbells in UE submerged prone aquatic  plank with barbell submerged for abdominal engagement Combo prone to  supine with legs extended for 3 minutes to encourage active abdominal engagement.  Swimming with freestyle , side stroke and back stroke Jump tuck       [facility] Adult PT Treatment:                                                DATE: 08-15-21 Build endurance with walking   start at 5 min and add every 2-3 days. Education on building endurance.  RPE chart Exercises  Sit to Stand with Counter Support - 3 x daily - 7 x weekly - 1-2 sets - 10 reps Movement snacks throughout the day       PATIENT EDUCATION:  Education details report. Introduction to aquatic and therapeutic principles of water Person educated: Patient Education method: Explanation, Demonstration, Tactile cues, Verbal cues,  Education comprehension: verbalized understanding, returned demonstration, verbal cues required, and needs further education     HOME EXERCISE PROGRAM: Access Code: [place]: [email or web address] Date: 08/15/2021 Prepared by: Voncille Lo   ASSESSMENT:   CLINICAL IMPRESSION:  Ms Anastasio Auerbach entered water for aquatic therapy for first time and was introduced to principles and therapeutic effects of water as she ambulated and acclimated to pool.  Pt was able to ambulate in water without fear of falling.  Pt has  lost confidence in her ability to walk since her hospitalization in November 2022.  Pt has poor endurance and reports 1/10 pain which decreased to 0/10 in pool.  Pt was a former swim team member and she was able to acclimate to water quickly and participate in swimming as well as exercise to engage posterior chain exercise.  Pt will benefit from continued combination of land/aquatics due to bil LE weakness and ease of movement provided by aquatics Pt requires the buoyancy of water for active assisted exercises with buoyancy supported for strengthening and AROM exercises: PT  requires the viscosity of the water for resistance with strengthening exercises Hydrostatic pressure also  supports joints by unweighting joint load by at least 50 % in 3-4 feet depth water. 80% in chest to neck deep water.     REHAB POTENTIAL: Good   CLINICAL DECISION MAKING: Evolving/moderate complexity   EVALUATION COMPLEXITY: Moderate     GOALS: Goals reviewed with patient? Yes   SHORT TERM GOALS:   STG Name Target Date Goal status  1 Pt will be independent with initial HEP Baseline: no knowledge 09/05/2021 INITIAL  2 Pt will be able to walk 10 minutes without a break Baseline: 5 min and must sit due to 9/10 PRE 09/05/2021 INITIAL  3 FOTO will improve from 59%   to   64%  indicating improved functional mobility.  Baseline: eval 59% 09/05/2021 INITIAL  4 Pt will be able to perform 5 x STS in 13.0 sec or less to show improved LE strength Baseline: eval 14.57 sec 09/05/2021 INITIAL  5 Pt will be educated on falls prevention and stat 3 strategies  Baseline: 09/05/2021 INITIAL    LONG TERM GOALS:    LTG Name Target Date Goal status  1 Pt will be independent with advanced HEP Baseline:no  knowledge and poor endurance 10/10/2021 INITIAL  2 Pt will be able to drive to store and shop for 30 minutes without adverse effect Baseline: unable to drive presently due to endurance 10/10/2021 INITIAL  3 Pt will be able to negotiate 12 steps with good endurance and no pain Baseline: 10/10/2021 INITIAL  4 Pt will be able to stand for 30 minutes to complete household chores Baseline: 10/10/2021 INITIAL  5 FOTO will improve from 59%   to   64%  indicating improved functional mobility.    Baseline:eval 59% 10/10/2021 INITIAL  6 Pt will be able to increase gait velocity to at least 2.62 ft/sec in order to perform community ambulation Baseline: 1.36 ft/sec 10/10/2021 INITIAL    PLAN: PT FREQUENCY: 2x/week   PT DURATION: 8 weeks   PLANNED INTERVENTIONS: Therapeutic exercises, Therapeutic activity, Neuro Muscular re-education, Balance training, Gait training, Patient/Family education, Joint mobilization, Stair  training, Vestibular training, Aquatic Therapy, Dry Needling, Electrical stimulation, Spinal mobilization, Cryotherapy, Moist heat, Taping, Manual therapy, and endurance training for generalized weakness   PLAN FOR NEXT SESSION: Progress HEP for building strength and endurance on land and assess Aqautic benefit     Voncille Lo, PT, [place] Certified Exercise Expert for the Aging Adult  08/21/21 4:19 PM Phone: [phone] Fax: [place], [place] Certified Exercise Expert for the Aging Adult  03/18/22 11:17 AM Phone: [phone] Fax: [phone]

## 2021-08-21 ENCOUNTER — Encounter: Payer: Self-pay | Admitting: Physical Therapy

## 2021-08-21 ENCOUNTER — Other Ambulatory Visit: Payer: Self-pay

## 2021-08-21 ENCOUNTER — Ambulatory Visit: Payer: Self-pay | Admitting: Physical Therapy

## 2021-08-21 DIAGNOSIS — M6281 Muscle weakness (generalized): Secondary | ICD-10-CM

## 2021-08-21 DIAGNOSIS — R252 Cramp and spasm: Secondary | ICD-10-CM

## 2021-08-21 DIAGNOSIS — R2681 Unsteadiness on feet: Secondary | ICD-10-CM

## 2021-08-21 DIAGNOSIS — R296 Repeated falls: Secondary | ICD-10-CM

## 2021-08-26 NOTE — Therapy (Incomplete)
OUTPATIENT PHYSICAL THERAPY TREATMENT NOTE   Patient Name: [name] [name] MRN: [id number] DOB:June 27, 1986, 36 y.o., female Today's Date: 08/26/2021  PCP: Storm Frisk, MD REFERRING PROVIDER: Bary Richard     Past Medical History:  Diagnosis Date   Anxiety    Depression    Phreesia 06/30/2020   GERD (gastroesophageal reflux disease)    History of kidney stones    Pneumonitis 03/02/2019   Past Surgical History:  Procedure Laterality Date   CYSTOSCOPY WITH RETROGRADE PYELOGRAM, URETEROSCOPY AND STENT PLACEMENT Right 12/10/2016   Procedure: CYSTOSCOPY WITH RETROGRADE PYELOGRAM, URETEROSCOPY AND STONE REMOVAL;  Surgeon: Heloise Purpura, MD;  Location: [facility];  Service: Urology;  Laterality: Right;   WISDOM TOOTH EXTRACTION  at age 64   Patient Active Problem List   Diagnosis Date Noted   Lower extremity weakness 07/08/2021   Weakness 07/08/2021   Brain fog 06/12/2021   Sinusitis, acute 01/01/2021   Other atopic dermatitis 04/16/2020   Food allergy 01/12/2020   Class 3 severe obesity due to excess calories without serious comorbidity with body mass index (BMI) of 45.0 to 49.9 in adult [facility]) 09/06/2019   Eczema of external ear, 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   Asthma, moderate persistent 03/02/2019   Other allergic rhinitis 03/02/2019   Mold suspected exposure 03/02/2019    REFERRING DIAG:  R53.1 (ICD-10-CM) - Weakness  M62.838 (ICD-10-CM) - Muscle spasm    THERAPY DIAG:  No diagnosis found.  PERTINENT HISTORY: Pt presented to ED twice once on 11-29 -22 and 07-08-21 and  moderate persistent asthma, Bipolar I disorder, chronic allergic rhinitis,  PRECAUTIONS: Fall  SUBJECTIVE: I was doing well and I had more confidence with my legs after the evaluation but yesterday my legs felt wobbly and like "nothing" when I tried to get up from a couch.  It isnt the usual couch I get up from but I just lost a little confidence  PAIN:  Are you having pain? Yes NPRS scale:  1/10 at end of aquatic exercise 0/10 Pain location: generalized but bil hips, ribs are sore generally Pain orientation: Other: Generalized   PAIN TYPE: aching and hypersensitivity Pain description: intermittent, constant, and aching  constant at end of day Aggravating factors: movement. Walking going up and down steps, reaching for objects Relieving factors: nothing    OBJECTIVE:    DIAGNOSTIC FINDINGS: 07/02/21 IMPRESSION: Normal MRI of the thoracic and lumbar spine. IMPRESSION: Normal MRI of the cervical spine. No evidence of demyelinating disease.   PATIENT SURVEYS:  FOTO 59% intake   SCREENING FOR RED FLAGS: Bowel or bladder incontinence: No Spinal tumors: No Cauda equina syndrome: No Compression fracture: No Abdominal aneurysm: No   COGNITION:          Overall cognitive status: Within functional limits for tasks assessed                        SENSATION:          Light touch: Deficits hypersensitive to touch feels bruised with light touch          Stereognosis: Appears intact          Hot/Cold: Appears intact          Proprioception: Appears intact   MUSCLE LENGTH: Hamstrings: Right 70 deg; Left 70 deg     POSTURE:  Obese with increased abdominal girth   PALPATION: Pt feels overall general tenderness to light palpation some more intense over bil hips.  CERVICAL AROM/PROM   A/PROM A/PROM (deg) 08/15/2021  Flexion 54  Extension 30  Right lateral flexion 20  Left lateral flexion 21  Right rotation 40  Left rotation 45   (Blank rows = not tested)   UE AROM/PROM:   A/PROM Right 08/15/2021 Left 08/15/2021  Shoulder flexion 155 156  Shoulder extension 150 150  Shoulder abduction      Shoulder adduction      Shoulder extension      Shoulder internal rotation      Shoulder external rotation       (Blank rows = not tested)   UE MMT:   MMT Right 08/15/2021 Left 08/15/2021  Shoulder flexion 4/5 4/5  Shoulder extension 4+/5 4+/5  Shoulder  abduction 4/5 4/5  Shoulder adduction      Grip strength       (Blank rows = not tested)   CERVICAL SPECIAL TESTS:  Upper limb tension test (ULTT): Negative and Spurling's test: Negative     LUMBARAROM/PROM   A/PROM A/PROM  08/15/2021  Flexion 50%  Extension 80%  Right lateral flexion 75%  Left lateral flexion 75%  Right rotation 75%  Left rotation 75%   (Blank rows = not tested)   LE AROM/PROM:   A/PROM Right 08/15/2021 Left 08/15/2021  Hip flexion 110 110  Hip extension      Hip abduction      Hip adduction      Hip internal rotation      Hip external rotation      Knee flexion 120 115  Knee extension 0 0  Ankle dorsiflexion      Ankle plantarflexion      Ankle inversion      Ankle eversion       (Blank rows = not tested)   LE MMT:   MMT Right 08/15/2021 Left 08/15/2021  Hip flexion 4-/5 4-/5  Hip extension 4-/5 4-/5  Hip abduction 3+/5 3+/5  Hip adduction      Hip internal rotation      Hip external rotation      Knee flexion 4/5 4/5  Knee extension 4/5 4/5  Ankle dorsiflexion      Ankle plantarflexion      Ankle inversion      Ankle eversion       (Blank rows = not tested)   LUMBAR SPECIAL TESTS:  Straight leg raise test: Negative and Slump test: Negative   FUNCTIONAL TESTS:  5 times sit to stand: 14.57 sec 6 minute walk test: 459 feet  161/95 bp  105 HR initial Eval needing one rest break at 5 min Berg Balance Scale: 52  Pt with sm base quad cane   GAIT: Distance walked: 459 for 6 min walk test, needs a sit break at 5 minutes Assistive device utilized: Quad cane small base Level of assistance: SBA Comments: Pt BERG and 5 x STS not indicative of balance issues. Pt with weakness and endurance issues   [facility] Adult PT Treatment:                                                DATE: 08-21-21 Aquatic Therapy Aquatic therapy at [facility]- [address] - therapeutic pool temp 94 degrees Pt enters building with SBQC.  Treatment took place in  water 3.8 to  4 ft 8 in.feet deep depending upon  activity.  Pt entered and exited the pool via stair and handrails independently Ambulation across pool ( 15 ft) sided stepping and backward walking using aquatic barbells while ambulating to increase abdominal engagement with exercises. Runners stretch on R and L 30 sec x 2 and then stretch in to runners hamstring stretch 2 x 30 sec R and L Figure 4 squat stretch with UE support for R and L x 60 sec each  On edge of pool with bil UE support LE exercises Hip abd/add R/Leach and then using 1 UE support Hip ext/flex with knee straight  pt needing VC and TC for correct execution and sequencing Marching knee/hip 90/90  Marching with added pool noodle for increased resistance  Ham curl R/L   Squats  Supine abdominal hollowing with VC to keep hips up and feet up with  yellow barbells in UE submerged prone aquatic  plank with barbell submerged for abdominal engagement Combo prone to supine with legs extended for 3 minutes to encourage active abdominal engagement.  Swimming with freestyle , side stroke and back stroke Jump tuck       [facility] Adult PT Treatment:                                                DATE: 08-15-21 Build endurance with walking   start at 5 min and add every 2-3 days. Education on building endurance.  RPE chart Exercises  Sit to Stand with Counter Support - 3 x daily - 7 x weekly - 1-2 sets - 10 reps Movement snacks throughout the day       PATIENT EDUCATION:  Education details report. Introduction to aquatic and therapeutic principles of water Person educated: Patient Education method: Explanation, Demonstration, Tactile cues, Verbal cues,  Education comprehension: verbalized understanding, returned demonstration, verbal cues required, and needs further education     HOME EXERCISE PROGRAM: Access Code: [email or web address] URL: [email or web address] Date: 08/15/2021 Prepared by: Garen LahLawrie Jacquelynne Guedes   ASSESSMENT:    CLINICAL IMPRESSION:  [name] [name] entered water for aquatic therapy for first time and was introduced to principles and therapeutic effects of water as she ambulated and acclimated to pool.  Pt was able to ambulate in water without fear of falling.  Pt has lost confidence in her ability to walk since her hospitalization in November 2022.  Pt has poor endurance and reports 1/10 pain which decreased to 0/10 in pool.  Pt was a former swim team member and she was able to acclimate to water quickly and participate in swimming as well as exercise to engage posterior chain exercise.  Pt will benefit from continued combination of land/aquatics due to bil LE weakness and ease of movement provided by aquatics Pt requires the buoyancy of water for active assisted exercises with buoyancy supported for strengthening and AROM exercises: PT  requires the viscosity of the water for resistance with strengthening exercises Hydrostatic pressure also supports joints by unweighting joint load by at least 50 % in 3-4 feet depth water. 80% in chest to neck deep water.     REHAB POTENTIAL: Good   CLINICAL DECISION MAKING: Evolving/moderate complexity   EVALUATION COMPLEXITY: Moderate     GOALS: Goals reviewed with patient? Yes   SHORT TERM GOALS:   STG Name Target Date Goal status  1 Pt will be independent with initial HEP Baseline: no knowledge 09/05/2021  INITIAL  2 Pt will be able to walk 10 minutes without a break Baseline: 5 min and must sit due to 9/10 PRE 09/05/2021 INITIAL  3 FOTO will improve from 59%   to   64%  indicating improved functional mobility.  Baseline: eval 59% 09/05/2021 INITIAL  4 Pt will be able to perform 5 x STS in 13.0 sec or less to show improved LE strength Baseline: eval 14.57 sec 09/05/2021 INITIAL  5 Pt will be educated on falls prevention and stat 3 strategies  Baseline: 09/05/2021 INITIAL    LONG TERM GOALS:    LTG Name Target Date Goal status  1 Pt will be independent with  advanced HEP Baseline:no  knowledge and poor endurance 10/10/2021 INITIAL  2 Pt will be able to drive to store and shop for 30 minutes without adverse effect Baseline: unable to drive presently due to endurance 10/10/2021 INITIAL  3 Pt will be able to negotiate 12 steps with good endurance and no pain Baseline: 10/10/2021 INITIAL  4 Pt will be able to stand for 30 minutes to complete household chores Baseline: 10/10/2021 INITIAL  5 FOTO will improve from 59%   to   64%  indicating improved functional mobility.    Baseline:eval 59% 10/10/2021 INITIAL  6 Pt will be able to increase gait velocity to at least 2.62 ft/sec in order to perform community ambulation Baseline: 1.36 ft/sec 10/10/2021 INITIAL    PLAN: PT FREQUENCY: 2x/week   PT DURATION: 8 weeks   PLANNED INTERVENTIONS: Therapeutic exercises, Therapeutic activity, Neuro Muscular re-education, Balance training, Gait training, Patient/Family education, Joint mobilization, Stair training, Vestibular training, Aquatic Therapy, Dry Needling, Electrical stimulation, Spinal mobilization, Cryotherapy, Moist heat, Taping, Manual therapy, and endurance training for generalized weakness   PLAN FOR NEXT SESSION: Progress HEP for building strength and endurance on land and assess Aqautic benefit     Garen LahLawrie Teo Moede, PT, [place] Certified Exercise Expert for the Aging Adult  08/26/21 8:54 PM Phone: [phone] Fax: [phone]

## 2021-08-27 ENCOUNTER — Ambulatory Visit (HOSPITAL_COMMUNITY): Payer: No Typology Code available for payment source

## 2021-08-27 ENCOUNTER — Ambulatory Visit: Payer: Self-pay | Admitting: Physical Therapy

## 2021-08-28 ENCOUNTER — Telehealth (HOSPITAL_COMMUNITY): Payer: Self-pay | Admitting: Internal Medicine

## 2021-08-28 NOTE — Telephone Encounter (Signed)
Patient called and cancelled echocardiogram and does not wish to reschedule at this time. Order will be removed fron the echo WQ. If patient calls back at a later time to reschedule we will reinstate the order. Thank you.

## 2021-08-31 ENCOUNTER — Ambulatory Visit: Payer: Self-pay

## 2021-09-10 ENCOUNTER — Other Ambulatory Visit (HOSPITAL_COMMUNITY): Payer: No Typology Code available for payment source

## 2021-09-11 ENCOUNTER — Ambulatory Visit: Payer: No Typology Code available for payment source | Admitting: Physical Therapy

## 2021-09-12 ENCOUNTER — Other Ambulatory Visit: Payer: Self-pay

## 2021-09-18 ENCOUNTER — Ambulatory Visit: Payer: Self-pay | Admitting: Neurology

## 2021-09-18 ENCOUNTER — Ambulatory Visit: Payer: No Typology Code available for payment source | Admitting: Physical Therapy

## 2021-09-23 NOTE — Progress Notes (Deleted)
Established Patient Office Visit  Subjective:  Patient ID: [name] [name], female    DOB: 11-12-1985  Age: 36 y.o. MRN: [id number]  CC: No chief complaint on file.   HPI [name] A Crume presents for  Pap  07/08/21 [name] A Shakespeare presents for post emergency room follow-up.  Patient was in the emergency room between the 28th and 29 November with lower extremity weakness.  Below is documentation from that emergency room visit   36 yo female presets with lower extremity weakness.  Patient has had URI symptoms and was treated for sinusitis with Augmentin.  [name] [name] felt better from the ninth of the 19th when she was on the Augmentin.  She was off of it over the weekend and had some fevers.  She has now noted difficulty walking.  She feels that she is weak and she has to shuffle.  She has difficulty standing up from a seated position.  She has also had cough with this.  She has a history of asthma.  She has had some difficulty breathing and has had to have her asthma medications adjusted.  She does not feel that she is having trouble with her feet or ankles.  She denies any numbness.  Patient was initially seen and evaluated at urgent care which had a chest x-ray done, with minimal basilar atelectasis, normal sed rate, normal CBC and negative POC flu and mono testing.  She is then seen here and screened.  MR of the head and neck were ordered.  Neurology was consulted via phone.  She has not had her MRI and is to be reevaluated. 36 year old female with recent febrile illness, also recently on antibiotics who presents today with proximal leg weakness difficulty walking.   Patient with lower extremity weakness.  Concern for [name] discussed with Dr. Cheral Marker.  Plan admission to medicine for observation and mr of thoracic and lumbar spine. Patient now wishes to leave.  I have discussed risks with patient including respiratory distress and arrest.  We have discussed return precautions.  We  discussed that she can return at any time if she is feeling worse.  They do live within the city and her husband will be with her. Pt left AMA  needs Thoracic /lumbar MRI   The patient did leave [place] was going to be admitted for observation and have additional testing for potential Guillain-Barr.  The patient is self-pay.  The patient states her symptoms have progressed.  She now has to use a Rollator to walk.  She has extreme weakness in her lower extremities and also now in her upper arm.  She has sensory changes in her feet as well.  She has severe muscle aches in the lower extremity.  He has difficulty sleeping and low-grade temperatures to 100 degrees.  She has loose stools.  Patient also has difficulty taking a deep breath.  She states there is a rash over the back of her neck.  She did take the 50 mg prednisone given to her in the emergency room and finished this without any change in symptoms.   09/24/21 Was sent to ED 07/2021 from office ]Date of Admission: 07/08/2021                      Date of Discharge: 07/09/2021 Admitting Physician: [name], MD   Primary Care Provider: [name], MD Consultants: Neurology   Indication for Hospitalization: Weakness   Discharge Diagnoses/Problem List:  Principal  Problem: °  Weakness °  °Disposition: Home with HH PT °  °Discharge Condition: Stable °  °Discharge Exam: From same day progress note: °General: well appearing, NAD  °Cardiovascular: regular rate and rhythm, no murmur appreciated.  °Respiratory: clear, no wheezing or crackles  °Abdomen: soft, non-tender  °Extremities: no peripheral edema  °Neuro: Strength 5/5 bilaterally in UE and LE. No focal deficits, no facial drooping, slurred speech, changes in vision.  °  °Brief Hospital Course:  °[name] [name] is a 35 y.o. female who presented with 10 days of weakness and myalgias. PMH is significant for moderate persistent asthma, Bipolar I disorder, chronic allergic  rhinitis, Class 3 obesity. Her hospital course is outlined below:  °  °Weakness   Muscular Tenderness   Body Aches °Patient presented with 10 days of weakness, body aches, and diffuse muscular tenderness. Initial workup with MRI of brain and spine were unremarkable with no evidence of demyelinating disease. Patient had intact reflexes and isolated muscle groups appeared to have intact strength. Presentation was less concerning for [name] Syndrome. Initial labs showed CK 33, CRP 2.0, sed rate 8, cortisol am 9.8, and no electrolyte abnormalities. Neurology began workup for rheumatologic disease, with several labs pending at the time of discharge (see below). If rheumatologic workup negative, differential includes inflammatory myositis vs fibromyalgia vs functional disorder. Neuro recommended outpatient follow up with Dr. Donika Patel in 2-4 weeks. Patient was evaluated by physical therapy and occupational therapy who recommended outpatient PT.  °  °Items for PCP Follow-up: °Follow up with psychiatry for bipolar disorder to optimize treatment.  °Follow up on autoimmune lab results.  °Outpatient PT follow up.  °Follow up with Neurology, Dr. Patel  °  °Saw Cards for POTS  Ziopatch doubts POTS ° °MClung 07/24/21 °1. Weakness °Much improved.  Keep f/up with Dr Patel °- Comprehensive metabolic panel °- Ambulatory referral to Physical Therapy °  °2. Elevated C-reactive protein (CRP) °Drink 80-100 ounces water daily °- Comprehensive metabolic panel °- C-reactive protein °  °3. Leukocytosis, unspecified type °- CBC with Differential/Platelet °  °4. Eczema, unspecified type °- triamcinolone cream (KENALOG) 0.1 %; Apply 1 application topically 2 (two) times daily.  Dispense: 45 g; Refill: 1 °  °5. Muscle spasm °- Comprehensive metabolic panel °- Ambulatory referral to Physical Therapy °- methocarbamol (ROBAXIN) 500 MG tablet; Take 1 tablet (500 mg total) by mouth every 6 (six) hours as needed for muscle spasms.   Dispense: 90 tablet; Refill: 1 °  °6. Hyperglycemia °I have had a lengthy discussion and provided education about insulin resistance and the intake of too much sugar/refined carbohydrates.  I have advised the patient to work at a goal of eliminating sugary drinks, candy, desserts, sweets, refined sugars, processed foods, and white carbohydrates.  The patient expresses understanding.  °- Hemoglobin A1c; Future °  °7. Poor dentition °- Ambulatory referral to Dentistry °  °8. Hospital discharge follow-up °Overall improving ° °??NEURO PATEL?? ° °Past Medical History:  °Diagnosis Date  °• Anxiety   °• Depression   ° [employer] 06/30/2020  °• GERD (gastroesophageal reflux disease)   °• History of kidney stones   °• Pneumonitis 03/02/2019  ° ° °Past Surgical History:  °Procedure Laterality Date  °• CYSTOSCOPY WITH RETROGRADE PYELOGRAM, URETEROSCOPY AND STENT PLACEMENT Right 12/10/2016  ° Procedure: CYSTOSCOPY WITH RETROGRADE PYELOGRAM, URETEROSCOPY AND STONE REMOVAL;  Surgeon: Borden, Lester, MD;  Location: [facility];  Service: Urology;  Laterality: Right;  °• WISDOM TOOTH EXTRACTION  at age 14  ° ° °  Family History  °Problem Relation Age of Onset  °• Depression Mother   °• Anxiety disorder Mother   ° ° °Social History  ° °Socioeconomic History  °• Marital status: Significant Other  °  Spouse name: Not on file  °• Number of children: Not on file  °• Years of education: Not on file  °• Highest education level: Not on file  °Occupational History  °• Not on file  °Tobacco Use  °• Smoking status: Never  °• Smokeless tobacco: Never  °Vaping Use  °• Vaping Use: Never used  °Substance and Sexual Activity  °• Alcohol use: Yes  °  Alcohol/week: 1.0 standard drink  °  Types: 1 Glasses of wine per week  °  Comment: per week  °• Drug use: No  °• Sexual activity: Yes  °  Birth control/protection: I.U.D.  °Other Topics Concern  °• Not on file  °Social History Narrative  °• Not on file  ° °Social Determinants of Health  ° °Financial Resource  Strain: Not on file  °Food Insecurity: Not on file  °Transportation Needs: Not on file  °Physical Activity: Not on file  °Stress: Not on file  °Social Connections: Not on file  °Intimate Partner Violence: Not on file  ° ° °Outpatient Medications Prior to Visit  °Medication Sig Dispense Refill  °• acetaminophen (TYLENOL) 500 MG tablet Take 1,000 mg by mouth every 6 (six) hours as needed for moderate pain.    °• albuterol (VENTOLIN HFA) 108 (90 Base) MCG/ACT inhaler Inhale 2 puffs into the lungs every 6 (six) hours as needed for wheezing or shortness of breath. 18 g 1  °• cetirizine (ZYRTEC) 10 MG tablet Take 1 tablet (10 mg total) by mouth daily. 60 tablet 6  °• Cholecalciferol (VITAMIN D3) 50 MCG (2000 UT) TABS Take 1 oral tablet once a day 30 tablet 2  °• escitalopram (LEXAPRO) 20 MG tablet Take 1 tablet (20 mg total) by mouth at bedtime. 180 tablet 0  °• escitalopram (LEXAPRO) 20 MG tablet Take 1 tab by mouth every morning 90 tablet 1  °• fluticasone furoate-vilanterol (BREO ELLIPTA) 100-25 MCG/ACT AEPB Inhale 1 puff into the lungs daily. 60 each 11  °• guaiFENesin (MUCINEX PO) Take 1 capsule by mouth daily as needed (congestion).    °• ibuprofen (ADVIL) 200 MG tablet Take 600 mg by mouth every 6 (six) hours as needed for moderate pain.    °• lamoTRIgine (LAMICTAL) 200 MG tablet Take 1 tablet (200 mg total) by mouth 2 (two) times daily. 360 tablet 0  °• lamoTRIgine (LAMICTAL) 200 MG tablet Take 1 tablet by mouth at bedtime (if out of lamictal for more than 1 week do not restart, call MD) 90 tablet 0  °• lansoprazole (PREVACID) 30 MG capsule Take 1 capsule (30 mg total) by mouth daily at 12 noon. 180 capsule 0  °• lithium carbonate (LITHOBID) 300 MG CR tablet Take 1 tablet by mouth at bedtime 90 tablet 0  °• methocarbamol (ROBAXIN) 500 MG tablet Take 1 tablet (500 mg total) by mouth every 6 (six) hours as needed for muscle spasms. 90 tablet 1  °• montelukast (SINGULAIR) 10 MG tablet Take 1 tablet (10 mg total) by  mouth at bedtime. 180 tablet 0  °• pramipexole (MIRAPEX) 1.5 MG tablet Take 1 tablet (1.5 mg total) by mouth at bedtime. 180 tablet 0  °• pramipexole (MIRAPEX) 1.5 MG tablet take 1 tab by mouth at bedtime 90 tablet 0  °• sulindac (CLINORIL) 150 MG   tablet take 1-2 tablets daily as needed for pain 60 tablet 2  °• thiamine 100 MG tablet Take 100 mg by mouth daily.    ° °No facility-administered medications prior to visit.  ° ° °Allergies  °Allergen Reactions  °• Other   °  Bananas, kiwi  °• Wellbutrin [Bupropion] Hives  ° ° °ROS °Review of Systems ° °  °Objective:  °  °Physical Exam ° °There were no vitals taken for this visit. °Wt Readings from Last 3 Encounters:  °08/07/21 (!) 310 lb (140.6 kg)  °07/09/21 280 lb (127 kg)  °05/23/21 289 lb 8 oz (131.3 kg)  ° ° ° °Health Maintenance Due  °Topic Date Due  °• COVID-19 Vaccine (4 - Booster for Pfizer series) 08/15/2020  °• PAP SMEAR-Modifier  08/22/2020  ° ° °There are no preventive care reminders to display for this patient. ° °Lab Results  °Component Value Date  ° TSH 0.932 07/01/2021  ° °Lab Results  °Component Value Date  ° WBC 8.2 07/24/2021  ° HGB 12.5 07/24/2021  ° HCT 36.9 07/24/2021  ° MCV 87 07/24/2021  ° PLT 311 07/24/2021  ° °Lab Results  °Component Value Date  ° NA 139 07/24/2021  ° K 4.5 07/24/2021  ° CO2 25 07/24/2021  ° GLUCOSE 82 07/24/2021  ° BUN 12 07/24/2021  ° CREATININE 0.72 07/24/2021  ° BILITOT 0.3 07/24/2021  ° ALKPHOS 106 07/24/2021  ° AST 26 07/24/2021  ° ALT 24 07/24/2021  ° PROT 5.8 (L) 07/24/2021  ° ALBUMIN 4.2 07/24/2021  ° CALCIUM 9.3 07/24/2021  ° ANIONGAP 8 07/08/2021  ° EGFR 112 07/24/2021  ° °Lab Results  °Component Value Date  ° CHOL 175 01/24/2017  ° °Lab Results  °Component Value Date  ° HDL 43 01/24/2017  ° °Lab Results  °Component Value Date  ° LDLCALC 98 01/24/2017  ° °Lab Results  °Component Value Date  ° TRIG 171 (H) 01/24/2017  ° °Lab Results  °Component Value Date  ° CHOLHDL 4.1 01/24/2017  ° °Lab Results  °Component Value  Date  ° HGBA1C 5.0 01/24/2017  ° ° °  °Assessment & Plan:  ° °Problem List Items Addressed This Visit   °None ° ° °No orders of the defined types were placed in this encounter. ° ° °Follow-up: No follow-ups on file.  ° ° °Riven Mabile, MD °

## 2021-09-24 ENCOUNTER — Ambulatory Visit: Payer: No Typology Code available for payment source | Admitting: Critical Care Medicine

## 2021-09-25 ENCOUNTER — Ambulatory Visit: Payer: No Typology Code available for payment source | Admitting: Critical Care Medicine

## 2021-09-25 ENCOUNTER — Ambulatory Visit: Payer: No Typology Code available for payment source | Admitting: Physical Therapy

## 2021-09-25 NOTE — Progress Notes (Incomplete)
Established Patient Office Visit  Subjective:  Patient ID: [name] [name], female    DOB: 11-12-1985  Age: 36 y.o. MRN: [id number]  CC: No chief complaint on file.   HPI [name] [name] presents for  Pap  07/08/21 [name] [name] presents for post emergency room follow-up.  Patient was in the emergency room between the 28th and 29 November with lower extremity weakness.  Below is documentation from that emergency room visit   36 yo female presets with lower extremity weakness.  Patient has had URI symptoms and was treated for sinusitis with Augmentin.  [name] [name] felt better from the ninth of the 19th when she was on the Augmentin.  She was off of it over the weekend and had some fevers.  She has now noted difficulty walking.  She feels that she is weak and she has to shuffle.  She has difficulty standing up from a seated position.  She has also had cough with this.  She has a history of asthma.  She has had some difficulty breathing and has had to have her asthma medications adjusted.  She does not feel that she is having trouble with her feet or ankles.  She denies any numbness.  Patient was initially seen and evaluated at urgent care which had a chest x-ray done, with minimal basilar atelectasis, normal sed rate, normal CBC and negative POC flu and mono testing.  She is then seen here and screened.  MR of the head and neck were ordered.  Neurology was consulted via phone.  She has not had her MRI and is to be reevaluated. 36 year old female with recent febrile illness, also recently on antibiotics who presents today with proximal leg weakness difficulty walking.   Patient with lower extremity weakness.  Concern for [name] [name] discussed with Dr. Cheral Marker.  Plan admission to medicine for observation and mr of thoracic and lumbar spine. Patient now wishes to leave.  I have discussed risks with patient including respiratory distress and arrest.  We have discussed return precautions.  We  discussed that she can return at any time if she is feeling worse.  They do live within the city and her husband will be with her. Pt left AMA  needs Thoracic /lumbar MRI   The patient did leave [place] was going to be admitted for observation and have additional testing for potential Guillain-Barr.  The patient is self-pay.  The patient states her symptoms have progressed.  She now has to use a Rollator to walk.  She has extreme weakness in her lower extremities and also now in her upper arm.  She has sensory changes in her feet as well.  She has severe muscle aches in the lower extremity.  He has difficulty sleeping and low-grade temperatures to 100 degrees.  She has loose stools.  Patient also has difficulty taking a deep breath.  She states there is a rash over the [name] of her neck.  She did take the 50 mg prednisone given to her in the emergency room and finished this without any change in symptoms.   09/24/21 Was sent to ED 07/2021 from office ]Date of Admission: 07/08/2021                      Date of Discharge: 07/09/2021 Admitting Physician: [name], MD   Primary Care Provider: [name], MD Consultants: Neurology   Indication for Hospitalization: Weakness   Discharge Diagnoses/Problem List:  Principal  Problem:   Weakness   Disposition: Home with HH PT   Discharge Condition: Stable   Discharge Exam: From same day progress note: General: well appearing, NAD  Cardiovascular: regular rate and rhythm, no murmur appreciated.  Respiratory: clear, no wheezing or crackles  Abdomen: soft, non-tender  Extremities: no peripheral edema  Neuro: Strength 5/5 bilaterally in UE and LE. No focal deficits, no facial drooping, slurred speech, changes in vision.    Brief Hospital Course:  [name] [name] is a 36 y.o. female who presented with 10 days of weakness and myalgias. PMH is significant for moderate persistent asthma, Bipolar I disorder, chronic allergic  rhinitis, Class 3 obesity. Her hospital course is outlined below:    Weakness   Muscular Tenderness   Body Aches Patient presented with 10 days of weakness, body aches, and diffuse muscular tenderness. Initial workup with MRI of brain and spine were unremarkable with no evidence of demyelinating disease. Patient had intact reflexes and isolated muscle groups appeared to have intact strength. Presentation was less concerning for [name] Syndrome. Initial labs showed CK 33, CRP 2.0, sed rate 8, cortisol am 9.8, and no electrolyte abnormalities. Neurology began workup for rheumatologic disease, with several labs pending at the time of discharge (see below). If rheumatologic workup negative, differential includes inflammatory myositis vs fibromyalgia vs functional disorder. Neuro recommended outpatient follow up with Dr. [name] in 2-4 weeks. Patient was evaluated by physical therapy and occupational therapy who recommended outpatient PT.    Items for PCP Follow-up: Follow up with psychiatry for bipolar disorder to optimize treatment.  Follow up on autoimmune lab results.  Outpatient PT follow up.  Follow up with Neurology, Dr. Ardelia Mems Cards for POTS  Ziopatch doubts POTS  MClung 07/24/21 1. Weakness Much improved.  Keep f/up with Dr [name] - Comprehensive metabolic panel - Ambulatory referral to Physical Therapy   2. Elevated C-reactive protein (CRP) Drink 80-100 ounces water daily - Comprehensive metabolic panel - C-reactive protein   3. Leukocytosis, unspecified type - CBC with Differential/Platelet   4. Eczema, unspecified type - triamcinolone cream (KENALOG) 0.1 %; Apply 1 application topically 2 (two) times daily.  Dispense: 45 g; Refill: 1   5. Muscle spasm - Comprehensive metabolic panel - Ambulatory referral to Physical Therapy - methocarbamol (ROBAXIN) 500 MG tablet; Take 1 tablet (500 mg total) by mouth every 6 (six) hours as needed for muscle spasms.   Dispense: 90 tablet; Refill: 1   6. Hyperglycemia I have had a lengthy discussion and provided education about insulin resistance and the intake of too much sugar/refined carbohydrates.  I have advised the patient to work at a goal of eliminating sugary drinks, candy, desserts, sweets, refined sugars, processed foods, and white carbohydrates.  The patient expresses understanding.  - Hemoglobin A1c; Future   7. Poor dentition - Ambulatory referral to Dentistry   8. Hospital discharge follow-up Overall improving  ??NEURO PATEL??  Past Medical History:  Diagnosis Date   Anxiety    Depression    [employer] 06/30/2020   GERD (gastroesophageal reflux disease)    History of kidney stones    Pneumonitis 03/02/2019    Past Surgical History:  Procedure Laterality Date   CYSTOSCOPY WITH RETROGRADE PYELOGRAM, URETEROSCOPY AND STENT PLACEMENT Right 12/10/2016   Procedure: CYSTOSCOPY WITH RETROGRADE PYELOGRAM, URETEROSCOPY AND STONE REMOVAL;  Surgeon: Raynelle Bring, MD;  Location: [facility];  Service: Urology;  Laterality: Right;   WISDOM TOOTH EXTRACTION  at age 36  Family History  Problem Relation Age of Onset   Depression Mother    Anxiety disorder Mother     Social History   Socioeconomic History   Marital status: Significant Other    Spouse name: Not on file   Number of children: Not on file   Years of education: Not on file   Highest education level: Not on file  Occupational History   Not on file  Tobacco Use   Smoking status: Never   Smokeless tobacco: Never  Vaping Use   Vaping Use: Never used  Substance and Sexual Activity   Alcohol use: Yes    Alcohol/week: 1.0 standard drink    Types: 1 Glasses of wine per week    Comment: per week   Drug use: No   Sexual activity: Yes    Birth control/protection: I.U.D.  Other Topics Concern   Not on file  Social History Narrative   Not on file   Social Determinants of Health   Financial Resource  Strain: Not on file  Food Insecurity: Not on file  Transportation Needs: Not on file  Physical Activity: Not on file  Stress: Not on file  Social Connections: Not on file  Intimate Partner Violence: Not on file    Outpatient Medications Prior to Visit  Medication Sig Dispense Refill   acetaminophen (TYLENOL) 500 MG tablet Take 1,000 mg by mouth every 6 (six) hours as needed for moderate pain.     albuterol (VENTOLIN HFA) 108 (90 Base) MCG/ACT inhaler Inhale 2 puffs into the lungs every 6 (six) hours as needed for wheezing or shortness of breath. 18 g 1   cetirizine (ZYRTEC) 10 MG tablet Take 1 tablet (10 mg total) by mouth daily. 60 tablet 6   Cholecalciferol (VITAMIN D3) 50 MCG (2000 UT) TABS Take 1 oral tablet once a day 30 tablet 2   escitalopram (LEXAPRO) 20 MG tablet Take 1 tablet (20 mg total) by mouth at bedtime. 180 tablet 0   escitalopram (LEXAPRO) 20 MG tablet Take 1 tab by mouth every morning 90 tablet 1   fluticasone furoate-vilanterol (BREO ELLIPTA) 100-25 MCG/ACT AEPB Inhale 1 puff into the lungs daily. 60 each 11   guaiFENesin (MUCINEX PO) Take 1 capsule by mouth daily as needed (congestion).     ibuprofen (ADVIL) 200 MG tablet Take 600 mg by mouth every 6 (six) hours as needed for moderate pain.     lamoTRIgine (LAMICTAL) 200 MG tablet Take 1 tablet (200 mg total) by mouth 2 (two) times daily. 360 tablet 0   lamoTRIgine (LAMICTAL) 200 MG tablet Take 1 tablet by mouth at bedtime (if out of lamictal for more than 1 week do not restart, call MD) 90 tablet 0   lansoprazole (PREVACID) 30 MG capsule Take 1 capsule (30 mg total) by mouth daily at 12 noon. 180 capsule 0   lithium carbonate (LITHOBID) 300 MG CR tablet Take 1 tablet by mouth at bedtime 90 tablet 0   methocarbamol (ROBAXIN) 500 MG tablet Take 1 tablet (500 mg total) by mouth every 6 (six) hours as needed for muscle spasms. 90 tablet 1   montelukast (SINGULAIR) 10 MG tablet Take 1 tablet (10 mg total) by  mouth at bedtime. 180 tablet 0   pramipexole (MIRAPEX) 1.5 MG tablet Take 1 tablet (1.5 mg total) by mouth at bedtime. 180 tablet 0   pramipexole (MIRAPEX) 1.5 MG tablet take 1 tab by mouth at bedtime 90 tablet 0   sulindac (CLINORIL) 150 MG  tablet take 1-2 tablets daily as needed for pain 60 tablet 2   thiamine 100 MG tablet Take 100 mg by mouth daily.     No facility-administered medications prior to visit.    Allergies  Allergen Reactions   Other     Bananas, kiwi   Wellbutrin [Bupropion] Hives    ROS Review of Systems    Objective:    Physical Exam  There were no vitals taken for this visit. Wt Readings from Last 3 Encounters:  08/07/21 (!) 310 lb (140.6 kg)  07/09/21 280 lb (127 kg)  05/23/21 289 lb 8 oz (131.3 kg)     Health Maintenance Due  Topic Date Due   COVID-19 Vaccine (4 - Booster for Pfizer series) 08/15/2020   PAP SMEAR-Modifier  08/22/2020    There are no preventive care reminders to display for this patient.  Lab Results  Component Value Date   TSH 0.932 07/01/2021   Lab Results  Component Value Date   WBC 8.2 07/24/2021   HGB 12.5 07/24/2021   HCT 36.9 07/24/2021   MCV 87 07/24/2021   PLT 311 07/24/2021   Lab Results  Component Value Date   NA 139 07/24/2021   K 4.5 07/24/2021   CO2 25 07/24/2021   GLUCOSE 82 07/24/2021   BUN 12 07/24/2021   CREATININE 0.72 07/24/2021   BILITOT 0.3 07/24/2021   ALKPHOS 106 07/24/2021   AST 26 07/24/2021   ALT 24 07/24/2021   PROT 5.8 (L) 07/24/2021   ALBUMIN 4.2 07/24/2021   CALCIUM 9.3 07/24/2021   ANIONGAP 8 07/08/2021   EGFR 112 07/24/2021   Lab Results  Component Value Date   CHOL 175 01/24/2017   Lab Results  Component Value Date   HDL 43 01/24/2017   Lab Results  Component Value Date   LDLCALC 98 01/24/2017   Lab Results  Component Value Date   TRIG 171 (H) 01/24/2017   Lab Results  Component Value Date   CHOLHDL 4.1 01/24/2017   Lab Results  Component Value  Date   HGBA1C 5.0 01/24/2017      Assessment & Plan:   Problem List Items Addressed This Visit   None   No orders of the defined types were placed in this encounter.   Follow-up: No follow-ups on file.    Asencion Noble, MD

## 2021-10-01 ENCOUNTER — Other Ambulatory Visit: Payer: Self-pay

## 2021-10-01 MED ORDER — LAMOTRIGINE 200 MG PO TABS
ORAL_TABLET | ORAL | 0 refills | Status: DC
  Filled 2021-10-01: qty 30, 30d supply, fill #0

## 2021-10-01 MED ORDER — LITHIUM CARBONATE ER 300 MG PO TBCR
900.0000 mg | EXTENDED_RELEASE_TABLET | Freq: Every day | ORAL | 0 refills | Status: DC
  Filled 2021-10-01: qty 90, 30d supply, fill #0
  Filled 2021-11-07: qty 90, 30d supply, fill #1
  Filled 2021-11-19: qty 90, 30d supply, fill #2

## 2021-10-01 MED ORDER — PRAMIPEXOLE DIHYDROCHLORIDE 1.5 MG PO TABS
ORAL_TABLET | ORAL | 0 refills | Status: DC
  Filled 2021-10-01: qty 30, 30d supply, fill #0
  Filled 2021-11-19: qty 30, 30d supply, fill #1
  Filled 2022-01-05: qty 30, 30d supply, fill #2

## 2021-10-01 MED ORDER — ESCITALOPRAM OXALATE 5 MG PO TABS
ORAL_TABLET | ORAL | 0 refills | Status: DC
  Filled 2021-10-01: qty 60, 16d supply, fill #0

## 2021-10-02 ENCOUNTER — Other Ambulatory Visit: Payer: Self-pay

## 2021-10-03 ENCOUNTER — Other Ambulatory Visit: Payer: Self-pay

## 2021-10-09 ENCOUNTER — Other Ambulatory Visit (HOSPITAL_COMMUNITY): Payer: Self-pay

## 2021-10-10 ENCOUNTER — Other Ambulatory Visit (HOSPITAL_COMMUNITY): Payer: Self-pay

## 2021-10-10 ENCOUNTER — Other Ambulatory Visit: Payer: Self-pay

## 2021-10-11 ENCOUNTER — Other Ambulatory Visit: Payer: Self-pay

## 2021-10-26 NOTE — Progress Notes (Signed)
? ?Established Patient Office Visit ? ?Subjective:  ?Patient ID: [name] [name], female    DOB: 1986-07-18  Age: 36 y.o. MRN: [id number] ? ?CC:  ?Chief Complaint  ?Patient presents with  ? Follow-up  ?  3mth: chronic conditions  ? ? ?HPI ?07/08/21 ?[name] A Sciascia presents for post emergency room follow-up.  Patient was in the emergency room between the 28th and 29 November with lower extremity weakness.  Below is documentation from that emergency room visit ? ?36 yo female presets with lower extremity weakness.  Patient has had URI symptoms and was treated for sinusitis with Augmentin.  [name] [name] felt better from the ninth of the 19th when she was on the Augmentin.  She was off of it over the weekend and had some fevers.  She has now noted difficulty walking.  She feels that she is weak and she has to shuffle.  She has difficulty standing up from a seated position.  She has also had cough with this.  She has a history of asthma.  She has had some difficulty breathing and has had to have her asthma medications adjusted.  She does not feel that she is having trouble with her feet or ankles.  She denies any numbness.  Patient was initially seen and evaluated at urgent care which had a chest x-ray done, with minimal basilar atelectasis, normal sed rate, normal CBC and negative POC flu and mono testing.  She is then seen here and screened.  MR of the head and neck were ordered.  Neurology was consulted via phone.  She has not had her MRI and is to be reevaluated. ?36 year old female with recent febrile illness, also recently on antibiotics who presents today with proximal leg weakness difficulty walking.   Patient with lower extremity weakness.  Concern for Candie EchevariaGuillaine Barre discussed with Dr. Otelia LimesLindzen.  Plan admission to medicine for observation and mr of thoracic and lumbar spine. ?Patient now wishes to leave.  I have discussed risks with patient including respiratory distress and arrest.  We have discussed return  precautions.  We discussed that she can return at any time if she is feeling worse.  They do live within the city and her husband will be with her. ?Pt left AMA  needs Thoracic /lumbar MRI ? ?The patient did leave AGAINST MEDICAL ADVICE was going to be admitted for observation and have additional testing for potential Guillain-Barr?.  The patient is self-pay.  The patient states her symptoms have progressed.  She now has to use a Rollator to walk.  She has extreme weakness in her lower extremities and also now in her upper arm.  She has sensory changes in her feet as well.  She has severe muscle aches in the lower extremity.  He has difficulty sleeping and low-grade temperatures to 100 degrees.  She has loose stools.  Patient also has difficulty taking a deep breath.  She states there is a rash over the back of her neck.  She did take the 50 mg prednisone given to her in the emergency room and finished this without any change in symptoms. ? ?3/27 ?Was sent to ED last Ov and admitted as noted below ?Date of Admission: 07/08/2021                      Date of Discharge: 07/09/2021 ?Admitting Physician: Moses MannersWilliam A Hensel, MD ?  ?Primary Care Provider: Storm FriskWright, Jaylani Mcguinn E, MD ?Consultants: Neurology ?  ?Indication for Hospitalization: Weakness ?  ?  Discharge Diagnoses/Problem List:  ?Principal Problem: ?  Weakness ?  ?Disposition: Home with HH PT ?  ?Discharge Condition: Stable ?  ?Discharge Exam: From same day progress note: ?General: well appearing, NAD  ?Cardiovascular: regular rate and rhythm, no murmur appreciated.  ?Respiratory: clear, no wheezing or crackles  ?Abdomen: soft, non-tender  ?Extremities: no peripheral edema  ?Neuro: Strength 5/5 bilaterally in UE and LE. No focal deficits, no facial drooping, slurred speech, changes in vision.  ?  ?Brief Hospital Course:  ?[name] [name] is a 36 y.o. female who presented with 10 days of weakness and myalgias. PMH is significant for moderate persistent asthma, Bipolar I  disorder, chronic allergic rhinitis, Class 3 obesity. Her hospital course is outlined below:  ?  ?Weakness  Muscular Tenderness  Body Aches ?Patient presented with 10 days of weakness, body aches, and diffuse muscular tenderness. Initial workup with MRI of brain and spine were unremarkable with no evidence of demyelinating disease. Patient had intact reflexes and isolated muscle groups appeared to have intact strength. Presentation was less concerning for [name] Syndrome. Initial labs showed CK 33, CRP 2.0, sed rate 8, cortisol am 9.8, and no electrolyte abnormalities. Neurology began workup for rheumatologic disease, with several labs pending at the time of discharge (see below). If rheumatologic workup negative, differential includes inflammatory myositis vs fibromyalgia vs functional disorder. Neuro recommended outpatient follow up with Dr. Nita Sickle in 2-4 weeks. Patient was evaluated by physical therapy and occupational therapy who recommended outpatient PT.  ?  ?Items for PCP Follow-up: ?Follow up with psychiatry for bipolar disorder to optimize treatment.  ?Follow up on autoimmune lab results.  ?Outpatient PT follow up.  ?Follow up with Neurology, Dr. Allena Katz  ?  ?  ?Significant Procedures: None ?  ?Significant Labs and Imaging:  ?Last Labs   ?   ?Recent Labs  ?Lab 07/08/21 ?1235  ?WBC 12.1*  ?HGB 13.5  ?HCT 42.3  ?PLT 366  ?  ?  ?Last Labs   ?   ?Recent Labs  ?Lab 07/08/21 ?1235  ?NA 134*  ?K 4.4  ?CL 100  ?CO2 26  ?GLUCOSE 118*  ?BUN 10  ?CREATININE 0.74  ?CALCIUM 9.1  ?  ?  ?Since discharge the patient has been discerned to have had a post viral myositis with resulting muscle weakness.  Her neurologic work-up otherwise was negative.  She has no autoimmune conditions on work-up.  Patient has had several rounds of physical therapy ending in January.  Patient is feeling better in this regard.  She feels like she is back to baseline.  She continues to follow-up with her mental health provider for  her bipolar disorder.  She needs a lithium level checked and her Lamictal refilled at this visit.  Patient's been using albuterol quite a bit recently.  She has a dry cough that wakes her up at night.  She denies any reflux symptoms.  She has significant rhinitis and nasal drainage.  She tends to sleep with her mouth open because her nose is obstructed. ? ?Note patient does need a Pap smear.  Her pisq suggest that she does not want to have further children and is interested in exploring birth control measures.  Note she is self-pay.  She does have the orange card.  Because of elevated blood sugars in the hospital she would like an A1c repeated. ? ?  ? ?Past Medical History:  ?Diagnosis Date  ? Anxiety   ? Brain fog 06/12/2021  ? Depression   ?  Phreesia 06/30/2020  ? GERD (gastroesophageal reflux disease)   ? History of kidney stones   ? Lower extremity weakness 07/08/2021  ? Pneumonitis 03/02/2019  ? Sinusitis, acute 01/01/2021  ? Weakness 07/08/2021  ? ? ?Past Surgical History:  ?Procedure Laterality Date  ? CYSTOSCOPY WITH RETROGRADE PYELOGRAM, URETEROSCOPY AND STENT PLACEMENT Right 12/10/2016  ? Procedure: CYSTOSCOPY WITH RETROGRADE PYELOGRAM, URETEROSCOPY AND STONE REMOVAL;  Surgeon: Heloise Purpura, MD;  Location: [facility];  Service: Urology;  Laterality: Right;  ? WISDOM TOOTH EXTRACTION  at age 88  ? ? ?Family History  ?Problem Relation Age of Onset  ? Depression Mother   ? Anxiety disorder Mother   ? ? ?Social History  ? ?Socioeconomic History  ? Marital status: Significant Other  ?  Spouse name: Not on file  ? Number of children: Not on file  ? Years of education: Not on file  ? Highest education level: Not on file  ?Occupational History  ? Not on file  ?Tobacco Use  ? Smoking status: Never  ? Smokeless tobacco: Never  ?Vaping Use  ? Vaping Use: Never used  ?Substance and Sexual Activity  ? Alcohol use: Yes  ?  Alcohol/week: 1.0 standard drink  ?  Types: 1 Glasses of wine per week  ?  Comment: per week  ? Drug use:  No  ? Sexual activity: Yes  ?  Birth control/protection: I.U.D.  ?Other Topics Concern  ? Not on file  ?Social History Narrative  ? Not on file  ? ?Social Determinants of Health  ? ?Financial Resource Strain: No

## 2021-10-28 ENCOUNTER — Other Ambulatory Visit: Payer: Self-pay

## 2021-10-28 ENCOUNTER — Encounter: Payer: Self-pay | Admitting: Critical Care Medicine

## 2021-10-28 ENCOUNTER — Ambulatory Visit
Payer: No Typology Code available for payment source | Attending: Critical Care Medicine | Admitting: Critical Care Medicine

## 2021-10-28 VITALS — BP 121/81 | HR 88 | Temp 98.9°F | Resp 18 | Ht 64.0 in | Wt 308.0 lb

## 2021-10-28 DIAGNOSIS — J3089 Other allergic rhinitis: Secondary | ICD-10-CM

## 2021-10-28 DIAGNOSIS — Z3009 Encounter for other general counseling and advice on contraception: Secondary | ICD-10-CM | POA: Insufficient documentation

## 2021-10-28 DIAGNOSIS — Z5181 Encounter for therapeutic drug level monitoring: Secondary | ICD-10-CM | POA: Insufficient documentation

## 2021-10-28 DIAGNOSIS — R531 Weakness: Secondary | ICD-10-CM

## 2021-10-28 DIAGNOSIS — R4189 Other symptoms and signs involving cognitive functions and awareness: Secondary | ICD-10-CM

## 2021-10-28 DIAGNOSIS — R29898 Other symptoms and signs involving the musculoskeletal system: Secondary | ICD-10-CM

## 2021-10-28 DIAGNOSIS — Z6841 Body Mass Index (BMI) 40.0 and over, adult: Secondary | ICD-10-CM

## 2021-10-28 DIAGNOSIS — J4540 Moderate persistent asthma, uncomplicated: Secondary | ICD-10-CM

## 2021-10-28 MED ORDER — LANSOPRAZOLE 30 MG PO CPDR
30.0000 mg | DELAYED_RELEASE_CAPSULE | Freq: Every day | ORAL | 0 refills | Status: DC
  Filled 2021-10-28: qty 30, 30d supply, fill #0
  Filled 2021-11-19: qty 30, 30d supply, fill #1
  Filled 2022-01-05 – 2022-01-06 (×2): qty 30, 30d supply, fill #2

## 2021-10-28 MED ORDER — LAMOTRIGINE 200 MG PO TABS
200.0000 mg | ORAL_TABLET | Freq: Two times a day (BID) | ORAL | 2 refills | Status: DC
  Filled 2021-10-28: qty 60, 30d supply, fill #0

## 2021-10-28 MED ORDER — LAMOTRIGINE 200 MG PO TABS
200.0000 mg | ORAL_TABLET | Freq: Every day | ORAL | 2 refills | Status: DC
  Filled 2021-10-28: qty 90, 90d supply, fill #0
  Filled 2021-11-19: qty 90, 90d supply, fill #1
  Filled 2022-01-05: qty 90, 90d supply, fill #2

## 2021-10-28 MED ORDER — AZELASTINE HCL 0.1 % NA SOLN
2.0000 | Freq: Two times a day (BID) | NASAL | 12 refills | Status: DC
  Filled 2021-10-28: qty 30, 50d supply, fill #0

## 2021-10-28 MED ORDER — MONTELUKAST SODIUM 10 MG PO TABS
10.0000 mg | ORAL_TABLET | Freq: Every day | ORAL | 0 refills | Status: DC
  Filled 2021-10-28: qty 30, 30d supply, fill #0
  Filled 2021-11-19: qty 30, 30d supply, fill #1
  Filled 2022-01-05 – 2022-01-06 (×2): qty 30, 30d supply, fill #2
  Filled 2022-01-31 – 2022-02-12 (×4): qty 30, 30d supply, fill #3
  Filled 2022-04-08: qty 30, 30d supply, fill #4
  Filled 2022-04-25 – 2022-05-05 (×2): qty 30, 30d supply, fill #5

## 2021-10-28 MED ORDER — ALBUTEROL SULFATE HFA 108 (90 BASE) MCG/ACT IN AERS
2.0000 | INHALATION_SPRAY | Freq: Four times a day (QID) | RESPIRATORY_TRACT | 1 refills | Status: DC | PRN
Start: 2021-10-28 — End: 2022-01-06
  Filled 2021-10-28: qty 18, 25d supply, fill #0
  Filled 2021-11-19: qty 18, 25d supply, fill #1

## 2021-10-28 NOTE — Assessment & Plan Note (Signed)
Patient is a vegan we did this to assess avoiding processed vegan foods and discussed eating fresh fruits and vegetables and I offered the patient a lifestyle medicine handout also recommended walking 150 minutes weekly and 30-minute intervals divided on a daily basis on level ground paved surface ?

## 2021-10-28 NOTE — Assessment & Plan Note (Signed)
Significant allergic rhinitis persists will continue with Rhinocort Singulair Zyrtec but add Astelin 2 sprays each nostril twice daily and simple saline nasal rinse ?

## 2021-10-28 NOTE — Assessment & Plan Note (Signed)
Patient is on lithium and per psychiatry they have asked for blood levels we will check lithium level and give result to her mental health provider who is [facility] in [place] ?

## 2021-10-28 NOTE — Patient Instructions (Addendum)
Please review the lifestyle medicine handout particular regards to exercise program I recommend 150 minutes a week of brisk walking divided up in 20 to 30 minutes intervals on level ground paved surface ? ?Lithium level and hemoglobin A1c will be assessed today ? ?Begin Astelin 2 sprays each nostril twice daily and use simple saline nasal spray 2 sprays 3 times daily each nostril and stay on Rhinocort but increase it to twice a day ? ?Refills on Lamictal Singulair and Prevacid sent to our pharmacy ? ?We will connect you with mobile medicine unit with provider Mayers who can perform a Pap smear and advise you and place the Nexplanon device if indicated or can dispense oral birth control, we will contact you as to the schedule ? ?Return to see Dr. Delford Field 4 months ? ? ?

## 2021-10-28 NOTE — Assessment & Plan Note (Signed)
This has resolved was diagnosed as having a postviral myositis ?

## 2021-10-28 NOTE — Progress Notes (Signed)
Patient has not eaten today. ?Patient has taken medication today. ?Patient denies pain at this time. ?Patient request A1C and lithium level check. ?

## 2021-10-28 NOTE — Assessment & Plan Note (Signed)
This has resolved.

## 2021-10-28 NOTE — Assessment & Plan Note (Signed)
Patient's PI SQ is positive and that she wants contraception does not desire to have further children ? ?Patient asked about Nexplanon and I think she would be a good candidate for same ? ?Patient needs a Pap smear ? ?We will refer the patient to mobile medicine where a Pap smear can be performed and a Nexplanon device placed ?

## 2021-10-28 NOTE — Assessment & Plan Note (Signed)
Moderate persistent asthma not currently exacerbating would like to avoid systemic steroids given her mental health conditions and recent myopathy resolution ? ?Patient would benefit from enhanced antihistamine therapy ? ?We will continue Breo and albuterol as prescribed continue Singulair and refills given ?

## 2021-10-29 LAB — HEMOGLOBIN A1C
Est. average glucose Bld gHb Est-mCnc: 105 mg/dL
Hgb A1c MFr Bld: 5.3 % (ref 4.8–5.6)

## 2021-10-29 LAB — LITHIUM LEVEL: Lithium Lvl: 0.8 mmol/L (ref 0.5–1.2)

## 2021-10-30 ENCOUNTER — Telehealth: Payer: Self-pay

## 2021-10-30 NOTE — Telephone Encounter (Signed)
-----   Message from Storm Frisk, MD sent at 10/29/2021  8:46 AM EDT ----- ?Call pt and tell her lithium level normal A1C normal no diabetes ? ?Fax lithium result to Dr [facility] psychiatry [facility] ?

## 2021-10-30 NOTE — Telephone Encounter (Signed)
Called pt but number was out of service  ?

## 2021-10-31 ENCOUNTER — Telehealth: Payer: Self-pay

## 2021-10-31 NOTE — Telephone Encounter (Signed)
Called trying to get in touch with [name] ?

## 2021-11-04 ENCOUNTER — Ambulatory Visit: Payer: Self-pay

## 2021-11-04 NOTE — Telephone Encounter (Signed)
Pt called, advised her that the azelastine nasal spray can cause drowsiness and if she needs to just use it at night she can. Pt asked if this was going to be a long term medication. I advised her I would take it as long as allergies are bothering her and if she feels its not needed once weather stays consistent and pollen slows down could use it as needed. Pt verbalized understanding and had no further questions.  ? ?Summary: ? about if nasal spray could be making her sleepy  ? Pt called in wants to know if allergies could be making her sleepy or nasal spraym med, if she needs to switch that out or take it at night. Please call back   ?  ? ?Reason for Disposition ? Caller has medicine question only, adult not sick, AND triager answers question ? ?Answer Assessment - Initial Assessment Questions ?1. NAME of MEDICATION: "What medicine are you calling about?" ?   ?Azelastine nasal spray ? ?2. QUESTION: "What is your question?" (e.g., double dose of medicine, side effect) ?    Is it causing drowsiness ?3. PRESCRIBING HCP: "Who prescribed it?" Reason: if prescribed by specialist, call should be referred to that group. ?    Dr. Delford Field ?4. SYMPTOMS: "Do you have any symptoms?" ?    Drowsiness during day time ? ?Protocols used: Medication Question Call-A-AH ? ?

## 2021-11-07 ENCOUNTER — Other Ambulatory Visit: Payer: Self-pay

## 2021-11-07 ENCOUNTER — Other Ambulatory Visit: Payer: Self-pay | Admitting: Critical Care Medicine

## 2021-11-08 ENCOUNTER — Other Ambulatory Visit: Payer: Self-pay

## 2021-11-08 MED ORDER — BUDESONIDE 32 MCG/ACT NA SUSP
2.0000 | Freq: Every day | NASAL | 0 refills | Status: DC
Start: 2021-11-08 — End: 2022-01-07
  Filled 2021-11-08: qty 5, fill #0

## 2021-11-19 ENCOUNTER — Other Ambulatory Visit (HOSPITAL_COMMUNITY): Payer: Self-pay

## 2021-11-19 ENCOUNTER — Other Ambulatory Visit: Payer: Self-pay | Admitting: Critical Care Medicine

## 2021-11-20 ENCOUNTER — Other Ambulatory Visit (HOSPITAL_COMMUNITY): Payer: Self-pay

## 2021-11-20 MED ORDER — ESCITALOPRAM OXALATE 20 MG PO TABS
20.0000 mg | ORAL_TABLET | Freq: Every day | ORAL | 3 refills | Status: DC
  Filled 2021-11-20: qty 60, 60d supply, fill #0
  Filled 2022-01-05 – 2022-01-14 (×3): qty 60, 60d supply, fill #1

## 2021-11-27 ENCOUNTER — Ambulatory Visit: Payer: No Typology Code available for payment source | Admitting: Internal Medicine

## 2021-12-02 ENCOUNTER — Ambulatory Visit: Payer: No Typology Code available for payment source | Admitting: Critical Care Medicine

## 2021-12-12 ENCOUNTER — Other Ambulatory Visit: Payer: Self-pay

## 2021-12-12 MED ORDER — LITHIUM CARBONATE ER 300 MG PO TBCR
900.0000 mg | EXTENDED_RELEASE_TABLET | Freq: Every day | ORAL | 0 refills | Status: DC
  Filled 2021-12-12: qty 90, 30d supply, fill #0
  Filled 2022-01-05: qty 270, 90d supply, fill #0
  Filled 2022-01-06: qty 90, 30d supply, fill #0
  Filled 2022-01-31 – 2022-04-08 (×4): qty 90, 30d supply, fill #1

## 2021-12-12 MED ORDER — PRAMIPEXOLE DIHYDROCHLORIDE 1.5 MG PO TABS
ORAL_TABLET | ORAL | 0 refills | Status: DC
  Filled 2021-12-12: qty 30, 30d supply, fill #0
  Filled 2022-01-05 – 2022-01-14 (×4): qty 30, 30d supply, fill #1
  Filled 2022-04-08: qty 30, 30d supply, fill #2

## 2021-12-13 ENCOUNTER — Other Ambulatory Visit: Payer: Self-pay

## 2021-12-19 ENCOUNTER — Other Ambulatory Visit: Payer: Self-pay

## 2021-12-19 ENCOUNTER — Ambulatory Visit (INDEPENDENT_AMBULATORY_CARE_PROVIDER_SITE_OTHER): Payer: Self-pay | Admitting: Physician Assistant

## 2021-12-19 ENCOUNTER — Encounter: Payer: Self-pay | Admitting: Physician Assistant

## 2021-12-19 VITALS — BP 136/82 | HR 93 | Temp 98.7°F | Resp 18 | Ht 64.0 in | Wt 306.0 lb

## 2021-12-19 DIAGNOSIS — Z3009 Encounter for other general counseling and advice on contraception: Secondary | ICD-10-CM

## 2021-12-19 DIAGNOSIS — Z6841 Body Mass Index (BMI) 40.0 and over, adult: Secondary | ICD-10-CM

## 2021-12-19 NOTE — Progress Notes (Signed)
Established Patient Office Visit  Subjective   Patient ID: [name] [name], female    DOB: [birth year]  Age: 36 y.o. MRN: [id number]  Chief Complaint  Patient presents with   Contraception    States that she wants to be referred to gynecology so she can discuss tubal ligation.  States that she does not want to have any children, has previously had Mirena for birth control but did not like the procedure of having it placed or removed.  States that she has previously used oral contraceptives, but states that she does not feel she is good at taking medication on a daily basis.  States that she is currently using condoms for birth control.      Past Medical History:  Diagnosis Date   Anxiety    Brain fog 06/12/2021   Depression    Phreesia 06/30/2020   GERD (gastroesophageal reflux disease)    History of kidney stones    Lower extremity weakness 07/08/2021   Pneumonitis 03/02/2019   Sinusitis, acute 01/01/2021   Weakness 07/08/2021   Social History   Socioeconomic History   Marital status: Significant Other    Spouse name: Not on file   Number of children: Not on file   Years of education: Not on file   Highest education level: Not on file  Occupational History   Not on file  Tobacco Use   Smoking status: Never   Smokeless tobacco: Never  Vaping Use   Vaping Use: Never used  Substance and Sexual Activity   Alcohol use: Yes    Alcohol/week: 1.0 standard drink    Types: 1 Glasses of wine per week    Comment: per week   Drug use: No   Sexual activity: Yes    Birth control/protection: I.U.D.  Other Topics Concern   Not on file  Social History Narrative   Not on file   Social Determinants of Health   Financial Resource Strain: Not on file  Food Insecurity: Not on file  Transportation Needs: Not on file  Physical Activity: Not on file  Stress: Not on file  Social Connections: Not on file  Intimate Partner Violence: Not on file   Family History  Problem  Relation Age of Onset   Depression Mother    Anxiety disorder Mother    Allergies  Allergen Reactions   Other     Bananas, kiwi   Wellbutrin [Bupropion] Hives      Review of Systems  Constitutional: Negative.   HENT: Negative.    Eyes: Negative.   Respiratory:  Negative for shortness of breath.   Cardiovascular:  Negative for chest pain.  Gastrointestinal: Negative.   Genitourinary: Negative.   Musculoskeletal: Negative.   Skin: Negative.   Neurological: Negative.   Endo/Heme/Allergies: Negative.   Psychiatric/Behavioral: Negative.       Objective:     BP 136/82 (BP Location: Right Arm, Patient Position: Sitting, Cuff Size: Normal)   Pulse 93   Temp 98.7 F (37.1 C) (Oral)   Resp 18   Ht 5\' 4"  (1.626 m)   Wt (!) 306 lb (138.8 kg)   SpO2 98%   BMI 52.52 kg/m  Wt Readings from Last 3 Encounters:  12/19/21 (!) 306 lb (138.8 kg)  10/28/21 (!) 308 lb (139.7 kg)  08/07/21 (!) 310 lb (140.6 kg)      Physical Exam Vitals and nursing note reviewed.  Constitutional:      Appearance: Normal appearance. She is obese.  HENT:  Head: Normocephalic and atraumatic.     Right Ear: External ear normal.     Left Ear: External ear normal.     Nose: Nose normal.     Mouth/Throat:     Mouth: Mucous membranes are moist.     Pharynx: Oropharynx is clear.  Eyes:     Extraocular Movements: Extraocular movements intact.     Conjunctiva/sclera: Conjunctivae normal.     Pupils: Pupils are equal, round, and reactive to light.  Cardiovascular:     Rate and Rhythm: Normal rate.     Pulses: Normal pulses.     Heart sounds: Normal heart sounds.  Pulmonary:     Effort: Pulmonary effort is normal.     Breath sounds: Normal breath sounds.  Musculoskeletal:        General: Normal range of motion.     Cervical back: Normal range of motion.  Skin:    General: Skin is warm and dry.  Neurological:     General: No focal deficit present.     Mental Status: She is alert and oriented  to person, place, and time.  Psychiatric:        Mood and Affect: Mood normal.        Behavior: Behavior normal.        Thought Content: Thought content normal.        Judgment: Judgment normal.       Assessment & Plan:   Problem List Items Addressed This Visit       Other   Class 3 severe obesity due to excess calories without serious comorbidity with body mass index (BMI) of 45.0 to 49.9 in adult [facility])   Other Visit Diagnoses     Encounter for other general counseling or advice on contraception    -  Primary   Relevant Orders   Ambulatory referral to Gynecology     1. Encounter for other general counseling or advice on contraception Patient education given on contraceptive choices. - Ambulatory referral to Gynecology  2. Class 3 severe obesity due to excess calories without serious comorbidity with body mass index (BMI) of 50.0 to 59.9 in adult [facility])    I have reviewed the patient's medical history (PMH, PSH, Social History, Family History, Medications, and allergies) , and have been updated if relevant. I spent 30 minutes reviewing chart and  face to face time with patient.     Return if symptoms worsen or fail to improve.    Kasandra Knudsen Mayers, PA-C

## 2021-12-19 NOTE — Patient Instructions (Signed)
Laparoscopic Tubal Ligation Laparoscopic tubal ligation is a procedure to close the fallopian tubes. This is done to prevent pregnancy. When the fallopian tubes are closed, the eggs that your ovaries release cannot enter the uterus, and sperm cannot reach the released eggs. You should not have this procedure if you want to get pregnant someday or if you are unsure about having more children. Tell a health care provider about: Any allergies you have. All medicines you are taking, including vitamins, herbs, eye drops, creams, and over-the-counter medicines. Any problems you or family members have had with anesthetic medicines. Any blood disorders you have. Any surgeries you have had. Any medical conditions you have. Whether you are pregnant or may be pregnant. Any past pregnancies. What are the risks? Generally, this is a safe procedure. However, problems may occur, including: Infection. Bleeding. Injury to other organs in the abdomen. Side effects from anesthetic medicines. Failure of the procedure. This procedure can increase your risk of an ectopic pregnancy. This is a pregnancy in which a fertilized egg attaches to the outside of the uterus. What happens before the procedure? Staying hydrated Follow instructions from your health care provider about hydration, which may include: Up to 2 hours before the procedure - you may continue to drink clear liquids, such as water, clear fruit juice, black coffee, and plain tea. Eating and drinking restrictions Follow instructions from your health care provider about eating and drinking, which may include: 8 hours before the procedure - stop eating heavy meals or foods, such as meat, fried foods, or fatty foods. 6 hours before the procedure - stop eating light meals or foods, such as toast or cereal. 6 hours before the procedure - stop drinking milk or drinks that contain milk. 2 hours before the procedure - stop drinking clear  liquids. Medicines Ask your health care provider about: Changing or stopping your regular medicines. This is especially important if you are taking diabetes medicines or blood thinners. Taking medicines such as aspirin and ibuprofen. These medicines can thin your blood. Do not take these medicines unless your health care provider tells you to take them. Taking over-the-counter medicines, vitamins, herbs, and supplements. Surgery safety Ask your health care provider: How your surgery site will be marked. What steps will be taken to help prevent infection. These steps may include: Removing hair at the surgery site. Washing skin with a germ-killing soap. Taking antibiotic medicine. General instructions Do not use any products that contain nicotine or tobacco for at least 4 weeks before the procedure. These products include cigarettes, chewing tobacco, and vaping devices, such as e-cigarettes. If you need help quitting, ask your health care provider. Plan to have someone take you home from the hospital. If you will be going home right after the procedure, plan to have a responsible adult care for you for the time you are told. This is important. What happens during the procedure?     An IV will be inserted into one of your veins. You will be given one or more of the following: A medicine to help you relax (sedative). A medicine to numb the area (local anesthetic). A medicine to make you fall asleep (general anesthetic). A medicine that is injected into an area of your body to numb everything below the injection site (regional anesthetic). Your bladder may be emptied with a small tube (catheter). If you have been given a general anesthetic, a tube will be put down your throat to help you breathe. Two small incisions will   be made in your lower abdomen and near your belly button. Your abdomen will be inflated with a gas. This will let the surgeon see better and will give the surgeon room to  work. A lighted tube with camera (laparoscope) will be inserted into your abdomen through one of the incisions. Small instruments will be inserted through the other incision. The fallopian tubes will be tied off, burned (cauterized), or blocked with a clip, ring, or clamp. A small portion in the center of each fallopian tube may be removed. The gas will be released from the abdomen. The incisions will be closed with stitches (sutures). A bandage (dressing) will be placed over the incisions. The procedure may vary among health care providers and hospitals. What happens after the procedure? Your blood pressure, heart rate, breathing rate, and blood oxygen level will be monitored until you leave the hospital. You will be given medicine to help with pain, nausea, and vomiting as needed. You may have vaginal discharge after the procedure. You may need to wear a sanitary napkin. If you were given a sedative during the procedure, it can affect you for several hours. Do not drive or operate machinery until your health care provider says that it is safe. Summary Laparoscopic tubal ligation is a procedure that is done to prevent pregnancy. You should not have this procedure if you want to get pregnant someday or if you are unsure about having more children. The procedure is done using a thin, lighted tube (laparoscope) with a camera attached that will be inserted into your abdomen through an incision. After the procedure you will be given medicine to help with pain, nausea, and vomiting as needed. Plan to have someone take you home from the hospital. This information is not intended to replace advice given to you by your health care provider. Make sure you discuss any questions you have with your health care provider. Document Revised: 04/06/2020 Document Reviewed: 04/06/2020 Elsevier Patient Education  2023 [employer]  

## 2021-12-19 NOTE — Progress Notes (Signed)
Patient would like to discuss sterilization or another permanent/semi permanent method. Patient had Mirena IUD previously and was "fine" with no or irregular cycles. Patient disliked the insertion procedure and discomfort. Patient and partner have agreed to not have children. Patient  delivered a baby as a teen that her parents forced her to give up for adoption.

## 2021-12-20 ENCOUNTER — Other Ambulatory Visit: Payer: Self-pay

## 2022-01-02 ENCOUNTER — Ambulatory Visit: Payer: Self-pay

## 2022-01-02 NOTE — Telephone Encounter (Signed)
      Chief Complaint: Diarrhea x 3-4 months. Not daily, but several times a week. Symptoms: Nausea, vomited x 1 today. Does not know Frequency: 3-4 months Pertinent Negatives: Patient denies fever or blood in stool Disposition: [] ED /[] Urgent Care (no appt availability in office) / [] Appointment(In office/virtual)/ []  [facility]/ [] Home Care/ [] Refused Recommended Disposition /[] [facility]/ [x]  Follow-up with PCP Additional Notes: Pt. Asking to be worked in. No availability until end of June,please advise.  Answer Assessment - Initial Assessment Questions 1. DIARRHEA SEVERITY: "How bad is the diarrhea?" "How many more stools have you had in the past 24 hours than normal?"    - NO DIARRHEA (SCALE 0)   - MILD (SCALE 1-3): Few loose or mushy BMs; increase of 1-3 stools over normal daily number of stools; mild increase in ostomy output.   -  MODERATE (SCALE 4-7): Increase of 4-6 stools daily over normal; moderate increase in ostomy output. * SEVERE (SCALE 8-10; OR 'WORST POSSIBLE'): Increase of 7 or more stools daily over normal; moderate increase in ostomy output; incontinence.     Every other day 2. ONSET: "When did the diarrhea begin?"      3-4 months ago 3. BM CONSISTENCY: "How loose or watery is the diarrhea?"      Loose 4. VOMITING: "Are you also vomiting?" If Yes, ask: "How many times in the past 24 hours?"       X 1 today 5. ABDOMINAL PAIN: "Are you having any abdominal pain?" If Yes, ask: "What does it feel like?" (e.g., crampy, dull, intermittent, constant)      Mild 6. ABDOMINAL PAIN SEVERITY: If present, ask: "How bad is the pain?"  (e.g., Scale 1-10; mild, moderate, or severe)   - MILD (1-3): doesn't interfere with normal activities, abdomen soft and not tender to touch    - MODERATE (4-7): interferes with normal activities or awakens from sleep, abdomen tender to touch    - SEVERE (8-10): excruciating pain, doubled over, unable to do any normal  activities       Mild 7. ORAL INTAKE: If vomiting, "Have you been able to drink liquids?" "How much liquids have you had in the past 24 hours?"     Yes 8. HYDRATION: "Any signs of dehydration?" (e.g., dry mouth [not just dry lips], too weak to stand, dizziness, new weight loss) "When did you last urinate?"     No 9. EXPOSURE: "Have you traveled to a foreign country recently?" "Have you been exposed to anyone with diarrhea?" "Could you have eaten any food that was spoiled?"     No 10. ANTIBIOTIC USE: "Are you taking antibiotics now or have you taken antibiotics in the past 2 months?"       No 11. OTHER SYMPTOMS: "Do you have any other symptoms?" (e.g., fever, blood in stool)       No 12. PREGNANCY: "Is there any chance you are pregnant?" "When was your last menstrual period?"       No  Protocols used: [facility]

## 2022-01-06 ENCOUNTER — Other Ambulatory Visit: Payer: Self-pay

## 2022-01-06 ENCOUNTER — Other Ambulatory Visit (HOSPITAL_COMMUNITY): Payer: Self-pay

## 2022-01-06 ENCOUNTER — Other Ambulatory Visit: Payer: Self-pay | Admitting: Critical Care Medicine

## 2022-01-06 NOTE — Telephone Encounter (Signed)
Called pt and appt was scheduled..

## 2022-01-07 ENCOUNTER — Other Ambulatory Visit: Payer: Self-pay

## 2022-01-07 ENCOUNTER — Encounter: Payer: Self-pay | Admitting: Critical Care Medicine

## 2022-01-07 ENCOUNTER — Ambulatory Visit
Payer: No Typology Code available for payment source | Attending: Critical Care Medicine | Admitting: Critical Care Medicine

## 2022-01-07 VITALS — BP 117/83 | HR 87 | Wt 304.4 lb

## 2022-01-07 DIAGNOSIS — Z91018 Allergy to other foods: Secondary | ICD-10-CM

## 2022-01-07 DIAGNOSIS — R197 Diarrhea, unspecified: Secondary | ICD-10-CM

## 2022-01-07 DIAGNOSIS — R1013 Epigastric pain: Secondary | ICD-10-CM

## 2022-01-07 DIAGNOSIS — J4540 Moderate persistent asthma, uncomplicated: Secondary | ICD-10-CM

## 2022-01-07 HISTORY — DX: Epigastric pain: R10.13

## 2022-01-07 MED ORDER — ALBUTEROL SULFATE HFA 108 (90 BASE) MCG/ACT IN AERS
2.0000 | INHALATION_SPRAY | Freq: Four times a day (QID) | RESPIRATORY_TRACT | 1 refills | Status: DC | PRN
  Filled 2022-01-07: qty 18, 25d supply, fill #0
  Filled 2022-05-05: qty 18, 25d supply, fill #1

## 2022-01-07 NOTE — Progress Notes (Signed)
Established Patient Office Visit  Subjective:  Patient ID: [name] [name], female    DOB: Dec 02, 1985  Age: 36 y.o. MRN: [id number]  CC:  Chief Complaint  Patient presents with   Sinus Problem    HPI 07/08/21 [name] [name] presents for post emergency room follow-up.  Patient was in the emergency room between the 28th and 29 November with lower extremity weakness.  Below is documentation from that emergency room visit  36 yo female presets with lower extremity weakness.  Patient has had URI symptoms and was treated for sinusitis with Augmentin.  [name] felt better from the ninth of the 19th when she was on the Augmentin.  She was off of it over the weekend and had some fevers.  She has now noted difficulty walking.  She feels that she is weak and she has to shuffle.  She has difficulty standing up from a seated position.  She has also had cough with this.  She has a history of asthma.  She has had some difficulty breathing and has had to have her asthma medications adjusted.  She does not feel that she is having trouble with her feet or ankles.  She denies any numbness.  Patient was initially seen and evaluated at urgent care which had a chest x-ray done, with minimal basilar atelectasis, normal sed rate, normal CBC and negative POC flu and mono testing.  She is then seen here and screened.  MR of the head and neck were ordered.  Neurology was consulted via phone.  She has not had her MRI and is to be reevaluated. 36 year old female with recent febrile illness, also recently on antibiotics who presents today with proximal leg weakness difficulty walking.   Patient with lower extremity weakness.  Concern for [name] discussed with Dr. Cheral Marker.  Plan admission to medicine for observation and mr of thoracic and lumbar spine. Patient now wishes to leave.  I have discussed risks with patient including respiratory distress and arrest.  We have discussed return precautions.  We discussed that  she can return at any time if she is feeling worse.  They do live within the city and her husband will be with her. Pt left AMA  needs Thoracic /lumbar MRI  The patient did leave [facility] was going to be admitted for observation and have additional testing for potential Guillain-Barr.  The patient is self-pay.  The patient states her symptoms have progressed.  She now has to use a Rollator to walk.  She has extreme weakness in her lower extremities and also now in her upper arm.  She has sensory changes in her feet as well.  She has severe muscle aches in the lower extremity.  He has difficulty sleeping and low-grade temperatures to 100 degrees.  She has loose stools.  Patient also has difficulty taking a deep breath.  She states there is a rash over the back of her neck.  She did take the 50 mg prednisone given to her in the emergency room and finished this without any change in symptoms.  3/27 Was sent to ED last Ov and admitted as noted below Date of Admission: 07/08/2021                      Date of Discharge: 07/09/2021 Admitting Physician: [name], MD   Primary Care Provider: [name], MD Consultants: Neurology   Indication for Hospitalization: Weakness   Discharge Diagnoses/Problem List:  Principal  Problem:   Weakness   Disposition: Home with HH PT   Discharge Condition: Stable   Discharge Exam: From same day progress note: General: well appearing, NAD  Cardiovascular: regular rate and rhythm, no murmur appreciated.  Respiratory: clear, no wheezing or crackles  Abdomen: soft, non-tender  Extremities: no peripheral edema  Neuro: Strength 5/5 bilaterally in UE and LE. No focal deficits, no facial drooping, slurred speech, changes in vision.    Brief Hospital Course:  [name] [name] is a 36 y.o. female who presented with 10 days of weakness and myalgias. PMH is significant for moderate persistent asthma, Bipolar I disorder, chronic allergic  rhinitis, Class 3 obesity. Her hospital course is outlined below:    Weakness  Muscular Tenderness  Body Aches Patient presented with 10 days of weakness, body aches, and diffuse muscular tenderness. Initial workup with MRI of brain and spine were unremarkable with no evidence of demyelinating disease. Patient had intact reflexes and isolated muscle groups appeared to have intact strength. Presentation was less concerning for [name] Syndrome. Initial labs showed CK 33, CRP 2.0, sed rate 8, cortisol am 9.8, and no electrolyte abnormalities. Neurology began workup for rheumatologic disease, with several labs pending at the time of discharge (see below). If rheumatologic workup negative, differential includes inflammatory myositis vs fibromyalgia vs functional disorder. Neuro recommended outpatient follow up with Dr. Nita Sickle in 2-4 weeks. Patient was evaluated by physical therapy and occupational therapy who recommended outpatient PT.    Items for PCP Follow-up: Follow up with psychiatry for bipolar disorder to optimize treatment.  Follow up on autoimmune lab results.  Outpatient PT follow up.  Follow up with Neurology, Dr. 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  Her neurologic work-up otherwise was negative.  She has no autoimmune conditions on work-up.  Patient has had several rounds of physical therapy ending in January.  Patient is feeling better in this regard.  She feels like she is back to baseline.  She continues to follow-up with her mental health provider for her bipolar disorder.  She  needs a lithium level checked and her Lamictal refilled at this visit.  Patient's been using albuterol quite a bit recently.  She has a dry cough that wakes her up at night.  She denies any reflux symptoms.  She has significant rhinitis and nasal drainage.  She tends to sleep with her mouth open because her nose is obstructed.  Note patient does need a Pap smear.  Her pisq suggest that she does not want to have further children and is interested in exploring birth control measures.  Note she is self-pay.  She does have the orange card.  Because of elevated blood sugars in the hospital she would like an A1c repeated.   6/6 Patient returns complaining of episodes of diarrhea nausea and emesis.  The emesis is watery and the diarrhea has been chronic for 3 weeks.  She will go multiple times during the day.  She is trying to follow a vegan diet and she does drink occasional dairy milk.  She had extensive allergy work-up in 2021 with no evidence of food allergy seen.  She does have some intolerances to certain dairy products.  She is never been told she is lactose intolerant.  Patient recently started on Provigil per psychiatry.  She is on a 200 mg dose which is associated with diarrhea and nausea.  Asthma is stable.  She is having some allergies during the spring season. Past Medical History:  Diagnosis Date   Anxiety    Brain fog 06/12/2021   Depression    Phreesia 06/30/2020   GERD (gastroesophageal reflux disease)    History of kidney stones    Lower extremity weakness 07/08/2021   Pneumonitis 03/02/2019   Sinusitis, acute 01/01/2021   Weakness 07/08/2021    Past Surgical History:  Procedure Laterality Date   CYSTOSCOPY WITH RETROGRADE PYELOGRAM, URETEROSCOPY AND STENT PLACEMENT Right 12/10/2016   Procedure: CYSTOSCOPY WITH RETROGRADE PYELOGRAM, URETEROSCOPY AND STONE REMOVAL;  Surgeon: Heloise Purpura, MD;  Location: [facility];  Service: Urology;  Laterality: Right;   WISDOM TOOTH EXTRACTION  at age 32     Family History  Problem Relation Age of Onset   Depression Mother    Anxiety disorder Mother     Social History   Socioeconomic History   Marital status: Significant Other    Spouse name: Not on file   Number of children: Not on file   Years of education: Not on file   Highest education level: Not on file  Occupational History   Not on file  Tobacco Use   Smoking status: Never   Smokeless tobacco: Never  Vaping Use   Vaping Use: Never used  Substance and Sexual Activity   Alcohol use: Yes    Alcohol/week: 1.0 standard drink    Types: 1 Glasses of wine per week    Comment: per week   Drug use: No   Sexual activity: Yes    Birth control/protection: I.U.D.  Other Topics Concern   Not on file  Social History Narrative   Not on file   Social Determinants of Health   Financial Resource Strain: Not on file  Food Insecurity: Not on file  Transportation Needs: Not on file  Physical Activity: Not on file  Stress: Not on file  Social Connections: Not on file  Intimate Partner Violence: Not on file    Outpatient Medications Prior to Visit  Medication Sig Dispense Refill   acetaminophen (TYLENOL) 500 MG tablet Take 1,000 mg by mouth every 6 (six) hours as needed for moderate pain.     albuterol (VENTOLIN HFA) 108 (90 Base) MCG/ACT inhaler Inhale 2 puffs into the lungs every 6 (six) hours as needed for wheezing or shortness of breath. 18 g 1   ALPRAZolam (XANAX) 0.5 MG tablet Take 0.25-0.5 mg by mouth every 6 (six) hours as needed.     escitalopram (LEXAPRO) 20 MG tablet Take 1 tablet (20 mg total) by mouth daily. 60 tablet 3   fluticasone furoate-vilanterol (BREO ELLIPTA) 100-25 MCG/ACT AEPB Inhale 1 puff into the lungs daily. 60 each 11   guaiFENesin (MUCINEX PO) Take 1 capsule by mouth daily as needed (congestion).     ibuprofen (ADVIL) 200 MG tablet Take 600 mg by mouth every 6 (six) hours as needed for moderate pain.     lamoTRIgine (LAMICTAL) 200 MG tablet Take 1  tablet (200 mg total) by mouth at bedtime. 90 tablet 2   lansoprazole (PREVACID) 30 MG capsule Take 1 capsule (30 mg total) by mouth daily at 12 noon. 180 capsule 0   lithium carbonate (LITHOBID) 300 MG CR tablet Take 3 tablets by mouth once nightly at bedtime. 270 tablet 0   modafinil (PROVIGIL) 200 MG tablet Take 100 mg by mouth. 1/2 tablet daily     montelukast (SINGULAIR) 10 MG tablet Take 1 tablet (10 mg total) by mouth at bedtime. 180 tablet 0   pramipexole (MIRAPEX) 1.5 MG tablet Take 1 tablet by mouth at bedtime 90 tablet 0   Cholecalciferol (VITAMIN D3) 50 MCG (2000 UT) TABS Take 1 oral tablet once a day 30 tablet 2   pramipexole (MIRAPEX) 1.5 MG tablet Take 1  tablet by mouth at bedtime 90 tablet 0   thiamine 100 MG tablet Take 100 mg by mouth daily.     azelastine (ASTELIN) 0.1 % nasal spray Place 2 sprays into both nostrils 2 (two) times daily. Use in each nostril as directed 30 mL 12   budesonide (RHINOCORT ALLERGY) 32 MCG/ACT nasal spray Place 2 sprays into both nostrils daily. 5 mL 0   cetirizine (ZYRTEC) 10 MG tablet Take 1 tablet (10 mg total) by mouth daily. (Patient not taking: Reported on 01/07/2022) 60 tablet 6   methocarbamol (ROBAXIN) 500 MG tablet Take 1 tablet (500 mg total) by mouth every 6 (six) hours as needed for muscle spasms. 90 tablet 1   No facility-administered medications prior to visit.    Allergies  Allergen Reactions   Onion Other (See Comments)    migraines    Other     Bananas, kiwi   Wellbutrin [Bupropion] Hives    ROS Review of Systems  Constitutional:  Negative for chills, diaphoresis, fatigue and fever.  HENT:  Negative for congestion, ear discharge, ear pain, hearing loss, nosebleeds, postnasal drip, rhinorrhea, sinus pressure, sinus pain, sneezing, sore throat and tinnitus.   Eyes:  Negative for photophobia, discharge, redness and itching.  Respiratory:  Negative for cough, chest tightness, shortness of breath, wheezing and stridor.    Cardiovascular:  Negative for chest pain, palpitations and leg swelling.  Gastrointestinal:  Positive for abdominal pain, diarrhea, nausea and vomiting. Negative for blood in stool and constipation.  Endocrine: Negative for polydipsia.  Genitourinary:  Negative for dysuria, flank pain, frequency, hematuria and urgency.  Musculoskeletal:  Negative for gait problem, myalgias and neck pain.  Skin:  Negative for rash.  Allergic/Immunologic: Negative for environmental allergies.  Neurological:  Positive for headaches. Negative for dizziness, tremors, seizures and weakness.  Hematological:  Does not bruise/bleed easily.  Psychiatric/Behavioral:  Negative for suicidal ideas. The patient is not nervous/anxious.      Objective:    Physical Exam Vitals reviewed.  Constitutional:      Appearance: Normal appearance. She is well-developed. She is obese. She is not diaphoretic.  HENT:     Head: Normocephalic and atraumatic.     Nose: Rhinorrhea present. No nasal deformity, septal deviation, mucosal edema or congestion.     Right Sinus: No maxillary sinus tenderness or frontal sinus tenderness.     Left Sinus: No maxillary sinus tenderness or frontal sinus tenderness.     Mouth/Throat:     Mouth: Mucous membranes are moist.     Pharynx: Oropharynx is clear. No oropharyngeal exudate.  Eyes:     General: No scleral icterus.    Conjunctiva/sclera: Conjunctivae normal.  Pupils: Pupils are equal, round, and reactive to light.  Neck:     Thyroid: No thyromegaly.     Vascular: No carotid bruit or JVD.     Trachea: Trachea normal. No tracheal tenderness or tracheal deviation.  Cardiovascular:     Rate and Rhythm: Normal rate and regular rhythm.     Chest Wall: PMI is not displaced.     Pulses: Normal pulses. No decreased pulses.     Heart sounds: Normal heart sounds, S1 normal and S2 normal. Heart sounds not distant. No murmur heard. No systolic murmur is present.  No diastolic murmur is  present.    No friction rub. No gallop. No S3 or S4 sounds.  Pulmonary:     Effort: Pulmonary effort is normal. No tachypnea, accessory muscle usage or respiratory distress.     Breath sounds: Normal breath sounds. No stridor. No decreased breath sounds, wheezing, rhonchi or rales.  Chest:     Chest wall: No tenderness.  Abdominal:     General: Bowel sounds are normal. There is no distension.     Palpations: Abdomen is soft. Abdomen is not rigid.     Tenderness: There is abdominal tenderness. There is no guarding or rebound.  Musculoskeletal:        General: Normal range of motion.     Cervical back: Normal range of motion and neck supple. No edema, erythema or rigidity. No muscular tenderness. Normal range of motion.  Lymphadenopathy:     Head:     Right side of head: No submental or submandibular adenopathy.     Left side of head: No submental or submandibular adenopathy.     Cervical: No cervical adenopathy.  Skin:    General: Skin is warm and dry.     Coloration: Skin is not pale.     Findings: No rash.     Nails: There is no clubbing.  Neurological:     General: No focal deficit present.     Mental Status: She is alert and oriented to person, place, and time.     Cranial Nerves: Cranial nerves 2-12 are intact.     Sensory: No sensory deficit.     Motor: No weakness or abnormal muscle tone.     Gait: Gait and tandem walk normal.  Psychiatric:        Mood and Affect: Mood normal.        Speech: Speech normal.        Behavior: Behavior normal.        Thought Content: Thought content normal.        Judgment: Judgment normal.    BP 117/83   Pulse 87   Wt (!) 304 lb 6.4 oz (138.1 kg)   SpO2 99%   BMI 52.25 kg/m  Wt Readings from Last 3 Encounters:  01/07/22 (!) 304 lb 6.4 oz (138.1 kg)  12/19/21 (!) 306 lb (138.8 kg)  10/28/21 (!) 308 lb (139.7 kg)    Imaging Results (Last 48 hours)  MR THORACIC SPINE W WO CONTRAST   Result Date: 07/08/2021 CLINICAL DATA:  Motor  neuron disease EXAM: MRI THORACIC AND LUMBAR SPINE WITHOUT AND WITH CONTRAST TECHNIQUE: Multiplanar and multiecho pulse sequences of the thoracic and lumbar spine were obtained without and with intravenous contrast. CONTRAST:  36mL GADAVIST GADOBUTROL 1 MMOL/ML IV SOLN COMPARISON:  Chest CT 03/15/2019 FINDINGS: MRI THORACIC SPINE FINDINGS There is respiratory motion artifact present which mildly degrades image quality on axial sequences. Alignment:  Physiologic.  Vertebrae:  No fracture, evidence of discitis, or bone lesion. Cord: No abnormal cord signal.  No abnormal enhancement. Paraspinal and other soft tissues: Negative Disc levels: Evaluation of the disc levels are demonstrates no large disc herniation, significant spinal canal or neural foraminal narrowing. MRI LUMBAR SPINE FINDINGS Segmentation:  Standard. Alignment:  Physiologic. Vertebrae: No fracture, evidence of discitis, or aggressive bone lesion. Conus medullaris and cauda equina: Conus extends to the L1 level. Conus and cauda equina appear normal. No abnormal enhancement. Paraspinal and other soft tissues: Tiny bilateral renal cysts. Disc levels: T12-L1: No significant spinal canal or neural foraminal narrowing. L1-L2: No significant spinal canal or neural foraminal narrowing. L2-L3: No significant spinal canal or neural foraminal narrowing. L3-L4: No significant spinal canal or neural foraminal narrowing. L4-L5: No significant spinal canal or neural foraminal narrowing L5-S1: No significant spinal canal or neural foraminal narrowing. IMPRESSION: Normal MRI of the thoracic and lumbar spine. Electronically Signed   By: Maurine Simmering M.D.   On: 07/08/2021 16:45    MR Lumbar Spine W Wo Contrast   Result Date: 07/08/2021 CLINICAL DATA:  Motor neuron disease EXAM: MRI THORACIC AND LUMBAR SPINE WITHOUT AND WITH CONTRAST TECHNIQUE: Multiplanar and multiecho pulse sequences of the thoracic and lumbar spine were obtained without and with intravenous contrast.  CONTRAST:  58mL GADAVIST GADOBUTROL 1 MMOL/ML IV SOLN COMPARISON:  Chest CT 03/15/2019 FINDINGS: MRI THORACIC SPINE FINDINGS There is respiratory motion artifact present which mildly degrades image quality on axial sequences. Alignment:  Physiologic. Vertebrae:  No fracture, evidence of discitis, or bone lesion. Cord: No abnormal cord signal.  No abnormal enhancement. Paraspinal and other soft tissues: Negative Disc levels: Evaluation of the disc levels are demonstrates no large disc herniation, significant spinal canal or neural foraminal narrowing. MRI LUMBAR SPINE FINDINGS Segmentation:  Standard. Alignment:  Physiologic. Vertebrae: No fracture, evidence of discitis, or aggressive bone lesion. Conus medullaris and cauda equina: Conus extends to the L1 level. Conus and cauda equina appear normal. No abnormal enhancement. Paraspinal and other soft tissues: Tiny bilateral renal cysts. Disc levels: T12-L1: No significant spinal canal or neural foraminal narrowing. L1-L2: No significant spinal canal or neural foraminal narrowing. L2-L3: No significant spinal canal or neural foraminal narrowing. L3-L4: No significant spinal canal or neural foraminal narrowing. L4-L5: No significant spinal canal or neural foraminal narrowing L5-S1: No significant spinal canal or neural foraminal narrowing. IMPRESSION: Normal MRI of the thoracic and lumbar spine. Electronically Signed   By: Maurine Simmering M.D.   On: 07/08/2021 16:45      Health Maintenance Due  Topic Date Due   COVID-19 Vaccine (4 - Booster for Pfizer series) 08/15/2020   PAP SMEAR-Modifier  08/22/2020    There are no preventive care reminders to display for this patient.  Lab Results  Component Value Date   TSH 0.932 07/01/2021   Lab Results  Component Value Date   WBC 8.2 07/24/2021   HGB 12.5 07/24/2021   HCT 36.9 07/24/2021   MCV 87 07/24/2021   PLT 311 07/24/2021   Lab Results  Component Value Date   NA 139 07/24/2021   K 4.5 07/24/2021   CO2  25 07/24/2021   GLUCOSE 82 07/24/2021   BUN 12 07/24/2021   CREATININE 0.72 07/24/2021   BILITOT 0.3 07/24/2021   ALKPHOS 106 07/24/2021   AST 26 07/24/2021   ALT 24 07/24/2021   PROT 5.8 (L) 07/24/2021   ALBUMIN 4.2 07/24/2021   CALCIUM 9.3 07/24/2021   ANIONGAP 8 07/08/2021  EGFR 112 07/24/2021   Lab Results  Component Value Date   CHOL 175 01/24/2017   Lab Results  Component Value Date   HDL 43 01/24/2017   Lab Results  Component Value Date   LDLCALC 98 01/24/2017   Lab Results  Component Value Date   TRIG 171 (H) 01/24/2017   Lab Results  Component Value Date   CHOLHDL 4.1 01/24/2017   Lab Results  Component Value Date   HGBA1C 5.3 10/28/2021  MR Brain W and Wo Contrast   Result Date: 07/02/2021 CLINICAL DATA:  Demyelinating disease EXAM: MRI HEAD WITHOUT AND WITH CONTRAST TECHNIQUE: Multiplanar, multiecho pulse sequences of the brain and surrounding structures were obtained without and with intravenous contrast. CONTRAST:  60mL GADAVIST GADOBUTROL 1 MMOL/ML IV SOLN COMPARISON:  None. FINDINGS: Brain: No acute infarct, mass effect or extra-axial collection. No acute or chronic hemorrhage. Normal white matter signal, parenchymal volume and CSF spaces. The midline structures are normal. There is no abnormal contrast enhancement. Vascular: Major flow voids are preserved. Skull and upper cervical spine: Normal calvarium and skull base. Visualized upper cervical spine and soft tissues are normal. Sinuses/Orbits:No paranasal sinus fluid levels or advanced mucosal thickening. No mastoid or middle ear effusion. Normal orbits. IMPRESSION: Normal brain MRI. Electronically Signed   By: Ulyses Jarred M.D.   On: 07/02/2021 03:31    MR CERVICAL SPINE W WO CONTRAST   Result Date: 07/02/2021 CLINICAL DATA:  Demyelinating disease EXAM: MRI CERVICAL SPINE WITHOUT AND WITH CONTRAST TECHNIQUE: Multiplanar and multiecho pulse sequences of the cervical spine, to include the craniocervical  junction and cervicothoracic junction, were obtained without and with intravenous contrast. CONTRAST:  60mL GADAVIST GADOBUTROL 1 MMOL/ML IV SOLN COMPARISON:  None. FINDINGS: Alignment: Physiologic. Vertebrae: No fracture, evidence of discitis, or bone lesion. Cord: Normal signal and morphology. Posterior Fossa, vertebral arteries, paraspinal tissues: Negative. Disc levels: No spinal canal or neural foraminal stenosis. IMPRESSION: Normal MRI of the cervical spine. No evidence of demyelinating disease.     Assessment & Plan:   Problem List Items Addressed This Visit       Respiratory   Asthma, moderate persistent     Digestive   Diarrhea of presumed infectious origin - Primary    Plan to obtain stool for PCR pathogen and for ova and parasites and check for food allergies again with serum IgE levels.  May yet need gastroenterology referral.  Concern Provigil may be playing a role.  We will discontinue PPI and begin Pepcid       Relevant Orders   Gastrointestinal Panel by PCR , Stool   Ova and parasite examination   CBC with Differential/Platelet     Other   Food allergy   Relevant Orders   Comprehensive metabolic panel   Allergens (95) Foods IgE   Epigastric pain    As per diarrhea assessment       Relevant Orders   Lipase   CBC with Differential/Platelet   Comprehensive metabolic panel  No orders of the defined types were placed in this encounter. Plan to check hemoglobin A1c for health screening at this visit with morbid obesity  Follow-up: Return in about 2 months (around 03/09/2022).    Asencion Noble, MD

## 2022-01-07 NOTE — Assessment & Plan Note (Signed)
As per diarrhea assessment 

## 2022-01-07 NOTE — Patient Instructions (Addendum)
Stop Prevacid this may be contributing to diarrhea  Use Pepcid 20 mg twice daily for stomach acid, prescription sent to pharmacy  Do not increase Provigil higher than 100 mg daily  Stool sample be obtained for infectious disease evaluations  Complete lab screenings will be obtained  Avoid all dairy products  We may yet need to send you to gastroenterology  Use saline nasal rinse for your sinuses  No other medication changes  Return to Dr. Delford Field 2 months we will call you results

## 2022-01-07 NOTE — Assessment & Plan Note (Signed)
Plan to obtain stool for PCR pathogen and for ova and parasites and check for food allergies again with serum IgE levels.  May yet need gastroenterology referral.  Concern Provigil may be playing a role.  We will discontinue PPI and begin Pepcid

## 2022-01-07 NOTE — Telephone Encounter (Signed)
Requested Prescriptions  Pending Prescriptions Disp Refills  . albuterol (VENTOLIN HFA) 108 (90 Base) MCG/ACT inhaler 18 g 1    Sig: Inhale 2 puffs into the lungs every 6 (six) hours as needed for wheezing or shortness of breath.     Pulmonology:  Beta Agonists 2 Passed - 01/06/2022  8:20 AM      Passed - Last BP in normal range    BP Readings from Last 1 Encounters:  12/19/21 136/82         Passed - Last Heart Rate in normal range    Pulse Readings from Last 1 Encounters:  12/19/21 93         Passed - Valid encounter within last 12 months    Recent Outpatient Visits          2 weeks ago Encounter for other general counseling or advice on contraception   Primary Care at [facility], Cari S, PA-C   2 months ago Class 3 severe obesity due to excess calories without serious comorbidity with body mass index (BMI) of 45.0 to 49.9 in adult [facility])   [facility] Storm Frisk, MD   5 months ago Weakness   [facility] [place], Marylene Land M, [place]   6 months ago Weakness of both lower extremities   [facility] Storm Frisk, MD   6 months ago Acute non-recurrent sinusitis, unspecified location   [facility] Storm Frisk, MD      Future Appointments            Today Storm Frisk, MD [facility]   In 1 month Delford Field Charlcie Cradle, MD [facility]

## 2022-01-11 LAB — ALLERGENS (95) FOODS IGE
Allergen Apple, IgE: <0.1 kU/L
Allergen Banana IgE: <0.1 kU/L
Allergen Barley IgE: <0.1 kU/L
Allergen Black Pepper IgE: <0.1 kU/L
Allergen Blueberry IgE: <0.1 kU/L
Allergen Broccoli: <0.1 kU/L
Allergen Cabbage IgE: <0.1 kU/L
Allergen Carrot IgE: <0.1 kU/L
Allergen Cauliflower IgE: <0.1 kU/L
Allergen Celery IgE: <0.1 kU/L
Allergen Cinnamon IgE: <0.1 kU/L
Allergen Coconut IgE: <0.1 kU/L
Allergen Corn, IgE: <0.1 kU/L
Allergen Cucumber IgE: <0.1 kU/L
Allergen Garlic IgE: <0.1 kU/L
Allergen Ginger IgE: <0.1 kU/L
Allergen Gluten IgE: <0.1 kU/L
Allergen Grape IgE: <0.1 kU/L
Allergen Grapefruit IgE: <0.1 kU/L
Allergen Green Bean IgE: <0.1 kU/L
Allergen Green Pea IgE: <0.1 kU/L
Allergen Lamb IgE: <0.1 kU/L
Allergen Lettuce IgE: <0.1 kU/L
Allergen Lime IgE: <0.1 kU/L
Allergen Melon IgE: <0.1 kU/L
Allergen Oat IgE: <0.1 kU/L
Allergen Onion IgE: <0.1 kU/L
Allergen Pear IgE: <0.1 kU/L
Allergen Potato, White IgE: <0.1 kU/L
Allergen Rice IgE: <0.1 kU/L
Allergen Salmon IgE: <0.1 kU/L
Allergen Strawberry IgE: <0.1 kU/L
Allergen Sweet Potato IgE: <0.1 kU/L
Allergen Tomato, IgE: <0.1 kU/L
Allergen Turkey IgE: <0.1 kU/L
Allergen Watermelon IgE: <0.1 kU/L
Allergen, Peach f95: <0.1 kU/L
Basil: <0.1 kU/L
Beef IgE: <0.1 kU/L
C074-IgE Gelatin: <0.1 kU/L
Chicken IgE: <0.1 kU/L
Chocolate/Cacao IgE: <0.1 kU/L
Clam IgE: <0.1 kU/L
Codfish IgE: <0.1 kU/L
Coffee: <0.1 kU/L
Cranberry IgE: <0.1 kU/L
Egg White IgE: <0.1 kU/L
F020-IgE Almond: <0.1 kU/L
F023-IgE Crab: <0.1 kU/L
F045-IgE Yeast: <0.1 kU/L
F076-IgE Alpha Lactalbumin: <0.1 kU/L
F077-IgE Beta Lactoglobulin: <0.1 kU/L
F078-IgE Casein: <0.1 kU/L
F080-IgE Lobster: <0.1 kU/L
F081-IgE Cheese, Cheddar Type: <0.1 kU/L
F089-IgE Mustard: <0.1 kU/L
F096-IgE Avocado: <0.1 kU/L
F202-IgE Cashew Nut: <0.1 kU/L
F214-IgE Spinach: <0.1 kU/L
F222-IgE Tea: <0.1 kU/L
F242-IgE Bing Cherry: <0.1 kU/L
F261-IgE Asparagus: <0.1 kU/L
F262-IgE Eggplant: <0.1 kU/L
F265-IgE Cumin: <0.1 kU/L
F278-IgE Bayleaf (Laurel): <0.1 kU/L
F279-IgE Chili Pepper: <0.1 kU/L
F283-IgE Oregano: <0.1 kU/L
F300-IgE Goat's Milk: <0.1 kU/L
F342-IgE Olive, Black: <0.1 kU/L
F343-IgE Raspberry: <0.1 kU/L
Hops: <0.1 kU/L
IgE Egg (Yolk): <0.1 kU/L
Kidney Bean IgE: <0.1 kU/L
Lemon: <0.1 kU/L
Lima Bean IgE: <0.1 kU/L
Malt: <0.1 kU/L
Mushroom IgE: <0.1 kU/L
Orange: <0.1 kU/L
Paprika IgE: <0.1 kU/L
Peanut IgE: <0.1 kU/L
Pineapple IgE: <0.1 kU/L
Pork IgE: <0.1 kU/L
Pumpkin IgE: <0.1 kU/L
Red Beet: <0.1 kU/L
Rye IgE: <0.1 kU/L
Scallop IgE: <0.1 kU/L
Sesame Seed IgE: <0.1 kU/L
Shrimp IgE: <0.1 kU/L
Soybean IgE: <0.1 kU/L
Tuna: <0.1 kU/L
Vanilla: <0.1 kU/L
Walnut IgE: <0.1 kU/L
Wheat IgE: <0.1 kU/L
Whey: <0.1 kU/L
White Bean IgE: <0.1 kU/L

## 2022-01-11 LAB — COMPREHENSIVE METABOLIC PANEL
ALT: 13 IU/L (ref 0–32)
AST: 15 IU/L (ref 0–40)
Albumin/Globulin Ratio: 1.9 (ref 1.2–2.2)
Albumin: 4.2 g/dL (ref 3.8–4.8)
Alkaline Phosphatase: 92 IU/L (ref 44–121)
BUN/Creatinine Ratio: 14 (ref 9–23)
BUN: 10 mg/dL (ref 6–20)
Bilirubin Total: 0.3 mg/dL (ref 0.0–1.2)
CO2: 23 mmol/L (ref 20–29)
Calcium: 9.4 mg/dL (ref 8.7–10.2)
Chloride: 101 mmol/L (ref 96–106)
Creatinine, Ser: 0.73 mg/dL (ref 0.57–1.00)
Globulin, Total: 2.2 g/dL (ref 1.5–4.5)
Glucose: 90 mg/dL (ref 70–99)
Potassium: 4.7 mmol/L (ref 3.5–5.2)
Sodium: 139 mmol/L (ref 134–144)
Total Protein: 6.4 g/dL (ref 6.0–8.5)
eGFR: 110 mL/min/{1.73_m2} (ref >59)

## 2022-01-11 LAB — CBC WITH DIFFERENTIAL/PLATELET
Basophils Absolute: 0 10*3/uL (ref 0.0–0.2)
Basos: 0 %
EOS (ABSOLUTE): 0.2 10*3/uL (ref 0.0–0.4)
Eos: 2 %
Hematocrit: 36.9 % (ref 34.0–46.6)
Hemoglobin: 12.2 g/dL (ref 11.1–15.9)
Immature Grans (Abs): 0 10*3/uL (ref 0.0–0.1)
Immature Granulocytes: 0 %
Lymphocytes Absolute: 2.1 10*3/uL (ref 0.7–3.1)
Lymphs: 20 %
MCH: 28.2 pg (ref 26.6–33.0)
MCHC: 33.1 g/dL (ref 31.5–35.7)
MCV: 85 fL (ref 79–97)
Monocytes Absolute: 0.5 10*3/uL (ref 0.1–0.9)
Monocytes: 5 %
Neutrophils Absolute: 7.4 10*3/uL — ABNORMAL HIGH (ref 1.4–7.0)
Neutrophils: 73 %
Platelets: 381 10*3/uL (ref 150–450)
RBC: 4.33 x10E6/uL (ref 3.77–5.28)
RDW: 14.3 % (ref 11.7–15.4)
WBC: 10.3 10*3/uL (ref 3.4–10.8)

## 2022-01-11 LAB — LIPASE: Lipase: 18 U/L (ref 14–72)

## 2022-01-13 ENCOUNTER — Telehealth: Payer: Self-pay

## 2022-01-13 NOTE — Telephone Encounter (Signed)
-----   Message from Storm Frisk, MD sent at 01/13/2022  8:21 AM EDT ----- Let pt know all labs normal  no food allergies seen    waiting on stool studies

## 2022-01-13 NOTE — Telephone Encounter (Signed)
Pt was called and is aware of results, DOB was confirmed.  ?

## 2022-01-13 NOTE — Progress Notes (Signed)
Let pt know all labs normal  no food allergies seen    waiting on stool studies

## 2022-01-14 ENCOUNTER — Other Ambulatory Visit (HOSPITAL_COMMUNITY): Payer: Self-pay

## 2022-01-17 NOTE — Telephone Encounter (Signed)
Called pt to let her know that it has not been released on our end. I talked with Teresa(labcorpt) and it is being work on ! She verbalized understanding

## 2022-01-17 NOTE — Telephone Encounter (Signed)
Pt called to check status, wants a call back once stool study results are ready to discuss.   Best contact: [phone]

## 2022-01-20 ENCOUNTER — Telehealth: Payer: Self-pay

## 2022-01-20 LAB — OVA AND PARASITE EXAMINATION

## 2022-01-20 NOTE — Progress Notes (Signed)
Let pt know to keep GI appt when made  stool studies negative so far

## 2022-01-20 NOTE — Telephone Encounter (Signed)
Pt was called and is aware of results, DOB was confirmed.  ?

## 2022-01-26 ENCOUNTER — Encounter (HOSPITAL_BASED_OUTPATIENT_CLINIC_OR_DEPARTMENT_OTHER): Payer: Self-pay | Admitting: Critical Care Medicine

## 2022-01-26 DIAGNOSIS — K529 Noninfective gastroenteritis and colitis, unspecified: Secondary | ICD-10-CM | POA: Insufficient documentation

## 2022-01-26 DIAGNOSIS — K2951 Unspecified chronic gastritis with bleeding: Secondary | ICD-10-CM

## 2022-01-26 DIAGNOSIS — K2971 Gastritis, unspecified, with bleeding: Secondary | ICD-10-CM | POA: Insufficient documentation

## 2022-01-26 HISTORY — DX: Noninfective gastroenteritis and colitis, unspecified: K52.9

## 2022-01-26 MED ORDER — ONDANSETRON HCL 4 MG PO TABS: 4.0000 mg | ORAL_TABLET | Freq: Three times a day (TID) | ORAL | 0 refills | Status: DC | PRN

## 2022-01-26 MED ORDER — PANTOPRAZOLE SODIUM 40 MG PO TBEC: 40.0000 mg | DELAYED_RELEASE_TABLET | Freq: Every day | ORAL | 3 refills | Status: DC

## 2022-01-29 ENCOUNTER — Other Ambulatory Visit: Payer: Self-pay

## 2022-01-29 ENCOUNTER — Encounter: Payer: Self-pay | Admitting: Physician Assistant

## 2022-01-31 ENCOUNTER — Other Ambulatory Visit (HOSPITAL_COMMUNITY): Payer: Self-pay

## 2022-01-31 ENCOUNTER — Other Ambulatory Visit: Payer: Self-pay

## 2022-02-07 ENCOUNTER — Other Ambulatory Visit: Payer: Self-pay

## 2022-02-07 ENCOUNTER — Ambulatory Visit: Payer: Self-pay

## 2022-02-07 NOTE — Telephone Encounter (Signed)
Pt is calling to report swelling in the right and left foot. Pt has had swelling in feet on and feet on and off for 2 month. Pt is experiencing discomfort. In the past pt has done compression socks.     Chief Complaint: Feet and ankle swelling. States "I have mentioned this to Dr. Delford Field." Symptoms: Above Frequency: 2 months Pertinent Negatives: Patient denies redness Disposition: [] ED /[] Urgent Care (no appt availability in office) / [] Appointment(In office/virtual)/ []  [facility]/ [] Home Care/ [] Refused Recommended Disposition /[] [facility]/ [x]  Follow-up with PCP Additional Notes: Has appointment end of this month. Asking if she should be seen sooner.  Answer Assessment - Initial Assessment Questions 1. ONSET: "When did the swelling start?" (e.g., minutes, hours, days)     2 months 2. LOCATION: "What part of the leg is swollen?"  "Are both legs swollen or just one leg?"     Both feet - left one is worse 3. SEVERITY: "How bad is the swelling?" (e.g., localized; mild, moderate, severe)  - Localized - small area of swelling localized to one leg  - MILD pedal edema - swelling limited to foot and ankle, pitting edema < 1/4 inch (6 mm) deep, rest and elevation eliminate most or all swelling  - MODERATE edema - swelling of lower leg to knee, pitting edema > 1/4 inch (6 mm) deep, rest and elevation only partially reduce swelling  - SEVERE edema - swelling extends above knee, facial or hand swelling present      Mild 4. REDNESS: "Does the swelling look red or infected?"     No 5. PAIN: "Is the swelling painful to touch?" If Yes, ask: "How painful is it?"   (Scale 1-10; mild, moderate or severe)     Feels tight 6. FEVER: "Do you have a fever?" If Yes, ask: "What is it, how was it measured, and when did it start?"      No 7. CAUSE: "What do you think is causing the leg swelling?"     Unsure 8. MEDICAL HISTORY: "Do you have a history of heart failure, kidney disease,  liver failure, or cancer?"     No 9. RECURRENT SYMPTOM: "Have you had leg swelling before?" If Yes, ask: "When was the last time?" "What happened that time?"     No 10. OTHER SYMPTOMS: "Do you have any other symptoms?" (e.g., chest pain, difficulty breathing)       No 11. PREGNANCY: "Is there any chance you are pregnant?" "When was your last menstrual period?"       No  Protocols used: Leg Swelling and Edema-A-AH

## 2022-02-07 NOTE — Telephone Encounter (Signed)
Advise keep 7/27 appt , elevate leg when not walking and use compression socks

## 2022-02-10 ENCOUNTER — Other Ambulatory Visit (HOSPITAL_COMMUNITY): Payer: Self-pay

## 2022-02-10 ENCOUNTER — Other Ambulatory Visit: Payer: Self-pay

## 2022-02-10 MED ORDER — PRAMIPEXOLE DIHYDROCHLORIDE 1.5 MG PO TABS
ORAL_TABLET | ORAL | 0 refills | Status: DC
  Filled 2022-02-10: qty 90, 90d supply, fill #0

## 2022-02-10 MED ORDER — LAMOTRIGINE 200 MG PO TABS
ORAL_TABLET | ORAL | 0 refills | Status: DC
  Filled 2022-02-10: qty 90, 90d supply, fill #0

## 2022-02-10 MED ORDER — LITHIUM CARBONATE ER 300 MG PO TBCR
900.0000 mg | EXTENDED_RELEASE_TABLET | Freq: Every day | ORAL | 0 refills | Status: DC
  Filled 2022-02-10: qty 270, 90d supply, fill #0

## 2022-02-10 NOTE — Telephone Encounter (Signed)
Left message on voicemail per DPR. Encourage to call if they have additional questions or concerns.   

## 2022-02-11 ENCOUNTER — Other Ambulatory Visit: Payer: Self-pay

## 2022-02-12 ENCOUNTER — Other Ambulatory Visit (HOSPITAL_COMMUNITY): Payer: Self-pay

## 2022-02-13 ENCOUNTER — Other Ambulatory Visit (HOSPITAL_COMMUNITY): Payer: Self-pay

## 2022-02-13 ENCOUNTER — Other Ambulatory Visit: Payer: Self-pay

## 2022-02-14 ENCOUNTER — Other Ambulatory Visit: Payer: Self-pay

## 2022-02-24 NOTE — Progress Notes (Unsigned)
02/25/2022 [name] [name] [id number] [birth year]  Referring provider: Storm Frisk, MD Primary GI doctor: Dr. Christella Hartigan  ASSESSMENT AND PLAN:   Nausea and vomiting, unspecified vomiting type With GERD and epigastric pain Patient has some atopy Will schedule EGD to evaluate for possible H. pylori, esophagitis, EOE, gastritis, peptic ulcer disease, etc.. I discussed risks of EGD with patient today, including risk of sedation, bleeding or perforation.  Stop NSAIDS, increase pantoprazole to BID Get RUQ [place] to evaluate GB Could be from polypharmacy/anxiety if negative -     CBC with Differential/Platelet; Future -     Comprehensive metabolic panel; Future -     IgA; Future -     Tissue transglutaminase, IgA; Future -     High sensitivity CRP; Future -     Sedimentation rate; Future -     H. pylori antibody, IgG; Future -     US ABDOMEN LIMITED RUQ (LIVER/GB); Future  Diarrhea, unspecified type with AB pain x 3 months The differential diagnosis of chronic diarrhea without alarm features includes: irritable bowel syndrome, IBD, celiac disease, food intolerance, microscopic colitis, thyroid disorder, other functional GI disease.  FODMAP given - EGD with duodenal biopsies - Colonoscopy with random biopsies and evaluation of the terminal ileum -     CBC with Differential/Platelet; Future -     Comprehensive metabolic panel; Future -     IgA; Future -     Tissue transglutaminase, IgA; Future -     High sensitivity CRP; Future -     Sedimentation rate; Future -     H. pylori antibody, IgG; Future -     0.9 %  sodium chloride infusion -     Ambulatory referral to Gastroenterology  Morbid obesity  Body mass index is 51.32 kg/m.  -Patient has been advised to make an attempt to improve diet and exercise patterns to aid in weight loss. -Recommended diet heavy in fruits and veggies and low in animal meats, cheeses, and dairy products, appropriate calorie intake Patient will be  scheduled at the hospital for their procedure due to ASA IV criteria: BMI over 50     Patient Care Team: Storm Frisk, MD as PCP - General (Pulmonary Disease)  HISTORY OF PRESENT ILLNESS: 36 y.o. female with a past medical history of food allergy, atopic dermatitis, morbid obesity and others listed below presents for evaluation of diarrhea.  Negative ova and parasite 01/09/2022, normal lipase, lithium level within normal range White blood cell count 10.3 hemoglobin 12.2, platelets 381 Admission in November for lower extremity weakness, CRP 2.0, sed rate 8, normal cortisol and CPK.  Rheumatological work-up negative.  Following with neurology. 01/26/2022 patient had nausea with vomiting had bright red streaks.  She has been having diarrhea for at least 3 months.  Can have BM up to 5 days a day, at least 2 x a day.  Has urgency with it, with occasional feeling that she has to go but no stool.  She can have very bad cramping before BM, some improvement with BM.  Has had 1-2 episodes of nocturnal diarrhea that will wake her up, no fecal incontinence.  No hemtochezia, no melena unless she takes pepto. Can have mucus in it at time.  Started on pantoprazole and states had some constipation after started, lasted for a day then had more formed stools that was still difficult to have BM.  She has been having epigastric pain with nausea, can be worse in the morning,  since starting on pantoprazole has improved to not every day.  She can have vomiting with it.  Had a few days with specks of blood, occ yellow bile in vomit.  She has asthma, would wake up coughing, started on pantoprazole, has had some improvement.  Rare dysphagia, intermittent, can feel like it goes down the wrong pipe too.  She has ondansetron and states she vomits with it.   She has been taking sulendac in the morning when she wakes up first thing with all her medications. Has nausea prior to that.  Was on a lot of advil/aleve  prior that that.  No ETOH, drug use/mariajuana.  She has eczema/asthma/allergies.  No GI malignancy, no autoimmune she knows of, estranged from family.  The patient has not had any extraintestinal symptoms She is vegan. She is on B12   Current Medications:     Current Outpatient Medications (Respiratory):    albuterol (VENTOLIN HFA) 108 (90 Base) MCG/ACT inhaler, Inhale 2 puffs into the lungs every 6 (six) hours as needed for wheezing or shortness of breath.   fluticasone furoate-vilanterol (BREO ELLIPTA) 100-25 MCG/ACT AEPB, Inhale 1 puff into the lungs daily.   guaiFENesin (MUCINEX PO), Take 1 capsule by mouth daily as needed (congestion).   montelukast (SINGULAIR) 10 MG tablet, Take 1 tablet (10 mg total) by mouth at bedtime.  Current Outpatient Medications (Analgesics):    acetaminophen (TYLENOL) 500 MG tablet, Take 1,000 mg by mouth every 6 (six) hours as needed for moderate pain.   ibuprofen (ADVIL) 200 MG tablet, Take 600 mg by mouth every 6 (six) hours as needed for moderate pain.   Current Outpatient Medications (Other):    ALPRAZolam (XANAX) 0.5 MG tablet, Take 0.25-0.5 mg by mouth every 6 (six) hours as needed.   escitalopram (LEXAPRO) 20 MG tablet, Take 1 tablet (20 mg total) by mouth daily.   lamoTRIgine (LAMICTAL) 200 MG tablet, Take 1 tablet (200 mg total) by mouth at bedtime.   lamoTRIgine (LAMICTAL) 200 MG tablet, Take 1 tablet  by mouth at bedtime (if out of lamictal more than 1 wk do not restart, call MD)   lithium carbonate (LITHOBID) 300 MG CR tablet, Take 3 tablets by mouth once nightly at bedtime.   lithium carbonate (LITHOBID) 300 MG CR tablet, Take 3 tablets by mouth at bedtime   modafinil (PROVIGIL) 200 MG tablet, Take 100 mg by mouth. 1/2 tablet daily   ondansetron (ZOFRAN) 4 MG tablet, Take 1 tablet (4 mg total) by mouth every 8 (eight) hours as needed for nausea or vomiting.   pantoprazole (PROTONIX) 40 MG tablet, Take 1 tablet (40 mg total) by mouth  daily.   pramipexole (MIRAPEX) 1.5 MG tablet, Take 1 tablet by mouth at bedtime   pramipexole (MIRAPEX) 1.5 MG tablet, Take 1 tablet by mouth  at bedtime  Medical History:  Past Medical History:  Diagnosis Date   Anxiety    Asthma    Brain fog 06/12/2021   Depression    Phreesia 06/30/2020   GERD (gastroesophageal reflux disease)    History of kidney stones    IBS (irritable bowel syndrome)    Kidney stone    Lower extremity weakness 07/08/2021   Obesity    Pneumonitis 03/02/2019   Sinusitis, acute 01/01/2021   Weakness 07/08/2021   Allergies:  Allergies  Allergen Reactions   Onion Other (See Comments)    migraines    Other     Bananas, kiwi   Wellbutrin [Bupropion] Hives  Surgical History:  She  has a past surgical history that includes Wisdom tooth extraction (at age 66) and Cystoscopy with retrograde pyelogram, ureteroscopy and stent placement (Right, 12/10/2016). Family History:  Her family history includes Anxiety disorder in her mother; Depression in her mother. Social History:   reports that she has never smoked. She has never used smokeless tobacco. She reports that she does not drink alcohol and does not use drugs.  REVIEW OF SYSTEMS  : All other systems reviewed and negative except where noted in the History of Present Illness.   PHYSICAL EXAM: BP 118/89   Pulse 93   Ht 5\' 4"  (1.626 m)   Wt 299 lb (135.6 kg)   BMI 51.32 kg/m  General:   Pleasant, well developed female in no acute distress Head:   Normocephalic and atraumatic. Eyes:  sclerae anicteric,conjunctive pink  Heart:   regular rate and rhythm, possible S2 versus systolic murmur Pulm:  Clear anteriorly; no wheezing Abdomen:   Soft, Obese AB, Active bowel sounds. moderate tenderness in the epigastrium, in the RUQ, and in the RLQ. With guarding and Without rebound, Negative murphy's No organomegaly appreciated. Rectal: Not evaluated Extremities:  With edema. Msk: Symmetrical without gross  deformities. Peripheral pulses intact.  Neurologic:  Alert and  oriented x4;  No focal deficits.  Skin:   Dry and intact without significant lesions or rashes. Psychiatric:  Cooperative. Anxious mood and normal affect.    , PA-C 9:42 AM

## 2022-02-25 ENCOUNTER — Other Ambulatory Visit (INDEPENDENT_AMBULATORY_CARE_PROVIDER_SITE_OTHER): Payer: Self-pay

## 2022-02-25 ENCOUNTER — Encounter: Payer: Self-pay | Admitting: Physician Assistant

## 2022-02-25 ENCOUNTER — Ambulatory Visit (INDEPENDENT_AMBULATORY_CARE_PROVIDER_SITE_OTHER): Payer: Self-pay | Admitting: Physician Assistant

## 2022-02-25 VITALS — BP 118/89 | HR 93 | Ht 64.0 in | Wt 299.0 lb

## 2022-02-25 DIAGNOSIS — R1013 Epigastric pain: Secondary | ICD-10-CM

## 2022-02-25 DIAGNOSIS — K219 Gastro-esophageal reflux disease without esophagitis: Secondary | ICD-10-CM

## 2022-02-25 DIAGNOSIS — R112 Nausea with vomiting, unspecified: Secondary | ICD-10-CM

## 2022-02-25 DIAGNOSIS — R197 Diarrhea, unspecified: Secondary | ICD-10-CM

## 2022-02-25 DIAGNOSIS — Z6841 Body Mass Index (BMI) 40.0 and over, adult: Secondary | ICD-10-CM

## 2022-02-25 LAB — CBC WITH DIFFERENTIAL/PLATELET
Basophils Absolute: 0 10*3/uL (ref 0.0–0.1)
Basophils Relative: 0.5 % (ref 0.0–3.0)
Eosinophils Absolute: 0.1 10*3/uL (ref 0.0–0.7)
Eosinophils Relative: 1.9 % (ref 0.0–5.0)
HCT: 38.8 % (ref 36.0–46.0)
Hemoglobin: 12.7 g/dL (ref 12.0–15.0)
Lymphocytes Relative: 25.8 % (ref 12.0–46.0)
Lymphs Abs: 1.9 10*3/uL (ref 0.7–4.0)
MCHC: 32.8 g/dL (ref 30.0–36.0)
MCV: 85.7 fl (ref 78.0–100.0)
Monocytes Absolute: 0.6 10*3/uL (ref 0.1–1.0)
Monocytes Relative: 7.8 % (ref 3.0–12.0)
Neutro Abs: 4.8 10*3/uL (ref 1.4–7.7)
Neutrophils Relative %: 64 % (ref 43.0–77.0)
Platelets: 335 10*3/uL (ref 150.0–400.0)
RBC: 4.53 Mil/uL (ref 3.87–5.11)
RDW: 15.4 % (ref 11.5–15.5)
WBC: 7.5 10*3/uL (ref 4.0–10.5)

## 2022-02-25 LAB — COMPREHENSIVE METABOLIC PANEL
ALT: 11 U/L (ref 0–35)
AST: 12 U/L (ref 0–37)
Albumin: 4.4 g/dL (ref 3.5–5.2)
Alkaline Phosphatase: 81 U/L (ref 39–117)
BUN: 10 mg/dL (ref 6–23)
CO2: 27 mEq/L (ref 19–32)
Calcium: 9.7 mg/dL (ref 8.4–10.5)
Chloride: 104 mEq/L (ref 96–112)
Creatinine, Ser: 0.81 mg/dL (ref 0.40–1.20)
GFR: 93.61 mL/min (ref >60.00)
Glucose, Bld: 99 mg/dL (ref 70–99)
Potassium: 4.2 mEq/L (ref 3.5–5.1)
Sodium: 138 mEq/L (ref 135–145)
Total Bilirubin: 0.4 mg/dL (ref 0.2–1.2)
Total Protein: 6.6 g/dL (ref 6.0–8.3)

## 2022-02-25 LAB — SEDIMENTATION RATE: Sed Rate: 15 mm/hr (ref 0–20)

## 2022-02-25 LAB — HIGH SENSITIVITY CRP: CRP, High Sensitivity: 5.96 mg/L — ABNORMAL HIGH (ref 0.000–5.000)

## 2022-02-25 LAB — H. PYLORI ANTIBODY, IGG: H Pylori IgG: NEGATIVE

## 2022-02-25 NOTE — Progress Notes (Signed)
I agree with the above note, plan 

## 2022-02-25 NOTE — Patient Instructions (Addendum)
If you are age 36 or older, your body mass index should be between 23-30. Your Body mass index is 51.32 kg/m. If this is out of the aforementioned range listed, please consider follow up with your Primary Care Provider.  If you are age 62 or younger, your body mass index should be between 19-25. Your Body mass index is 51.32 kg/m. If this is out of the aformentioned range listed, please consider follow up with your Primary Care Provider.   ________________________________________________________  The [place] [facility] providers would like to encourage you to use [facility] to communicate with providers for non-urgent requests or questions.  Due to long hold times on the telephone, sending your provider a message by [facility] may be a faster and more efficient way to get a response.  Please allow 48 business hours for a response.  Please remember that this is for non-urgent requests.  _______________________________________________________  Your provider has requested that you go to the basement level for lab work before leaving today. Press "B" on the elevator. The lab is located at the first door on the left as you exit the elevator.  You have been scheduled for an abdominal ultrasound at [facility] (1st floor of hospital) on  03-07-2022 at   9am  . Please arrive 30 minutes prior to your appointment for registration. Make certain not to have anything to eat or drink midnight prior to your appointment. Should you need to reschedule your appointment, please contact radiology at [phone]. This test typically takes about 30 minutes to perform.  It has been recommended to you by your physician that you have a(n) EGD/Colon at [facility] with Dr Christella Hartigan completed. Please contact our office at [phone] in 1 month if you haven't heard from our office regarding a hospital date.  Please take your proton pump inhibitor medication, INCREASE TO BID FOR NOW X 1 MONTHS Please take this medication  30 minutes to 1 hour before meals- this makes it more effective.   Avoid spicy and acidic foods Avoid fatty foods Limit your intake of coffee, tea, alcohol, and carbonated drinks  Work to maintain a healthy weight  Keep the head of the bed elevated at least 3 inches with blocks or a wedge pillow if you are having any nighttime symptoms Stay upright for 2 hours after eating Avoid meals and snacks three to four hours before bedtime  STOP THE SULENDAC, NO ALEVE, IBUPROFEN No aleve, ibuprofen, goody powders, as these are antiinflammatories and can cause inflammation in your stomach, increase bleeding risk and cause ulcers.   You can talk with PCP about alternative pain options.   Can do tyelnol max 3000 mg a day, salon pas patches are over the counter and voltern gel is topical antiinflammatory that is safe.   Please try low FODMAP diet- see below- start with just one column at a time.  Add fiber like benefiber or citracel once a day Increase activity Can do trial of IBGard for AB pain- Take 1-2 capsules once a day for maintence or twice a day during a flare Will also send in an anti spasm medication to take as needed  Please try to decrease stress. consider talking with PCP about anti anxiety medication or try head space app for meditation. if any worsening symptoms like blood in stool, weight loss, please call the office or go to the ER.     FODMAP stands for fermentable oligo-, di-, mono-saccharides and polyols (1). These are the scientific terms used  to classify groups of carbs that are notorious for triggering digestive symptoms like bloating, gas and stomach pain.

## 2022-02-26 LAB — IGA: Immunoglobulin A: 45 mg/dL — ABNORMAL LOW (ref 47–310)

## 2022-02-26 LAB — TISSUE TRANSGLUTAMINASE, IGA: (tTG) Ab, IgA: <1 U/mL

## 2022-02-27 ENCOUNTER — Ambulatory Visit: Payer: Self-pay | Attending: Critical Care Medicine | Admitting: Critical Care Medicine

## 2022-02-27 ENCOUNTER — Encounter: Payer: Self-pay | Admitting: Critical Care Medicine

## 2022-02-27 ENCOUNTER — Other Ambulatory Visit: Payer: Self-pay

## 2022-02-27 VITALS — BP 125/81 | HR 95 | Ht 64.0 in | Wt 299.2 lb

## 2022-02-27 DIAGNOSIS — R1013 Epigastric pain: Secondary | ICD-10-CM

## 2022-02-27 DIAGNOSIS — R600 Localized edema: Secondary | ICD-10-CM | POA: Insufficient documentation

## 2022-02-27 DIAGNOSIS — G4700 Insomnia, unspecified: Secondary | ICD-10-CM

## 2022-02-27 DIAGNOSIS — R197 Diarrhea, unspecified: Secondary | ICD-10-CM

## 2022-02-27 DIAGNOSIS — G479 Sleep disorder, unspecified: Secondary | ICD-10-CM | POA: Insufficient documentation

## 2022-02-27 DIAGNOSIS — K529 Noninfective gastroenteritis and colitis, unspecified: Secondary | ICD-10-CM

## 2022-02-27 DIAGNOSIS — J4540 Moderate persistent asthma, uncomplicated: Secondary | ICD-10-CM

## 2022-02-27 DIAGNOSIS — K2951 Unspecified chronic gastritis with bleeding: Secondary | ICD-10-CM

## 2022-02-27 NOTE — Assessment & Plan Note (Signed)
Patient has follow-up with GI plans to have upper endoscopy and colonoscopy continue with Protonix for now

## 2022-02-27 NOTE — Progress Notes (Signed)
Has been having swelling in feet. Discuss sleep study.

## 2022-02-27 NOTE — Assessment & Plan Note (Signed)
All diarrhea studies so far negative for infectious disease origin

## 2022-02-27 NOTE — Assessment & Plan Note (Signed)
Chronic diarrhea persist work-up per GI

## 2022-02-27 NOTE — Assessment & Plan Note (Signed)
Continue with inhaled medications 

## 2022-02-27 NOTE — Progress Notes (Signed)
Established Patient Office Visit  Subjective:  Patient ID: [name] [name], female    DOB: [birth year]  Age: 35 y.o. MRN: [id number]  CC:  Chief Complaint  Patient presents with   Foot Swelling    HPI 07/08/21 [name] A Kun presents for post emergency room follow-up.  Patient was in the emergency room between the 28th and 29 November with lower extremity weakness.  Below is documentation from that emergency room visit  36 yo female presets with lower extremity weakness.  Patient has had URI symptoms and was treated for sinusitis with Augmentin.  [name] [name] felt better from the ninth of the 19th when she was on the Augmentin.  She was off of it over the weekend and had some fevers.  She has now noted difficulty walking.  She feels that she is weak and she has to shuffle.  She has difficulty standing up from a seated position.  She has also had cough with this.  She has a history of asthma.  She has had some difficulty breathing and has had to have her asthma medications adjusted.  She does not feel that she is having trouble with her feet or ankles.  She denies any numbness.  Patient was initially seen and evaluated at urgent care which had a chest x-ray done, with minimal basilar atelectasis, normal sed rate, normal CBC and negative POC flu and mono testing.  She is then seen here and screened.  MR of the head and neck were ordered.  Neurology was consulted via phone.  She has not had her MRI and is to be reevaluated. 36 year old female with recent febrile illness, also recently on antibiotics who presents today with proximal leg weakness difficulty walking.   Patient with lower extremity weakness.  Concern for [name] discussed with Dr. Cheral Marker.  Plan admission to medicine for observation and mr of thoracic and lumbar spine. Patient now wishes to leave.  I have discussed risks with patient including respiratory distress and arrest.  We have discussed return precautions.  We discussed that  she can return at any time if she is feeling worse.  They do live within the city and her husband will be with her. Pt left AMA  needs Thoracic /lumbar MRI  The patient did leave [place] was going to be admitted for observation and have additional testing for potential Guillain-Barr.  The patient is self-pay.  The patient states her symptoms have progressed.  She now has to use a Rollator to walk.  She has extreme weakness in her lower extremities and also now in her upper arm.  She has sensory changes in her feet as well.  She has severe muscle aches in the lower extremity.  He has difficulty sleeping and low-grade temperatures to 100 degrees.  She has loose stools.  Patient also has difficulty taking a deep breath.  She states there is a rash over the back of her neck.  She did take the 50 mg prednisone given to her in the emergency room and finished this without any change in symptoms.  3/27 Was sent to ED last Ov and admitted as noted below Date of Admission: 07/08/2021                      Date of Discharge: 07/09/2021 Admitting Physician: [name] [name]   Primary Care Provider: [name], [name] Consultants: Neurology   Indication for Hospitalization: Weakness   Discharge Diagnoses/Problem List:  Principal  Problem:   Weakness   Disposition: Home with HH PT   Discharge Condition: Stable   Discharge Exam: From same day progress note: General: well appearing, NAD  Cardiovascular: regular rate and rhythm, no murmur appreciated.  Respiratory: clear, no wheezing or crackles  Abdomen: soft, non-tender  Extremities: no peripheral edema  Neuro: Strength 5/5 bilaterally in UE and LE. No focal deficits, no facial drooping, slurred speech, changes in vision.    Brief Hospital Course:  [name] [name] is a 36 y.o. female who presented with 10 days of weakness and myalgias. PMH is significant for moderate persistent asthma, Bipolar I disorder, chronic allergic  rhinitis, Class 3 obesity. Her hospital course is outlined below:    Weakness  Muscular Tenderness  Body Aches Patient presented with 10 days of weakness, body aches, and diffuse muscular tenderness. Initial workup with MRI of brain and spine were unremarkable with no evidence of demyelinating disease. Patient had intact reflexes and isolated muscle groups appeared to have intact strength. Presentation was less concerning for [name] Syndrome. Initial labs showed CK 33, CRP 2.0, sed rate 8, cortisol am 9.8, and no electrolyte abnormalities. Neurology began workup for rheumatologic disease, with several labs pending at the time of discharge (see below). If rheumatologic workup negative, differential includes inflammatory myositis vs fibromyalgia vs functional disorder. Neuro recommended outpatient follow up with Dr. Narda [name] in 2-4 weeks. Patient was evaluated by physical therapy and occupational therapy who recommended outpatient PT.    Items for PCP Follow-up: Follow up with psychiatry for bipolar disorder to optimize treatment.  Follow up on autoimmune lab results.  Outpatient PT follow up.  Follow up with Neurology, Dr. 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  Her neurologic work-up otherwise was negative.  She has no autoimmune conditions on work-up.  Patient has had several rounds of physical therapy ending in January.  Patient is feeling better in this regard.  She feels like she is back to baseline.  She continues to follow-up with her mental health provider for her bipolar disorder.  She  needs a lithium level checked and her Lamictal refilled at this visit.  Patient's been using albuterol quite a bit recently.  She has a dry cough that wakes her up at night.  She denies any reflux symptoms.  She has significant rhinitis and nasal drainage.  She tends to sleep with her mouth open because her nose is obstructed.  Note patient does need a Pap smear.  Her pisq suggest that she does not want to have further children and is interested in exploring birth control measures.  Note she is self-pay.  She does have the orange card.  Because of elevated blood sugars in the hospital she would like an A1c repeated.   6/6 Patient returns complaining of episodes of diarrhea nausea and emesis.  The emesis is watery and the diarrhea has been chronic for 3 weeks.  She will go multiple times during the day.  She is trying to follow a vegan diet and she does drink occasional dairy milk.  She had extensive allergy work-up in 2021 with no evidence of food allergy seen.  She does have some intolerances to certain dairy products.  She is never been told she is lactose intolerant.  Patient recently started on Provigil per psychiatry.  She is on a 200 mg dose which is associated with diarrhea and nausea.  Asthma is stable.  She is having some allergies during the spring season.  7/27 Patient seen in return follow-up she has had a slight cough for 2 days some lower extremity edema she is a vegan and does eat processed vegetarian foods.  Patient is requesting a sleep study as well.  Note she has a gynecology appointment on August 21 for Pap smear.  She is reapplying for the orange card.  She has follow-up appoint with gastroenterology for evaluation of her diarrhea and chronic abdominal pain.  She is seeing GI already lab studies have been unrevealing.  She will need a both an upper and endoscopy and colonoscopy.  Asthma has been stable during the heat of the summer.  She still has loose stools at this time.  She is  taking Protonix twice daily now.  Past Medical History:  Diagnosis Date   Anxiety    Asthma    Brain fog 06/12/2021   Depression    Phreesia 06/30/2020   GERD (gastroesophageal reflux disease)    History of kidney stones    IBS (irritable bowel syndrome)    Kidney stone    Lower extremity weakness 07/08/2021   Obesity    Pneumonitis 03/02/2019   Sinusitis, acute 01/01/2021   Weakness 07/08/2021    Past Surgical History:  Procedure Laterality Date   CYSTOSCOPY WITH RETROGRADE PYELOGRAM, URETEROSCOPY AND STENT PLACEMENT Right 12/10/2016   Procedure: CYSTOSCOPY WITH RETROGRADE PYELOGRAM, URETEROSCOPY AND STONE REMOVAL;  Surgeon: Raynelle Bring, [name];  Location: [facility];  Service: Urology;  Laterality: Right;   WISDOM TOOTH EXTRACTION  at age 76    Family History  Problem Relation Age of Onset   Depression Mother    Anxiety disorder Mother    Stomach cancer Neg Hx    Colon cancer Neg Hx    Esophageal cancer Neg Hx     Social History   Socioeconomic History   Marital status: Significant Other    Spouse name: Not on file   Number of children: 0   Years of education: Not on file   Highest education level: Not on file  Occupational History   Occupation: artist  Tobacco Use   Smoking status: Never   Smokeless tobacco: Never  Vaping Use   Vaping Use: Never used  Substance and Sexual Activity   Alcohol use: Never    Alcohol/week: 1.0 standard drink of alcohol    Types: 1 Glasses of wine per week    Comment: per week   Drug use: No   Sexual activity: Yes    Birth control/protection: I.U.D.  Other Topics Concern   Not on file  Social History Narrative   Not on file   Social Determinants of Health   Financial Resource Strain: Not on file  Food Insecurity: Not on file  Transportation Needs: Not on file  Physical Activity: Not on file  Stress: Not on file  Social Connections: Not on file  Intimate Partner Violence: Not on file    Outpatient Medications Prior  to  Visit  Medication Sig Dispense Refill   acetaminophen (TYLENOL) 500 MG tablet Take 1,000 mg by mouth every 6 (six) hours as needed for moderate pain.     albuterol (VENTOLIN HFA) 108 (90 Base) MCG/ACT inhaler Inhale 2 puffs into the lungs every 6 (six) hours as needed for wheezing or shortness of breath. 18 g 1   ALPRAZolam (XANAX) 0.5 MG tablet Take 0.25-0.5 mg by mouth every 6 (six) hours as needed.     escitalopram (LEXAPRO) 20 MG tablet Take 1 tablet (20 mg total) by mouth daily. 60 tablet 3   fluticasone furoate-vilanterol (BREO ELLIPTA) 100-25 MCG/ACT AEPB Inhale 1 puff into the lungs daily. 60 each 11   guaiFENesin (MUCINEX PO) Take 1 capsule by mouth daily as needed (congestion).     lamoTRIgine (LAMICTAL) 200 MG tablet Take 1 tablet (200 mg total) by mouth at bedtime. 90 tablet 2   lithium carbonate (LITHOBID) 300 MG CR tablet Take 3 tablets by mouth once nightly at bedtime. 270 tablet 0   modafinil (PROVIGIL) 200 MG tablet Take 200 mg by mouth. 1/2 tablet daily     montelukast (SINGULAIR) 10 MG tablet Take 1 tablet (10 mg total) by mouth at bedtime. 180 tablet 0   pantoprazole (PROTONIX) 40 MG tablet Take 1 tablet (40 mg total) by mouth daily. (Patient taking differently: Take 40 mg by mouth 2 (two) times daily before a meal.) 30 tablet 3   pramipexole (MIRAPEX) 1.5 MG tablet Take 1 tablet by mouth at bedtime 90 tablet 0   ibuprofen (ADVIL) 200 MG tablet Take 600 mg by mouth every 6 (six) hours as needed for moderate pain.     lamoTRIgine (LAMICTAL) 200 MG tablet Take 1 tablet  by mouth at bedtime (if out of lamictal more than 1 wk do not restart, call [name]) 90 tablet 0   lithium carbonate (LITHOBID) 300 MG CR tablet Take 3 tablets by mouth at bedtime 270 tablet 0   ondansetron (ZOFRAN) 4 MG tablet Take 1 tablet (4 mg total) by mouth every 8 (eight) hours as needed for nausea or vomiting. 20 tablet 0   pramipexole (MIRAPEX) 1.5 MG tablet Take 1 tablet by mouth  at bedtime 90 tablet 0   No  facility-administered medications prior to visit.    Allergies  Allergen Reactions   Onion Other (See Comments)    migraines    Other     Bananas, kiwi   Wellbutrin [Bupropion] Hives    ROS Review of Systems  Constitutional:  Negative for chills, diaphoresis, fatigue and fever.  HENT:  Negative for congestion, ear discharge, ear pain, hearing loss, nosebleeds, postnasal drip, rhinorrhea, sinus pressure, sinus pain, sneezing, sore throat and tinnitus.   Eyes:  Negative for photophobia, discharge, redness and itching.  Respiratory:  Negative for cough, chest tightness, shortness of breath, wheezing and stridor.   Cardiovascular:  Positive for leg swelling. Negative for chest pain and palpitations.  Gastrointestinal:  Positive for abdominal pain, diarrhea and nausea. Negative for blood in stool, constipation and vomiting.       No heartburn  Endocrine: Negative for polydipsia.  Genitourinary:  Negative for dysuria, flank pain, frequency, hematuria and urgency.  Musculoskeletal:  Negative for gait problem, myalgias and neck pain.  Skin:  Negative for rash.  Allergic/Immunologic: Negative for environmental allergies.  Neurological:  Positive for headaches. Negative for dizziness, tremors, seizures and weakness.  Hematological:  Does not bruise/bleed easily.  Psychiatric/Behavioral:  Negative for suicidal ideas. The  patient is not nervous/anxious.       Objective:    Physical Exam Vitals reviewed.  Constitutional:      Appearance: Normal appearance. She is well-developed. She is obese. She is not diaphoretic.  HENT:     Head: Normocephalic and atraumatic.     Nose: Rhinorrhea present. No nasal deformity, septal deviation, mucosal edema or congestion.     Right Sinus: No maxillary sinus tenderness or frontal sinus tenderness.     Left Sinus: No maxillary sinus tenderness or frontal sinus tenderness.     Mouth/Throat:     Mouth: Mucous membranes are moist.     Pharynx: Oropharynx  is clear. No oropharyngeal exudate.  Eyes:     General: No scleral icterus.    Conjunctiva/sclera: Conjunctivae normal.     Pupils: Pupils are equal, round, and reactive to light.  Neck:     Thyroid: No thyromegaly.     Vascular: No carotid bruit or JVD.     Trachea: Trachea normal. No tracheal tenderness or tracheal deviation.  Cardiovascular:     Rate and Rhythm: Normal rate and regular rhythm.     Chest Wall: PMI is not displaced.     Pulses: Normal pulses. No decreased pulses.     Heart sounds: Normal heart sounds, S1 normal and S2 normal. Heart sounds not distant. No murmur heard.    No systolic murmur is present.     No diastolic murmur is present.     No friction rub. No gallop. No S3 or S4 sounds.  Pulmonary:     Effort: Pulmonary effort is normal. No tachypnea, accessory muscle usage or respiratory distress.     Breath sounds: Normal breath sounds. No stridor. No decreased breath sounds, wheezing, rhonchi or rales.  Chest:     Chest wall: No tenderness.  Abdominal:     General: Bowel sounds are normal. There is no distension.     Palpations: Abdomen is soft. Abdomen is not rigid.     Tenderness: There is abdominal tenderness. There is no guarding or rebound.  Musculoskeletal:        General: Normal range of motion.     Cervical back: Normal range of motion and neck supple. No edema, erythema or rigidity. No muscular tenderness. Normal range of motion.  Lymphadenopathy:     Head:     Right side of head: No submental or submandibular adenopathy.     Left side of head: No submental or submandibular adenopathy.     Cervical: No cervical adenopathy.  Skin:    General: Skin is warm and dry.     Coloration: Skin is not pale.     Findings: No rash.     Nails: There is no clubbing.  Neurological:     General: No focal deficit present.     Mental Status: She is alert and oriented to person, place, and time.     Cranial Nerves: Cranial nerves 2-12 are intact.     Sensory: No  sensory deficit.     Motor: No weakness or abnormal muscle tone.     Gait: Gait and tandem walk normal.  Psychiatric:        Mood and Affect: Mood normal.        Speech: Speech normal.        Behavior: Behavior normal.        Thought Content: Thought content normal.        Judgment: Judgment normal.     BP  125/81   Pulse 95   Ht $R'5\' 4"'jm$  (1.626 m)   Wt 299 lb 3.2 oz (135.7 kg)   SpO2 100%   BMI 51.36 kg/m  Wt Readings from Last 3 Encounters:  02/27/22 299 lb 3.2 oz (135.7 kg)  02/25/22 299 lb (135.6 kg)  01/07/22 (!) 304 lb 6.4 oz (138.1 kg)    Imaging Results (Last 48 hours)  MR THORACIC SPINE W WO CONTRAST   Result Date: 07/08/2021 CLINICAL DATA:  Motor neuron disease EXAM: MRI THORACIC AND LUMBAR SPINE WITHOUT AND WITH CONTRAST TECHNIQUE: Multiplanar and multiecho pulse sequences of the thoracic and lumbar spine were obtained without and with intravenous contrast. CONTRAST:  41mL GADAVIST GADOBUTROL 1 MMOL/ML IV SOLN COMPARISON:  Chest CT 03/15/2019 FINDINGS: MRI THORACIC SPINE FINDINGS There is respiratory motion artifact present which mildly degrades image quality on axial sequences. Alignment:  Physiologic. Vertebrae:  No fracture, evidence of discitis, or bone lesion. Cord: No abnormal cord signal.  No abnormal enhancement. Paraspinal and other soft tissues: Negative Disc levels: Evaluation of the disc levels are demonstrates no large disc herniation, significant spinal canal or neural foraminal narrowing. MRI LUMBAR SPINE FINDINGS Segmentation:  Standard. Alignment:  Physiologic. Vertebrae: No fracture, evidence of discitis, or aggressive bone lesion. Conus medullaris and cauda equina: Conus extends to the L1 level. Conus and cauda equina appear normal. No abnormal enhancement. Paraspinal and other soft tissues: Tiny bilateral renal cysts. Disc levels: T12-L1: No significant spinal canal or neural foraminal narrowing. L1-L2: No significant spinal canal or neural foraminal narrowing.  L2-L3: No significant spinal canal or neural foraminal narrowing. L3-L4: No significant spinal canal or neural foraminal narrowing. L4-L5: No significant spinal canal or neural foraminal narrowing L5-S1: No significant spinal canal or neural foraminal narrowing. IMPRESSION: Normal MRI of the thoracic and lumbar spine. Electronically Signed   By: Maurine Simmering M.D.   On: 07/08/2021 16:45    MR Lumbar Spine W Wo Contrast   Result Date: 07/08/2021 CLINICAL DATA:  Motor neuron disease EXAM: MRI THORACIC AND LUMBAR SPINE WITHOUT AND WITH CONTRAST TECHNIQUE: Multiplanar and multiecho pulse sequences of the thoracic and lumbar spine were obtained without and with intravenous contrast. CONTRAST:  78mL GADAVIST GADOBUTROL 1 MMOL/ML IV SOLN COMPARISON:  Chest CT 03/15/2019 FINDINGS: MRI THORACIC SPINE FINDINGS There is respiratory motion artifact present which mildly degrades image quality on axial sequences. Alignment:  Physiologic. Vertebrae:  No fracture, evidence of discitis, or bone lesion. Cord: No abnormal cord signal.  No abnormal enhancement. Paraspinal and other soft tissues: Negative Disc levels: Evaluation of the disc levels are demonstrates no large disc herniation, significant spinal canal or neural foraminal narrowing. MRI LUMBAR SPINE FINDINGS Segmentation:  Standard. Alignment:  Physiologic. Vertebrae: No fracture, evidence of discitis, or aggressive bone lesion. Conus medullaris and cauda equina: Conus extends to the L1 level. Conus and cauda equina appear normal. No abnormal enhancement. Paraspinal and other soft tissues: Tiny bilateral renal cysts. Disc levels: T12-L1: No significant spinal canal or neural foraminal narrowing. L1-L2: No significant spinal canal or neural foraminal narrowing. L2-L3: No significant spinal canal or neural foraminal narrowing. L3-L4: No significant spinal canal or neural foraminal narrowing. L4-L5: No significant spinal canal or neural foraminal narrowing L5-S1: No  significant spinal canal or neural foraminal narrowing. IMPRESSION: Normal MRI of the thoracic and lumbar spine. Electronically Signed   By: Maurine Simmering M.D.   On: 07/08/2021 16:45      Health Maintenance Due  Topic Date Due   COVID-19 Vaccine (  4 - Pfizer series) 08/15/2020   PAP SMEAR-Modifier  08/22/2020    There are no preventive care reminders to display for this patient.  Lab Results  Component Value Date   TSH 0.932 07/01/2021   Lab Results  Component Value Date   WBC 7.5 02/25/2022   HGB 12.7 02/25/2022   HCT 38.8 02/25/2022   MCV 85.7 02/25/2022   PLT 335.0 02/25/2022   Lab Results  Component Value Date   NA 138 02/25/2022   K 4.2 02/25/2022   CO2 27 02/25/2022   GLUCOSE 99 02/25/2022   BUN 10 02/25/2022   CREATININE 0.81 02/25/2022   BILITOT 0.4 02/25/2022   ALKPHOS 81 02/25/2022   AST 12 02/25/2022   ALT 11 02/25/2022   PROT 6.6 02/25/2022   ALBUMIN 4.4 02/25/2022   CALCIUM 9.7 02/25/2022   ANIONGAP 8 07/08/2021   EGFR 110 01/07/2022   GFR 93.61 02/25/2022   Lab Results  Component Value Date   CHOL 175 01/24/2017   Lab Results  Component Value Date   HDL 43 01/24/2017   Lab Results  Component Value Date   LDLCALC 98 01/24/2017   Lab Results  Component Value Date   TRIG 171 (H) 01/24/2017   Lab Results  Component Value Date   CHOLHDL 4.1 01/24/2017   Lab Results  Component Value Date   HGBA1C 5.3 10/28/2021  MR Brain W and Wo Contrast   Result Date: 07/02/2021 CLINICAL DATA:  Demyelinating disease EXAM: MRI HEAD WITHOUT AND WITH CONTRAST TECHNIQUE: Multiplanar, multiecho pulse sequences of the brain and surrounding structures were obtained without and with intravenous contrast. CONTRAST:  43mL GADAVIST GADOBUTROL 1 MMOL/ML IV SOLN COMPARISON:  None. FINDINGS: Brain: No acute infarct, mass effect or extra-axial collection. No acute or chronic hemorrhage. Normal white matter signal, parenchymal volume and CSF spaces. The midline structures are  normal. There is no abnormal contrast enhancement. Vascular: Major flow voids are preserved. Skull and upper cervical spine: Normal calvarium and skull base. Visualized upper cervical spine and soft tissues are normal. Sinuses/Orbits:No paranasal sinus fluid levels or advanced mucosal thickening. No mastoid or middle ear effusion. Normal orbits. IMPRESSION: Normal brain MRI. Electronically Signed   By: Deatra Robinson M.D.   On: 07/02/2021 03:31    MR CERVICAL SPINE W WO CONTRAST   Result Date: 07/02/2021 CLINICAL DATA:  Demyelinating disease EXAM: MRI CERVICAL SPINE WITHOUT AND WITH CONTRAST TECHNIQUE: Multiplanar and multiecho pulse sequences of the cervical spine, to include the craniocervical junction and cervicothoracic junction, were obtained without and with intravenous contrast. CONTRAST:  17mL GADAVIST GADOBUTROL 1 MMOL/ML IV SOLN COMPARISON:  None. FINDINGS: Alignment: Physiologic. Vertebrae: No fracture, evidence of discitis, or bone lesion. Cord: Normal signal and morphology. Posterior Fossa, vertebral arteries, paraspinal tissues: Negative. Disc levels: No spinal canal or neural foraminal stenosis. IMPRESSION: Normal MRI of the cervical spine. No evidence of demyelinating disease.     Assessment & Plan:   Problem List Items Addressed This Visit       Cardiovascular and Mediastinum   Hemorrhagic gastritis    Patient has follow-up with GI plans to have upper endoscopy and colonoscopy continue with Protonix for now        Respiratory   Asthma, moderate persistent    Continue with inhaled medications        Digestive   Chronic diarrhea - Primary    Chronic diarrhea persist work-up per GI      RESOLVED: Diarrhea of presumed infectious origin  All diarrhea studies so far negative for infectious disease origin        Other   Epigastric pain    As per gastroenterology      Sleep disturbance    Would benefit from a home sleep study      Relevant Orders   Home sleep  test   Leg edema    Labs from GI unremarkable suspect this is benign in nature      Other Visit Diagnoses     Insomnia, unspecified type       Relevant Orders   Home sleep test     No orders of the defined types were placed in this encounter.  Follow-up: Return in about 4 months (around 06/30/2022) for followup, chronic conditions.    Asencion Noble, [name]

## 2022-02-27 NOTE — Assessment & Plan Note (Signed)
As per gastroenterology

## 2022-02-27 NOTE — Assessment & Plan Note (Signed)
Labs from GI unremarkable suspect this is benign in nature

## 2022-02-27 NOTE — Patient Instructions (Signed)
Watch your water intake please increase this during the heat  Obtain uncuffed socks  Reduce the amount of processed foods you are eating  Keep your follow-up appointments with gastroenterology  Keep your Pap smear gynecology appointment  No change in medications will ensure refills are available  Sleep study will be obtained  Return to Dr. Delford Field 4 months

## 2022-02-27 NOTE — Assessment & Plan Note (Signed)
Would benefit from a home sleep study

## 2022-02-28 ENCOUNTER — Other Ambulatory Visit: Payer: Self-pay

## 2022-03-03 ENCOUNTER — Telehealth: Payer: Self-pay | Admitting: Emergency Medicine

## 2022-03-03 DIAGNOSIS — G479 Sleep disorder, unspecified: Secondary | ICD-10-CM

## 2022-03-03 NOTE — Telephone Encounter (Signed)
Copied from CRM [id number]. Topic: Referral - Question >> Mar 03, 2022  2:17 PM Everette C wrote: Reason for CRM: The patient has called to follow up on their previous discussion about a referral for an at home sleep study   The patient would like to know what facility will be contacting them to schedule and more information about the process of the study itself   Please contact further when possible

## 2022-03-06 NOTE — Telephone Encounter (Signed)
I called the sleep center and they said that you need to change the date to future, the date they have is 02/27/22 and it automatically went to finalized on their end. Once changed they will be able to schedule patient.  I called patient as well and she is aware of everything.

## 2022-03-06 NOTE — Addendum Note (Signed)
Addended by: Shan Levans E on: 03/06/2022 12:11 PM   Modules accepted: Orders

## 2022-03-06 NOTE — Telephone Encounter (Signed)
done

## 2022-03-07 ENCOUNTER — Ambulatory Visit (HOSPITAL_COMMUNITY)
Admission: RE | Admit: 2022-03-07 | Discharge: 2022-03-07 | Disposition: A | Discharge status: Home / Self Care | Payer: Self-pay | Source: Ambulatory Visit | Attending: Physician Assistant | Admitting: Physician Assistant

## 2022-03-07 DIAGNOSIS — R112 Nausea with vomiting, unspecified: Secondary | ICD-10-CM | POA: Insufficient documentation

## 2022-03-07 DIAGNOSIS — R1013 Epigastric pain: Secondary | ICD-10-CM | POA: Insufficient documentation

## 2022-03-07 DIAGNOSIS — K219 Gastro-esophageal reflux disease without esophagitis: Secondary | ICD-10-CM | POA: Insufficient documentation

## 2022-03-10 ENCOUNTER — Other Ambulatory Visit: Payer: Self-pay | Admitting: Critical Care Medicine

## 2022-03-11 ENCOUNTER — Telehealth: Payer: Self-pay | Admitting: Physician Assistant

## 2022-03-11 ENCOUNTER — Other Ambulatory Visit: Payer: Self-pay

## 2022-03-11 MED ORDER — SUCRALFATE 1 G PO TABS
1.0000 g | ORAL_TABLET | Freq: Three times a day (TID) | ORAL | 0 refills | Status: DC
Start: 2022-03-11 — End: 2022-04-18

## 2022-03-11 NOTE — Telephone Encounter (Signed)
Requested Prescriptions  Pending Prescriptions Disp Refills  . pantoprazole (PROTONIX) 40 MG tablet [Pharmacy Med Name: PANTOPRAZOLE DR 40 MG TAB[*]] 30 tablet 2    Sig: TAKE ONE TABLET BY MOUTH ONE TIME DAILY     Gastroenterology: Proton Pump Inhibitors Passed - 03/10/2022 10:53 AM      Passed - Valid encounter within last 12 months    Recent Outpatient Visits          1 week ago Chronic diarrhea   [place] [facility] Storm Frisk, MD   2 months ago Diarrhea of presumed infectious origin   [facility] Storm Frisk, MD   2 months ago Encounter for other general counseling or advice on contraception   [facility], Cari S, PA-C   4 months ago Class 3 severe obesity due to excess calories without serious comorbidity with body mass index (BMI) of 45.0 to 49.9 in adult [facility])   [facility] Storm Frisk, MD   7 months ago Weakness   [facility] [place], Marzella Schlein, [place]      Future Appointments            In 3 months Delford Field Charlcie Cradle, MD [facility]

## 2022-03-11 NOTE — Telephone Encounter (Signed)
Spoke with patient regarding PA recommendations. Carafate has been sent to pharmacy. Patient advised to call back with any further questions or concerns.

## 2022-03-11 NOTE — Telephone Encounter (Signed)
Patient called in with complaints of frequent, urgent, loose-watery diarrhea for the last week. She is going up to 5 times/day, and each time is a large amount. At the time of her OV on 02/25/22 she did have complaints of diarrhea, however she feels that it has worsened. She also has complaints of headaches that are waking her up at night that started after increasing the pantoprazole to BID. Pt advised to drink plenty of fluids & stick with a bland diet for now. Will route to PA for further recommendations.

## 2022-03-11 NOTE — Telephone Encounter (Signed)
PT was recently put on Protonix and says that for the past 6 days she has had severe diarrhea. Wants to know what she can do to get relief. Please reach out to advise. Thank you.

## 2022-03-11 NOTE — Telephone Encounter (Signed)
Patient scheduled for right upper quadrant ultrasound, endoscopy and colonoscopy. Please continue with these. Can cut back to pantoprazole to once daily, will add on Carafate 1 g up to 3 times daily before food, please try to take this at least 4 hours away from Lamictal and nighttime medications. If able only take it twice a day. Carafate helps decrease diarrhea as well as nausea. Can take Imodium as needed for diarrhea. Agree with increasing fluids and electrolytes, bland diet/FODMAP diet. Go to the ER if you are not peeing regularly, unable to take oral fluids, vomiting, severe weakness/dizziness, severe abdominal pain , chest pain or shortness of breath.

## 2022-03-14 ENCOUNTER — Telehealth: Payer: Self-pay

## 2022-03-14 NOTE — Telephone Encounter (Signed)
The pt procedure with Dr Christella Hartigan has been moved to 9/21 with Dr Leonides Schanz at 830 am at [facility]   Left message on machine to call back

## 2022-03-17 ENCOUNTER — Other Ambulatory Visit: Payer: Self-pay

## 2022-03-17 MED ORDER — ESCITALOPRAM OXALATE 20 MG PO TABS
ORAL_TABLET | Freq: Every day | ORAL | 0 refills | Status: DC
  Filled 2022-03-17 – 2022-04-08 (×2): qty 90, 90d supply, fill #0

## 2022-03-17 MED ORDER — LITHIUM CARBONATE ER 300 MG PO TBCR
900.0000 mg | EXTENDED_RELEASE_TABLET | Freq: Every day | ORAL | 0 refills | Status: DC
  Filled 2022-03-17: qty 270, 90d supply, fill #0

## 2022-03-17 MED ORDER — LAMOTRIGINE 200 MG PO TABS
ORAL_TABLET | ORAL | 0 refills | Status: DC
  Filled 2022-03-17: qty 90, 90d supply, fill #0

## 2022-03-17 NOTE — Telephone Encounter (Signed)
Left message on machine to call back  

## 2022-03-17 NOTE — Telephone Encounter (Signed)
Colon scheduled, pt instructed and medications reviewed.  Patient instructions mailed to home.  Patient to call with any questions or concerns.  

## 2022-03-21 ENCOUNTER — Other Ambulatory Visit: Payer: Self-pay

## 2022-03-24 ENCOUNTER — Encounter: Payer: Self-pay | Admitting: Family Medicine

## 2022-03-26 ENCOUNTER — Other Ambulatory Visit: Payer: Self-pay

## 2022-03-26 MED ORDER — ARIPIPRAZOLE 5 MG PO TABS
ORAL_TABLET | ORAL | 1 refills | Status: DC
  Filled 2022-03-26: qty 30, 30d supply, fill #0
  Filled 2022-04-08 – 2022-05-05 (×4): qty 30, 30d supply, fill #1

## 2022-03-27 ENCOUNTER — Other Ambulatory Visit: Payer: Self-pay

## 2022-04-08 ENCOUNTER — Other Ambulatory Visit: Payer: Self-pay

## 2022-04-16 ENCOUNTER — Ambulatory Visit (HOSPITAL_BASED_OUTPATIENT_CLINIC_OR_DEPARTMENT_OTHER): Payer: No Typology Code available for payment source | Admitting: Internal Medicine

## 2022-04-16 DIAGNOSIS — G479 Sleep disorder, unspecified: Secondary | ICD-10-CM

## 2022-04-17 ENCOUNTER — Encounter (HOSPITAL_COMMUNITY): Payer: Self-pay | Admitting: Internal Medicine

## 2022-04-18 ENCOUNTER — Other Ambulatory Visit: Payer: Self-pay

## 2022-04-18 ENCOUNTER — Encounter (HOSPITAL_COMMUNITY): Payer: Self-pay | Admitting: Internal Medicine

## 2022-04-22 ENCOUNTER — Ambulatory Visit (HOSPITAL_BASED_OUTPATIENT_CLINIC_OR_DEPARTMENT_OTHER): Payer: No Typology Code available for payment source | Attending: Critical Care Medicine | Admitting: Internal Medicine

## 2022-04-22 DIAGNOSIS — G4733 Obstructive sleep apnea (adult) (pediatric): Secondary | ICD-10-CM | POA: Insufficient documentation

## 2022-04-22 DIAGNOSIS — R0683 Snoring: Secondary | ICD-10-CM | POA: Insufficient documentation

## 2022-04-22 DIAGNOSIS — G479 Sleep disorder, unspecified: Secondary | ICD-10-CM

## 2022-04-24 ENCOUNTER — Encounter (HOSPITAL_COMMUNITY): Payer: Self-pay | Admitting: Internal Medicine

## 2022-04-24 ENCOUNTER — Other Ambulatory Visit: Payer: Self-pay

## 2022-04-24 ENCOUNTER — Encounter (HOSPITAL_COMMUNITY)
Admission: RE | Disposition: A | Discharge status: Home / Self Care | Payer: Self-pay | Source: Home / Self Care | Attending: Internal Medicine

## 2022-04-24 ENCOUNTER — Ambulatory Visit (HOSPITAL_COMMUNITY)
Admission: RE | Admit: 2022-04-24 | Discharge: 2022-04-24 | Disposition: A | Discharge status: Home / Self Care | Payer: No Typology Code available for payment source | Attending: Internal Medicine | Admitting: Internal Medicine

## 2022-04-24 ENCOUNTER — Ambulatory Visit (HOSPITAL_COMMUNITY): Payer: No Typology Code available for payment source | Admitting: Anesthesiology

## 2022-04-24 ENCOUNTER — Ambulatory Visit (HOSPITAL_BASED_OUTPATIENT_CLINIC_OR_DEPARTMENT_OTHER): Payer: No Typology Code available for payment source | Admitting: Anesthesiology

## 2022-04-24 DIAGNOSIS — J45909 Unspecified asthma, uncomplicated: Secondary | ICD-10-CM | POA: Insufficient documentation

## 2022-04-24 DIAGNOSIS — K3189 Other diseases of stomach and duodenum: Secondary | ICD-10-CM | POA: Insufficient documentation

## 2022-04-24 DIAGNOSIS — R1013 Epigastric pain: Secondary | ICD-10-CM

## 2022-04-24 DIAGNOSIS — K648 Other hemorrhoids: Secondary | ICD-10-CM | POA: Insufficient documentation

## 2022-04-24 DIAGNOSIS — Z6841 Body Mass Index (BMI) 40.0 and over, adult: Secondary | ICD-10-CM | POA: Insufficient documentation

## 2022-04-24 DIAGNOSIS — R197 Diarrhea, unspecified: Secondary | ICD-10-CM | POA: Insufficient documentation

## 2022-04-24 DIAGNOSIS — R12 Heartburn: Secondary | ICD-10-CM

## 2022-04-24 DIAGNOSIS — R112 Nausea with vomiting, unspecified: Secondary | ICD-10-CM

## 2022-04-24 DIAGNOSIS — F418 Other specified anxiety disorders: Secondary | ICD-10-CM | POA: Insufficient documentation

## 2022-04-24 DIAGNOSIS — K219 Gastro-esophageal reflux disease without esophagitis: Secondary | ICD-10-CM | POA: Insufficient documentation

## 2022-04-24 HISTORY — PX: BIOPSY: SHX5522

## 2022-04-24 HISTORY — DX: Attention-deficit hyperactivity disorder, unspecified type: F90.9

## 2022-04-24 HISTORY — DX: Pneumonia, unspecified organism: J18.9

## 2022-04-24 HISTORY — PX: ESOPHAGOGASTRODUODENOSCOPY (EGD) WITH PROPOFOL: SHX5813

## 2022-04-24 HISTORY — DX: Headache, unspecified: R51.9

## 2022-04-24 HISTORY — PX: COLONOSCOPY: SHX5424

## 2022-04-24 LAB — PREGNANCY, URINE: Preg Test, Ur: NEGATIVE

## 2022-04-24 SURGERY — ESOPHAGOGASTRODUODENOSCOPY (EGD) WITH PROPOFOL
Anesthesia: Monitor Anesthesia Care

## 2022-04-24 MED ORDER — SODIUM CHLORIDE 0.9 % IV SOLN: INTRAVENOUS | Status: DC

## 2022-04-24 MED ORDER — PROPOFOL 1000 MG/100ML IV EMUL
INTRAVENOUS | Status: AC
  Filled 2022-04-24: qty 100

## 2022-04-24 MED ORDER — LIDOCAINE 2% (20 MG/ML) 5 ML SYRINGE
INTRAMUSCULAR | Status: DC | PRN
  Administered 2022-04-24: 80 mg via INTRAVENOUS

## 2022-04-24 MED ORDER — ESOMEPRAZOLE MAGNESIUM 40 MG PO CPDR
40.0000 mg | DELAYED_RELEASE_CAPSULE | Freq: Two times a day (BID) | ORAL | 1 refills | Status: DC
  Filled 2022-04-24 – 2022-05-05 (×2): qty 60, 30d supply, fill #0

## 2022-04-24 MED ORDER — PANTOPRAZOLE SODIUM 40 MG PO TBEC
40.0000 mg | DELAYED_RELEASE_TABLET | Freq: Two times a day (BID) | ORAL | 1 refills | Status: DC
  Filled 2022-04-24: qty 60, 30d supply, fill #0

## 2022-04-24 MED ORDER — PROPOFOL 500 MG/50ML IV EMUL
INTRAVENOUS | Status: DC | PRN
  Administered 2022-04-24: 125 ug/kg/min via INTRAVENOUS

## 2022-04-24 MED ORDER — LACTATED RINGERS IV SOLN
INTRAVENOUS | Status: DC
  Administered 2022-04-24: 1000 mL via INTRAVENOUS

## 2022-04-24 MED ORDER — PROPOFOL 10 MG/ML IV BOLUS
INTRAVENOUS | Status: DC | PRN
  Administered 2022-04-24 (×2): 30 mg via INTRAVENOUS
  Administered 2022-04-24: 20 mg via INTRAVENOUS

## 2022-04-24 MED ORDER — PROPOFOL 500 MG/50ML IV EMUL
INTRAVENOUS | Status: AC
  Filled 2022-04-24: qty 50

## 2022-04-24 SURGICAL SUPPLY — 25 items

## 2022-04-24 NOTE — Discharge Instructions (Signed)
YOU HAD AN ENDOSCOPIC PROCEDURE TODAY: Refer to the procedure report and other information in the discharge instructions given to you for any specific questions about what was found during the examination. If this information does not answer your questions, please call [place] office at [phone] to clarify.  ° °YOU SHOULD EXPECT: Some feelings of bloating in the abdomen. Passage of more gas than usual. Walking can help get rid of the air that was put into your GI tract during the procedure and reduce the bloating. If you had a lower endoscopy (such as a colonoscopy or flexible sigmoidoscopy) you may notice spotting of blood in your stool or on the toilet paper. Some abdominal soreness may be present for a day or two, also. ° °DIET: Your first meal following the procedure should be a light meal and then it is ok to progress to your normal diet. A half-sandwich or bowl of soup is an example of a good first meal. Heavy or fried foods are harder to digest and may make you feel nauseous or bloated. Drink plenty of fluids but you should avoid alcoholic beverages for 24 hours. If you had a esophageal dilation, please see attached instructions for diet.   ° °ACTIVITY: Your care partner should take you home directly after the procedure. You should plan to take it easy, moving slowly for the rest of the day. You can resume normal activity the day after the procedure however YOU SHOULD NOT DRIVE, use power tools, machinery or perform tasks that involve climbing or major physical exertion for 24 hours (because of the sedation medicines used during the test).  ° °SYMPTOMS TO REPORT IMMEDIATELY: °A gastroenterologist can be reached at any hour. Please call [phone]  for any of the following symptoms:  °Following lower endoscopy (colonoscopy, flexible sigmoidoscopy) °Excessive amounts of blood in the stool  °Significant tenderness, worsening of abdominal pains  °Swelling of the abdomen that is new, acute  °Fever of 100° or  higher  °Following upper endoscopy (EGD, EUS, ERCP, esophageal dilation) °Vomiting of blood or coffee ground material  °New, significant abdominal pain  °New, significant chest pain or pain under the shoulder blades  °Painful or persistently difficult swallowing  °New shortness of breath  °Black, tarry-looking or red, bloody stools ° °FOLLOW UP:  °If any biopsies were taken you will be contacted by phone or by letter within the next 1-3 weeks. Call [phone]  if you have not heard about the biopsies in 3 weeks.  °Please also call with any specific questions about appointments or follow up tests. ° °

## 2022-04-24 NOTE — Op Note (Addendum)
[facility] Patient Name: [name] [name] Procedure Date: 04/24/2022 MRN: [id number] Attending MD: Georgian Co ,  Date of Birth: [birth year] CSN: [id number] Age: 36 Admit Type: Outpatient Procedure:                Upper GI endoscopy Indications:              Epigastric abdominal pain, Heartburn, Nausea with                            vomiting Providers:                Adline Mango" Trudie Buckler, RN, Cherylynn Ridges, Technician Referring MD:             Asencion Noble, MD Medicines:                Monitored Anesthesia Care Complications:            No immediate complications. Estimated Blood Loss:     Estimated blood loss was minimal. Procedure:                Pre-Anesthesia Assessment:                           - Prior to the procedure, a History and Physical                            was performed, and patient medications and                            allergies were reviewed. The patient's tolerance of                            previous anesthesia was also reviewed. The risks                            and benefits of the procedure and the sedation                            options and risks were discussed with the patient.                            All questions were answered, and informed consent                            was obtained. Prior Anticoagulants: The patient has                            taken no previous anticoagulant or antiplatelet                            agents. ASA Grade Assessment: III - A patient with  severe systemic disease. After reviewing the risks                            and benefits, the patient was deemed in                            satisfactory condition to undergo the procedure.                           After obtaining informed consent, the endoscope was                            passed under direct vision. Throughout the                             procedure, the patient's blood pressure, pulse, and                            oxygen saturations were monitored continuously. The                            [id number]-[id number] ([id number]) Olympus endoscope was introduced                            through the mouth, and advanced to the second part                            of duodenum. The upper GI endoscopy was                            accomplished without difficulty. The patient                            tolerated the procedure well. Scope In: Scope Out: Findings:      The examined esophagus was normal. Biopsies were taken with a cold       forceps for histology.      Localized erythematous mucosa without bleeding was found in the gastric       antrum. Biopsies were taken with a cold forceps for histology.      The examined duodenum was normal. Biopsies were taken with a cold       forceps for histology. Impression:               - Normal esophagus. Biopsied.                           - Erythematous mucosa in the antrum. Biopsied.                           - Normal examined duodenum. Biopsied. Moderate Sedation:      Not Applicable - Patient had care per Anesthesia. Recommendation:           - Await pathology results.                           -  Perform a colonoscopy today.                           - Use Nexium (esomeprazole) 40 mg PO BID for 8                            weeks. Procedure Code(s):        --- Professional ---                           [phone], Esophagogastroduodenoscopy, flexible,                            transoral; with biopsy, single or multiple Diagnosis Code(s):        --- Professional ---                           K31.89, Other diseases of stomach and duodenum                           R10.13, Epigastric pain                           R12, Heartburn                           R11.2, Nausea with vomiting, unspecified CPT copyright 2019 American Medical Association. All rights reserved. The codes documented in this  report are preliminary and upon coder review may  be revised to meet current compliance requirements. Dr Particia Lather "Alan Ripper" Leonides Schanz,  04/24/2022 9:33:21 AM Number of Addenda: 0

## 2022-04-24 NOTE — Op Note (Signed)
[facility] Patient Name: [name] [name] Procedure Date: 04/24/2022 MRN: [id number] Attending MD: Georgian Co ,  Date of Birth: Aug 12, 1985 CSN: [id number] Age: 36 Admit Type: Outpatient Procedure:                Colonoscopy Indications:              Diarrhea Providers:                Adline Mango" Trudie Buckler, RN, Cherylynn Ridges, Technician, [facility], CRNA Referring MD:             Asencion Noble, MD Medicines:                Monitored Anesthesia Care Complications:            No immediate complications. Estimated Blood Loss:     Estimated blood loss was minimal. Procedure:                Pre-Anesthesia Assessment:                           - Prior to the procedure, a History and Physical                            was performed, and patient medications and                            allergies were reviewed. The patient's tolerance of                            previous anesthesia was also reviewed. The risks                            and benefits of the procedure and the sedation                            options and risks were discussed with the patient.                            All questions were answered, and informed consent                            was obtained. Prior Anticoagulants: The patient has                            taken no previous anticoagulant or antiplatelet                            agents. ASA Grade Assessment: III - A patient with                            severe systemic disease. After reviewing the risks  and benefits, the patient was deemed in                            satisfactory condition to undergo the procedure.                           After obtaining informed consent, the colonoscope                            was passed under direct vision. Throughout the                            procedure, the patient's blood pressure, pulse, and                             oxygen saturations were monitored continuously. The                            [id number]-[id number] Olympus colonoscope was                            introduced through the anus and advanced to the the                            terminal ileum. The colonoscopy was performed                            without difficulty. The patient tolerated the                            procedure well. The quality of the bowel                            preparation was good. The terminal ileum, ileocecal                            valve, appendiceal orifice, and rectum were                            photographed. Scope In: 9:13:18 AM Scope Out: 9:26:41 AM Scope Withdrawal Time: 0 hours 11 minutes 28 seconds  Total Procedure Duration: 0 hours 13 minutes 23 seconds  Findings:      The terminal ileum appeared normal.      Non-bleeding internal hemorrhoids were found during retroflexion.      Biopsies for histology were taken with a cold forceps from the entire       colon for evaluation of microscopic colitis. Impression:               - The examined portion of the ileum was normal.                           - Non-bleeding internal hemorrhoids.                           - Biopsies were  taken with a cold forceps from the                            entire colon for evaluation of microscopic colitis. Moderate Sedation:      Not Applicable - Patient had care per Anesthesia. Recommendation:           - Discharge patient to home (with escort).                           - Await pathology results.                           - Return to GI clinic in 6 weeks.                           - The findings and recommendations were discussed                            with the patient. Procedure Code(s):        --- Professional ---                           [phone], Colonoscopy, flexible; with biopsy, single                            or multiple Diagnosis Code(s):        --- Professional ---                            K64.8, Other hemorrhoids                           R19.7, Diarrhea, unspecified CPT copyright 2019 American Medical Association. All rights reserved. The codes documented in this report are preliminary and upon coder review may  be revised to meet current compliance requirements. Dr Georgian Co "[name]" [name],  04/24/2022 9:35:29 AM Number of Addenda: 0

## 2022-04-24 NOTE — Transfer of Care (Signed)
Immediate Anesthesia Transfer of Care Note  Patient: [name] [name]  Procedure(s) Performed: COLONOSCOPY WITH PROPOFOL ESOPHAGOGASTRODUODENOSCOPY (EGD) WITH PROPOFOL BIOPSY  Patient Location: Endoscopy Unit  Anesthesia Type:MAC  Level of Consciousness: awake and alert   Airway & Oxygen Therapy: Patient Spontanous Breathing and Patient connected to face mask oxygen  Post-op Assessment: Report given to RN  Post vital signs: Reviewed and stable  Last Vitals:  Vitals Value Taken Time  BP    Temp    Pulse 74 04/24/22 0934  Resp 20 04/24/22 0934  SpO2 100 % 04/24/22 0934  Vitals shown include unvalidated device data.  Last Pain:  Vitals:   04/24/22 0732  TempSrc: Tympanic  PainSc: 0-No pain         Complications: No notable events documented.

## 2022-04-24 NOTE — Anesthesia Postprocedure Evaluation (Signed)
Anesthesia Post Note  Patient: [name] [name]  Procedure(s) Performed: COLONOSCOPY WITH PROPOFOL ESOPHAGOGASTRODUODENOSCOPY (EGD) WITH PROPOFOL BIOPSY     Patient location during evaluation: PACU Anesthesia Type: MAC Level of consciousness: awake and alert Pain management: pain level controlled Vital Signs Assessment: post-procedure vital signs reviewed and stable Respiratory status: spontaneous breathing, nonlabored ventilation and respiratory function stable Cardiovascular status: blood pressure returned to baseline and stable Postop Assessment: no apparent nausea or vomiting Anesthetic complications: no   No notable events documented.  Last Vitals:  Vitals:   04/24/22 0937 04/24/22 0940  BP: 106/62 117/66  Pulse: 69 71  Resp: (!) 28 (!) 26  Temp:    SpO2: 98% 98%    Last Pain:  Vitals:   04/24/22 0930  TempSrc: Oral  PainSc:                  Pervis Hocking

## 2022-04-24 NOTE — Anesthesia Preprocedure Evaluation (Addendum)
Anesthesia Evaluation  Patient identified by MRN, date of birth, ID band Patient awake    Reviewed: Allergy & Precautions, NPO status , Patient's Chart, lab work & pertinent test results  Airway Mallampati: III  TM Distance: >3 FB Neck ROM: Full    Dental  (+) Teeth Intact, Dental Advisory Given   Pulmonary asthma (uses inhaler every night) ,    Pulmonary exam normal breath sounds clear to auscultation       Cardiovascular negative cardio ROS Normal cardiovascular exam Rhythm:Regular Rate:Normal     Neuro/Psych  Headaches, PSYCHIATRIC DISORDERS Anxiety Depression    GI/Hepatic Neg liver ROS, GERD  Controlled,  Endo/Other  Morbid obesityBMI 52  Renal/GU negative Renal ROS  negative genitourinary   Musculoskeletal negative musculoskeletal ROS (+)   Abdominal (+) + obese,   Peds  Hematology negative hematology ROS (+)   Anesthesia Other Findings   Reproductive/Obstetrics negative OB ROS                            Anesthesia Physical Anesthesia Plan  ASA: 3  Anesthesia Plan: MAC   Post-op Pain Management:    Induction:   PONV Risk Score and Plan: 2 and Propofol infusion and TIVA  Airway Management Planned: Natural Airway and Simple Face Mask  Additional Equipment: None  Intra-op Plan:   Post-operative Plan:   Informed Consent: I have reviewed the patients History and Physical, chart, labs and discussed the procedure including the risks, benefits and alternatives for the proposed anesthesia with the patient or authorized representative who has indicated his/her understanding and acceptance.       Plan Discussed with: CRNA  Anesthesia Plan Comments:         Anesthesia Quick Evaluation

## 2022-04-24 NOTE — H&P (Signed)
GASTROENTEROLOGY PROCEDURE H&P NOTE   Primary Care Physician: [name], MD    Reason for Procedure:   N&V, GERD, epigastric ab pain, diarrhea  Plan:    EGD/colonoscopy  Patient is appropriate for endoscopic procedure(s) in the hospital setting.  The nature of the procedure, as well as the risks, benefits, and alternatives were carefully and thoroughly reviewed with the patient. Ample time for discussion and questions allowed. The patient understood, was satisfied, and agreed to proceed.     HPI: [name] [name] is a 36 y.o. female who presents for EGD/colonoscopy for evaluation of N&V, GERD, epigastric ab pain, and diarrhea .  Patient was most recently seen in the Gastroenterology Clinic on 02/25/22.  No interval change in medical history since that appointment. Please refer to that note for full details regarding GI history and clinical presentation.   Past Medical History:  Diagnosis Date   ADHD (attention deficit hyperactivity disorder)    Anxiety    Asthma    Brain fog 06/12/2021   Depression    Phreesia 06/30/2020   GERD (gastroesophageal reflux disease)    Headache    History of kidney stones    IBS (irritable bowel syndrome)    Lower extremity weakness 07/08/2021   Obesity    Pneumonia    Pneumonitis 03/02/2019   Sinusitis, acute 01/01/2021   Weakness 07/08/2021    Past Surgical History:  Procedure Laterality Date   CYSTOSCOPY WITH RETROGRADE PYELOGRAM, URETEROSCOPY AND STENT PLACEMENT Right 12/10/2016   Procedure: CYSTOSCOPY WITH RETROGRADE PYELOGRAM, URETEROSCOPY AND STONE REMOVAL;  Surgeon: Raynelle Bring, MD;  Location: [facility];  Service: Urology;  Laterality: Right;   WISDOM TOOTH EXTRACTION  at age 89    Prior to Admission medications   Medication Sig Start Date End Date Taking? Authorizing Provider  acetaminophen (TYLENOL) 500 MG tablet Take 1,000 mg by mouth every 6 (six) hours as needed for moderate pain.   Yes [provider]   albuterol (VENTOLIN HFA) 108 (90 Base) MCG/ACT inhaler Inhale 2 puffs into the lungs every 6 (six) hours as needed for wheezing or shortness of breath. Patient taking differently: Inhale 2 puffs into the lungs 2 (two) times daily as needed (Asthma). 01/07/22  Yes [name], MD  ALPRAZolam Duanne Moron) 0.5 MG tablet Take 0.25-0.5 mg by mouth every 6 (six) hours as needed for anxiety. 08/13/21  Yes [provider]  ARIPiprazole (ABILIFY) 5 MG tablet take 1 tablet by mouth daily in the morning Patient taking differently: Take 5 mg by mouth at bedtime. 03/26/22  Yes   cholecalciferol (VITAMIN D3) 25 MCG (1000 UNIT) tablet Take 1,000 Units by mouth daily.   Yes [provider]  cyanocobalamin (VITAMIN B12) 500 MCG tablet Take 500 mcg by mouth daily.   Yes [provider]  escitalopram (LEXAPRO) 20 MG tablet Take one tablet by mouth everyday Patient taking differently: Take 20 mg by mouth at bedtime. 03/17/22  Yes   fluticasone (FLONASE) 50 MCG/ACT nasal spray Place into both nostrils daily.   Yes [provider]  fluticasone furoate-vilanterol (BREO ELLIPTA) 100-25 MCG/ACT AEPB Inhale 1 puff into the lungs daily. 08/07/21  Yes [name], MD  guaiFENesin (MUCINEX) 600 MG 12 hr tablet Take 600 mg by mouth daily as needed (congestion).   Yes [provider]  lamoTRIgine (LAMICTAL) 200 MG tablet Take 1 tablet (200 mg total) by mouth at bedtime. 10/28/21 04/26/22 Yes [name], MD  lamoTRIgine (LAMICTAL) 200 MG tablet  Take 1 oral tablet at bedtime (if out of lamictal more than 1 wk do not restart, call MD) 03/17/22  Yes   lithium carbonate (LITHOBID) 300 MG CR tablet Take 3 tablets at bedtime 03/17/22  Yes   loratadine (CLARITIN) 10 MG tablet Take 10 mg by mouth daily.   Yes [provider]  modafinil (PROVIGIL) 200 MG tablet Take 200 mg by mouth every morning. 12/16/21  Yes [provider]  montelukast (SINGULAIR) 10 MG tablet Take 1  tablet (10 mg total) by mouth at bedtime. 10/28/21 05/08/22 Yes Storm Frisk, MD  pantoprazole (PROTONIX) 40 MG tablet TAKE ONE TABLET BY MOUTH ONE TIME DAILY 03/11/22  Yes Storm Frisk, MD  pramipexole (MIRAPEX) 1.5 MG tablet Take 1 tablet by mouth at bedtime 12/12/21  Yes   escitalopram (LEXAPRO) 20 MG tablet Take 1 tablet (20 mg total) by mouth daily. Patient not taking: Reported on 04/18/2022 11/20/21   Storm Frisk, MD  lithium carbonate (LITHOBID) 300 MG CR tablet Take 3 tablets by mouth once nightly at bedtime. Patient not taking: Reported on 04/18/2022 12/12/21       Current Facility-Administered Medications  Medication Dose Route Frequency Provider Last Rate Last Admin   0.9 %  sodium chloride infusion   Intravenous Continuous Rachael Fee, MD       lactated ringers infusion   Intravenous Continuous Imogene Burn, MD 10 mL/hr at 04/24/22 0737 1,000 mL at 04/24/22 0737    Allergies as of 02/25/2022 - Review Complete 02/25/2022  Allergen Reaction Noted   Onion Other (See Comments) 01/07/2022   Other  06/29/2020   Wellbutrin [bupropion] Hives 11/17/2016    Family History  Problem Relation Age of Onset   Depression Mother    Anxiety disorder Mother    Stomach cancer Neg Hx    Colon cancer Neg Hx    Esophageal cancer Neg Hx     Social History   Socioeconomic History   Marital status: Significant Other    Spouse name: Not on file   Number of children: 0   Years of education: Not on file   Highest education level: Not on file  Occupational History   Occupation: artist  Tobacco Use   Smoking status: Never   Smokeless tobacco: Never  Vaping Use   Vaping Use: Never used  Substance and Sexual Activity   Alcohol use: Never    Alcohol/week: 1.0 standard drink of alcohol    Types: 1 Glasses of wine per week    Comment: per week   Drug use: No   Sexual activity: Yes    Birth control/protection: None, Condom  Other Topics Concern   Not on file  Social  History Narrative   Not on file   Social Determinants of Health   Financial Resource Strain: Not on file  Food Insecurity: Not on file  Transportation Needs: Not on file  Physical Activity: Not on file  Stress: Not on file  Social Connections: Not on file  Intimate Partner Violence: Not on file    Physical Exam: Vital signs in last 24 hours: BP (!) 128/47   Pulse 87   Temp 97.7 F (36.5 C) (Tympanic)   Resp 15   Ht 5\' 4"  (1.626 m)   Wt (!) 136.1 kg   LMP 04/18/2022   SpO2 100%   BMI 51.50 kg/m  GEN: NAD EYE: Sclerae anicteric ENT: MMM CV: Non-tachycardic Pulm: No increased WOB GI: Soft NEURO:  Alert &  Oriented   Eulah Pont, MD [facility]   04/24/2022 8:00 AM

## 2022-04-25 LAB — SURGICAL PATHOLOGY

## 2022-04-26 ENCOUNTER — Other Ambulatory Visit (HOSPITAL_COMMUNITY): Payer: Self-pay

## 2022-04-26 ENCOUNTER — Other Ambulatory Visit: Payer: Self-pay

## 2022-04-26 DIAGNOSIS — G479 Sleep disorder, unspecified: Secondary | ICD-10-CM

## 2022-04-26 NOTE — Progress Notes (Addendum)
    Patient Name: [name] [name], [name] [name] Study Date: 04/23/2022 Gender: Female D.O.B: May 30, 1986 Age (years): 36 Referring Provider: Asencion Noble Height (inches): 37 Interpreting Physician: Baird Lyons MD, ABSM Weight (lbs): 300 RPSGT: Jacolyn Reedy BMI: 51 MRN: [id number] Neck Size: 17.00  CLINICAL INFORMATION Sleep Study Type: HST Indication for sleep study: OSA Epworth Sleepiness Score: 20  SLEEP STUDY TECHNIQUE A multi-channel overnight portable sleep study was performed. The channels recorded were: nasal airflow, thoracic respiratory movement, and oxygen saturation with a pulse oximetry. Snoring was also monitored.  MEDICATIONS Patient self administered medications include: none reported.  SLEEP ARCHITECTURE Patient was studied for 378.8 minutes. The sleep efficiency was 100.0 % and the patient was supine for 0%. The arousal index was 0.0 per hour.  RESPIRATORY PARAMETERS The overall AHI was 3.3 per hour, with a central apnea index of 0 per hour. The oxygen nadir was 92% during sleep.  CARDIAC DATA Mean heart rate during sleep was 79.2 bpm.  IMPRESSIONS - No significant obstructive sleep apnea occurred during this study (AHI = 3.3/h). - The patient had minimal or no oxygen desaturation during the study (Min O2 = 92%) - Patient snored.  DIAGNOSIS - Primary Snoring  RECOMMENDATIONS - Manage for snoring and symptoms based on clinical judgment. - Sleep hygiene should be reviewed to assess factors that may improve sleep quality. - Weight management and regular exercise should be initiated or continued. - Patient may benefit from in-lab study  [Electronically signed] 04/26/2022 12:02 PM  Baird Lyons MD, [place], American Board of Sleep Medicine NPI: [id number]                         [place], [place] [facility]  ELECTRONICALLY SIGNED ON:  04/26/2022, 12:04 PM [place] PH: [phone]   FX: [phone] Mobile

## 2022-04-26 NOTE — Procedures (Signed)
`  sleep       Patient Name: [name], [name] [name] Referring Provider: Asencion Noble Height (inches): 108 Interpreting Physician: Baird Lyons MD, ABSM Weight (lbs): 300 RPSGT: Jacolyn Reedy BMI: 51 MRN: [id number] Neck Size: 17.00   CLINICAL INFORMATION Sleep Study Type: HST Indication for sleep study: OSA Epworth Sleepiness Score: 20   SLEEP STUDY TECHNIQUE A multi-channel overnight portable sleep study was performed. The channels recorded were: nasal airflow, thoracic respiratory movement, and oxygen saturation with a pulse oximetry. Snoring was also monitored.   MEDICATIONS Patient self administered medications include: none reported.   SLEEP ARCHITECTURE Patient was studied for 378.8 minutes. The sleep efficiency was 100.0 % and the patient was supine for 0%. The arousal index was 0.0 per hour.   RESPIRATORY PARAMETERS The overall AHI was 3.3 per hour, with a central apnea index of 0 per hour. The oxygen nadir was 92% during sleep.   CARDIAC DATA Mean heart rate during sleep was 79.2 bpm.   IMPRESSIONS - No significant obstructive sleep apnea occurred during this study (AHI = 3.3/h). - The patient had minimal or no oxygen desaturation during the study (Min O2 = 92%) - Patient snored.   DIAGNOSIS - Primary Snoring   RECOMMENDATIONS - Manage for snoring and symptoms based on clinical judgment. - Sleep hygiene should be reviewed to assess factors that may improve sleep quality. - Weight management and regular exercise should be initiated or continued. - Patient may benefit from in-lab study   [Electronically signed] 04/26/2022 12:02 PM   Baird Lyons MD, [place], [place] [employer] NPI: [id number]

## 2022-04-27 ENCOUNTER — Encounter (HOSPITAL_COMMUNITY): Payer: Self-pay | Admitting: Internal Medicine

## 2022-04-28 ENCOUNTER — Other Ambulatory Visit: Payer: Self-pay

## 2022-04-28 ENCOUNTER — Other Ambulatory Visit: Payer: Self-pay | Admitting: Critical Care Medicine

## 2022-04-28 DIAGNOSIS — G47419 Narcolepsy without cataplexy: Secondary | ICD-10-CM

## 2022-04-30 ENCOUNTER — Other Ambulatory Visit: Payer: Self-pay

## 2022-05-05 ENCOUNTER — Other Ambulatory Visit (HOSPITAL_COMMUNITY): Payer: Self-pay

## 2022-05-05 ENCOUNTER — Other Ambulatory Visit: Payer: Self-pay

## 2022-05-05 NOTE — Progress Notes (Deleted)
Entered in error

## 2022-05-05 NOTE — Progress Notes (Signed)
Enteeed in error

## 2022-05-06 ENCOUNTER — Other Ambulatory Visit: Payer: Self-pay

## 2022-05-06 NOTE — Progress Notes (Signed)
GYNECOLOGY OFFICE VISIT NOTE  History:   [name] [name] is a 36 y.o. G1P1001 here today for consultation for sterilization. She does not want any more children. At this time she has [facility] FA.   She had a mirena in the past but had a traumatic insertion. She otherwise liked the mirena once she had it and had it for the full 6 years.   She has done OCPs in the past but she does not prefer these first and foremost because of impact on her mental health medications - lithium and lamictal. She would prefer medicines that would have less impact on this.   She denies any abnormal vaginal discharge, bleeding, pelvic pain or other concerns.     Past Medical History:  Diagnosis Date   ADHD (attention deficit hyperactivity disorder)    Anxiety    Asthma    Brain fog 06/12/2021   Depression    Phreesia 06/30/2020   GERD (gastroesophageal reflux disease)    Headache    History of kidney stones    IBS (irritable bowel syndrome)    Lower extremity weakness 07/08/2021   Obesity    Pneumonia    Pneumonitis 03/02/2019   Sinusitis, acute 01/01/2021   Weakness 07/08/2021    Past Surgical History:  Procedure Laterality Date   BIOPSY  04/24/2022   Procedure: BIOPSY;  Surgeon: Imogene Burn, MD;  Location: [facility];  Service: Gastroenterology;;   COLONOSCOPY  04/24/2022   Procedure: COLONOSCOPY;  Surgeon: Imogene Burn, MD;  Location: [facility];  Service: Gastroenterology;;   CYSTOSCOPY WITH RETROGRADE PYELOGRAM, URETEROSCOPY AND STENT PLACEMENT Right 12/10/2016   Procedure: CYSTOSCOPY WITH RETROGRADE PYELOGRAM, URETEROSCOPY AND STONE REMOVAL;  Surgeon: Heloise Purpura, MD;  Location: [facility];  Service: Urology;  Laterality: Right;   ESOPHAGOGASTRODUODENOSCOPY (EGD) WITH PROPOFOL N/A 04/24/2022   Procedure: ESOPHAGOGASTRODUODENOSCOPY (EGD) WITH PROPOFOL;  Surgeon: Imogene Burn, MD;  Location: [facility];  Service: Gastroenterology;  Laterality: N/A;   WISDOM TOOTH EXTRACTION  at age  25    The following portions of the patient's history were reviewed and updated as appropriate: allergies, current medications, past family history, past medical history, past social history, past surgical history and problem list.   Health Maintenance:   Due for pap smear  Review of Systems:  Pertinent items noted in HPI and remainder of comprehensive ROS otherwise negative.  Physical Exam:  BP 111/73   Pulse (!) 105   Wt 295 lb 4.8 oz (133.9 kg)   BMI 50.69 kg/m  CONSTITUTIONAL: Well-developed, well-nourished female in no acute distress.  HEENT:  Normocephalic, atraumatic. External right and left ear normal. No scleral icterus.  NECK: Normal range of motion, supple, no masses noted on observation SKIN: No rash noted. Not diaphoretic. No erythema. No pallor. MUSCULOSKELETAL: Normal range of motion. No edema noted. NEUROLOGIC: Alert and oriented to person, place, and time. Normal muscle tone coordination. No cranial nerve deficit noted. PSYCHIATRIC: Normal mood and affect. Normal behavior. Normal judgment and thought content.  PELVIC: Deferred  Labs and Imaging No results found for this or any previous visit (from the past 168 hour(s)). SLEEP STUDY DOCUMENTS  Result Date: 04/25/2022 Ordered by an unspecified provider.   Assessment and Plan:  [name] was seen today for tubal removal discussion .  Diagnoses and all orders for this visit:  Unwanted fertility - She desires permanent sterilization. Discussed alternatives including LARC options and vasectomy. At this time, once insurance or if [facility] FA would cover it, she  would want salpingectomy.  - Discussed surgery of salpingectomy vs tubal ligation. She would like to do a salpingectomy.  - Risks of surgery include but are not limited to: bleeding, infection, injury to surrounding organs/tissues (i.e. bowel/bladder/ureters), need for additional procedures, wound complications, hospital re-admission, and conversion to open surgery,  VTE - Reviewed restrictions and recovery following surgery  Birth control counseling - Reviewed different types of birth control available: OCPs, patch, vaginal ring, Nexplanon, Depo, various types of IUDs, permanent sterilization.  We reviewed the advantages and risks of each (particularly risk of VTE with estrogen containing options). We discussed side effects of each. - Patient has tried: IUD and OCPs - Patient desires:  likely IUD . We discussed cervical block to help with pain relief. We will check on her [facility] - if she has outpt then we will do scheduled IUD insertion with IUD from the office. If she does not, then we can have her order from [place].   Routine preventative health maintenance measures emphasized. She will be scheduled with [facility] for pap smear.  Please refer to After Visit Summary for other counseling recommendations.   Return in about 1 month (around 06/09/2022) for IUD insertion if she wishes to proceed.  Radene Gunning, MD, [place] for [facility], [place]

## 2022-05-09 ENCOUNTER — Encounter: Payer: Self-pay | Admitting: Obstetrics and Gynecology

## 2022-05-09 ENCOUNTER — Ambulatory Visit (INDEPENDENT_AMBULATORY_CARE_PROVIDER_SITE_OTHER): Payer: Self-pay | Admitting: Obstetrics and Gynecology

## 2022-05-09 ENCOUNTER — Other Ambulatory Visit: Payer: Self-pay

## 2022-05-09 VITALS — BP 111/73 | HR 105 | Wt 295.3 lb

## 2022-05-09 DIAGNOSIS — Z01419 Encounter for gynecological examination (general) (routine) without abnormal findings: Secondary | ICD-10-CM

## 2022-05-09 DIAGNOSIS — Z3009 Encounter for other general counseling and advice on contraception: Secondary | ICD-10-CM

## 2022-05-09 MED ORDER — ETONOGESTREL-ETHINYL ESTRADIOL 0.12-0.015 MG/24HR VA RING
VAGINAL_RING | VAGINAL | 3 refills | Status: DC
  Filled 2022-05-09: qty 1, 28d supply, fill #0

## 2022-05-09 NOTE — Addendum Note (Signed)
Addended by: Radene Gunning A on: 05/09/2022 12:28 PM   Modules accepted: Orders

## 2022-05-14 ENCOUNTER — Other Ambulatory Visit: Payer: Self-pay

## 2022-05-15 ENCOUNTER — Telehealth: Payer: Self-pay | Admitting: Physician Assistant

## 2022-05-15 DIAGNOSIS — B379 Candidiasis, unspecified: Secondary | ICD-10-CM

## 2022-05-15 DIAGNOSIS — T3695XA Adverse effect of unspecified systemic antibiotic, initial encounter: Secondary | ICD-10-CM

## 2022-05-15 DIAGNOSIS — R6889 Other general symptoms and signs: Secondary | ICD-10-CM

## 2022-05-15 DIAGNOSIS — R42 Dizziness and giddiness: Secondary | ICD-10-CM

## 2022-05-15 MED ORDER — AZITHROMYCIN 250 MG PO TABS: ORAL_TABLET | ORAL | 0 refills | Status: DC

## 2022-05-15 MED ORDER — PREDNISONE 10 MG (21) PO TBPK: ORAL_TABLET | ORAL | 0 refills | Status: DC

## 2022-05-15 MED ORDER — MECLIZINE HCL 25 MG PO TABS: 25.0000 mg | ORAL_TABLET | Freq: Three times a day (TID) | ORAL | 0 refills | Status: DC | PRN

## 2022-05-15 MED ORDER — FLUCONAZOLE 150 MG PO TABS: 150.0000 mg | ORAL_TABLET | ORAL | 0 refills | Status: DC | PRN

## 2022-05-15 NOTE — Patient Instructions (Signed)
[name] [name], thank you for joining Mar Daring, PA-C for today's virtual visit.  While this provider is not your primary care provider (PCP), if your PCP is located in our provider database this encounter information will be shared with them immediately following your visit.  Consent: (Patient) [name] [name] provided verbal consent for this virtual visit at the beginning of the encounter.  Current Medications:  Current Outpatient Medications:    azithromycin (ZITHROMAX) 250 MG tablet, Take 2 tablets on day 1, then 1 tablet daily on days 2 through 5, Disp: 6 tablet, Rfl: 0   fluconazole (DIFLUCAN) 150 MG tablet, Take 1 tablet (150 mg total) by mouth every 3 (three) days as needed., Disp: 2 tablet, Rfl: 0   meclizine (ANTIVERT) 25 MG tablet, Take 1 tablet (25 mg total) by mouth 3 (three) times daily as needed for dizziness., Disp: 30 tablet, Rfl: 0   predniSONE (STERAPRED UNI-PAK 21 TAB) 10 MG (21) TBPK tablet, 6 day taper; take as directed on package instructions, Disp: 21 tablet, Rfl: 0   acetaminophen (TYLENOL) 500 MG tablet, Take 1,000 mg by mouth every 6 (six) hours as needed for moderate pain., Disp: , Rfl:    albuterol (VENTOLIN HFA) 108 (90 Base) MCG/ACT inhaler, Inhale 2 puffs into the lungs every 6 (six) hours as needed for wheezing or shortness of breath. (Patient taking differently: Inhale 2 puffs into the lungs 2 (two) times daily as needed (Asthma).), Disp: 18 g, Rfl: 1   ALPRAZolam (XANAX) 0.5 MG tablet, Take 0.25-0.5 mg by mouth every 6 (six) hours as needed for anxiety., Disp: , Rfl:    ARIPiprazole (ABILIFY) 5 MG tablet, take 1 tablet by mouth daily in the morning (Patient not taking: Reported on 05/09/2022), Disp: 30 tablet, Rfl: 1   cholecalciferol (VITAMIN D3) 25 MCG (1000 UNIT) tablet, Take 1,000 Units by mouth daily., Disp: , Rfl:    cyanocobalamin (VITAMIN B12) 500 MCG tablet, Take 500 mcg by mouth daily., Disp: , Rfl:    escitalopram (LEXAPRO) 20 MG tablet,  Take 1 tablet (20 mg total) by mouth daily. (Patient not taking: Reported on 04/18/2022), Disp: 60 tablet, Rfl: 3   escitalopram (LEXAPRO) 20 MG tablet, Take one tablet by mouth everyday (Patient taking differently: Take 20 mg by mouth at bedtime.), Disp: 90 tablet, Rfl: 0   esomeprazole (NEXIUM) 40 MG capsule, Take 1 capsule (40 mg total) by mouth 2 (two) times daily before a meal., Disp: 60 capsule, Rfl: 1   etonogestrel-ethinyl estradiol (NUVARING) 0.12-0.015 MG/24HR vaginal ring, Insert vaginally and leave in place for 3 consecutive weeks, then remove for 1 week., Disp: 3 each, Rfl: 3   fluticasone (FLONASE) 50 MCG/ACT nasal spray, Place into both nostrils daily., Disp: , Rfl:    fluticasone furoate-vilanterol (BREO ELLIPTA) 100-25 MCG/ACT AEPB, Inhale 1 puff into the lungs daily., Disp: 60 each, Rfl: 11   guaiFENesin (MUCINEX) 600 MG 12 hr tablet, Take 600 mg by mouth daily as needed (congestion)., Disp: , Rfl:    lamoTRIgine (LAMICTAL) 200 MG tablet, Take 1 tablet (200 mg total) by mouth at bedtime., Disp: 90 tablet, Rfl: 2   lamoTRIgine (LAMICTAL) 200 MG tablet, Take 1 oral tablet at bedtime (if out of lamictal more than 1 wk do not restart, call MD), Disp: 90 tablet, Rfl: 0   lithium carbonate (LITHOBID) 300 MG CR tablet, Take 3 tablets by mouth once nightly at bedtime. (Patient not taking: Reported on 04/18/2022), Disp: 270 tablet, Rfl: 0  lithium carbonate (LITHOBID) 300 MG CR tablet, Take 3 tablets at bedtime, Disp: 270 tablet, Rfl: 0   loratadine (CLARITIN) 10 MG tablet, Take 10 mg by mouth daily., Disp: , Rfl:    modafinil (PROVIGIL) 200 MG tablet, Take 200 mg by mouth every morning., Disp: , Rfl:    montelukast (SINGULAIR) 10 MG tablet, Take 1 tablet (10 mg total) by mouth at bedtime., Disp: 180 tablet, Rfl: 0   pramipexole (MIRAPEX) 1.5 MG tablet, Take 1 tablet by mouth at bedtime, Disp: 90 tablet, Rfl: 0   Medications ordered in this encounter:  Meds ordered this encounter   Medications   azithromycin (ZITHROMAX) 250 MG tablet    Sig: Take 2 tablets on day 1, then 1 tablet daily on days 2 through 5    Dispense:  6 tablet    Refill:  0    Order Specific Question:   Supervising Provider    Answer:   Merrilee Jansky [[id number]]   predniSONE (STERAPRED UNI-PAK 21 TAB) 10 MG (21) TBPK tablet    Sig: 6 day taper; take as directed on package instructions    Dispense:  21 tablet    Refill:  0    Order Specific Question:   Supervising Provider    Answer:   Merrilee Jansky [[id number]]   meclizine (ANTIVERT) 25 MG tablet    Sig: Take 1 tablet (25 mg total) by mouth 3 (three) times daily as needed for dizziness.    Dispense:  30 tablet    Refill:  0    Order Specific Question:   Supervising Provider    Answer:   Merrilee Jansky [[id number]]   fluconazole (DIFLUCAN) 150 MG tablet    Sig: Take 1 tablet (150 mg total) by mouth every 3 (three) days as needed.    Dispense:  2 tablet    Refill:  0    Order Specific Question:   Supervising Provider    Answer:   Merrilee Jansky [id number]     *If you need refills on other medications prior to your next appointment, please contact your pharmacy*  Follow-Up: Call back or seek an in-person evaluation if the symptoms worsen or if the condition fails to improve as anticipated.  [facility] [phone]  Other Instructions  Weakness Weakness is a lack of strength. You may feel weak all over your body (generalized), or you may feel weak in one part of your body (focal). Common causes of weakness include: Infection and disorders of the body's defense system (immune system). Physical exhaustion. Internal bleeding or other blood loss that results in a lack of red blood cells (anemia). Dehydration. An imbalance in mineral (electrolyte) levels, such as potassium. Chronic kidney or liver disease. Cancer. Other causes include: Some medicines or cancer treatment. Stress, anxiety, or depression. Heart  disease, circulation problems, or stroke. Nervous system disorders. Thyroid disorders. Loss of muscle strength because of age or inactivity. Poor sleep quality or sleep disorders. The cause of your weakness may not be known. Some causes of weakness can be serious, so it is important to see your health care provider. Follow these instructions at home: Activity Rest as needed. Try to get enough sleep. Most adults need 7-8 hours of quality sleep each night. Talk to your health care provider about how much sleep you need. Do exercises, such as arm curls and leg raises, for 30 minutes at least 2 days a week or as told by your health care provider.  This helps build muscle strength. Consider working with a physical therapist or trainer who can develop an exercise plan to help you gain muscle strength. General instructions  Take over-the-counter and prescription medicines only as told by your health care provider. Eat a healthy, well-balanced diet. This includes: Proteins to build muscles, such as lean meats and fish. Fresh fruits and vegetables. Carbohydrates to boost energy, such as whole grains. Drink enough fluid to keep your urine pale yellow. Keep all follow-up visits. This is important. Contact a health care provider if: Your weakness does not improve or gets worse. Your weakness affects your ability to think clearly. Your weakness affects your ability to do your normal daily activities. Get help right away if: You develop sudden weakness, especially on one side of your face or body. You have chest pain. You have trouble breathing or shortness of breath. You have problems with your vision. You have trouble talking or swallowing. You have trouble standing or walking. You are light-headed or lose consciousness. These symptoms may be an emergency. Get help right away. Call 911. Do not wait to see if the symptoms will go away. Do not drive yourself to the hospital. Summary Weakness is  a lack of strength. You may feel weak all over your body or just in one specific part of your body. Weakness can be caused by a variety of things. In some cases, the cause may be unknown. Rest as needed, and try to get enough sleep. Most adults need 7-8 hours of quality sleep each night. Eat a healthy, well-balanced diet. This information is not intended to replace advice given to you by your health care provider. Make sure you discuss any questions you have with your health care provider. Document Revised: 06/23/2021 Document Reviewed: 06/23/2021 Elsevier Patient Education  2023 Elsevier Inc.    If you have been instructed to have an in-person evaluation today at a local Urgent Care facility, please use the link below. It will take you to a list of all of our available [facility], including address, phone number and hours of operation. Please do not delay care.  [facility]  If you or a family member do not have a primary care provider, use the link below to schedule a visit and establish care. When you choose a [facility] primary care physician or advanced practice provider, you gain a long-term partner in health. Find a Primary Care Provider  Learn more about [facility]'s in-office and virtual care options:  - Get Care Now

## 2022-05-15 NOTE — Progress Notes (Signed)
Virtual Visit Consent   [name] [name], you are scheduled for a virtual visit with a [facility] provider today. Just as with appointments in the office, your consent must be obtained to participate. Your consent will be active for this visit and any virtual visit you may have with one of our providers in the next 365 days. If you have a MyChart account, a copy of this consent can be sent to you electronically.  As this is a virtual visit, video technology does not allow for your provider to perform a traditional examination. This may limit your provider's ability to fully assess your condition. If your provider identifies any concerns that need to be evaluated in person or the need to arrange testing (such as labs, EKG, etc.), we will make arrangements to do so. Although advances in technology are sophisticated, we cannot ensure that it will always work on either your end or our end. If the connection with a video visit is poor, the visit may have to be switched to a telephone visit. With either a video or telephone visit, we are not always able to ensure that we have a secure connection.  By engaging in this virtual visit, you consent to the provision of healthcare and authorize for your insurance to be billed (if applicable) for the services provided during this visit. Depending on your insurance coverage, you may receive a charge related to this service.  I need to obtain your verbal consent now. Are you willing to proceed with your visit today? [name] [name] has provided verbal consent on 05/15/2022 for a virtual visit (video or telephone). Margaretann Loveless, PA-C  Date: 05/15/2022 9:32 AM  Virtual Visit via Video Note   I, Margaretann Loveless, connected with  [name] [name]  ([id number], 1986/02/12) on 05/15/22 at  9:15 AM EDT by a video-enabled telemedicine application and verified that I am speaking with the correct person using two identifiers.  Location: Patient: Virtual Visit  Location Patient: Home Provider: Virtual Visit Location Provider: Home Office   I discussed the limitations of evaluation and management by telemedicine and the availability of in person appointments. The patient expressed understanding and agreed to proceed.    History of Present Illness: [name] [name] is a 36 y.o. who identifies as a female who was assigned female at birth, and is being seen today for flu-like symptoms.  HPI: Influenza This is a new problem. The current episode started 1 to 4 weeks ago. The problem occurs constantly. The problem has been gradually worsening. Associated symptoms include arthralgias, chills, coughing (normal cough with asthma), diaphoresis (nighttime), fatigue, a fever (low grade), headaches, myalgias, vertigo (with changing positions) and weakness. Pertinent negatives include no congestion, nausea, neck pain, sore throat or vomiting. Associated symptoms comments: Urinary frequency. She has tried NSAIDs, lying down and drinking for the symptoms. The treatment provided no relief.     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   Asthma, moderate  persistent 03/02/2019   Other allergic rhinitis 03/02/2019   Mold suspected exposure 03/02/2019    Allergies:  Allergies  Allergen Reactions   Onion Other (See Comments)    migraines    Other Swelling    Bananas, kiwi Swelling in mouth and throat   Wellbutrin [Bupropion] Hives    Medications:  Current Outpatient Medications:    azithromycin (ZITHROMAX) 250 MG tablet, Take 2 tablets on day 1, then 1 tablet daily on days 2 through 5, Disp: 6 tablet, Rfl: 0   fluconazole (DIFLUCAN) 150 MG tablet, Take 1 tablet (150 mg total) by mouth every 3 (three) days as needed., Disp: 2 tablet, Rfl: 0   meclizine (ANTIVERT) 25 MG tablet, Take 1 tablet (25 mg total) by mouth 3 (three) times daily as needed for dizziness., Disp: 30 tablet, Rfl: 0   predniSONE (STERAPRED UNI-PAK 21 TAB) 10 MG (21) TBPK tablet, 6 day taper; take as directed on package instructions, Disp: 21 tablet, Rfl: 0   acetaminophen (TYLENOL) 500 MG tablet, Take 1,000 mg by mouth every 6 (six) hours as needed for moderate pain., Disp: , Rfl:    albuterol (VENTOLIN HFA) 108 (90 Base) MCG/ACT inhaler, Inhale 2 puffs into the lungs every 6 (six) hours as needed for wheezing or shortness of breath. (Patient taking differently: Inhale 2 puffs into the lungs 2 (two) times daily as needed (Asthma).), Disp: 18 g, Rfl: 1   ALPRAZolam (XANAX) 0.5 MG tablet, Take 0.25-0.5 mg by mouth every 6 (six) hours as needed for anxiety., Disp: , Rfl:    ARIPiprazole (ABILIFY) 5 MG tablet, take 1 tablet by mouth daily in the morning (Patient not taking: Reported on 05/09/2022), Disp: 30 tablet, Rfl: 1   cholecalciferol (VITAMIN D3) 25 MCG (1000 UNIT) tablet, Take 1,000 Units by mouth daily., Disp: , Rfl:    cyanocobalamin (VITAMIN B12) 500 MCG tablet, Take 500 mcg by mouth daily., Disp: , Rfl:    escitalopram (LEXAPRO) 20 MG tablet, Take 1 tablet (20 mg total) by mouth daily. (Patient not taking: Reported on 04/18/2022), Disp: 60 tablet, Rfl: 3   escitalopram (LEXAPRO) 20 MG tablet, Take one tablet by mouth everyday (Patient taking differently: Take 20 mg by mouth at bedtime.), Disp: 90 tablet, Rfl: 0   esomeprazole (NEXIUM) 40 MG capsule, Take 1 capsule (40 mg total) by mouth 2 (two) times daily before a meal., Disp: 60 capsule, Rfl: 1    etonogestrel-ethinyl estradiol (NUVARING) 0.12-0.015 MG/24HR vaginal ring, Insert vaginally and leave in place for 3 consecutive weeks, then remove for 1 week., Disp: 3 each, Rfl: 3   fluticasone (FLONASE) 50 MCG/ACT nasal spray, Place into both nostrils daily., Disp: , Rfl:    fluticasone furoate-vilanterol (BREO ELLIPTA) 100-25 MCG/ACT AEPB, Inhale 1 puff into the lungs daily., Disp: 60 each, Rfl: 11   guaiFENesin (MUCINEX) 600 MG 12 hr tablet, Take 600 mg by mouth daily as needed (congestion)., Disp: , Rfl:    lamoTRIgine (LAMICTAL) 200 MG tablet, Take 1 tablet (200 mg total) by mouth at bedtime., Disp: 90 tablet, Rfl: 2   lamoTRIgine (LAMICTAL) 200 MG tablet, Take 1 oral tablet at bedtime (if out of lamictal more than 1 wk do not restart, call MD), Disp: 90 tablet, Rfl: 0   lithium carbonate (LITHOBID) 300 MG CR tablet, Take 3 tablets by mouth once nightly at bedtime. (Patient not taking: Reported on 04/18/2022), Disp: 270 tablet, Rfl: 0   lithium carbonate (LITHOBID) 300 MG CR tablet, Take 3 tablets at bedtime,  Disp: 270 tablet, Rfl: 0   loratadine (CLARITIN) 10 MG tablet, Take 10 mg by mouth daily., Disp: , Rfl:    modafinil (PROVIGIL) 200 MG tablet, Take 200 mg by mouth every morning., Disp: , Rfl:    montelukast (SINGULAIR) 10 MG tablet, Take 1 tablet (10 mg total) by mouth at bedtime., Disp: 180 tablet, Rfl: 0   pramipexole (MIRAPEX) 1.5 MG tablet, Take 1 tablet by mouth at bedtime, Disp: 90 tablet, Rfl: 0  Observations/Objective: Patient is well-developed, well-nourished in no acute distress.  Resting comfortably at home.  Head is normocephalic, atraumatic.  No labored breathing.  Speech is clear and coherent with logical content.  Patient is alert and oriented at baseline.    Assessment and Plan: 1. Flu-like symptoms - azithromycin (ZITHROMAX) 250 MG tablet; Take 2 tablets on day 1, then 1 tablet daily on days 2 through 5  Dispense: 6 tablet; Refill: 0 - predniSONE (STERAPRED  UNI-PAK 21 TAB) 10 MG (21) TBPK tablet; 6 day taper; take as directed on package instructions  Dispense: 21 tablet; Refill: 0  2. Dizziness - meclizine (ANTIVERT) 25 MG tablet; Take 1 tablet (25 mg total) by mouth 3 (three) times daily as needed for dizziness.  Dispense: 30 tablet; Refill: 0  3. Antibiotic-induced yeast infection - fluconazole (DIFLUCAN) 150 MG tablet; Take 1 tablet (150 mg total) by mouth every 3 (three) days as needed.  Dispense: 2 tablet; Refill: 0  - History of viral myositis last year with similar symptoms - Testing last year essentially unremarkable - Will treat with Azithromycin for possible atypical bacterial pneumonia type infection since shortness of breath and mild cough with body aches and weakness are main symptoms and symptoms progressive over the last week - Prednisone added for possible inflammatory response like last year  - Meclizine provided for as needed dizziness - Diflucan given as prophylaxis as patient tends to get vaginal yeast infections with antibiotic use - Push fluids - Rest - Seek in person evaluation for consideration of lab work and re-evaluation if not improving - Seek immediate evaluation if symptoms worsen at all  Follow Up Instructions: I discussed the assessment and treatment plan with the patient. The patient was provided an opportunity to ask questions and all were answered. The patient agreed with the plan and demonstrated an understanding of the instructions.  A copy of instructions were sent to the patient via MyChart unless otherwise noted below.    The patient was advised to call back or seek an in-person evaluation if the symptoms worsen or if the condition fails to improve as anticipated.  Time:  I spent 18 minutes with the patient via telehealth technology discussing the above problems/concerns.    Mar Daring, PA-C

## 2022-05-20 ENCOUNTER — Encounter: Payer: Self-pay | Admitting: Pulmonary Disease

## 2022-05-20 ENCOUNTER — Ambulatory Visit (INDEPENDENT_AMBULATORY_CARE_PROVIDER_SITE_OTHER): Payer: No Typology Code available for payment source | Admitting: Pulmonary Disease

## 2022-05-20 ENCOUNTER — Encounter: Payer: Self-pay | Admitting: Obstetrics and Gynecology

## 2022-05-20 VITALS — BP 122/78 | HR 87 | Ht 64.0 in | Wt 297.0 lb

## 2022-05-20 DIAGNOSIS — J4540 Moderate persistent asthma, uncomplicated: Secondary | ICD-10-CM

## 2022-05-20 DIAGNOSIS — R0683 Snoring: Secondary | ICD-10-CM

## 2022-05-20 NOTE — Progress Notes (Addendum)
[name] [name]    [id number]    [birth year]  Primary Care Physician:Wright, [name], MD  Referring Physician: [name], MD [address] [place],  [place] [address]  Chief complaint:   Patient being seen for sleepiness  HPI:  Patient with a history of daytime sleepiness Difficulty driving secondary to sleepiness she falls asleep easily during activities  Usually goes to bed between 1 and 2 AM Falls asleep maybe 10 minutes Usually does not wake up in the middle of the night Final wake up time about 8 AM Weight is up about 35 pounds  Has had home sleep studies in the past that have been negative  History of asthma, history of allergies  She does use Breo, Symbicort, Flonase, albuterol as needed  She has musculoskeletal pain and discomfort Only able to walk about a couple of blocks before she gets short of breath Has muscle aches Has chronic fatigue  Eating disorder  She does have gasping episodes at night Occasional night sweats  She had COVID March 2020  Never smoker  History of anxiety/stress  On multiple medications, on antidepressants  Pets: Occupation: Exposures: Smoking history: Travel history: Relevant family history:  Outpatient Encounter Medications as of 05/20/2022  Medication Sig   acetaminophen (TYLENOL) 500 MG tablet Take 1,000 mg by mouth every 6 (six) hours as needed for moderate pain.   albuterol (VENTOLIN HFA) 108 (90 Base) MCG/ACT inhaler Inhale 2 puffs into the lungs every 6 (six) hours as needed for wheezing or shortness of breath. (Patient taking differently: Inhale 2 puffs into the lungs 2 (two) times daily as needed (Asthma).)   ALPRAZolam (XANAX) 0.5 MG tablet Take 0.25-0.5 mg by mouth every 6 (six) hours as needed for anxiety.   ARIPiprazole (ABILIFY) 5 MG tablet take 1 tablet by mouth daily in the morning   cholecalciferol (VITAMIN D3) 25 MCG (1000 UNIT) tablet Take 1,000 Units by mouth daily.    cyanocobalamin (VITAMIN B12) 500 MCG tablet Take 500 mcg by mouth daily.   escitalopram (LEXAPRO) 20 MG tablet Take 1 tablet (20 mg total) by mouth daily.   escitalopram (LEXAPRO) 20 MG tablet Take one tablet by mouth everyday (Patient taking differently: Take 20 mg by mouth at bedtime.)   esomeprazole (NEXIUM) 40 MG capsule Take 1 capsule (40 mg total) by mouth 2 (two) times daily before a meal.   etonogestrel-ethinyl estradiol (NUVARING) 0.12-0.015 MG/24HR vaginal ring Insert vaginally and leave in place for 3 consecutive weeks, then remove for 1 week.   fluconazole (DIFLUCAN) 150 MG tablet Take 1 tablet (150 mg total) by mouth every 3 (three) days as needed.   fluticasone (FLONASE) 50 MCG/ACT nasal spray Place into both nostrils daily.   fluticasone furoate-vilanterol (BREO ELLIPTA) 100-25 MCG/ACT AEPB Inhale 1 puff into the lungs daily.   guaiFENesin (MUCINEX) 600 MG 12 hr tablet Take 600 mg by mouth daily as needed (congestion).   lamoTRIgine (LAMICTAL) 200 MG tablet Take 1 oral tablet at bedtime (if out of lamictal more than 1 wk do not restart, call MD)   lithium carbonate (LITHOBID) 300 MG CR tablet Take 3 tablets by mouth once nightly at bedtime.   lithium carbonate (LITHOBID) 300 MG CR tablet Take 3 tablets at bedtime   loratadine (CLARITIN) 10 MG tablet Take 10 mg by mouth daily.   meclizine (ANTIVERT) 25 MG tablet Take 1 tablet (25 mg total) by mouth 3 (three) times daily as  needed for dizziness.   modafinil (PROVIGIL) 200 MG tablet Take 200 mg by mouth every morning.   montelukast (SINGULAIR) 10 MG tablet Take 1 tablet (10 mg total) by mouth at bedtime.   pramipexole (MIRAPEX) 1.5 MG tablet Take 1 tablet by mouth at bedtime   [DISCONTINUED] azithromycin (ZITHROMAX) 250 MG tablet Take 2 tablets on day 1, then 1 tablet daily on days 2 through 5   [DISCONTINUED] predniSONE (STERAPRED UNI-PAK 21 TAB) 10 MG (21) TBPK tablet 6 day taper; take as directed on package instructions    lamoTRIgine (LAMICTAL) 200 MG tablet Take 1 tablet (200 mg total) by mouth at bedtime.   No facility-administered encounter medications on file as of 05/20/2022.    Allergies as of 05/20/2022 - Review Complete 05/20/2022  Allergen Reaction Noted   Wellbutrin [bupropion] Hives 11/17/2016   Onion Other (See Comments) 01/07/2022   Other Swelling 06/29/2020    Past Medical History:  Diagnosis Date   ADHD (attention deficit hyperactivity disorder)    Anxiety    Asthma    Brain fog 06/12/2021   Depression    Phreesia 06/30/2020   GERD (gastroesophageal reflux disease)    Headache    History of kidney stones    IBS (irritable bowel syndrome)    Lower extremity weakness 07/08/2021   Obesity    Pneumonia    Pneumonitis 03/02/2019   Sinusitis, acute 01/01/2021   Weakness 07/08/2021    Past Surgical History:  Procedure Laterality Date   BIOPSY  04/24/2022   Procedure: BIOPSY;  Surgeon: Imogene Burn, MD;  Location: [facility];  Service: Gastroenterology;;   COLONOSCOPY  04/24/2022   Procedure: COLONOSCOPY;  Surgeon: Imogene Burn, MD;  Location: [facility];  Service: Gastroenterology;;   CYSTOSCOPY WITH RETROGRADE PYELOGRAM, URETEROSCOPY AND STENT PLACEMENT Right 12/10/2016   Procedure: CYSTOSCOPY WITH RETROGRADE PYELOGRAM, URETEROSCOPY AND STONE REMOVAL;  Surgeon: Heloise Purpura, MD;  Location: [facility];  Service: Urology;  Laterality: Right;   ESOPHAGOGASTRODUODENOSCOPY (EGD) WITH PROPOFOL N/A 04/24/2022   Procedure: ESOPHAGOGASTRODUODENOSCOPY (EGD) WITH PROPOFOL;  Surgeon: Imogene Burn, MD;  Location: [facility];  Service: Gastroenterology;  Laterality: N/A;   WISDOM TOOTH EXTRACTION  at age 62    Family History  Problem Relation Age of Onset   Depression Mother    Anxiety disorder Mother    Stomach cancer Neg Hx    Colon cancer Neg Hx    Esophageal cancer Neg Hx     Social History   Socioeconomic History   Marital status: Significant Other    Spouse name: Not on  file   Number of children: 0   Years of education: Not on file   Highest education level: Not on file  Occupational History   Occupation: artist  Tobacco Use   Smoking status: Never   Smokeless tobacco: Never  Vaping Use   Vaping Use: Never used  Substance and Sexual Activity   Alcohol use: Never    Alcohol/week: 1.0 standard drink of alcohol    Types: 1 Glasses of wine per week    Comment: per week   Drug use: No   Sexual activity: Yes    Birth control/protection: None, Condom  Other Topics Concern   Not on file  Social History Narrative   Not on file   Social Determinants of Health   Financial Resource Strain: Not on file  Food Insecurity: Not on file  Transportation Needs: Not on file  Physical Activity: Not on file  Stress: Not  on file  Social Connections: Not on file  Intimate Partner Violence: Not on file    Review of Systems  Constitutional:  Positive for fatigue.  Respiratory:  Positive for shortness of breath.     Vitals:   05/20/22 1517  BP: 122/78  Pulse: 87  SpO2: 100%     Physical Exam Constitutional:      Appearance: She is obese.  HENT:     Head: Normocephalic.     Mouth/Throat:     Mouth: Mucous membranes are moist.     Comments: Mallampati 2, crowded Cardiovascular:     Rate and Rhythm: Normal rate and regular rhythm.     Heart sounds: No murmur heard.    No friction rub.  Pulmonary:     Effort: No respiratory distress.     Breath sounds: No stridor. No wheezing or rhonchi.  Musculoskeletal:     Cervical back: No rigidity or tenderness.  Neurological:     Mental Status: She is alert.  Psychiatric:        Mood and Affect: Mood normal.       05/20/2022    3:00 PM  Results of the Epworth flowsheet  Sitting and reading 2  Watching TV 2  Sitting, inactive in a public place (e.g. a theatre or a meeting) 2  As a passenger in a car for an hour without a break 2  Lying down to rest in the afternoon when circumstances permit 3   Sitting and talking to someone 1  Sitting quietly after a lunch without alcohol 2  In a car, while stopped for a few minutes in traffic 1  Total score 15   Data Reviewed: Recent home sleep study reviewed showing negative study for significant obstructive sleep apnea 04/25/2022  Assessment:   Excessive daytime sleepiness  Possibility of obstructive sleep apnea was discussed with patient  Questions regarding narcolepsy was discussed -Diagnosis will be difficult to make if patient cannot be safely taken off antidepressants that may contribute to REM suppression   Plan/Recommendations: Schedule patient for an in lab polysomnogram to assess for significant sleep disordered breathing  Inhaler technique was reviewed  Encouraged to continue Breo  Schedule patient for pulmonary function test  May consider changing to another inhaler example be Trelegy if Breo not controlling symptoms or continued use of albuterol on a regular basis  Had had allergy testing a couple years back that were negative  Encouraged to make follow-up appointment with neurology for further evaluation of chronic fatigue musculoskeletal pain and discomfort   Virl Diamond MD [facility] 05/20/2022, 4:57 PM  CC: Storm Frisk, MD

## 2022-05-20 NOTE — Patient Instructions (Signed)
Schedule for in lab polysomnogram  Continue Breo presented today  Albuterol as needed  Follow-up in about 8 weeks  Call with significant concerns    Pulmonary function test

## 2022-05-21 ENCOUNTER — Telehealth: Payer: Self-pay | Admitting: Obstetrics and Gynecology

## 2022-05-21 NOTE — Telephone Encounter (Signed)
Patient called, she want to go forward with scheduling her surgery

## 2022-05-22 NOTE — Telephone Encounter (Signed)
Called patient and she states she wants to proceed with having a tubal. Patient states she has coverage until 08/18/22 through [facility]. Told patient I would reach out to Dr Damita Dunnings and let her know. Patient verbalized understanding.

## 2022-05-23 ENCOUNTER — Other Ambulatory Visit: Payer: Self-pay

## 2022-05-23 MED ORDER — LAMOTRIGINE 200 MG PO TABS
200.0000 mg | ORAL_TABLET | Freq: Every evening | ORAL | 0 refills | Status: DC
  Filled 2022-05-23: qty 90, 90d supply, fill #0

## 2022-05-23 MED ORDER — PRAMIPEXOLE DIHYDROCHLORIDE 1.5 MG PO TABS
1.5000 mg | ORAL_TABLET | Freq: Every evening | ORAL | 0 refills | Status: DC
  Filled 2022-05-23: qty 90, 90d supply, fill #0

## 2022-05-23 MED ORDER — ARIPIPRAZOLE 5 MG PO TABS
5.0000 mg | ORAL_TABLET | Freq: Every morning | ORAL | 1 refills | Status: DC
  Filled 2022-05-23: qty 30, 30d supply, fill #0

## 2022-05-23 MED ORDER — ESCITALOPRAM OXALATE 20 MG PO TABS
20.0000 mg | ORAL_TABLET | Freq: Every day | ORAL | 0 refills | Status: DC
  Filled 2022-05-23: qty 90, 90d supply, fill #0

## 2022-05-26 ENCOUNTER — Other Ambulatory Visit: Payer: Self-pay

## 2022-06-02 ENCOUNTER — Other Ambulatory Visit: Payer: Self-pay

## 2022-06-05 ENCOUNTER — Other Ambulatory Visit: Payer: Self-pay | Admitting: Critical Care Medicine

## 2022-06-05 MED ORDER — VITAMIN D3 25 MCG (1000 UNIT) PO TABS: 1000.0000 [IU] | ORAL_TABLET | Freq: Every day | ORAL | 0 refills | Status: DC

## 2022-06-05 MED ORDER — CYANOCOBALAMIN 500 MCG PO TABS: 500.0000 ug | ORAL_TABLET | Freq: Every day | ORAL | 0 refills | Status: DC

## 2022-06-05 MED ORDER — MONTELUKAST SODIUM 10 MG PO TABS: 10.0000 mg | ORAL_TABLET | Freq: Every day | ORAL | 2 refills | Status: DC

## 2022-06-05 NOTE — Telephone Encounter (Signed)
Medication Refill - Medication: montelukast (SINGULAIR) 10 MG tablet [   cyanocobalamin (VITAMIN B12) 500 MCG tablet  cholecalciferol (VITAMIN D3) 25 MCG   Has the patient contacted their pharmacy? Yes.   (Agent: If no, request that the patient contact the pharmacy for the refill. If patient does not wish to contact the pharmacy document the reason why and proceed with request.) (Agent: If yes, when and what did the pharmacy advise?)  Preferred Pharmacy (with phone number or street name):   [facility], [place], [place]  [address], [place] [address]  Phone: [phone] Fax: [phone]   Has the patient been seen for an appointment in the last year OR does the patient have an upcoming appointment? Yes.    Agent: Please be advised that RX refills may take up to 3 business days. We ask that you follow-up with your pharmacy.

## 2022-06-05 NOTE — Telephone Encounter (Signed)
Requested medication (s) are due for refill today: Amount not specified  Requested medication (s) are on the active medication list: yes    Last refill: both meds  04/18/22  Amount not specified  Future visit scheduled yes  07/03/22  Notes to clinic:Historical provider, please review. Thank you.  Requested Prescriptions  Pending Prescriptions Disp Refills   cholecalciferol (VITAMIN D3) 25 MCG (1000 UNIT) tablet      Sig: Take 1 tablet (1,000 Units total) by mouth daily.     Endocrinology:  Vitamins - Vitamin D Supplementation 2 Failed - 06/05/2022  1:26 PM      Failed - Manual Review: Route requests for 50,000 IU strength to the provider      Failed - Vitamin D in normal range and within 360 days    Vit D, 25-Hydroxy  Date Value Ref Range Status  07/08/2021 28.90 (L) 30 - 100 ng/mL Final    Comment:    (NOTE) Vitamin D deficiency has been defined by the [place] practice guideline as a level of serum 25-OH  vitamin D less than 20 ng/mL (1,2). The Endocrine Society went on to  further define vitamin D insufficiency as a level between 21 and 29  ng/mL (2).  1. IOM (Institute of Medicine). 2010. Dietary reference intakes for  calcium and D. [place]: The [employer]. 2. Holick MF, [place], Bischoff-Ferrari HA, et al. Evaluation,  treatment, and prevention of vitamin D deficiency: an Endocrine  Society clinical practice guideline, JCEM. 2011 Jul; 96(7): 1911-30.  Performed at [facility], [place] [address], [place], [place] [address]          Passed - Ca in normal range and within 360 days    Calcium  Date Value Ref Range Status  02/25/2022 9.7 8.4 - 10.5 mg/dL Final         Passed - Valid encounter within last 12 months    Recent Outpatient Visits           3 months ago Chronic diarrhea   [place] [name], MD   4 months ago Diarrhea of presumed infectious origin    [place], Patrick E, MD   5 months ago Encounter for other general counseling or advice on contraception   Primary Care at [facility], Cari S, PA-C   7 months ago Class 3 severe obesity due to excess calories without serious comorbidity with body mass index (BMI) of 45.0 to 49.9 in adult [facility])   [place] [name], MD   10 months ago Weakness   [place], Dionne Bucy, [place]       Future Appointments             In 4 weeks [name], MD [place]   In 1 month Olalere, Ernesto Rutherford, MD [facility]             cyanocobalamin (VITAMIN B12) 500 MCG tablet      Sig: Take 1 tablet (500 mcg total) by mouth daily.     Endocrinology:  Vitamins - Vitamin B12 Passed - 06/05/2022  1:26 PM      Passed - HCT in normal range and within 360 days    HCT  Date Value Ref Range Status  02/25/2022 38.8 36.0 -  46.0 % Final   Hematocrit  Date Value Ref Range Status  01/07/2022 36.9 34.0 - 46.6 % Final         Passed - HGB in normal range and within 360 days    Hemoglobin  Date Value Ref Range Status  02/25/2022 12.7 12.0 - 15.0 g/dL Final  01/07/2022 12.2 11.1 - 15.9 g/dL Final         Passed - B12 Level in normal range and within 360 days    Vitamin B-12  Date Value Ref Range Status  07/01/2021 360 180 - 914 pg/mL Final    Comment:    (NOTE) This assay is not validated for testing neonatal or myeloproliferative syndrome specimens for Vitamin B12 levels. Performed at [facility], [place] [address] [place], [place] [address]          Passed - Valid encounter within last 12 months    Recent Outpatient Visits           3 months ago Chronic diarrhea   [employer] [name], MD   4 months ago Diarrhea of presumed infectious origin   [place], Patrick E, MD   5 months ago Encounter for other general counseling or advice on contraception   Primary Care at [facility], Cari S, PA-C   7 months ago Class 3 severe obesity due to excess calories without serious comorbidity with body mass index (BMI) of 45.0 to 49.9 in adult [facility])   [place] [name], MD   10 months ago Weakness   [place], Dionne Bucy, [place]       Future Appointments             In 4 weeks [name], MD [place]   In 1 month Olalere, Ernesto Rutherford, MD [facility]            Signed Prescriptions Disp Refills   montelukast (SINGULAIR) 10 MG tablet 90 tablet 2    Sig: Take 1 tablet (10 mg total) by mouth at bedtime.     Pulmonology:  Leukotriene Inhibitors Passed - 06/05/2022  1:26 PM      Passed - Valid encounter within last 12 months    Recent Outpatient Visits           3 months ago Chronic diarrhea   [place] [name], MD   4 months ago Diarrhea of presumed infectious origin   [place], Patrick E, MD   5 months ago Encounter for other general counseling or advice on contraception   Primary Care at [facility], Cari S, PA-C   7 months ago Class 3 severe obesity due to excess calories without serious comorbidity with body mass index (BMI) of 45.0 to 49.9 in adult [facility])   [place] [name], MD   10 months ago Weakness   [place], Dionne Bucy, [place]       Future Appointments             In 4 weeks Joya Gaskins [name], MD [place]   In 1 month Laurin Coder, MD [facility]

## 2022-06-05 NOTE — Telephone Encounter (Signed)
Requested Prescriptions  Pending Prescriptions Disp Refills   montelukast (SINGULAIR) 10 MG tablet 180 tablet 0    Sig: Take 1 tablet (10 mg total) by mouth at bedtime.     Pulmonology:  Leukotriene Inhibitors Passed - 06/05/2022  1:26 PM      Passed - Valid encounter within last 12 months    Recent Outpatient Visits           3 months ago Chronic diarrhea   [place] [name], MD   4 months ago Diarrhea of presumed infectious origin   [place], Patrick E, MD   5 months ago Encounter for other general counseling or advice on contraception   Primary Care at [facility], Cari S, PA-C   7 months ago Class 3 severe obesity due to excess calories without serious comorbidity with body mass index (BMI) of 45.0 to 49.9 in adult [facility])   [place] [name], MD   10 months ago Weakness   [place], Dionne Bucy, [place]       Future Appointments             In 4 weeks [name], MD [place]   In 1 month Olalere, Ernesto Rutherford, MD [place] [facility]             cholecalciferol (VITAMIN D3) 25 MCG (1000 UNIT) tablet      Sig: Take 1 tablet (1,000 Units total) by mouth daily.     Endocrinology:  Vitamins - Vitamin D Supplementation 2 Failed - 06/05/2022  1:26 PM      Failed - Manual Review: Route requests for 50,000 IU strength to the provider      Failed - Vitamin D in normal range and within 360 days    Vit D, 25-Hydroxy  Date Value Ref Range Status  07/08/2021 28.90 (L) 30 - 100 ng/mL Final    Comment:    (NOTE) Vitamin D deficiency has been defined by the [place] practice guideline as a level of serum 25-OH  vitamin D less than 20 ng/mL (1,2). The Endocrine Society went on to  further define vitamin D insufficiency as a level between 21  and 29  ng/mL (2).  1. IOM (Institute of Medicine). 2010. Dietary reference intakes for  calcium and D. [place]: The [employer]. 2. Holick MF, Bink[place], Bischoff-Ferrari HA, et al. Evaluation,  treatment, and prevention of vitamin D deficiency: an Endocrine  Society clinical practice guideline, JCEM. 2011 Jul; 96(7): 1911-30.  Performed at [facility], [place] [address] [place], [place] [address]          Passed - Ca in normal range and within 360 days    Calcium  Date Value Ref Range Status  02/25/2022 9.7 8.4 - 10.5 mg/dL Final         Passed - Valid encounter within last 12 months    Recent Outpatient Visits           3 months ago Chronic diarrhea   [place] [name], MD   4 months ago Diarrhea of presumed infectious origin   [place], MD   5 months ago Encounter for other general counseling or advice on contraception  Primary Care at [facility], Cari S, PA-C   7 months ago Class 3 severe obesity due to excess calories without serious comorbidity with body mass index (BMI) of 45.0 to 49.9 in adult [facility])   [place] [facility] Storm Frisk, MD   10 months ago Weakness   [facility] [place], Marzella Schlein, [place]       Future Appointments             In 4 weeks Storm Frisk, MD [facility]   In 1 month Olalere, Minna Antis, MD [place] [facility]             cyanocobalamin (VITAMIN B12) 500 MCG tablet      Sig: Take 1 tablet (500 mcg total) by mouth daily.     Endocrinology:  Vitamins - Vitamin B12 Passed - 06/05/2022  1:26 PM      Passed - HCT in normal range and within 360 days    HCT  Date Value Ref Range Status  02/25/2022 38.8 36.0 - 46.0 % Final   Hematocrit  Date Value Ref Range Status  01/07/2022 36.9 34.0 - 46.6 % Final          Passed - HGB in normal range and within 360 days    Hemoglobin  Date Value Ref Range Status  02/25/2022 12.7 12.0 - 15.0 g/dL Final  44/10/4740 59.5 11.1 - 15.9 g/dL Final         Passed - B12 Level in normal range and within 360 days    Vitamin B-12  Date Value Ref Range Status  07/01/2021 360 180 - 914 pg/mL Final    Comment:    (NOTE) This assay is not validated for testing neonatal or myeloproliferative syndrome specimens for Vitamin B12 levels. Performed at [facility], [address] [place], [place] [address]          Passed - Valid encounter within last 12 months    Recent Outpatient Visits           3 months ago Chronic diarrhea   [place] [facility] Storm Frisk, MD   4 months ago Diarrhea of presumed infectious origin   [facility] Storm Frisk, MD   5 months ago Encounter for other general counseling or advice on contraception   Primary Care at [facility], Cari S, PA-C   7 months ago Class 3 severe obesity due to excess calories without serious comorbidity with body mass index (BMI) of 45.0 to 49.9 in adult [facility])   [facility] Storm Frisk, MD   10 months ago Weakness   [facility] [place], Marzella Schlein, [place]       Future Appointments             In 4 weeks Delford Field Charlcie Cradle, MD [facility]   In 1 month Tomma Lightning, MD [facility]

## 2022-06-19 ENCOUNTER — Ambulatory Visit (HOSPITAL_BASED_OUTPATIENT_CLINIC_OR_DEPARTMENT_OTHER): Payer: No Typology Code available for payment source | Attending: Pulmonary Disease | Admitting: Pulmonary Disease

## 2022-06-19 DIAGNOSIS — R0683 Snoring: Secondary | ICD-10-CM | POA: Insufficient documentation

## 2022-06-24 ENCOUNTER — Telehealth: Payer: Self-pay | Admitting: Family Medicine

## 2022-06-24 NOTE — Telephone Encounter (Signed)
Patient is ready to get her BTL surgery

## 2022-06-24 NOTE — Telephone Encounter (Signed)
Called pt. Verified she desires BTL. Does not wish to proceed with IUD. States she has [employer] and was told this will cover BTL. Will forward to provider for review.

## 2022-06-30 NOTE — Telephone Encounter (Signed)
[name] Hock, MD consulted with [name] Nipple, PA, their recommendation is for patient to be scheduled for BTL. Para March states we can have further discussion with billing department after scheduling. Provider asks that patient verify she has inpatient financial assistance as well at outpatient financial assistance. Message sent to surgery scheduler [name] [name] who states Para March, MD does not have any remaining OR time. Recommends patient be scheduled with [name] Foil, MD who has OR time as soon as 08/19/22. Front office notified to schedule; appt made for 08/05/22. Called pt to review; VM left.   Called pt a second time. Relayed all information. Pt agreeable to appt with [name] Foil, MD on 08/05/22. Pt will contact Financial department to reapply for financial assistance and to determine if she has inpatient and outpatient assistance. Explained that anesthesia is provided by [facility]. Pt states she believes this practice was used for her colonoscopy. Patient will contact their practice to ask about payment information.

## 2022-07-03 ENCOUNTER — Ambulatory Visit: Payer: Self-pay | Attending: Critical Care Medicine | Admitting: Critical Care Medicine

## 2022-07-03 ENCOUNTER — Encounter: Payer: Self-pay | Admitting: Critical Care Medicine

## 2022-07-03 VITALS — BP 126/81 | HR 100 | Ht 64.0 in | Wt 298.2 lb

## 2022-07-03 DIAGNOSIS — F411 Generalized anxiety disorder: Secondary | ICD-10-CM | POA: Insufficient documentation

## 2022-07-03 DIAGNOSIS — G479 Sleep disorder, unspecified: Secondary | ICD-10-CM

## 2022-07-03 DIAGNOSIS — R1013 Epigastric pain: Secondary | ICD-10-CM

## 2022-07-03 DIAGNOSIS — J4540 Moderate persistent asthma, uncomplicated: Secondary | ICD-10-CM

## 2022-07-03 DIAGNOSIS — G9331 Postviral fatigue syndrome: Secondary | ICD-10-CM

## 2022-07-03 DIAGNOSIS — Z3009 Encounter for other general counseling and advice on contraception: Secondary | ICD-10-CM

## 2022-07-03 DIAGNOSIS — Z6841 Body Mass Index (BMI) 40.0 and over, adult: Secondary | ICD-10-CM

## 2022-07-03 DIAGNOSIS — K2951 Unspecified chronic gastritis with bleeding: Secondary | ICD-10-CM

## 2022-07-03 DIAGNOSIS — G5603 Carpal tunnel syndrome, bilateral upper limbs: Secondary | ICD-10-CM | POA: Insufficient documentation

## 2022-07-03 DIAGNOSIS — K529 Noninfective gastroenteritis and colitis, unspecified: Secondary | ICD-10-CM

## 2022-07-03 DIAGNOSIS — Z23 Encounter for immunization: Secondary | ICD-10-CM

## 2022-07-03 DIAGNOSIS — E6609 Other obesity due to excess calories: Secondary | ICD-10-CM

## 2022-07-03 MED ORDER — ESOMEPRAZOLE MAGNESIUM 40 MG PO CPDR: 40.0000 mg | DELAYED_RELEASE_CAPSULE | Freq: Two times a day (BID) | ORAL | 1 refills | Status: DC

## 2022-07-03 MED ORDER — MONTELUKAST SODIUM 10 MG PO TABS: 10.0000 mg | ORAL_TABLET | Freq: Every day | ORAL | 2 refills | Status: DC

## 2022-07-03 MED ORDER — FLUTICASONE FUROATE-VILANTEROL 100-25 MCG/ACT IN AEPB: 1.0000 | INHALATION_SPRAY | Freq: Every day | RESPIRATORY_TRACT | 11 refills | Status: DC

## 2022-07-03 NOTE — Assessment & Plan Note (Signed)
Patient being assessed for IUD also needs a Pap smear

## 2022-07-03 NOTE — Assessment & Plan Note (Signed)
Carpal tunnel symptoms will refer to orthopedics

## 2022-07-03 NOTE — Assessment & Plan Note (Signed)
This has resolved.

## 2022-07-03 NOTE — Assessment & Plan Note (Signed)
Awaiting sleep study result

## 2022-07-03 NOTE — Assessment & Plan Note (Signed)
Moderate persistent asthma stable at this time continue inhalers

## 2022-07-03 NOTE — Patient Instructions (Signed)
Will ask gastroenterology to follow-up with you  Remember to get a Pap smear with gynecology  Flu vaccine was given  Repeat autoimmune inflammatory labs obtained at this visit and referral to neurology made for chronic fatigue syndrome  Referral to orthopedics made for bilateral carpal tunnel syndrome  Follow a healthier plant-based diet as we have recommended before  Refills on all of your nonpsychiatric medicines sent to the pharmacy  Return to see Dr. Delford Field 4 months

## 2022-07-03 NOTE — Assessment & Plan Note (Signed)
Patient had COVID in 2020 she has had chronic fatigue since that time she also had a viral illness last year that cause neuromuscular weakness will reassess C-reactive protein and Epstein-Barr viral titers and refer to neurology

## 2022-07-03 NOTE — Assessment & Plan Note (Signed)
This appears to be improved on Nexium also has reflux disease patient given reflux diet needs follow-up with GI

## 2022-07-03 NOTE — Progress Notes (Signed)
Established Patient Office Visit  Subjective:  Patient ID: [name] [name], female    DOB: 1986/05/06  Age: 36 y.o. MRN: [id number]  CC:  Chief Complaint  Patient presents with   Asthma   Fatigue    HPI 07/08/21 [name] [name] presents for post emergency room follow-up.  Patient was in the emergency room between the 28th and 29 November with lower extremity weakness.  Below is documentation from that emergency room visit  36 yo female presets with lower extremity weakness.  Patient has had URI symptoms and was treated for sinusitis with Augmentin.  [name] [name] felt better from the ninth of the 19th when she was on the Augmentin.  She was off of it over the weekend and had some fevers.  She has now noted difficulty walking.  She feels that she is weak and she has to shuffle.  She has difficulty standing up from a seated position.  She has also had cough with this.  She has a history of asthma.  She has had some difficulty breathing and has had to have her asthma medications adjusted.  She does not feel that she is having trouble with her feet or ankles.  She denies any numbness.  Patient was initially seen and evaluated at urgent care which had a chest x-ray done, with minimal basilar atelectasis, normal sed rate, normal CBC and negative POC flu and mono testing.  She is then seen here and screened.  MR of the head and neck were ordered.  Neurology was consulted via phone.  She has not had her MRI and is to be reevaluated. 36 year old female with recent febrile illness, also recently on antibiotics who presents today with proximal leg weakness difficulty walking.   Patient with lower extremity weakness.  Concern for [name] discussed with Dr. Cheral Marker.  Plan admission to medicine for observation and mr of thoracic and lumbar spine. Patient now wishes to leave.  I have discussed risks with patient including respiratory distress and arrest.  We have discussed return precautions.  We discussed  that she can return at any time if she is feeling worse.  They do live within the city and her husband will be with her. Pt left AMA  needs Thoracic /lumbar MRI  The patient did leave [place] was going to be admitted for observation and have additional testing for potential Guillain-Barr.  The patient is self-pay.  The patient states her symptoms have progressed.  She now has to use a Rollator to walk.  She has extreme weakness in her lower extremities and also now in her upper arm.  She has sensory changes in her feet as well.  She has severe muscle aches in the lower extremity.  He has difficulty sleeping and low-grade temperatures to 100 degrees.  She has loose stools.  Patient also has difficulty taking a deep breath.  She states there is a rash over the back of her neck.  She did take the 50 mg prednisone given to her in the emergency room and finished this without any change in symptoms.  3/27 Was sent to ED last Ov and admitted as noted below Date of Admission: 07/08/2021                      Date of Discharge: 07/09/2021 Admitting Physician: [name] [name]   Primary Care Provider: [name], [name] Consultants: Neurology   Indication for Hospitalization: Weakness   Discharge Diagnoses/Problem List:  Principal Problem:   Weakness   Disposition: Home with HH PT   Discharge Condition: Stable   Discharge Exam: From same day progress note: General: well appearing, NAD  Cardiovascular: regular rate and rhythm, no murmur appreciated.  Respiratory: clear, no wheezing or crackles  Abdomen: soft, non-tender  Extremities: no peripheral edema  Neuro: Strength 5/5 bilaterally in UE and LE. No focal deficits, no facial drooping, slurred speech, changes in vision.    Brief Hospital Course:  [name] A Zatarain is a 36 y.o. female who presented with 10 days of weakness and myalgias. PMH is significant for moderate persistent asthma, Bipolar I disorder, chronic allergic  rhinitis, Class 3 obesity. Her hospital course is outlined below:    Weakness  Muscular Tenderness  Body Aches Patient presented with 10 days of weakness, body aches, and diffuse muscular tenderness. Initial workup with MRI of brain and spine were unremarkable with no evidence of demyelinating disease. Patient had intact reflexes and isolated muscle groups appeared to have intact strength. Presentation was less concerning for [name] Syndrome. Initial labs showed CK 33, CRP 2.0, sed rate 8, cortisol am 9.8, and no electrolyte abnormalities. Neurology began workup for rheumatologic disease, with several labs pending at the time of discharge (see below). If rheumatologic workup negative, differential includes inflammatory myositis vs fibromyalgia vs functional disorder. Neuro recommended outpatient follow up with Dr. Narda [name] in 2-4 weeks. Patient was evaluated by physical therapy and occupational therapy who recommended outpatient PT.    Items for PCP Follow-up: Follow up with psychiatry for bipolar disorder to optimize treatment.  Follow up on autoimmune lab results.  Outpatient PT follow up.  Follow up with Neurology, Dr. 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  Her neurologic work-up otherwise was negative.  She has no autoimmune conditions on work-up.  Patient has had several rounds of physical therapy ending in January.  Patient is feeling better in this regard.  She feels like she is back to baseline.  She continues to follow-up with her mental health provider for her bipolar disorder.  She  needs a lithium level checked and her Lamictal refilled at this visit.  Patient's been using albuterol quite a bit recently.  She has a dry cough that wakes her up at night.  She denies any reflux symptoms.  She has significant rhinitis and nasal drainage.  She tends to sleep with her mouth open because her nose is obstructed.  Note patient does need a Pap smear.  Her pisq suggest that she does not want to have further children and is interested in exploring birth control measures.  Note she is self-pay.  She does have the orange card.  Because of elevated blood sugars in the hospital she would like an A1c repeated.   6/6 Patient returns complaining of episodes of diarrhea nausea and emesis.  The emesis is watery and the diarrhea has been chronic for 3 weeks.  She will go multiple times during the day.  She is trying to follow a vegan diet and she does drink occasional dairy milk.  She had extensive allergy work-up in 2021 with no evidence of food allergy seen.  She does have some intolerances to certain dairy products.  She is never been told she is lactose intolerant.  Patient recently started on Provigil per psychiatry.  She is on a 200 mg dose which is associated with diarrhea and nausea.  Asthma is stable.  She is having some allergies during the spring season.  7/27 Patient seen in return follow-up she has had a slight cough for 2 days some lower extremity edema she is a vegan and does eat processed vegetarian foods.  Patient is requesting a sleep study as well.  Note she has a gynecology appointment on August 21 for Pap smear.  She is reapplying for the orange card.  She has follow-up appoint with gastroenterology for evaluation of her diarrhea and chronic abdominal pain.  She is seeing GI already lab studies have been unrevealing.  She will need a both an upper and endoscopy and colonoscopy.  Asthma has been stable during the heat of the summer.  She still has loose stools at this time.  She is  taking Protonix twice daily now.  11/30 Patient seen in return follow-up she had been evaluated by gastroenterology she had diarrhea and abdominal pain found to have reflux and gastritis and negative colonoscopy.  She has been placed on Nexium and taken off Protonix and is doing better.  She is changed her diet as well.  She does have a chronic fatigue type syndrome and has had elevated C-reactive protein in the past.  She had a neuromuscular condition last year that she slowly recovered from that was unclear etiology.  She would like to see neurology in follow-up.  She also has bilateral carpal tunnel type symptoms.  On arrival blood pressure is good 126/81.  She still has excess weight with elevated BMI 51.  She is a vegan and tries to eat healthy but does eat a lot of processed foods There is a question of narcolepsy and sleep apnea she been seen by sleep medicine there and unsure of that diagnosis of narcolepsy but her psychiatrist has her on Provigil.  The patient also is following with psychiatry her mental health medicines have been adjusted.  The patient did have a sleep study has not yet been read.  Past Medical History:  Diagnosis Date   ADHD (attention deficit hyperactivity disorder)    Anxiety    Asthma    Brain fog 06/12/2021   Chronic diarrhea 01/26/2022   Depression    Phreesia 06/30/2020   Epigastric pain 01/07/2022   GERD (gastroesophageal reflux disease)    Headache    History of kidney stones    IBS (irritable bowel syndrome)    Lower extremity weakness 07/08/2021   Obesity    Pneumonia    Pneumonitis 03/02/2019   Sinusitis, acute 01/01/2021   Weakness 07/08/2021    Past Surgical History:  Procedure Laterality Date   BIOPSY  04/24/2022   Procedure: BIOPSY;  Surgeon: Sharyn Creamer, [name];  Location: [facility];  Service: Gastroenterology;;   COLONOSCOPY  04/24/2022   Procedure: COLONOSCOPY;  Surgeon: Sharyn Creamer, [name];  Location: [facility];  Service:  Gastroenterology;;   CYSTOSCOPY WITH RETROGRADE PYELOGRAM, URETEROSCOPY AND STENT PLACEMENT Right 12/10/2016   Procedure: CYSTOSCOPY WITH RETROGRADE PYELOGRAM, URETEROSCOPY AND STONE REMOVAL;  Surgeon:  Raynelle Bring, [name];  Location: [facility];  Service: Urology;  Laterality: Right;   ESOPHAGOGASTRODUODENOSCOPY (EGD) WITH PROPOFOL N/A 04/24/2022   Procedure: ESOPHAGOGASTRODUODENOSCOPY (EGD) WITH PROPOFOL;  Surgeon: Sharyn Creamer, [name];  Location: [facility];  Service: Gastroenterology;  Laterality: N/A;   WISDOM TOOTH EXTRACTION  at age 36    Family History  Problem Relation Age of Onset   Depression Mother    Anxiety disorder Mother    Stomach cancer Neg Hx    Colon cancer Neg Hx    Esophageal cancer Neg Hx     Social History   Socioeconomic History   Marital status: Significant Other    Spouse name: Not on file   Number of children: 0   Years of education: Not on file   Highest education level: Not on file  Occupational History   Occupation: artist  Tobacco Use   Smoking status: Never   Smokeless tobacco: Never  Vaping Use   Vaping Use: Never used  Substance and Sexual Activity   Alcohol use: Never    Alcohol/week: 1.0 standard drink of alcohol    Types: 1 Glasses of wine per week    Comment: per week   Drug use: No   Sexual activity: Yes    Birth control/protection: None, Condom  Other Topics Concern   Not on file  Social History Narrative   Not on file   Social Determinants of Health   Financial Resource Strain: Not on file  Food Insecurity: Not on file  Transportation Needs: Not on file  Physical Activity: Not on file  Stress: Not on file  Social Connections: Not on file  Intimate Partner Violence: Not on file    Outpatient Medications Prior to Visit  Medication Sig Dispense Refill   ALPRAZolam (XANAX) 0.5 MG tablet Take 0.25-0.5 mg by mouth every 6 (six) hours as needed for anxiety.     ARIPiprazole (ABILIFY) 5 MG tablet Take 1 tablet (5 mg total) by mouth  every morning. 30 tablet 1   cholecalciferol (VITAMIN D3) 25 MCG (1000 UNIT) tablet Take 1 tablet (1,000 Units total) by mouth daily. 30 tablet 0   cyanocobalamin (VITAMIN B12) 500 MCG tablet Take 1 tablet (500 mcg total) by mouth daily. 30 tablet 0   fluticasone (FLONASE) 50 MCG/ACT nasal spray Place into both nostrils daily.     lithium carbonate 300 MG capsule Take 300 mg by mouth 2 (two) times daily with a meal.     loratadine (CLARITIN) 10 MG tablet Take 10 mg by mouth daily.     modafinil (PROVIGIL) 200 MG tablet Take 200 mg by mouth every morning.     pramipexole (MIRAPEX) 1.5 MG tablet Take 1 tablet (1.5 mg total) by mouth at bedtime. 90 tablet 0   esomeprazole (NEXIUM) 40 MG capsule Take 1 capsule (40 mg total) by mouth 2 (two) times daily before a meal. 60 capsule 1   fluticasone furoate-vilanterol (BREO ELLIPTA) 100-25 MCG/ACT AEPB Inhale 1 puff into the lungs daily. 60 each 11   montelukast (SINGULAIR) 10 MG tablet Take 1 tablet (10 mg total) by mouth at bedtime. 90 tablet 2   albuterol (VENTOLIN HFA) 108 (90 Base) MCG/ACT inhaler Inhale 2 puffs into the lungs every 6 (six) hours as needed for wheezing or shortness of breath. (Patient not taking: Reported on 07/03/2022) 18 g 1   acetaminophen (TYLENOL) 500 MG tablet Take 1,000 mg by mouth every 6 (six) hours as needed for moderate pain.  escitalopram (LEXAPRO) 20 MG tablet Take 1 tablet (20 mg total) by mouth daily. 60 tablet 3   escitalopram (LEXAPRO) 20 MG tablet Take one tablet by mouth everyday (Patient taking differently: Take 20 mg by mouth at bedtime.) 90 tablet 0   escitalopram (LEXAPRO) 20 MG tablet Take 1 tablet (20 mg total) by mouth daily. 90 tablet 0   etonogestrel-ethinyl estradiol (NUVARING) 0.12-0.015 MG/24HR vaginal ring Insert vaginally and leave in place for 3 consecutive weeks, then remove for 1 week. 3 each 3   fluconazole (DIFLUCAN) 150 MG tablet Take 1 tablet (150 mg total) by mouth every 3 (three) days as  needed. 2 tablet 0   guaiFENesin (MUCINEX) 600 MG 12 hr tablet Take 600 mg by mouth daily as needed (congestion).     lamoTRIgine (LAMICTAL) 200 MG tablet Take 1 tablet (200 mg total) by mouth at bedtime. 90 tablet 2   lamoTRIgine (LAMICTAL) 200 MG tablet Take 1 oral tablet at bedtime (if out of lamictal more than 1 wk do not restart, call [name]) 90 tablet 0   lamoTRIgine (LAMICTAL) 200 MG tablet Take 1 tablet (200 mg total) by mouth at bedtime.(if out of lamictal more than 1 wk do not restart, call [name]) 90 tablet 0   lithium carbonate (LITHOBID) 300 MG CR tablet Take 3 tablets by mouth once nightly at bedtime. 270 tablet 0   lithium carbonate (LITHOBID) 300 MG CR tablet Take 3 tablets at bedtime 270 tablet 0   meclizine (ANTIVERT) 25 MG tablet Take 1 tablet (25 mg total) by mouth 3 (three) times daily as needed for dizziness. 30 tablet 0   No facility-administered medications prior to visit.    Allergies  Allergen Reactions   Wellbutrin [Bupropion] Hives   Onion Other (See Comments)    migraines    Other Swelling    Bananas, kiwi Swelling in mouth and throat    ROS Review of Systems  Constitutional:  Positive for fatigue. Negative for chills, diaphoresis and fever.  HENT:  Negative for congestion, ear discharge, ear pain, hearing loss, nosebleeds, postnasal drip, rhinorrhea, sinus pressure, sinus pain, sneezing, sore throat and tinnitus.   Eyes:  Negative for photophobia, discharge, redness and itching.  Respiratory:  Negative for cough, chest tightness, shortness of breath, wheezing and stridor.   Cardiovascular:  Negative for chest pain, palpitations and leg swelling.  Gastrointestinal:  Negative for abdominal pain, blood in stool, constipation, diarrhea, nausea and vomiting.       No heartburn  Endocrine: Negative for polydipsia.  Genitourinary:  Negative for dysuria, flank pain, frequency, hematuria and urgency.  Musculoskeletal:  Negative for gait problem, myalgias and neck pain.   Skin:  Negative for rash.  Allergic/Immunologic: Negative for environmental allergies.  Neurological:  Negative for dizziness, tremors, seizures, weakness and headaches.  Hematological:  Does not bruise/bleed easily.  Psychiatric/Behavioral: Negative.  Negative for suicidal ideas. The patient is not nervous/anxious.       Objective:    Physical Exam Vitals reviewed.  Constitutional:      Appearance: Normal appearance. She is well-developed. She is obese. She is not diaphoretic.  HENT:     Head: Normocephalic and atraumatic.     Nose: No nasal deformity, septal deviation, mucosal edema, congestion or rhinorrhea.     Right Sinus: No maxillary sinus tenderness or frontal sinus tenderness.     Left Sinus: No maxillary sinus tenderness or frontal sinus tenderness.     Mouth/Throat:     Mouth: Mucous membranes are  moist.     Pharynx: Oropharynx is clear. No oropharyngeal exudate.  Eyes:     General: No scleral icterus.    Conjunctiva/sclera: Conjunctivae normal.     Pupils: Pupils are equal, round, and reactive to light.  Neck:     Thyroid: No thyromegaly.     Vascular: No carotid bruit or JVD.     Trachea: Trachea normal. No tracheal tenderness or tracheal deviation.  Cardiovascular:     Rate and Rhythm: Normal rate and regular rhythm.     Chest Wall: PMI is not displaced.     Pulses: Normal pulses. No decreased pulses.     Heart sounds: Normal heart sounds, S1 normal and S2 normal. Heart sounds not distant. No murmur heard.    No systolic murmur is present.     No diastolic murmur is present.     No friction rub. No gallop. No S3 or S4 sounds.  Pulmonary:     Effort: Pulmonary effort is normal. No tachypnea, accessory muscle usage or respiratory distress.     Breath sounds: Normal breath sounds. No stridor. No decreased breath sounds, wheezing, rhonchi or rales.  Chest:     Chest wall: No tenderness.  Abdominal:     General: Bowel sounds are normal. There is no distension.      Palpations: Abdomen is soft. Abdomen is not rigid.     Tenderness: There is no abdominal tenderness. There is no right CVA tenderness, left CVA tenderness, guarding or rebound.  Musculoskeletal:        General: Normal range of motion.     Cervical back: Normal range of motion and neck supple. No edema, erythema or rigidity. No muscular tenderness. Normal range of motion.  Lymphadenopathy:     Head:     Right side of head: No submental or submandibular adenopathy.     Left side of head: No submental or submandibular adenopathy.     Cervical: No cervical adenopathy.  Skin:    General: Skin is warm and dry.     Coloration: Skin is not pale.     Findings: No rash.     Nails: There is no clubbing.  Neurological:     General: No focal deficit present.     Mental Status: She is alert and oriented to person, place, and time.     Cranial Nerves: Cranial nerves 2-12 are intact.     Sensory: No sensory deficit.     Motor: No weakness or abnormal muscle tone.     Gait: Gait and tandem walk normal.  Psychiatric:        Mood and Affect: Mood normal.        Speech: Speech normal.        Behavior: Behavior normal.        Thought Content: Thought content normal.        Judgment: Judgment normal.     BP 126/81   Pulse 100   Ht _0  (1.626 m)   Wt 298 lb 3.2 oz (135.3 kg)   LMP  (LMP Unknown)   SpO2 99%   BMI 51.19 kg/m  Wt Readings from Last 3 Encounters:  07/03/22 298 lb 3.2 oz (135.3 kg)  06/19/22 300 lb (136.1 kg)  05/20/22 297 lb (134.7 kg)    Imaging Results (Last 48 hours)  MR THORACIC SPINE W WO CONTRAST   Result Date: 07/08/2021 CLINICAL DATA:  Motor neuron disease EXAM: MRI THORACIC AND LUMBAR SPINE WITHOUT AND WITH CONTRAST  TECHNIQUE: Multiplanar and multiecho pulse sequences of the thoracic and lumbar spine were obtained without and with intravenous contrast. CONTRAST:  39m GADAVIST GADOBUTROL 1 MMOL/ML IV SOLN COMPARISON:  Chest CT 03/15/2019 FINDINGS: MRI THORACIC  SPINE FINDINGS There is respiratory motion artifact present which mildly degrades image quality on axial sequences. Alignment:  Physiologic. Vertebrae:  No fracture, evidence of discitis, or bone lesion. Cord: No abnormal cord signal.  No abnormal enhancement. Paraspinal and other soft tissues: Negative Disc levels: Evaluation of the disc levels are demonstrates no large disc herniation, significant spinal canal or neural foraminal narrowing. MRI LUMBAR SPINE FINDINGS Segmentation:  Standard. Alignment:  Physiologic. Vertebrae: No fracture, evidence of discitis, or aggressive bone lesion. Conus medullaris and cauda equina: Conus extends to the L1 level. Conus and cauda equina appear normal. No abnormal enhancement. Paraspinal and other soft tissues: Tiny bilateral renal cysts. Disc levels: T12-L1: No significant spinal canal or neural foraminal narrowing. L1-L2: No significant spinal canal or neural foraminal narrowing. L2-L3: No significant spinal canal or neural foraminal narrowing. L3-L4: No significant spinal canal or neural foraminal narrowing. L4-L5: No significant spinal canal or neural foraminal narrowing L5-S1: No significant spinal canal or neural foraminal narrowing. IMPRESSION: Normal MRI of the thoracic and lumbar spine. Electronically Signed   By: JMaurine SimmeringM.D.   On: 07/08/2021 16:45    MR Lumbar Spine W Wo Contrast   Result Date: 07/08/2021 CLINICAL DATA:  Motor neuron disease EXAM: MRI THORACIC AND LUMBAR SPINE WITHOUT AND WITH CONTRAST TECHNIQUE: Multiplanar and multiecho pulse sequences of the thoracic and lumbar spine were obtained without and with intravenous contrast. CONTRAST:  137mGADAVIST GADOBUTROL 1 MMOL/ML IV SOLN COMPARISON:  Chest CT 03/15/2019 FINDINGS: MRI THORACIC SPINE FINDINGS There is respiratory motion artifact present which mildly degrades image quality on axial sequences. Alignment:  Physiologic. Vertebrae:  No fracture, evidence of discitis, or bone lesion. Cord: No  abnormal cord signal.  No abnormal enhancement. Paraspinal and other soft tissues: Negative Disc levels: Evaluation of the disc levels are demonstrates no large disc herniation, significant spinal canal or neural foraminal narrowing. MRI LUMBAR SPINE FINDINGS Segmentation:  Standard. Alignment:  Physiologic. Vertebrae: No fracture, evidence of discitis, or aggressive bone lesion. Conus medullaris and cauda equina: Conus extends to the L1 level. Conus and cauda equina appear normal. No abnormal enhancement. Paraspinal and other soft tissues: Tiny bilateral renal cysts. Disc levels: T12-L1: No significant spinal canal or neural foraminal narrowing. L1-L2: No significant spinal canal or neural foraminal narrowing. L2-L3: No significant spinal canal or neural foraminal narrowing. L3-L4: No significant spinal canal or neural foraminal narrowing. L4-L5: No significant spinal canal or neural foraminal narrowing L5-S1: No significant spinal canal or neural foraminal narrowing. IMPRESSION: Normal MRI of the thoracic and lumbar spine. Electronically Signed   By: JaMaurine Simmering.D.   On: 07/08/2021 16:45      Health Maintenance Due  Topic Date Due   PAP SMEAR-Modifier  08/22/2020   COVID-19 Vaccine (4 - 2023-24 season) 04/04/2022    There are no preventive care reminders to display for this patient.  Lab Results  Component Value Date   TSH 0.932 07/01/2021   Lab Results  Component Value Date   WBC 7.5 02/25/2022   HGB 12.7 02/25/2022   HCT 38.8 02/25/2022   MCV 85.7 02/25/2022   PLT 335.0 02/25/2022   Lab Results  Component Value Date   NA 138 02/25/2022   K 4.2 02/25/2022   CO2 27 02/25/2022   GLUCOSE  99 02/25/2022   BUN 10 02/25/2022   CREATININE 0.81 02/25/2022   BILITOT 0.4 02/25/2022   ALKPHOS 81 02/25/2022   AST 12 02/25/2022   ALT 11 02/25/2022   PROT 6.6 02/25/2022   ALBUMIN 4.4 02/25/2022   CALCIUM 9.7 02/25/2022   ANIONGAP 8 07/08/2021   EGFR 110 01/07/2022   GFR 93.61 02/25/2022    Lab Results  Component Value Date   CHOL 175 01/24/2017   Lab Results  Component Value Date   HDL 43 01/24/2017   Lab Results  Component Value Date   LDLCALC 98 01/24/2017   Lab Results  Component Value Date   TRIG 171 (H) 01/24/2017   Lab Results  Component Value Date   CHOLHDL 4.1 01/24/2017   Lab Results  Component Value Date   HGBA1C 5.3 10/28/2021  MR Brain W and Wo Contrast   Result Date: 07/02/2021 CLINICAL DATA:  Demyelinating disease EXAM: MRI HEAD WITHOUT AND WITH CONTRAST TECHNIQUE: Multiplanar, multiecho pulse sequences of the brain and surrounding structures were obtained without and with intravenous contrast. CONTRAST:  38m GADAVIST GADOBUTROL 1 MMOL/ML IV SOLN COMPARISON:  None. FINDINGS: Brain: No acute infarct, mass effect or extra-axial collection. No acute or chronic hemorrhage. Normal white matter signal, parenchymal volume and CSF spaces. The midline structures are normal. There is no abnormal contrast enhancement. Vascular: Major flow voids are preserved. Skull and upper cervical spine: Normal calvarium and skull base. Visualized upper cervical spine and soft tissues are normal. Sinuses/Orbits:No paranasal sinus fluid levels or advanced mucosal thickening. No mastoid or middle ear effusion. Normal orbits. IMPRESSION: Normal brain MRI. Electronically Signed   By: KUlyses JarredM.D.   On: 07/02/2021 03:31    MR CERVICAL SPINE W WO CONTRAST   Result Date: 07/02/2021 CLINICAL DATA:  Demyelinating disease EXAM: MRI CERVICAL SPINE WITHOUT AND WITH CONTRAST TECHNIQUE: Multiplanar and multiecho pulse sequences of the cervical spine, to include the craniocervical junction and cervicothoracic junction, were obtained without and with intravenous contrast. CONTRAST:  935mGADAVIST GADOBUTROL 1 MMOL/ML IV SOLN COMPARISON:  None. FINDINGS: Alignment: Physiologic. Vertebrae: No fracture, evidence of discitis, or bone lesion. Cord: Normal signal and morphology. Posterior  Fossa, vertebral arteries, paraspinal tissues: Negative. Disc levels: No spinal canal or neural foraminal stenosis. IMPRESSION: Normal MRI of the cervical spine. No evidence of demyelinating disease.     Assessment & Plan:   Problem List Items Addressed This Visit       Cardiovascular and Mediastinum   Hemorrhagic gastritis    This appears to be improved on Nexium also has reflux disease patient given reflux diet needs follow-up with GI        Respiratory   Asthma, moderate persistent    Moderate persistent asthma stable at this time continue inhalers      Relevant Medications   fluticasone furoate-vilanterol (BREO ELLIPTA) 100-25 MCG/ACT AEPB   montelukast (SINGULAIR) 10 MG tablet     Digestive   RESOLVED: Chronic diarrhea    This has resolved        Nervous and Auditory   Bilateral carpal tunnel syndrome    Carpal tunnel symptoms will refer to orthopedics      Relevant Medications   lithium carbonate 300 MG capsule   Other Relevant Orders   Ambulatory referral to Orthopedic Surgery     Other   General counseling and advice for contraceptive management    Patient being assessed for IUD also needs a Pap smear      Sleep  disturbance    Awaiting sleep study result      Generalized anxiety disorder    Management per psychiatry      Postviral fatigue syndrome - Primary    Patient had COVID in 2020 she has had chronic fatigue since that time she also had a viral illness last year that cause neuromuscular weakness will reassess C-reactive protein and Epstein-Barr viral titers and refer to neurology      Relevant Orders   EBV VCA/EA Ab, IgG   Sedimentation Rate   C-reactive protein   CBC with Differential/Platelet   Ambulatory referral to Neurology   RESOLVED: Epigastric pain    Resolved with Nexium      Other Visit Diagnoses     Need for influenza vaccination       Relevant Orders   Flu Vaccine MDCK QUAD PF (Completed)      Meds ordered this  encounter  Medications   esomeprazole (NEXIUM) 40 MG capsule    Sig: Take 1 capsule (40 mg total) by mouth 2 (two) times daily before a meal.    Dispense:  60 capsule    Refill:  1   fluticasone furoate-vilanterol (BREO ELLIPTA) 100-25 MCG/ACT AEPB    Sig: Inhale 1 puff into the lungs daily.    Dispense:  60 each    Refill:  11   montelukast (SINGULAIR) 10 MG tablet    Sig: Take 1 tablet (10 mg total) by mouth at bedtime.    Dispense:  90 tablet    Refill:  2  30 minutes spent extra time needed Follow-up: Return in about 4 months (around 11/01/2022) for chronic conditions.    Asencion Noble, [name]

## 2022-07-03 NOTE — Assessment & Plan Note (Signed)
Management per psychiatry 

## 2022-07-03 NOTE — Assessment & Plan Note (Signed)
Resolved with Nexium. 

## 2022-07-04 ENCOUNTER — Telehealth: Payer: Self-pay | Admitting: Pulmonary Disease

## 2022-07-04 NOTE — Telephone Encounter (Signed)
Spoke with pt and notified of results per Dr. Olalere. Pt verbalized understanding and denied any questions.  

## 2022-07-04 NOTE — Telephone Encounter (Signed)
Call patient  Sleep study result  Date of study: 06/19/2022  Impression: Negative study for significant sleep disordered breathing Snoring  Recommendation:  Follow-up for further evaluation of contributors to excessive daytime sleepiness Optimize sleep hygiene as able Encouraged aggressive weight loss measures  Continue current measures to help daytime sleepiness including stimulants

## 2022-07-04 NOTE — Procedures (Signed)
POLYSOMNOGRAPHY  Last, First: [name], [name] MRN: [id number] Gender: Female Age (years): 36 Weight (lbs): 300 DOB: [birth year] BMI: 51 Primary Care: No PCP Epworth Score: 20 Referring: Tomma Lightning MD Technician: Cherylann Parr Interpreting: Tomma Lightning MD Study Type: NPSG Ordered Study Type: NPSG Study date: 06/19/2022 Location: [facility] CLINICAL INFORMATION [name] [name] is a 36 year old Female and was referred to the sleep center for evaluation of N/A. Indications include Fatigue, Obesity, Snoring, Witnesses Apnea / Gasping During Sleep.  MEDICATIONS Patient self administered medications include: N/A. Medications administered during study include No sleep medicine administered.  SLEEP STUDY TECHNIQUE A multi-channel overnight Polysomnography study was performed. The channels recorded and monitored were central and occipital EEG, electrooculogram (EOG), submentalis EMG (chin), nasal and oral airflow, thoracic and abdominal wall motion, anterior tibialis EMG, snore microphone, electrocardiogram, and a pulse oximetry. TECHNICIAN COMMENTS Comments added by Technician: Patient talked in his/her sleep. Patient had difficulty initiating sleep. Comments added by Scorer: N/A SLEEP ARCHITECTURE The study was initiated at 10:52:20 PM and terminated at 4:59:21 AM. The total recorded time was 367 minutes. EEG confirmed total sleep time was 340.2 minutes yielding a sleep efficiency of 92.7%%. Sleep onset after lights out was 5.8 minutes with a REM latency of 112.0 minutes. The patient spent 2.4%% of the night in stage N1 sleep, 87.7%% in stage N2 sleep, 4.8%% in stage N3 and 5.1% in REM. Wake after sleep onset (WASO) was 21.0 minutes. The Arousal Index was 0.7/hour. RESPIRATORY PARAMETERS There were a total of 8 respiratory disturbances out of which 3 were apneas ( 3 obstructive, 0 mixed, 0 central) and 5 hypopneas. The apnea/hypopnea index (AHI) was 1.4 events/hour. The central  sleep apnea index was 0 events/hour. The REM AHI was 0.0 events/hour and NREM AHI was 1.5 events/hour. The supine AHI was 1.8 events/hour and the non supine AHI was 1 supine during 48.65% of sleep. Respiratory disturbances were associated with oxygen desaturation down to a nadir of 89.0% during sleep. The mean oxygen saturation during the study was 93.3%.   LEG MOVEMENT DATA The total leg movements were 0 with a resulting leg movement index of 0.0/hr .Associated arousal with leg movement index was 0.0/hr.  CARDIAC DATA The underlying cardiac rhythm was most consistent with sinus rhythm. Mean heart rate during sleep was 81.9 bpm. Additional rhythm abnormalities include None.  IMPRESSIONS - No Significant Obstructive Sleep apnea(OSA) - EKG showed no cardiac abnormalities. - The patient snored with moderate snoring volume. - No significant periodic leg movements(PLMs) during sleep. However, no significant associated arousals.  DIAGNOSIS - Negative study for significant sleep disordered breathin - Snoring - Excessive daytime sleepiness  RECOMMENDATIONS - Further evaluation of contributors to excessive daytime sleepiness - Avoid alcohol, sedatives and other CNS depressants that may worsen sleep apnea and disrupt normal sleep architecture. - Sleep hygiene should be reviewed to assess factors that may improve sleep quality. - Weight management and regular exercise should be initiated or continued.  [Electronically signed] 07/04/2022 06:44 AM  Virl Diamond MD NPI: [id number]

## 2022-07-05 LAB — CBC WITH DIFFERENTIAL/PLATELET
Basophils Absolute: 0.1 10*3/uL (ref 0.0–0.2)
Basos: 1 %
EOS (ABSOLUTE): 0.1 10*3/uL (ref 0.0–0.4)
Eos: 1 %
Hematocrit: 39.5 % (ref 34.0–46.6)
Hemoglobin: 13.1 g/dL (ref 11.1–15.9)
Immature Grans (Abs): 0 10*3/uL (ref 0.0–0.1)
Immature Granulocytes: 0 %
Lymphocytes Absolute: 2.1 10*3/uL (ref 0.7–3.1)
Lymphs: 23 %
MCH: 29.1 pg (ref 26.6–33.0)
MCHC: 33.2 g/dL (ref 31.5–35.7)
MCV: 88 fL (ref 79–97)
Monocytes Absolute: 0.7 10*3/uL (ref 0.1–0.9)
Monocytes: 8 %
Neutrophils Absolute: 6.1 10*3/uL (ref 1.4–7.0)
Neutrophils: 67 %
Platelets: 406 10*3/uL (ref 150–450)
RBC: 4.5 x10E6/uL (ref 3.77–5.28)
RDW: 14 % (ref 11.7–15.4)
WBC: 9.1 10*3/uL (ref 3.4–10.8)

## 2022-07-05 LAB — EBV VCA/EA AB, IGG
EBV Early Antigen Ab, IgG: <9 U/mL (ref 0.0–8.9)
EBV VCA IgG: 323 U/mL — ABNORMAL HIGH (ref 0.0–17.9)

## 2022-07-05 LAB — SEDIMENTATION RATE: Sed Rate: 23 mm/hr (ref 0–32)

## 2022-07-05 LAB — C-REACTIVE PROTEIN: CRP: 8 mg/L (ref 0–10)

## 2022-07-07 ENCOUNTER — Encounter: Payer: Self-pay | Admitting: Critical Care Medicine

## 2022-07-07 ENCOUNTER — Telehealth: Payer: Self-pay | Admitting: Emergency Medicine

## 2022-07-07 NOTE — Telephone Encounter (Signed)
Copied from CRM [id number]. Topic: General - Other >> Jul 07, 2022 12:40 PM Everette C wrote: Reason for CRM: The patient would like to be contacted by a member of clinical staff when possible to address their recent lab results  Please contact the patient further when possible

## 2022-07-08 ENCOUNTER — Telehealth: Payer: Self-pay

## 2022-07-08 ENCOUNTER — Ambulatory Visit: Payer: No Typology Code available for payment source | Admitting: Pulmonary Disease

## 2022-07-08 NOTE — Progress Notes (Signed)
Let pt know she has had exposure to epstein barr virus in the past it is not active this causes mononucleosis when younger it is not active and not a cause of her fatigue.  Sleep study was negative the inflammation labs are now normal, no signs of inflammation causing fatigue  Rest of labs normal  recommend healthy lifestyle as discussed

## 2022-07-08 NOTE — Telephone Encounter (Signed)
-----   Message from Storm Frisk, MD sent at 07/08/2022 11:42 AM EST ----- Let pt know she has had exposure to epstein barr virus in the past it is not active this causes mononucleosis when younger it is not active and not a cause of her fatigue.  Sleep study was negative the inflammation labs are now normal, no signs of inflammation causing fatigue  Rest of labs normal  recommend healthy lifestyle as discussed

## 2022-07-08 NOTE — Telephone Encounter (Signed)
Called patient and she is aware of results dob confirmed

## 2022-07-08 NOTE — Telephone Encounter (Signed)
That is from exposure likely years ago, not current or active and not related to current symptoms

## 2022-07-08 NOTE — Telephone Encounter (Signed)
[name] [name] can you connect this pt with Legal aid  needs help getting out of her lease

## 2022-07-08 NOTE — Telephone Encounter (Signed)
Pt was called and is aware of results, DOB was confirmed.  ?

## 2022-07-09 ENCOUNTER — Telehealth: Payer: Self-pay | Admitting: Emergency Medicine

## 2022-07-09 ENCOUNTER — Telehealth: Payer: Self-pay

## 2022-07-09 DIAGNOSIS — Z8669 Personal history of other diseases of the nervous system and sense organs: Secondary | ICD-10-CM

## 2022-07-09 DIAGNOSIS — G9331 Postviral fatigue syndrome: Secondary | ICD-10-CM

## 2022-07-09 NOTE — Telephone Encounter (Signed)
Called patient and she is aware of note   She is wanting to know the next steps?

## 2022-07-09 NOTE — Telephone Encounter (Signed)
Copied from CRM [id number]. Topic: General - Other >> Jul 09, 2022  2:17 PM Everette C wrote: Reason for CRM: The patient has called to follow up on their previous request for additional contact to discuss their EBV VCA/EA Ab, IgG lab results   Please contact the patient further when possible

## 2022-07-09 NOTE — Telephone Encounter (Signed)
Copied from CRM [id number]. Topic: General - Other >> Jul 09, 2022  2:14 PM Everette C wrote: Reason for CRM: The patient has called to request that Dr. Delford Field please write a letter encouraging them to be released from their lease, the patient has contacted legal aid as they were directed to but shares that legal aid was unable to assist further   The patient has requested that the letter be written sharing the patient's difficulty in moving in their two story apartment   Please contact the patient further when possible

## 2022-07-09 NOTE — Telephone Encounter (Signed)
See previous message

## 2022-07-09 NOTE — Telephone Encounter (Signed)
At the request of Dr Delford Field, I called patient to discuss her request for documentation for her landlord. She explained that due to her medical conditions she is not able to safely navigate the stairs in her townhouse.   She has notified the landlord that she will be turning in her keys on 07/21/2022 because she is moving into a new 1 floor apartment that better suits her needs.  The current lease is in effect until 04/2023 and her landlord is refusing to release her from the lease.   She has contacted [employer] [facility] [place] for assistance and was informed that they are not able to help her.   She is requesting a letter from Dr Delford Field addressing the need for her to have a 1 floor residence.

## 2022-07-10 ENCOUNTER — Ambulatory Visit: Payer: No Typology Code available for payment source | Admitting: Pulmonary Disease

## 2022-07-10 ENCOUNTER — Encounter: Payer: Self-pay | Admitting: Orthopaedic Surgery

## 2022-07-10 ENCOUNTER — Ambulatory Visit (INDEPENDENT_AMBULATORY_CARE_PROVIDER_SITE_OTHER): Payer: Self-pay | Admitting: Orthopaedic Surgery

## 2022-07-10 ENCOUNTER — Encounter: Payer: Self-pay | Admitting: Critical Care Medicine

## 2022-07-10 DIAGNOSIS — M654 Radial styloid tenosynovitis [de Quervain]: Secondary | ICD-10-CM

## 2022-07-10 DIAGNOSIS — R202 Paresthesia of skin: Secondary | ICD-10-CM

## 2022-07-10 NOTE — Telephone Encounter (Signed)
No answer when I called  Local neurology refused to see the patient   Tell her I am referring her to [facility]  No sleep apnea  Nothing more I can offer   I will write a letter for landlord  surprised Legal aid will not help Erskine Squibb adding you for assistance on her lease issue needs to change apartment location to easier access

## 2022-07-10 NOTE — Telephone Encounter (Signed)
Called patient and she is aware of note   Letter is at the front

## 2022-07-10 NOTE — Addendum Note (Signed)
Addended by: Wendi Maya on: 07/10/2022 10:24 AM   Modules accepted: Orders

## 2022-07-10 NOTE — Addendum Note (Signed)
Addended by: Storm Frisk on: 07/10/2022 08:21 AM   Modules accepted: Orders

## 2022-07-10 NOTE — Telephone Encounter (Signed)
When I called the patient this afternoon, she reported that she already picked up the letter from Dr Delford Field today.

## 2022-07-10 NOTE — Progress Notes (Signed)
Office Visit Note   Patient: [name] [name]           Date of Birth: September 09, 1985           MRN: [id number] Visit Date: 07/10/2022              Requested by: Storm Frisk, MD [address] [place],  [place] [address] PCP: Storm Frisk, MD   Assessment & Plan: Visit Diagnoses:  1. Paresthesia of hand, bilateral   2. Tenosynovitis, de Quervain     Plan: Impression is bilateral hand paresthesias concerning for carpal tunnel syndrome in addition to mild right de Quervain's tenosynovitis.  Regards to the hand paresthesias, would like to order nerve conduction study.  She will follow-up with [place] once this has been completed.  We have also discussed thumb spica splinting for the right de Quervain's, but she has tried this without relief.  Will hold off on injections for now.  We will decide further treatment when she follows up following the nerve conduction study.  Call with concerns or questions in meantime.  Follow-Up Instructions: Return for after NCS/EMG.   Orders:  No orders of the defined types were placed in this encounter.  No orders of the defined types were placed in this encounter.     Procedures: No procedures performed   Clinical Data: No additional findings.   Subjective: Chief Complaint  Patient presents with   Left Hand - Pain   Right Hand - Pain    HPI patient is a pleasant 36 year old right-hand-dominant female who comes in today with bilateral hand pain and paresthesias right greater than left.  Symptoms have been ongoing for years which is progressively worsened over the past 1 year.  She tells me she does a lot of baking and crocheting which seems to aggravate her symptoms.  She has pain to the base of the right thumb and into the first dorsal compartment with paresthesias primarily into the ring and small fingers.  She does get occasional weakness when she is picking things up.  Symptoms are worse when she is pinching, gripping or when  she is sleeping at night where she will frequently wakes up shaking her hands.  She has tried ibuprofen as well as night splints without relief.  No previous cortisone injections or nerve conduction study.  Review of Systems as detailed in HPI.  All others reviewed and are negative.   Objective: Vital Signs: LMP  (LMP Unknown)   Physical Exam well-developed well-nourished female in no acute distress.  Alert and oriented x 3.  Ortho Exam right hand exam reveals a positive Phalen.  Negative Tinel at the wrist and elbow.  She does have mild tenderness to the first dorsal compartment with a positive Finkelstein test.  No pain or crepitus with grind test of the thumb.  Positive Phalen in the left wrist.  Negative Tinel at the wrist and elbow.  No thenar atrophy either side.  She is neurovascularly intact distally.  Specialty Comments:  No specialty comments available.  Imaging: No new imaging   PMFS History: 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   Sleep disturbance 02/27/2022   Leg edema 02/27/2022   Hemorrhagic gastritis 01/26/2022   General counseling and advice for contraceptive management 10/28/2021   Therapeutic drug monitoring 10/28/2021   Other atopic dermatitis 04/16/2020  Food allergy 01/12/2020   Class 3 severe obesity due to excess calories without serious comorbidity with body mass index (BMI) of 45.0 to 49.9 in adult [facility]) 09/06/2019   Eczema of external ear, 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   Asthma, moderate persistent 03/02/2019   Other allergic rhinitis 03/02/2019   Mold suspected exposure 03/02/2019   Past Medical History:  Diagnosis Date   ADHD (attention deficit hyperactivity disorder)    Anxiety    Asthma    Brain fog 06/12/2021   Chronic diarrhea 01/26/2022   Depression    Phreesia 06/30/2020   Epigastric pain 01/07/2022    GERD (gastroesophageal reflux disease)    Headache    History of kidney stones    IBS (irritable bowel syndrome)    Lower extremity weakness 07/08/2021   Obesity    Pneumonia    Pneumonitis 03/02/2019   Sinusitis, acute 01/01/2021   Weakness 07/08/2021    Family History  Problem Relation Age of Onset   Depression Mother    Anxiety disorder Mother    Stomach cancer Neg Hx    Colon cancer Neg Hx    Esophageal cancer Neg Hx     Past Surgical History:  Procedure Laterality Date   BIOPSY  04/24/2022   Procedure: BIOPSY;  Surgeon: Imogene Burn, MD;  Location: [facility];  Service: Gastroenterology;;   COLONOSCOPY  04/24/2022   Procedure: COLONOSCOPY;  Surgeon: Imogene Burn, MD;  Location: [facility];  Service: Gastroenterology;;   CYSTOSCOPY WITH RETROGRADE PYELOGRAM, URETEROSCOPY AND STENT PLACEMENT Right 12/10/2016   Procedure: CYSTOSCOPY WITH RETROGRADE PYELOGRAM, URETEROSCOPY AND STONE REMOVAL;  Surgeon: Heloise Purpura, MD;  Location: [facility];  Service: Urology;  Laterality: Right;   ESOPHAGOGASTRODUODENOSCOPY (EGD) WITH PROPOFOL N/A 04/24/2022   Procedure: ESOPHAGOGASTRODUODENOSCOPY (EGD) WITH PROPOFOL;  Surgeon: Imogene Burn, MD;  Location: [facility];  Service: Gastroenterology;  Laterality: N/A;   WISDOM TOOTH EXTRACTION  at age 23   Social History   Occupational History   Occupation: Tree surgeon  Tobacco Use   Smoking status: Never   Smokeless tobacco: Never  Building services engineer Use: Never used  Substance and Sexual Activity   Alcohol use: Never    Alcohol/week: 1.0 standard drink of alcohol    Types: 1 Glasses of wine per week    Comment: per week   Drug use: No   Sexual activity: Yes    Birth control/protection: None, Condom

## 2022-07-14 NOTE — Telephone Encounter (Signed)
This information is documented in another encounter

## 2022-08-02 ENCOUNTER — Encounter: Payer: Self-pay | Admitting: Emergency Medicine

## 2022-08-02 ENCOUNTER — Ambulatory Visit
Admission: EM | Admit: 2022-08-02 | Discharge: 2022-08-02 | Disposition: A | Discharge status: Home / Self Care | Payer: Medicaid Other | Attending: Emergency Medicine | Admitting: Emergency Medicine

## 2022-08-02 DIAGNOSIS — N3000 Acute cystitis without hematuria: Secondary | ICD-10-CM

## 2022-08-02 LAB — POCT URINALYSIS DIP (MANUAL ENTRY)
Bilirubin, UA: NEGATIVE
Blood, UA: NEGATIVE
Glucose, UA: NEGATIVE mg/dL
Ketones, POC UA: NEGATIVE mg/dL
Leukocytes, UA: NEGATIVE
Nitrite, UA: NEGATIVE
Protein Ur, POC: NEGATIVE mg/dL
Spec Grav, UA: 1.02 (ref 1.010–1.025)
Urobilinogen, UA: 0.2 E.U./dL
pH, UA: 8.5 — AB (ref 5.0–8.0)

## 2022-08-02 LAB — POCT URINE PREGNANCY: Preg Test, Ur: NEGATIVE

## 2022-08-02 MED ORDER — SULFAMETHOXAZOLE-TRIMETHOPRIM 800-160 MG PO TABS: 1.0000 | ORAL_TABLET | Freq: Two times a day (BID) | ORAL | 0 refills | Status: AC

## 2022-08-02 NOTE — Discharge Instructions (Signed)
Common causes of urinary tract infections include but are not limited to holding your urine longer than you should, squatting instead of sitting down when urinating, sitting around in wet clothing such as a wet swimsuit or gym clothes too long, not emptying your bladder after having sexual intercourse, wiping from back to front instead of front to back after having a bowel movement.     The urinalysis that we performed in the clinic today was abnormal.  Your urine pH was significantly elevated which is concerning for presence of a bacteria called Proteus mirabilis.   You were advised to begin antibiotics today because your urinalysis is abnormal and you are having active symptoms of an acute lower urinary tract infection also known as cystitis.     Please pick up and begin taking your prescription for Bactrim DS (trimethoprim sulfamethoxazole) as soon as possible.  Please take all doses exactly as prescribed.  You can take this medication with or without food.  This medication is safe to take with your other medications.   If you have not had complete resolution of your symptoms after completing treatment as prescribed, please return to urgent care for repeat evaluation or follow-up with your primary care provider.  Repeat urinalysis and urine culture may be indicated for more directed therapy.   Thank you for visiting urgent care today.  I appreciate the opportunity to participate in your care.

## 2022-08-02 NOTE — ED Triage Notes (Signed)
Patient c/o lower abdominal cramping x 2 days, body aches.  Some diarrhea, no nausea or vomiting.  No spotting.  Patient denies any dysuria, urinary urgency and frequency.  Patient has taken Advil.

## 2022-08-02 NOTE — ED Provider Notes (Incomplete)
[facility]    CSN: [id number] Arrival date & time: 08/02/22  9485    HISTORY   Chief Complaint  Patient presents with   Abdominal Pain   HPI [name] [name] is a pleasant, 36 y.o. female who presents to urgent care today. Patient c/o lower abdominal pressure and pain that is not related to menstrual cramps, pelvic heaviness that feel like she needs to move her bowels except she knows that she does not.  Patient states she is also had worsening body ache, states symptoms began 2 days ago.  Patient denies nausea and vomiting, patient reports a history of IBS and endorses a few episodes of diarrhea the past 2 days.  Patient states she has not been spotting, patient states she is sexually active, urine pregnancy test today was negative.  Patient denies fever, flank pain, vaginal discharge, vaginal irritation, burning with urination, urine hesitancy or urgency.  Patient states she is not she has been urinating a little more frequently but not remarkably so.  Patient states has been taking Advil without relief.    The history is provided by the patient.   Past Medical History:  Diagnosis Date   ADHD (attention deficit hyperactivity disorder)    Anxiety    Asthma    Brain fog 06/12/2021   Chronic diarrhea 01/26/2022   Depression    Phreesia 06/30/2020   Epigastric pain 01/07/2022   GERD (gastroesophageal reflux disease)    Headache    History of kidney stones    IBS (irritable bowel syndrome)    Lower extremity weakness 07/08/2021   Obesity    Pneumonia    Pneumonitis 03/02/2019   Sinusitis, acute 01/01/2021   Weakness 07/08/2021   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   Sleep disturbance 02/27/2022   Leg edema 02/27/2022   Hemorrhagic gastritis 01/26/2022   General counseling and advice for contraceptive management  10/28/2021   Therapeutic drug monitoring 10/28/2021   Other atopic dermatitis 04/16/2020   Food allergy 01/12/2020   Class 3 severe obesity due to excess calories without serious comorbidity with body mass index (BMI) of 45.0 to 49.9 in adult [facility]) 09/06/2019   Eczema of external ear, 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   Asthma, moderate persistent 03/02/2019   Other allergic rhinitis 03/02/2019   Mold suspected exposure 03/02/2019   Past Surgical History:  Procedure Laterality Date   BIOPSY  04/24/2022   Procedure: BIOPSY;  Surgeon: Imogene Burn, MD;  Location: [facility];  Service: Gastroenterology;;   COLONOSCOPY  04/24/2022   Procedure: COLONOSCOPY;  Surgeon: Imogene Burn, MD;  Location: [facility];  Service: Gastroenterology;;   CYSTOSCOPY WITH RETROGRADE PYELOGRAM, URETEROSCOPY AND STENT PLACEMENT Right 12/10/2016   Procedure: CYSTOSCOPY WITH RETROGRADE PYELOGRAM, URETEROSCOPY AND STONE REMOVAL;  Surgeon: Heloise Purpura, MD;  Location: [facility];  Service: Urology;  Laterality: Right;   ESOPHAGOGASTRODUODENOSCOPY (EGD) WITH PROPOFOL N/A 04/24/2022   Procedure: ESOPHAGOGASTRODUODENOSCOPY (EGD) WITH PROPOFOL;  Surgeon: Imogene Burn, MD;  Location: [facility];  Service: Gastroenterology;  Laterality: N/A;   WISDOM TOOTH EXTRACTION  at age 34   OB History     Gravida  1   Para  1   Term  1   Preterm      AB      Living  1      SAB      IAB  Ectopic      Multiple      Live Births  1          Home Medications    Prior to Admission medications   Medication Sig Start Date End Date Taking? Authorizing Provider  albuterol (VENTOLIN HFA) 108 (90 Base) MCG/ACT inhaler Inhale 2 puffs into the lungs every 6 (six) hours as needed for wheezing or shortness of breath. 01/07/22  Yes Storm Frisk, MD  ALPRAZolam Prudy Feeler) 0.5 MG tablet Take 0.25-0.5 mg by mouth every 6 (six) hours as needed for anxiety. 08/13/21  Yes [provider]  ARIPiprazole (ABILIFY) 5 MG tablet  Take 1 tablet (5 mg total) by mouth every morning. 05/23/22  Yes   cholecalciferol (VITAMIN D3) 25 MCG (1000 UNIT) tablet Take 1 tablet (1,000 Units total) by mouth daily. 06/05/22  Yes Storm Frisk, MD  cyanocobalamin (VITAMIN B12) 500 MCG tablet Take 1 tablet (500 mcg total) by mouth daily. 06/05/22  Yes Storm Frisk, MD  esomeprazole (NEXIUM) 40 MG capsule Take 1 capsule (40 mg total) by mouth 2 (two) times daily before a meal. 07/03/22 09/01/22 Yes Storm Frisk, MD  fluticasone [facility]) 50 MCG/ACT nasal spray Place into both nostrils daily.   Yes [provider]  fluticasone furoate-vilanterol (BREO ELLIPTA) 100-25 MCG/ACT AEPB Inhale 1 puff into the lungs daily. 07/03/22  Yes Storm Frisk, MD  lithium carbonate 300 MG capsule Take 300 mg by mouth 2 (two) times daily with a meal.   Yes [provider]  loratadine (CLARITIN) 10 MG tablet Take 10 mg by mouth daily.   Yes [provider]  modafinil (PROVIGIL) 200 MG tablet Take 200 mg by mouth every morning. 12/16/21  Yes [provider]  montelukast (SINGULAIR) 10 MG tablet Take 1 tablet (10 mg total) by mouth at bedtime. 07/03/22 12/30/22 Yes Storm Frisk, MD  pramipexole (MIRAPEX) 1.5 MG tablet Take 1 tablet (1.5 mg total) by mouth at bedtime. 05/23/22  Yes   sulfamethoxazole-trimethoprim (BACTRIM DS) 800-160 MG tablet Take 1 tablet by mouth 2 (two) times daily for 3 days. 08/02/22 08/05/22 Yes Theadora Rama Scales, PA-C    Family History Family History  Problem Relation Age of Onset   Depression Mother    Anxiety disorder Mother    Stomach cancer Neg Hx    Colon cancer Neg Hx    Esophageal cancer Neg Hx    Social History Social History   Tobacco Use   Smoking status: Never   Smokeless tobacco: Never  Vaping Use   Vaping Use: Never used  Substance Use Topics   Alcohol use: Never    Alcohol/week: 1.0 standard drink of alcohol    Types: 1 Glasses of wine per week     Comment: per week   Drug use: No   Allergies   Wellbutrin [bupropion], Onion, and Other  Review of Systems Review of Systems Pertinent findings revealed after performing a 14 point review of systems has been noted in the history of present illness.  Physical Exam Triage Vital Signs ED Triage Vitals  Enc Vitals Group     BP 05/31/21 0827 (!) 147/82     Pulse Rate 05/31/21 0827 72     Resp 05/31/21 0827 18     Temp 05/31/21 0827 98.3 F (36.8 C)     Temp Source 05/31/21 0827 Oral     SpO2 05/31/21 0827 98 %     Weight --  Height --      Head Circumference --      Peak Flow --      Pain Score 05/31/21 0826 5     Pain Loc --      Pain Edu? --      Excl. in GC? --   No data found.  Updated Vital Signs BP 117/78 (BP Location: Right Arm)   Pulse 83   Temp 99.4 F (37.4 C) (Oral)   Resp 18   Ht 5\' 4"  (1.626 m)   Wt 295 lb (133.8 kg)   LMP 05/04/2022   SpO2 98%   BMI 50.64 kg/m   Physical Exam Vitals and nursing note reviewed.  Constitutional:      General: She is not in acute distress.    Appearance: Normal appearance. She is not ill-appearing.  HENT:     Head: Normocephalic and atraumatic.  Eyes:     General: Lids are normal.        Right eye: No discharge.        Left eye: No discharge.     Extraocular Movements: Extraocular movements intact.     Conjunctiva/sclera: Conjunctivae normal.     Right eye: Right conjunctiva is not injected.     Left eye: Left conjunctiva is not injected.  Neck:     Trachea: Trachea and phonation normal.  Cardiovascular:     Rate and Rhythm: Normal rate and regular rhythm.     Pulses: Normal pulses.     Heart sounds: Normal heart sounds. No murmur heard.    No friction rub. No gallop.  Pulmonary:     Effort: Pulmonary effort is normal. No accessory muscle usage, prolonged expiration or respiratory distress.     Breath sounds: Normal breath sounds. No stridor, decreased air movement or transmitted upper airway sounds. No  decreased breath sounds, wheezing, rhonchi or rales.  Chest:     Chest wall: No tenderness.  Abdominal:     General: Abdomen is flat. Bowel sounds are normal. There is no distension.     Palpations: Abdomen is soft.     Tenderness: There is abdominal tenderness in the suprapubic area. There is no right CVA tenderness or left CVA tenderness.     Hernia: No hernia is present.  Musculoskeletal:        General: Normal range of motion.     Cervical back: Normal range of motion and neck supple. Normal range of motion.  Lymphadenopathy:     Cervical: No cervical adenopathy.  Skin:    General: Skin is warm and dry.     Findings: No erythema or rash.  Neurological:     General: No focal deficit present.     Mental Status: She is alert and oriented to person, place, and time.  Psychiatric:        Mood and Affect: Mood normal.        Behavior: Behavior normal.     Visual Acuity Right Eye Distance:   Left Eye Distance:   Bilateral Distance:    Right Eye Near:   Left Eye Near:    Bilateral Near:     UC Couse / Diagnostics / Procedures:     Radiology No results found.  Procedures Procedures (including critical care time) EKG  Pending results:  Labs Reviewed  POCT URINALYSIS DIP (MANUAL ENTRY) - Abnormal; Notable for the following components:      Result Value   Clarity, UA cloudy (*)    pH, UA  8.5 (*)    All other components within normal limits  POCT URINE PREGNANCY    Medications Ordered in UC: Medications - No data to display  UC Diagnoses / Final Clinical Impressions(s)   I have reviewed the triage vital signs and the nursing notes.  Pertinent labs & imaging results that were available during my care of the patient were reviewed by me and considered in my medical decision making (see chart for details).    Final diagnoses:  Acute cystitis without hematuria   {LMSTDP:27058}  {LMUTIP:27060}  Please see discharge instructions below for details of plan of care  as provided to patient. ED Prescriptions     Medication Sig Dispense Auth. Provider   sulfamethoxazole-trimethoprim (BACTRIM DS) 800-160 MG tablet Take 1 tablet by mouth 2 (two) times daily for 3 days. 6 tablet Theadora RamaMorgan, Clinton Wahlberg Scales, PA-C      PDMP not reviewed this encounter.  Disposition Upon Discharge:  Condition: stable for discharge home  Patient presented with concern for an acute illness with associated systemic symptoms and significant discomfort requiring urgent management. In my opinion, this is a condition that a prudent lay person (someone who possesses an average knowledge of health and medicine) may potentially expect to result in complications if not addressed urgently such as respiratory distress, impairment of bodily function or dysfunction of bodily organs.   As such, the patient has been evaluated and assessed, work-up was performed and treatment was provided in alignment with urgent care protocols and evidence based medicine.  Patient/parent/caregiver has been advised that the patient may require follow up for further testing and/or treatment if the symptoms continue in spite of treatment, as clinically indicated and appropriate.  Routine symptom specific, illness specific and/or disease specific instructions were discussed with the patient and/or caregiver at length.  Prevention strategies for avoiding STD exposure were also discussed.  The patient will follow up with their current PCP if and as advised. If the patient does not currently have a PCP we will assist them in obtaining one.   The patient may need specialty follow up if the symptoms continue, in spite of conservative treatment and management, for further workup, evaluation, consultation and treatment as clinically indicated and appropriate.  Patient/parent/caregiver verbalized understanding and agreement of plan as discussed.  All questions were addressed during visit.  Please see discharge instructions below  for further details of plan.  Discharge Instructions:   Discharge Instructions      Common causes of urinary tract infections include but are not limited to holding your urine longer than you should, squatting instead of sitting down when urinating, sitting around in wet clothing such as a wet swimsuit or gym clothes too long, not emptying your bladder after having sexual intercourse, wiping from back to front instead of front to back after having a bowel movement.     The urinalysis that we performed in the clinic today was abnormal.  Your urine pH was significantly elevated which is concerning for presence of a bacteria called Proteus mirabilis.   You were advised to begin antibiotics today because your urinalysis is abnormal and you are having active symptoms of an acute lower urinary tract infection also known as cystitis.     Please pick up and begin taking your prescription for Bactrim DS (trimethoprim sulfamethoxazole) as soon as possible.  Please take all doses exactly as prescribed.  You can take this medication with or without food.  This medication is safe to take with your other medications.  If you have not had complete resolution of your symptoms after completing treatment as prescribed, please return to urgent care for repeat evaluation or follow-up with your primary care provider.  Repeat urinalysis and urine culture may be indicated for more directed therapy.   Thank you for visiting urgent care today.  I appreciate the opportunity to participate in your care.     This office note has been dictated using Teaching laboratory technician.  Unfortunately, this method of dictation can sometimes lead to typographical or grammatical errors.  I apologize for your inconvenience in advance if this occurs.  Please do not hesitate to reach out to me if clarification is needed.

## 2022-08-05 ENCOUNTER — Other Ambulatory Visit: Payer: Self-pay | Admitting: Obstetrics and Gynecology

## 2022-08-05 ENCOUNTER — Encounter: Payer: Self-pay | Admitting: Obstetrics and Gynecology

## 2022-08-05 ENCOUNTER — Other Ambulatory Visit (HOSPITAL_COMMUNITY)
Admission: RE | Admit: 2022-08-05 | Discharge: 2022-08-05 | Disposition: A | Discharge status: Home / Self Care | Payer: Medicaid Other | Source: Ambulatory Visit | Attending: Obstetrics and Gynecology | Admitting: Obstetrics and Gynecology

## 2022-08-05 ENCOUNTER — Ambulatory Visit (INDEPENDENT_AMBULATORY_CARE_PROVIDER_SITE_OTHER): Payer: Medicaid Other | Admitting: Obstetrics and Gynecology

## 2022-08-05 ENCOUNTER — Other Ambulatory Visit: Payer: Self-pay

## 2022-08-05 VITALS — BP 132/80 | HR 86 | Wt 294.1 lb

## 2022-08-05 DIAGNOSIS — R300 Dysuria: Secondary | ICD-10-CM | POA: Diagnosis not present

## 2022-08-05 DIAGNOSIS — Z124 Encounter for screening for malignant neoplasm of cervix: Secondary | ICD-10-CM | POA: Insufficient documentation

## 2022-08-05 DIAGNOSIS — Z3009 Encounter for other general counseling and advice on contraception: Secondary | ICD-10-CM | POA: Diagnosis not present

## 2022-08-05 DIAGNOSIS — Z30430 Encounter for insertion of intrauterine contraceptive device: Secondary | ICD-10-CM

## 2022-08-05 MED ORDER — MISOPROSTOL 100 MCG PO TABS
ORAL_TABLET | ORAL | 0 refills | Status: DC
  Filled 2022-08-05 – 2022-09-18 (×3): qty 1, 1d supply, fill #0

## 2022-08-05 NOTE — Progress Notes (Signed)
  CC: BTL counseling Subjective:    Patient ID: [place] A Citro, female    DOB: 11-17-1985, 37 y.o.   MRN: [id number]  HPI 37 yo G1P1 seen for discussion of BTL.  Pt has a BMI of 50.  Discussed tubal ligation procedure and its permanence.  Patient desires bilateral tubal sterilization.  Other reversible forms of contraception were discussed with patient; she declines all other modalities. Husband declined vasectomy. Discussed bilateral tubal sterilization in detail; discussed laparoscopic bilateral tubal sterilization using Filshie clips.  Risks and benefits discussed in detail including but not limited to: risk of regret, permanence of method, bleeding, infection, injury to surrounding organs and need for additional procedures.  Failure risk of 1% for Filshie clips with increased risk of ectopic gestation if pregnancy occurs was also discussed with patient.   Patient verbalized understanding of these risks and benefits and wants to proceed with sterilization with laparoscopic bilateral sterilization.   She was told that she will be contacted by our surgical scheduler regarding the time and date of her surgery; routine preoperative instructions of having nothing to eat or drink after midnight on the day prior to surgery and also coming to the hospital 1 1/2 hours prior to her time of surgery were also emphasized.  She was told she may be called for a preoperative appointment about a week prior to surgery and will be given further preoperative instructions at that visit.  Routine postoperative instructions will be reviewed with the patient and her family in detail after surgery. Printed patient education handouts about the procedure was given to the patient to review at home.  Medicaid papers signed today, patient understands that surgery will be scheduled at least 30 days after the day papers are signed as per Medicaid guidelines.  Discussed modifications to IUD procedure which could make it less  uncomfortable.  Discussed possible difficulty performing tubal ligation due to central obesity.  Review of Systems     Objective:   Physical Exam Vitals:   08/05/22 1057  BP: 132/80  Pulse: 86         Assessment & Plan:   1. Cervical cancer screening To get pt current, pap taken - Cytology - PAP( [facility])  2. Unwanted fertility Pt initially wanted tubal ligation, but apparently changed her mind at checkout and now desires mirena IUD with possible paracervical block, sounding using endometrial biopsy pipelle, cytotec and pre procedure ibuprofen  3. Dysuria Will check urine culture as pt had empiric therapy last week with bactrim - Urine Culture  4. Birth control counseling Pt to be scheduled for iud insertion, see above  I spent 20 minutes dedicated to the care of this patient including previsit review of records, face to face time with the patient discussing treatment options and post visit testing.   Griffin Basil, MD Faculty Attending, [facility]

## 2022-08-05 NOTE — Progress Notes (Signed)
Rx for misoprostol 100 mcg sent

## 2022-08-05 NOTE — Progress Notes (Signed)
[name] [name] is here today to discuss possible BTL and will be getting a pap smear    Patient stated that she is currently dealing with a UTI and was prescribed Bactrim 3 day supply which she has completed. However, patient stated that she is still having symptoms (pelvic cramps, urgency to urinate)

## 2022-08-06 ENCOUNTER — Other Ambulatory Visit: Payer: Self-pay

## 2022-08-07 LAB — URINE CULTURE

## 2022-08-08 LAB — CYTOLOGY - PAP
Comment: NEGATIVE
Diagnosis: NEGATIVE
High risk HPV: NEGATIVE

## 2022-08-12 ENCOUNTER — Other Ambulatory Visit: Payer: Self-pay

## 2022-08-14 ENCOUNTER — Ambulatory Visit: Payer: No Typology Code available for payment source | Admitting: Pulmonary Disease

## 2022-08-15 ENCOUNTER — Encounter: Payer: Self-pay | Admitting: Physical Medicine and Rehabilitation

## 2022-08-15 ENCOUNTER — Ambulatory Visit (INDEPENDENT_AMBULATORY_CARE_PROVIDER_SITE_OTHER): Payer: Medicaid Other | Admitting: Physical Medicine and Rehabilitation

## 2022-08-15 DIAGNOSIS — M79642 Pain in left hand: Secondary | ICD-10-CM

## 2022-08-15 DIAGNOSIS — R531 Weakness: Secondary | ICD-10-CM | POA: Diagnosis not present

## 2022-08-15 DIAGNOSIS — M79641 Pain in right hand: Secondary | ICD-10-CM | POA: Diagnosis not present

## 2022-08-15 DIAGNOSIS — R202 Paresthesia of skin: Secondary | ICD-10-CM

## 2022-08-15 NOTE — Progress Notes (Unsigned)
[name] [name] - 37 y.o. female MRN [id number]  Date of birth: 09-11-1985  Office Visit Note: Visit Date: 08/15/2022 PCP: [name], MD Referred by: Leandrew Koyanagi, MD  Subjective: Chief Complaint  Patient presents with   Right Hand - Numbness, Pain, Weakness   Left Hand - Numbness, Pain, Weakness   HPI: [name] [name] is a 37 y.o. female who comes in todaypatient is a pleasant 37 year old right-hand-dominant female who comes in today with bilateral hand pain and paresthesias right greater than left.  Symptoms have been ongoing for years which is progressively worsened over the past 1 year.  She tells me she does a lot of baking and crocheting which seems to aggravate her symptoms.  She has pain to the base of the right thumb and into the first dorsal compartment with paresthesias primarily into the ring and small fingers.  She does get occasional weakness when she is picking things up.  Symptoms are worse when she is pinching, gripping or when she is sleeping at night where she will frequently wakes up shaking her hands.  She has tried ibuprofen as well as night splints without relief.  No previous cortisone injections or nerve conduction study.      ROS Otherwise per HPI.  Assessment & Plan: Visit Diagnoses:    ICD-10-CM   1. Paresthesia of skin  R20.2 NCV with EMG (electromyography)       Plan: No additional findings.   Meds & Orders: No orders of the defined types were placed in this encounter.   Orders Placed This Encounter  Procedures   NCV with EMG (electromyography)    Follow-up: No follow-ups on file.   Procedures: No procedures performed      Clinical History: No specialty comments available.   She reports that she has never smoked. She has never used smokeless tobacco.  Recent Labs    10/28/21 0908  HGBA1C 5.3    Objective:  VS:  HT:    WT:   BMI:     BP:   HR: bpm  TEMP: ( )  RESP:  Physical Exam Vitals and nursing note reviewed.   Constitutional:      General: She is not in acute distress.    Appearance: Normal appearance. She is well-developed. She is not ill-appearing.  HENT:     Head: Normocephalic and atraumatic.  Eyes:     Conjunctiva/sclera: Conjunctivae normal.     Pupils: Pupils are equal, round, and reactive to light.  Cardiovascular:     Rate and Rhythm: Normal rate.     Pulses: Normal pulses.  Pulmonary:     Effort: Pulmonary effort is normal.  Musculoskeletal:        General: No swelling, tenderness or deformity.     Right lower leg: No edema.     Left lower leg: No edema.     Comments: Inspection reveals no atrophy of the bilateral APB or FDI or hand intrinsics. There is no swelling, color changes, allodynia or dystrophic changes. There is 5 out of 5 strength in the bilateral wrist extension, finger abduction and long finger flexion. There is intact sensation to light touch in all dermatomal and peripheral nerve distributions. There is a negative Froment's test bilaterally. There is a negative Tinel's test at the bilateral wrist and elbow. There is a negative Phalen's test bilaterally. There is a negative Hoffmann's test bilaterally.  Skin:    General: Skin is warm and dry.  Findings: No erythema or rash.  Neurological:     General: No focal deficit present.     Mental Status: She is alert and oriented to person, place, and time.     Sensory: No sensory deficit.     Motor: No weakness or abnormal muscle tone.     Coordination: Coordination normal.     Gait: Gait normal.  Psychiatric:        Mood and Affect: Mood normal.        Behavior: Behavior normal.     Ortho Exam  Imaging: No results found.  Past Medical/Family/Surgical/Social History: Medications & Allergies reviewed per EMR, new medications updated. 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    Sleep disturbance 02/27/2022   Leg edema 02/27/2022   Hemorrhagic gastritis 01/26/2022   General counseling and advice for contraceptive management 10/28/2021   Therapeutic drug monitoring 10/28/2021   Other atopic dermatitis 04/16/2020   Food allergy 01/12/2020   Class 3 severe obesity due to excess calories without serious comorbidity with body mass index (BMI) of 45.0 to 49.9 in adult [facility]) 09/06/2019   Eczema of external ear, 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   Asthma, moderate persistent 03/02/2019   Other allergic rhinitis 03/02/2019   Mold suspected exposure 03/02/2019   Past Medical History:  Diagnosis Date   ADHD (attention deficit hyperactivity disorder)    Anxiety    Asthma    Brain fog 06/12/2021   Chronic diarrhea 01/26/2022   Depression    Phreesia 06/30/2020   Epigastric pain 01/07/2022   GERD (gastroesophageal reflux disease)    Headache    History of kidney stones    IBS (irritable bowel syndrome)    Lower extremity weakness 07/08/2021   Obesity    Pneumonia    Pneumonitis 03/02/2019   Sinusitis, acute 01/01/2021   Weakness 07/08/2021   Family History  Problem Relation Age of Onset   Depression Mother    Anxiety disorder Mother    Stomach cancer Neg Hx    Colon cancer Neg Hx    Esophageal cancer Neg Hx    Past Surgical History:  Procedure Laterality Date   BIOPSY  04/24/2022   Procedure: BIOPSY;  Surgeon: Sharyn Creamer, MD;  Location: [facility];  Service: Gastroenterology;;   COLONOSCOPY  04/24/2022   Procedure: COLONOSCOPY;  Surgeon: Sharyn Creamer, MD;  Location: [facility];  Service: Gastroenterology;;   CYSTOSCOPY WITH RETROGRADE PYELOGRAM, URETEROSCOPY AND STENT PLACEMENT Right 12/10/2016   Procedure: CYSTOSCOPY WITH RETROGRADE PYELOGRAM, URETEROSCOPY AND STONE REMOVAL;  Surgeon: Raynelle Bring, MD;  Location: [facility];  Service: Urology;  Laterality: Right;   ESOPHAGOGASTRODUODENOSCOPY (EGD) WITH PROPOFOL N/A 04/24/2022   Procedure:  ESOPHAGOGASTRODUODENOSCOPY (EGD) WITH PROPOFOL;  Surgeon: Sharyn Creamer, MD;  Location: [facility];  Service: Gastroenterology;  Laterality: N/A;   WISDOM TOOTH EXTRACTION  at age 20   Social History   Occupational History   Occupation: Training and development officer  Tobacco Use   Smoking status: Never   Smokeless tobacco: Never  Scientific laboratory technician Use: Never used  Substance and Sexual Activity   Alcohol use: Never    Alcohol/week: 1.0 standard drink of alcohol    Types: 1 Glasses of wine per week    Comment: per week   Drug use: No   Sexual activity: Yes    Birth control/protection: None, Condom

## 2022-08-15 NOTE — Progress Notes (Unsigned)
Functional Pain Scale - descriptive words and definitions  Mild (2)   Noticeable when not distracted/no impact on ADL's/sleep only slightly affected and able to   use both passive and active distraction for comfort. Mild range order  Average Pain  varies between 4-10 depending on activity   Right handed. Pain, numbness and weakness in both hands. Hard to grasp things, numbness in right pinky that radiates to the forearm soreness in the thumb area

## 2022-08-18 NOTE — Procedures (Signed)
EMG & NCV Findings: Evaluation of the left median (across palm) sensory nerve showed no response (Palm).  All remaining nerves (as indicated in the following tables) were within normal limits.  All left vs. right side differences were within normal limits.    All examined muscles (as indicated in the following table) showed no evidence of electrical instability.    Impression: Essentially NORMAL electrodiagnostic study of both upper limbs.  There is no significant electrodiagnostic evidence of nerve entrapment, brachial plexopathy or cervical radiculopathy.  As you know, purely sensory or demyelinating radiculopathies and chemical radiculitis may not be detected with this particular electrodiagnostic study.  Recommendations: 1.  Follow-up with referring physician. 2.  Continue current management of symptoms.  ___________________________ Laurence Spates FAAPMR Board Certified, American Board of Physical Medicine and Rehabilitation    Nerve Conduction Studies Anti Sensory Summary Table   Stim Site NR Peak (ms) Norm Peak (ms) P-T Amp (V) Norm P-T Amp Site1 Site2 Delta-P (ms) Dist (cm) Vel (m/s) Norm Vel (m/s)  Left Median Acr Palm Anti Sensory (2nd Digit)  31.8C  Wrist    3.4 <3.6 38.9 >10 Wrist Palm  0.0    Palm *NR  <2.0          Right Median Acr Palm Anti Sensory (2nd Digit)  30.9C  Wrist    3.2 <3.6 23.5 >10 Wrist Palm 1.3 0.0    Palm    1.9 <2.0 14.4         Left Radial Anti Sensory (Base 1st Digit)  31.5C  Wrist    1.9 <3.1 45.5  Wrist Base 1st Digit 1.9 0.0    Right Radial Anti Sensory (Base 1st Digit)  30.9C  Wrist    2.0 <3.1 29.9  Wrist Base 1st Digit 2.0 0.0    Left Ulnar Anti Sensory (5th Digit)  31.8C  Wrist    2.9 <3.7 20.3 >15.0 Wrist 5th Digit 2.9 14.0 48 >38  Right Ulnar Anti Sensory (5th Digit)  31.3C  Wrist    3.1 <3.7 24.1 >15.0 Wrist 5th Digit 3.1 14.0 45 >38   Motor Summary Table   Stim Site NR Onset (ms) Norm Onset (ms) O-P Amp (mV) Norm O-P Amp Site1  Site2 Delta-0 (ms) Dist (cm) Vel (m/s) Norm Vel (m/s)  Left Median Motor (Abd Poll Brev)  31.5C  Wrist    3.5 <4.2 7.7 >5 Elbow Wrist 3.7 21.0 57 >50  Elbow    7.2  7.4         Right Median Motor (Abd Poll Brev)  31C  Wrist    3.2 <4.2 6.8 >5 Elbow Wrist 3.8 20.5 54 >50  Elbow    7.0  6.5         Left Ulnar Motor (Abd Dig Min)  31.6C  Wrist    3.4 <4.2 7.6 >3 B Elbow Wrist 3.0 19.0 63 >53  B Elbow    6.4  4.1  A Elbow B Elbow 1.6 11.0 69 >53  A Elbow    8.0  9.4         Right Ulnar Motor (Abd Dig Min)  31C  Wrist    3.0 <4.2 8.3 >3 B Elbow Wrist 3.6 20.0 56 >53  B Elbow    6.6  10.3  A Elbow B Elbow 1.6 10.0 63 >53  A Elbow    8.2  9.8          EMG   Side Muscle Nerve Root Ins Act Fibs Psw  Amp Dur Poly Recrt Int Fraser Din Comment  Right Abd Poll Brev Median C8-T1 Nml Nml Nml Nml Nml 0 Nml Nml   Right 1stDorInt Ulnar C8-T1 Nml Nml Nml Nml Nml 0 Nml Nml   Right PronatorTeres Median C6-7 Nml Nml Nml Nml Nml 0 Nml Nml   Right Biceps Musculocut C5-6 Nml Nml Nml Nml Nml 0 Nml Nml   Right Deltoid Axillary C5-6 Nml Nml Nml Nml Nml 0 Nml Nml     Nerve Conduction Studies Anti Sensory Left/Right Comparison   Stim Site L Lat (ms) R Lat (ms) L-R Lat (ms) L Amp (V) R Amp (V) L-R Amp (%) Site1 Site2 L Vel (m/s) R Vel (m/s) L-R Vel (m/s)  Median Acr Palm Anti Sensory (2nd Digit)  31.8C  Wrist 3.4 3.2 0.2 38.9 23.5 39.6 Wrist Palm     Palm  1.9   14.4        Radial Anti Sensory (Base 1st Digit)  31.5C  Wrist 1.9 2.0 0.1 45.5 29.9 34.3 Wrist Base 1st Digit     Ulnar Anti Sensory (5th Digit)  31.8C  Wrist 2.9 3.1 0.2 20.3 24.1 15.8 Wrist 5th Digit 48 45 3   Motor Left/Right Comparison   Stim Site L Lat (ms) R Lat (ms) L-R Lat (ms) L Amp (mV) R Amp (mV) L-R Amp (%) Site1 Site2 L Vel (m/s) R Vel (m/s) L-R Vel (m/s)  Median Motor (Abd Poll Brev)  31.5C  Wrist 3.5 3.2 0.3 7.7 6.8 11.7 Elbow Wrist 57 54 3  Elbow 7.2 7.0 0.2 7.4 6.5 12.2       Ulnar Motor (Abd Dig Min)  31.6C  Wrist 3.4 3.0  0.4 7.6 8.3 8.4 B Elbow Wrist 63 56 7  B Elbow 6.4 6.6 0.2 4.1 10.3 60.2 A Elbow B Elbow 69 63 6  A Elbow 8.0 8.2 0.2 9.4 9.8 4.1          Waveforms:

## 2022-08-26 ENCOUNTER — Ambulatory Visit (INDEPENDENT_AMBULATORY_CARE_PROVIDER_SITE_OTHER): Payer: Medicaid Other | Admitting: Physician Assistant

## 2022-08-26 ENCOUNTER — Ambulatory Visit (INDEPENDENT_AMBULATORY_CARE_PROVIDER_SITE_OTHER): Payer: Medicaid Other

## 2022-08-26 ENCOUNTER — Ambulatory Visit: Payer: Self-pay

## 2022-08-26 DIAGNOSIS — M79642 Pain in left hand: Secondary | ICD-10-CM | POA: Diagnosis not present

## 2022-08-26 DIAGNOSIS — R202 Paresthesia of skin: Secondary | ICD-10-CM

## 2022-08-26 DIAGNOSIS — M79641 Pain in right hand: Secondary | ICD-10-CM | POA: Diagnosis not present

## 2022-08-26 NOTE — Progress Notes (Signed)
Office Visit Note   Patient: [name] [name]           Date of Birth: Jul 15, 1986           MRN: [id number] Visit Date: 08/26/2022              Requested by: [name], MD [address] [place],  [place] [address] PCP: [name], MD   Assessment & Plan: Visit Diagnoses:  1. Bilateral hand pain   2. Paresthesia of hand, bilateral     Plan: Impression is bilateral hand pain and paresthesias somewhat inconsistent as far as location and distribution of pain and paresthesias compared to her previous visit.  Nerve conduction study bilateral upper extremity is unremarkable.  Cervical spine MRI from 2022 is unremarkable.  X-rays are unremarkable.  At this point, I have discussed referral to neurology for further evaluation of bilateral hand pain and paresthesias as this does not seem like an orthopedic issue.  She will follow-up with [place] as needed.  Call with concerns or questions.  Follow-Up Instructions: Return if symptoms worsen or fail to improve.   Orders:  Orders Placed This Encounter  Procedures   XR Hand Complete Left   XR Hand Complete Right   No orders of the defined types were placed in this encounter.     Procedures: No procedures performed   Clinical Data: No additional findings.   Subjective: Chief Complaint  Patient presents with   Right Hand - Pain   Left Hand - Pain    HPI patient is a pleasant 37 year old female who comes in today to discuss nerve conduction studies bilateral upper extremities.  Nerve conduction study is unremarkable.  Patient is now complaining of pain primarily to the base of her thumbs both sides in addition to paresthesias to all 10 fingers but worse throughout both ring fingers.  She is still waking up at night having to shake her hands.  She notes that she does a lot of crocheting but has taken a break which does seem to somewhat alleviate her symptoms.  Of note, she underwent cervical spine MRI in 2022 which  was unremarkable.  Review of Systems as detailed in HPI.  All others reviewed and are negative.   Objective: Vital Signs: LMP 05/04/2022   Physical Exam well-developed well-nourished female no acute distress.  Alert and oriented x 3.  Ortho Exam bilateral hand exam shows a positive Phalen.  Negative Tinel at the wrist and elbow.  Mild tenderness to the base of both thumbs.  No pain or crepitus with grind test.  No tenderness to the first dorsal compartment.  She is neurovascularly intact distally.  Specialty Comments:  No specialty comments available.  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   Sleep disturbance 02/27/2022   Leg edema 02/27/2022   Hemorrhagic gastritis 01/26/2022   General counseling and advice for contraceptive management 10/28/2021   Therapeutic drug monitoring 10/28/2021   Other atopic dermatitis 04/16/2020   Food allergy 01/12/2020   Class 3 severe obesity due to excess calories without serious comorbidity with body mass index (BMI) of 45.0 to 49.9 in adult [facility]) 09/06/2019  Eczema of external ear, 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   Asthma, moderate persistent 03/02/2019   Other allergic rhinitis 03/02/2019   Mold suspected exposure 03/02/2019   Past Medical History:  Diagnosis Date   ADHD (attention deficit hyperactivity disorder)    Anxiety    Asthma    Brain fog 06/12/2021   Chronic diarrhea 01/26/2022   Depression    Phreesia 06/30/2020   Epigastric pain 01/07/2022   GERD (gastroesophageal reflux disease)    Headache    History of kidney stones    IBS (irritable bowel syndrome)    Lower extremity weakness 07/08/2021    Obesity    Pneumonia    Pneumonitis 03/02/2019   Sinusitis, acute 01/01/2021   Weakness 07/08/2021    Family History  Problem Relation Age of Onset   Depression Mother    Anxiety disorder Mother    Stomach cancer Neg Hx    Colon cancer Neg Hx    Esophageal cancer Neg Hx     Past Surgical History:  Procedure Laterality Date   BIOPSY  04/24/2022   Procedure: BIOPSY;  Surgeon: Sharyn Creamer, MD;  Location: [facility];  Service: Gastroenterology;;   COLONOSCOPY  04/24/2022   Procedure: COLONOSCOPY;  Surgeon: Sharyn Creamer, MD;  Location: [facility];  Service: Gastroenterology;;   [place], URETEROSCOPY AND STENT PLACEMENT Right 12/10/2016   Procedure: CYSTOSCOPY WITH RETROGRADE PYELOGRAM, URETEROSCOPY AND STONE REMOVAL;  Surgeon: Raynelle Bring, MD;  Location: [facility];  Service: Urology;  Laterality: Right;   ESOPHAGOGASTRODUODENOSCOPY (EGD) WITH PROPOFOL N/A 04/24/2022   Procedure: ESOPHAGOGASTRODUODENOSCOPY (EGD) WITH PROPOFOL;  Surgeon: Sharyn Creamer, MD;  Location: [facility];  Service: Gastroenterology;  Laterality: N/A;   WISDOM TOOTH EXTRACTION  at age 72   Social History   Occupational History   Occupation: Training and development officer  Tobacco Use   Smoking status: Never   Smokeless tobacco: Never  Scientific laboratory technician Use: Never used  Substance and Sexual Activity   Alcohol use: Never    Alcohol/week: 1.0 standard drink of alcohol    Types: 1 Glasses of wine per week    Comment: per week   Drug use: No   Sexual activity: Yes    Birth control/protection: None, Condom

## 2022-08-26 NOTE — Addendum Note (Signed)
Addended by: Lendon Collar on: 08/26/2022 11:13 AM   Modules accepted: Orders

## 2022-09-03 ENCOUNTER — Other Ambulatory Visit: Payer: Self-pay

## 2022-09-03 MED ORDER — LITHIUM CARBONATE ER 300 MG PO TBCR
600.0000 mg | EXTENDED_RELEASE_TABLET | Freq: Every day | ORAL | 0 refills | Status: DC
  Filled 2022-09-03: qty 60, 30d supply, fill #0
  Filled 2022-12-12: qty 60, 30d supply, fill #1

## 2022-09-03 MED ORDER — ARIPIPRAZOLE 5 MG PO TABS
5.0000 mg | ORAL_TABLET | ORAL | 0 refills | Status: DC
  Filled 2022-09-03: qty 30, 30d supply, fill #0

## 2022-09-03 MED ORDER — LAMOTRIGINE 200 MG PO TABS
200.0000 mg | ORAL_TABLET | Freq: Every day | ORAL | 0 refills | Status: DC
  Filled 2022-09-03: qty 30, 30d supply, fill #0
  Filled 2022-12-22: qty 30, 30d supply, fill #1

## 2022-09-03 MED ORDER — PRAMIPEXOLE DIHYDROCHLORIDE 0.75 MG PO TABS
0.7500 mg | ORAL_TABLET | Freq: Every day | ORAL | 0 refills | Status: DC
  Filled 2022-09-03: qty 30, 30d supply, fill #0
  Filled 2022-11-18: qty 30, 30d supply, fill #1
  Filled 2022-12-12: qty 30, 30d supply, fill #2

## 2022-09-03 MED ORDER — ESCITALOPRAM OXALATE 20 MG PO TABS
20.0000 mg | ORAL_TABLET | Freq: Every day | ORAL | 0 refills | Status: DC
  Filled 2022-09-03: qty 30, 30d supply, fill #0

## 2022-09-04 ENCOUNTER — Other Ambulatory Visit: Payer: Self-pay

## 2022-09-08 ENCOUNTER — Other Ambulatory Visit: Payer: Self-pay

## 2022-09-08 ENCOUNTER — Other Ambulatory Visit: Payer: Self-pay | Admitting: Critical Care Medicine

## 2022-09-08 MED ORDER — PANTOPRAZOLE SODIUM 40 MG PO TBEC
40.0000 mg | DELAYED_RELEASE_TABLET | Freq: Every day | ORAL | 2 refills | Status: DC
  Filled 2022-09-08 – 2022-09-23 (×2): qty 90, 90d supply, fill #0

## 2022-09-09 ENCOUNTER — Other Ambulatory Visit: Payer: Self-pay

## 2022-09-11 ENCOUNTER — Other Ambulatory Visit: Payer: Self-pay | Admitting: Pharmacist

## 2022-09-11 ENCOUNTER — Other Ambulatory Visit: Payer: Self-pay

## 2022-09-11 MED ORDER — BUDESONIDE-FORMOTEROL FUMARATE 80-4.5 MCG/ACT IN AERO
2.0000 | INHALATION_SPRAY | Freq: Two times a day (BID) | RESPIRATORY_TRACT | 3 refills | Status: DC
  Filled 2022-09-11: qty 10.2, fill #0

## 2022-09-11 MED ORDER — FLUTICASONE FUROATE-VILANTEROL 100-25 MCG/ACT IN AEPB
1.0000 | INHALATION_SPRAY | Freq: Every day | RESPIRATORY_TRACT | 11 refills | Status: DC
  Filled 2022-09-11: qty 60, 30d supply, fill #0

## 2022-09-15 ENCOUNTER — Other Ambulatory Visit: Payer: Self-pay

## 2022-09-16 ENCOUNTER — Other Ambulatory Visit: Payer: Self-pay

## 2022-09-18 ENCOUNTER — Other Ambulatory Visit: Payer: Self-pay

## 2022-09-19 ENCOUNTER — Other Ambulatory Visit: Payer: Self-pay

## 2022-09-22 ENCOUNTER — Ambulatory Visit (INDEPENDENT_AMBULATORY_CARE_PROVIDER_SITE_OTHER): Payer: Medicaid Other | Admitting: Obstetrics and Gynecology

## 2022-09-22 ENCOUNTER — Other Ambulatory Visit: Payer: Self-pay

## 2022-09-22 ENCOUNTER — Other Ambulatory Visit (HOSPITAL_COMMUNITY)
Admission: RE | Admit: 2022-09-22 | Discharge: 2022-09-22 | Disposition: A | Discharge status: Home / Self Care | Payer: Medicaid Other | Source: Ambulatory Visit | Attending: Obstetrics and Gynecology | Admitting: Obstetrics and Gynecology

## 2022-09-22 ENCOUNTER — Encounter: Payer: Self-pay | Admitting: Obstetrics and Gynecology

## 2022-09-22 VITALS — BP 131/85 | HR 84 | Wt 296.2 lb

## 2022-09-22 DIAGNOSIS — N898 Other specified noninflammatory disorders of vagina: Secondary | ICD-10-CM | POA: Insufficient documentation

## 2022-09-22 DIAGNOSIS — Z30430 Encounter for insertion of intrauterine contraceptive device: Secondary | ICD-10-CM

## 2022-09-22 LAB — POCT PREGNANCY, URINE: Preg Test, Ur: NEGATIVE

## 2022-09-22 MED ORDER — LEVONORGESTREL 20 MCG/DAY IU IUD
1.0000 | INTRAUTERINE_SYSTEM | Freq: Once | INTRAUTERINE | Status: AC
  Administered 2022-09-22: 1 via INTRAUTERINE

## 2022-09-22 MED ORDER — FLUCONAZOLE 150 MG PO TABS
150.0000 mg | ORAL_TABLET | ORAL | 3 refills | Status: DC
  Filled 2022-09-22: qty 3, 9d supply, fill #0

## 2022-09-22 NOTE — Addendum Note (Signed)
Addended by: Louisa Second E on: 09/22/2022 12:18 PM   Modules accepted: Orders

## 2022-09-22 NOTE — Progress Notes (Signed)
    GYNECOLOGY OFFICE PROCEDURE NOTE  [name] [name] is a 37 y.o. G1P1001 here for IUD insertion. No GYN concerns.  Last pap smear was on 08/2022 and was normal.  IUD Insertion Procedure Note Procedure: IUD insertion with Mirena UPT: Negative Vaginal culture done for itching. Will send in Diflucan while awaiting results.   Patient identified.  Risks, benefits and alternatives of procedure were discussed including irregular bleeding, cramping, infection, malpositioning or misplacement of the IUD outside the uterus which may require further procedure such as laparoscopy. Also discussed >99% contraception efficacy, increased risk of ectopic pregnancy with failure of method.   Emphasized that this did not protect against STIs, condoms recommended during all sexual encounters. Consent signed. Time out performed.   Speculum inserted and cervix visualized, prepped with 3 swabs of betadine.   Cervix numbed with intracervical block of 10 cc of 1% lidocaine. Grasped with a single tooth tenaculum, and IUD then inserted without difficulty per manufacturer's instructions and strings cut to 3 cm below cervical os and all instruments removed. Pt tolerated well with minimal pain and bleeding.   Discussed concerning signs/symptoms and to call if heavy bleeding, severe abdominal pain, or fever in the following 3 weeks. Manufacturer pamphlet/patient information given. Reviewed timing of efficacy for contraception and to use an alternative form of birth control until that time.   Radene Gunning, MD, [place] for [facility], [place]

## 2022-09-23 ENCOUNTER — Other Ambulatory Visit: Payer: Self-pay

## 2022-09-23 LAB — CERVICOVAGINAL ANCILLARY ONLY
Bacterial Vaginitis (gardnerella): NEGATIVE
Candida Glabrata: NEGATIVE
Candida Vaginitis: POSITIVE — AB
Comment: NEGATIVE
Comment: NEGATIVE
Comment: NEGATIVE

## 2022-09-26 ENCOUNTER — Other Ambulatory Visit: Payer: Self-pay

## 2022-09-29 ENCOUNTER — Telehealth: Payer: Self-pay | Admitting: General Practice

## 2022-09-29 NOTE — Telephone Encounter (Signed)
Patient called and left message on nurse voicemail line stating she had a Mirena IUD inserted last week. She had sex for the first time over the weekend and felt a pinch/poking and is wondering if something is wrong or the strings are too long- patient would like a call back

## 2022-09-30 ENCOUNTER — Other Ambulatory Visit: Payer: Self-pay

## 2022-10-01 NOTE — Telephone Encounter (Signed)
Called patient and she reports the pinch/poking improved after sex and eventually went away the next day. She also had some spotting. Discussed with patient that can happen with brand new IUDs but it should improve within the next week as those strings soften and move out of the way. Recommended she call us back if it continues to bother her so we can take a look. Patient verbalized understanding

## 2022-10-07 ENCOUNTER — Ambulatory Visit: Payer: Self-pay

## 2022-10-07 ENCOUNTER — Encounter: Payer: Self-pay | Admitting: Internal Medicine

## 2022-10-07 ENCOUNTER — Ambulatory Visit: Payer: Medicaid Other | Attending: Internal Medicine | Admitting: Internal Medicine

## 2022-10-07 VITALS — BP 127/79 | HR 93 | Temp 98.4°F | Ht 64.0 in | Wt 298.0 lb

## 2022-10-07 DIAGNOSIS — M79631 Pain in right forearm: Secondary | ICD-10-CM | POA: Diagnosis not present

## 2022-10-07 DIAGNOSIS — M25511 Pain in right shoulder: Secondary | ICD-10-CM | POA: Diagnosis not present

## 2022-10-07 NOTE — Progress Notes (Signed)
Patient ID: [name] [name], female    DOB: 1986-07-15  MRN: [id number]  CC: Arm Pain (R arm pain & shooting pain to R shoulder.)   Subjective: [name] [name] is a 37 y.o. female who presents for UC visit. PCP is Dr. Joya Gaskins Her concerns today include:  Pt with hx of moderate persistent asthma, Bipolar I disorder, chronic allergic rhinitis,  obesity   C/o pain in RT arm x 1 day Woke up with forearm tenderness yesterday morning.  No bruising, not sure if forearm is swollen. Pain worse with lifting and rotation of forearm. Numbness in 4-5th fingers depending on how much she is doing with her hands.  Reports she has had ongoing issues with paresthesia in the hands for which she has seen orthopedics and also had nerve conduction study done that was normal. Also pain in RT shoulder that also started yesterday.  Feels like sore muscle. Sharp jabbing pain with movements Crocheting  a lot more recently.  Taking Advil which helps marginally.  +PHQ9.  Hx of Bipolar.  Reports being plugged in with behavioral health a psychiatrist and counselor.  Saw her psychiatrist last week.  Reports compliance with taking her medications.  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   Sleep disturbance 02/27/2022   Leg edema 02/27/2022   Hemorrhagic gastritis 01/26/2022   General counseling and advice for contraceptive management 10/28/2021   Therapeutic drug monitoring 10/28/2021   Other atopic dermatitis 04/16/2020   Food allergy 01/12/2020   Class 3 severe obesity due to excess calories without serious comorbidity with body mass index (BMI) of 45.0 to 49.9 in adult [facility]) 09/06/2019   Eczema of external ear, 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   Asthma, moderate persistent 03/02/2019   Other allergic rhinitis 03/02/2019   Mold suspected exposure 03/02/2019     Current Outpatient  Medications on File Prior to Visit  Medication Sig Dispense Refill   albuterol (VENTOLIN HFA) 108 (90 Base) MCG/ACT inhaler Inhale 2 puffs into the lungs every 6 (six) hours as needed for wheezing or shortness of breath. 18 g 1   ALPRAZolam (XANAX) 0.5 MG tablet Take 0.25-0.5 mg by mouth every 6 (six) hours as needed for anxiety.     ARIPiprazole (ABILIFY) 5 MG tablet Take 1 tablet (5 mg total) by mouth every morning. 30 tablet 1   ARIPiprazole (ABILIFY) 5 MG tablet Take 1 tablet (5 mg total) by mouth every morning. 90 tablet 0   escitalopram (LEXAPRO) 20 MG tablet Take 1 tablet (20 mg total) by mouth daily. 90 tablet 0   fluconazole (DIFLUCAN) 150 MG tablet Take 1 tablet (150 mg total) by mouth every 3 (three) days. For three doses 3 tablet 3   lamoTRIgine (LAMICTAL) 200 MG tablet Take 1 tablet (200 mg total) by mouth at bedtime. (if out of lamictal more than 1 wk do not restart, call MD) 90 tablet 0   lithium carbonate (LITHOBID) 300 MG ER tablet Take 2 tablets (600 mg total) by mouth at bedtime. 180 tablet 0   modafinil (PROVIGIL) 200 MG tablet Take 200 mg by mouth every morning.     montelukast (SINGULAIR) 10 MG tablet Take 1 tablet (10 mg total) by mouth at bedtime. 90 tablet 2   pantoprazole (PROTONIX) 40 MG tablet Take 1 tablet (40 mg total) by mouth daily. 90 tablet 2   pramipexole (MIRAPEX) 0.75 MG  tablet Take 1 tablet (0.75 mg total) by mouth at bedtime. 90 tablet 0   budesonide-formoterol (SYMBICORT) 80-4.5 MCG/ACT inhaler Inhale 2 puffs into the lungs 2 (two) times daily. (Patient not taking: Reported on 10/07/2022) 10.2 g 3   No current facility-administered medications on file prior to visit.    Allergies  Allergen Reactions   Wellbutrin [Bupropion] Hives   Other Swelling    Bananas, kiwi Swelling in mouth and throat    Social History   Socioeconomic History   Marital status: Significant Other    Spouse name: Not on file   Number of children: 0   Years of education: Not  on file   Highest education level: Not on file  Occupational History   Occupation: artist  Tobacco Use   Smoking status: Never   Smokeless tobacco: Never  Vaping Use   Vaping Use: Never used  Substance and Sexual Activity   Alcohol use: Never    Alcohol/week: 1.0 standard drink of alcohol    Types: 1 Glasses of wine per week    Comment: per week   Drug use: No   Sexual activity: Yes    Birth control/protection: None, Condom  Other Topics Concern   Not on file  Social History Narrative   Not on file   Social Determinants of Health   Financial Resource Strain: Not on file  Food Insecurity: No Food Insecurity (09/22/2022)   Hunger Vital Sign    Worried About Running Out of Food in the Last Year: Never true    Ran Out of Food in the Last Year: Never true  Transportation Needs: No Transportation Needs (09/22/2022)   PRAPARE - Hydrologist (Medical): No    Lack of Transportation (Non-Medical): No  Physical Activity: Not on file  Stress: Not on file  Social Connections: Not on file  Intimate Partner Violence: Not on file    Family History  Problem Relation Age of Onset   Depression Mother    Anxiety disorder Mother    Stomach cancer Neg Hx    Colon cancer Neg Hx    Esophageal cancer Neg Hx     Past Surgical History:  Procedure Laterality Date   BIOPSY  04/24/2022   Procedure: BIOPSY;  Surgeon: Sharyn Creamer, MD;  Location: [facility];  Service: Gastroenterology;;   COLONOSCOPY  04/24/2022   Procedure: COLONOSCOPY;  Surgeon: Sharyn Creamer, MD;  Location: [facility];  Service: Gastroenterology;;   [place], URETEROSCOPY AND STENT PLACEMENT Right 12/10/2016   Procedure: CYSTOSCOPY WITH RETROGRADE PYELOGRAM, URETEROSCOPY AND STONE REMOVAL;  Surgeon: Raynelle Bring, MD;  Location: [facility];  Service: Urology;  Laterality: Right;   ESOPHAGOGASTRODUODENOSCOPY (EGD) WITH PROPOFOL N/A 04/24/2022   Procedure:  ESOPHAGOGASTRODUODENOSCOPY (EGD) WITH PROPOFOL;  Surgeon: Sharyn Creamer, MD;  Location: [facility];  Service: Gastroenterology;  Laterality: N/A;   WISDOM TOOTH EXTRACTION  at age 32    ROS: Review of Systems Negative except as stated above  PHYSICAL EXAM: BP 127/79 (BP Location: Left Arm, Patient Position: Sitting, Cuff Size: Large)   Pulse 93   Temp 98.4 F (36.9 C) (Oral)   Ht '5\' 4"'$  (1.626 m)   Wt 298 lb (135.2 kg)   LMP 07/18/2022 (Approximate) Comment: "2 months ago"  SpO2 99%   BMI 51.15 kg/m   Physical Exam  General appearance - alert, well appearing, young to middle-age obese Caucasian female and in no distress Mental status - normal mood,  behavior, speech, dress, motor activity, and thought processes Musculoskeletal -radial and brachial pulses 3+ bilaterally.  Right forearm: No edema of the wrist or forearm.  No erythema.  Point tenderness along the radial palmar surface of the right forearm in certain places.  Good range of motion of the wrist.  Right shoulder: Slight tenderness anterior joint line.  She has very good range of motion     10/07/2022    1:47 PM 09/22/2022   12:20 PM 07/03/2022   10:10 AM  Depression screen PHQ 2/9  Decreased Interest 1 0 1  Down, Depressed, Hopeless 2 0 1  PHQ - 2 Score 3 0 2  Altered sleeping '1 2 1  '$ Tired, decreased energy '2 2 1  '$ Change in appetite '1 1 1  '$ Feeling bad or failure about yourself  1 0 1  Trouble concentrating '1 1 1  '$ Moving slowly or fidgety/restless 1 0 1  Suicidal thoughts 0 0 0  PHQ-9 Score '10 6 8   '$ .gad .gad    Latest Ref Rng & Units 02/25/2022    9:49 AM 01/07/2022    4:37 PM 07/24/2021   10:26 AM  CMP  Glucose 70 - 99 mg/dL 99  90  82   BUN 6 - 23 mg/dL '10  10  12   '$ Creatinine 0.40 - 1.20 mg/dL 0.81  0.73  0.72   Sodium 135 - 145 mEq/L 138  139  139   Potassium 3.5 - 5.1 mEq/L 4.2  4.7  4.5   Chloride 96 - 112 mEq/L 104  101  103   CO2 19 - 32 mEq/L '27  23  25   '$ Calcium 8.4 - 10.5 mg/dL 9.7  9.4  9.3    Total Protein 6.0 - 8.3 g/dL 6.6  6.4  5.8   Total Bilirubin 0.2 - 1.2 mg/dL 0.4  0.3  0.3   Alkaline Phos 39 - 117 U/L 81  92  106   AST 0 - 37 U/L '12  15  26   '$ ALT 0 - 35 U/L '11  13  24    '$ Lipid Panel     Component Value Date/Time   CHOL 175 01/24/2017 0611   TRIG 171 (H) 01/24/2017 0611   HDL 43 01/24/2017 0611   CHOLHDL 4.1 01/24/2017 0611   VLDL 34 01/24/2017 0611   LDLCALC 98 01/24/2017 0611    CBC    Component Value Date/Time   WBC 9.1 07/03/2022 1047   WBC 7.5 02/25/2022 0949   RBC 4.50 07/03/2022 1047   RBC 4.53 02/25/2022 0949   HGB 13.1 07/03/2022 1047   HCT 39.5 07/03/2022 1047   PLT 406 07/03/2022 1047   MCV 88 07/03/2022 1047   MCH 29.1 07/03/2022 1047   MCH 28.8 07/08/2021 1235   MCHC 33.2 07/03/2022 1047   MCHC 32.8 02/25/2022 0949   RDW 14.0 07/03/2022 1047   LYMPHSABS 2.1 07/03/2022 1047   MONOABS 0.6 02/25/2022 0949   EOSABS 0.1 07/03/2022 1047   BASOSABS 0.1 07/03/2022 1047    ASSESSMENT AND PLAN: 1. Pain of right forearm 2. Acute pain of right shoulder Seems to be musculoskeletal in nature.  Forearm appears to be myofascial pain.  I recommend continuing Advil or Aleve 2-3 times a day as needed for the next several days.  Also recommend using some IcyHot rub over-the-counter as needed for the next several days.  Follow-up with PCP if no improvement.     Patient was given the opportunity  to ask questions.  Patient verbalized understanding of the plan and was able to repeat key elements of the plan.   This documentation was completed using Radio producer.  Any transcriptional errors are unintentional.  No orders of the defined types were placed in this encounter.    Requested Prescriptions    No prescriptions requested or ordered in this encounter    No follow-ups on file.  Karle Plumber, MD, FACP

## 2022-10-07 NOTE — Telephone Encounter (Signed)
  Chief Complaint: arm pain Symptoms: R arm pain in forearm and shoulder, constant, [id number] but if moves certain way [id number], slight swelling in hand Frequency: yesterday Pertinent Negatives: Patient denies fever or numbness Disposition: '[]'$ ED /'[]'$ Urgent Care (no appt availability in office) / '[x]'$ Appointment(In office/virtual)/ '[]'$  [facility]/ '[]'$ Home Care/ '[]'$ Refused Recommended Disposition /'[]'$ [facility]/ '[]'$  Follow-up with PCP Additional Notes: pt states she has been taking Advil but unsure what caused arm pain. Has been prepping for [employer] and crocheting but hasn't done anything specific to cause pain. Scheduled OV today at 1330 with Dr. Wynetta Emery.   Reason for Disposition  [1] MODERATE pain (e.g., interferes with normal activities) AND [2] present > 3 days  Answer Assessment - Initial Assessment Questions 1. ONSET: "When did the pain start?"     yesterday 2. LOCATION: "Where is the pain located?"     R arm top of forearm and shoulder  3. PAIN: "How bad is the pain?" (Scale 1-10; or mild, moderate, severe)   - MILD (1-3): Doesn't interfere with normal activities.   - MODERATE (4-7): Interferes with normal activities (e.g., work or school) or awakens from sleep.   - SEVERE (8-10): Excruciating pain, unable to do any normal activities, unable to hold a cup of water.     4-5/10, constant  6. OTHER SYMPTOMS: "Do you have any other symptoms?" (e.g., neck pain, swelling, rash, fever, numbness, weakness)     Finger and hand swelling  Protocols used: Arm Pain-A-AH

## 2022-10-07 NOTE — Patient Instructions (Signed)
Recommend continue Advil 3 times a day as needed for the next several days.  Be sure to take it with food.  Also recommend use of some IcyHot rub which can be purchased over-the-counter.  Follow-up with PCP if no improvement.

## 2022-10-15 ENCOUNTER — Ambulatory Visit (INDEPENDENT_AMBULATORY_CARE_PROVIDER_SITE_OTHER): Payer: Medicaid Other | Admitting: Diagnostic Neuroimaging

## 2022-10-15 ENCOUNTER — Encounter: Payer: Self-pay | Admitting: Diagnostic Neuroimaging

## 2022-10-15 VITALS — BP 124/78 | HR 95 | Ht 64.0 in | Wt 303.2 lb

## 2022-10-15 DIAGNOSIS — R200 Anesthesia of skin: Secondary | ICD-10-CM

## 2022-10-15 DIAGNOSIS — G9331 Postviral fatigue syndrome: Secondary | ICD-10-CM

## 2022-10-15 DIAGNOSIS — R202 Paresthesia of skin: Secondary | ICD-10-CM

## 2022-10-15 NOTE — Patient Instructions (Signed)
FATIGUE / PAIN / NUMBNESS (non-specific, constitutional symptoms; neuro exam, neuroimaging and neurophysiology tests are normal) - could be related to post-viral phenomenon, medication side effect, mood disorder - daily physical activity / exercise (at least 15-30 minutes) - eat more plants / vegetables - increase social activities, brain stimulation, games, puzzles, hobbies, crafts, arts, music - aim for at least 7-8 hours sleep per night (or more) - avoid smoking and alcohol

## 2022-10-15 NOTE — Progress Notes (Signed)
[facility]  PATIENT: [name] [name] DOB: Oct 19, 1985  REFERRING CLINICIAN: Leandrew Koyanagi, MD HISTORY FROM: patient REASON FOR VISIT: new consult   HISTORICAL  CHIEF COMPLAINT:  Chief Complaint  Patient presents with   New Patient (Initial Visit)    Patient in room #6 and alone. Patient states she has pain and numbness in both hand.    HISTORY OF PRESENT ILLNESS:   37 year old female here for evaluation of numbness in hands.  Patient had upper respiratory infection and fever, viral syndrome in March 2020, possibly COVID although testing was not available at that time.  In July 2020 she had another upper respiratory infection and pneumonia infection.  Since that time she has had significant fatigue and malaise symptoms.  December 2022 patient had significant malaise and weakness, was admitted to the hospital with extensive neurologic workup which was unremarkable.  For past 10 years patient has had intermittent numbness in bilateral hands, mainly digits 4 and 5, especially when she is doing certain activities such as leaning her elbows on a table or doing crochet work.  She had PCP and orthopedic evaluation including EMG nerve conduction study testing of upper extremities which is normal.  Symptoms can to be intermittent.  She is also having some issues with mental health management, malaise, fatigue, pain, deconditioning.   REVIEW OF SYSTEMS: Full 14 system review of systems performed and negative with exception of: as per HPI.  ALLERGIES: Allergies  Allergen Reactions   Wellbutrin [Bupropion] Hives   Other Swelling    Bananas, kiwi Swelling in mouth and throat    HOME MEDICATIONS: Outpatient Medications Prior to Visit  Medication Sig Dispense Refill   albuterol (VENTOLIN HFA) 108 (90 Base) MCG/ACT inhaler Inhale 2 puffs into the lungs every 6 (six) hours as needed for wheezing or shortness of breath. 18 g 1   ALPRAZolam (XANAX) 0.5 MG tablet Take  0.25-0.5 mg by mouth every 6 (six) hours as needed for anxiety.     ARIPiprazole (ABILIFY) 5 MG tablet Take 1 tablet (5 mg total) by mouth every morning. 30 tablet 1   ARIPiprazole (ABILIFY) 5 MG tablet Take 1 tablet (5 mg total) by mouth every morning. 90 tablet 0   budesonide-formoterol (SYMBICORT) 80-4.5 MCG/ACT inhaler Inhale 2 puffs into the lungs 2 (two) times daily. 10.2 g 3   escitalopram (LEXAPRO) 20 MG tablet Take 1 tablet (20 mg total) by mouth daily. 90 tablet 0   fluconazole (DIFLUCAN) 150 MG tablet Take 1 tablet (150 mg total) by mouth every 3 (three) days. For three doses 3 tablet 3   lamoTRIgine (LAMICTAL) 200 MG tablet Take 1 tablet (200 mg total) by mouth at bedtime. (if out of lamictal more than 1 wk do not restart, call MD) 90 tablet 0   lithium carbonate (LITHOBID) 300 MG ER tablet Take 2 tablets (600 mg total) by mouth at bedtime. 180 tablet 0   modafinil (PROVIGIL) 200 MG tablet Take 200 mg by mouth every morning.     montelukast (SINGULAIR) 10 MG tablet Take 1 tablet (10 mg total) by mouth at bedtime. 90 tablet 2   pantoprazole (PROTONIX) 40 MG tablet Take 1 tablet (40 mg total) by mouth daily. 90 tablet 2   pramipexole (MIRAPEX) 0.75 MG tablet Take 1 tablet (0.75 mg total) by mouth at bedtime. 90 tablet 0   No facility-administered medications prior to visit.    PAST MEDICAL HISTORY: Past Medical History:  Diagnosis Date   ADHD (attention  deficit hyperactivity disorder)    Anxiety    Asthma    Brain fog 06/12/2021   Chronic diarrhea 01/26/2022   Depression    Phreesia 06/30/2020   Epigastric pain 01/07/2022   GERD (gastroesophageal reflux disease)    Headache    History of kidney stones    IBS (irritable bowel syndrome)    Lower extremity weakness 07/08/2021   Obesity    Pneumonia    Pneumonitis 03/02/2019   Sinusitis, acute 01/01/2021   Weakness 07/08/2021    PAST SURGICAL HISTORY: Past Surgical History:  Procedure Laterality Date   BIOPSY   04/24/2022   Procedure: BIOPSY;  Surgeon: Sharyn Creamer, MD;  Location: [facility];  Service: Gastroenterology;;   COLONOSCOPY  04/24/2022   Procedure: COLONOSCOPY;  Surgeon: Sharyn Creamer, MD;  Location: [facility];  Service: Gastroenterology;;   [place], URETEROSCOPY AND STENT PLACEMENT Right 12/10/2016   Procedure: CYSTOSCOPY WITH RETROGRADE PYELOGRAM, URETEROSCOPY AND STONE REMOVAL;  Surgeon: Raynelle Bring, MD;  Location: [facility];  Service: Urology;  Laterality: Right;   ESOPHAGOGASTRODUODENOSCOPY (EGD) WITH PROPOFOL N/A 04/24/2022   Procedure: ESOPHAGOGASTRODUODENOSCOPY (EGD) WITH PROPOFOL;  Surgeon: Sharyn Creamer, MD;  Location: [facility];  Service: Gastroenterology;  Laterality: N/A;   WISDOM TOOTH EXTRACTION  at age 84    FAMILY HISTORY: Family History  Problem Relation Age of Onset   Depression Mother    Anxiety disorder Mother    Stomach cancer Neg Hx    Colon cancer Neg Hx    Esophageal cancer Neg Hx     SOCIAL HISTORY: Social History   Socioeconomic History   Marital status: Significant Other    Spouse name: Not on file   Number of children: 0   Years of education: Not on file   Highest education level: Not on file  Occupational History   Occupation: artist  Tobacco Use   Smoking status: Never   Smokeless tobacco: Never  Vaping Use   Vaping Use: Never used  Substance and Sexual Activity   Alcohol use: Never    Alcohol/week: 1.0 standard drink of alcohol    Types: 1 Glasses of wine per week    Comment: per week   Drug use: No   Sexual activity: Yes    Birth control/protection: None, Condom  Other Topics Concern   Not on file  Social History Narrative   Not on file   Social Determinants of Health   Financial Resource Strain: Not on file  Food Insecurity: No Food Insecurity (09/22/2022)   Hunger Vital Sign    Worried About Running Out of Food in the Last Year: Never true    Ran Out of Food in the Last Year: Never true   Transportation Needs: No Transportation Needs (09/22/2022)   PRAPARE - Hydrologist (Medical): No    Lack of Transportation (Non-Medical): No  Physical Activity: Not on file  Stress: Not on file  Social Connections: Not on file  Intimate Partner Violence: Not on file     PHYSICAL EXAM  GENERAL EXAM/CONSTITUTIONAL: Vitals:  Vitals:   10/15/22 0858  BP: 124/78  Pulse: 95  Weight: (!) 303 lb 3.2 oz (137.5 kg)  Height: '5\' 4"'$  (1.626 m)   Body mass index is 52.04 kg/m. Wt Readings from Last 3 Encounters:  10/15/22 (!) 303 lb 3.2 oz (137.5 kg)  10/07/22 298 lb (135.2 kg)  09/22/22 296 lb 3.2 oz (134.4 kg)   Patient is in  no distress; well developed, nourished and groomed; neck is supple  CARDIOVASCULAR: Examination of carotid arteries is normal; no carotid bruits Regular rate and rhythm, no murmurs Examination of peripheral vascular system by observation and palpation is normal  EYES: Ophthalmoscopic exam of optic discs and posterior segments is normal; no papilledema or hemorrhages No results found.  MUSCULOSKELETAL: Gait, strength, tone, movements noted in Neurologic exam below  NEUROLOGIC: MENTAL STATUS:      No data to display         awake, alert, oriented to person, place and time recent and remote memory intact normal attention and concentration language fluent, comprehension intact, naming intact fund of knowledge appropriate  CRANIAL NERVE:  2nd - no papilledema on fundoscopic exam 2nd, 3rd, 4th, 6th - pupils equal and reactive to light, visual fields full to confrontation, extraocular muscles intact, no nystagmus 5th - facial sensation symmetric 7th - facial strength symmetric 8th - hearing intact 9th - palate elevates symmetrically, uvula midline 11th - shoulder shrug symmetric 12th - tongue protrusion midline  MOTOR:  normal bulk and tone, full strength in the BUE, BLE  SENSORY:  normal and symmetric to light  touch temperature, vibration  COORDINATION:  finger-nose-finger, fine finger movements normal  REFLEXES:  deep tendon reflexes present and symmetric  GAIT/STATION:  narrow based gait     DIAGNOSTIC DATA (LABS, IMAGING, TESTING) - I reviewed patient records, labs, notes, testing and imaging myself where available.  Lab Results  Component Value Date   WBC 9.1 07/03/2022   HGB 13.1 07/03/2022   HCT 39.5 07/03/2022   MCV 88 07/03/2022   PLT 406 07/03/2022      Component Value Date/Time   NA 138 02/25/2022 0949   NA 139 01/07/2022 1637   K 4.2 02/25/2022 0949   CL 104 02/25/2022 0949   CO2 27 02/25/2022 0949   GLUCOSE 99 02/25/2022 0949   BUN 10 02/25/2022 0949   BUN 10 01/07/2022 1637   CREATININE 0.81 02/25/2022 0949   CALCIUM 9.7 02/25/2022 0949   PROT 6.6 02/25/2022 0949   PROT 6.4 01/07/2022 1637   ALBUMIN 4.4 02/25/2022 0949   ALBUMIN 4.2 01/07/2022 1637   AST 12 02/25/2022 0949   ALT 11 02/25/2022 0949   ALKPHOS 81 02/25/2022 0949   BILITOT 0.4 02/25/2022 0949   BILITOT 0.3 01/07/2022 1637   GFRNONAA >60 07/08/2021 1235   GFRAA >60 10/15/2018 2329   Lab Results  Component Value Date   CHOL 175 01/24/2017   HDL 43 01/24/2017   LDLCALC 98 01/24/2017   TRIG 171 (H) 01/24/2017   CHOLHDL 4.1 01/24/2017   Lab Results  Component Value Date   HGBA1C 5.3 10/28/2021   Lab Results  Component Value Date   VITAMINB12 360 07/01/2021   Lab Results  Component Value Date   TSH 0.932 07/01/2021    07/01/21 MRI brain / cervical spine - normal  07/08/21 MRI thoracic / lumbar  - normal  08/15/22 EMG/NCS 1.  Follow-up with referring physician. 2.  Continue current management of symptoms.   ASSESSMENT AND PLAN  37 y.o. year old female here with:   Dx:  1. Postviral fatigue syndrome   2. Numbness and tingling in both hands     PLAN:  FATIGUE / PAIN / NUMBNESS (non-specific, constitutional symptoms; neuro exam, neuroimaging and neurophysiology  tests are normal) - could be related to post-viral phenomenon, medication side effect, mood disorder, deconditioning, intermittent nerve irritation (without major nerve damage) - daily physical  activity / exercise (at least 15-30 minutes) - eat more plants / vegetables - increase social activities, brain stimulation, games, puzzles, hobbies, crafts, arts, music - aim for at least 7-8 hours sleep per night (or more) - avoid smoking and alcohol  Return for return to PCP, pending if symptoms worsen or fail to improve.    Penni Bombard, MD [id number], [id number] AM Certified in Neurology, Neurophysiology and Neuroimaging  [facility] [address] [place], [place] [address] [phone]

## 2022-11-03 NOTE — Progress Notes (Unsigned)
Established Patient Office Visit  Subjective:  Patient ID: [name] [name], female    DOB: [birth year]  Age: 37 y.o. MRN: [id number]  CC:  No chief complaint on file.   HPI 07/08/21 [name] [name] presents for post emergency room follow-up.  Patient was in the emergency room between the 28th and 29 November with lower extremity weakness.  Below is documentation from that emergency room visit  37 yo female presets with lower extremity weakness.  Patient has had URI symptoms and was treated for sinusitis with Augmentin.  [name] [name] felt better from the ninth of the 19th when she was on the Augmentin.  She was off of it over the weekend and had some fevers.  She has now noted difficulty walking.  She feels that she is weak and she has to shuffle.  She has difficulty standing up from a seated position.  She has also had cough with this.  She has a history of asthma.  She has had some difficulty breathing and has had to have her asthma medications adjusted.  She does not feel that she is having trouble with her feet or ankles.  She denies any numbness.  Patient was initially seen and evaluated at urgent care which had a chest x-ray done, with minimal basilar atelectasis, normal sed rate, normal CBC and negative POC flu and mono testing.  She is then seen here and screened.  MR of the head and neck were ordered.  Neurology was consulted via phone.  She has not had her MRI and is to be reevaluated. 37 year old female with recent febrile illness, also recently on antibiotics who presents today with proximal leg weakness difficulty walking.   Patient with lower extremity weakness.  Concern for [name] discussed with Dr. Cheral Marker.  Plan admission to medicine for observation and mr of thoracic and lumbar spine. Patient now wishes to leave.  I have discussed risks with patient including respiratory distress and arrest.  We have discussed return precautions.  We discussed that she can return at any time if  she is feeling worse.  They do live within the city and her husband will be with her. Pt left AMA  needs Thoracic /lumbar MRI  The patient did leave [facility] was going to be admitted for observation and have additional testing for potential Guillain-Barr.  The patient is self-pay.  The patient states her symptoms have progressed.  She now has to use a Rollator to walk.  She has extreme weakness in her lower extremities and also now in her upper arm.  She has sensory changes in her feet as well.  She has severe muscle aches in the lower extremity.  He has difficulty sleeping and low-grade temperatures to 100 degrees.  She has loose stools.  Patient also has difficulty taking a deep breath.  She states there is a rash over the back of her neck.  She did take the 50 mg prednisone given to her in the emergency room and finished this without any change in symptoms.  3/27 Was sent to ED last Ov and admitted as noted below Date of Admission: 07/08/2021                      Date of Discharge: 07/09/2021 Admitting Physician: [name], MD   Primary Care Provider: [name], MD Consultants: Neurology   Indication for Hospitalization: Weakness   Discharge Diagnoses/Problem List:  Principal Problem:   Weakness  Disposition: [facility] with [facility] PT   Discharge Condition: Stable   Discharge Exam: From same day progress note: General: well appearing, NAD  Cardiovascular: regular rate and rhythm, no murmur appreciated.  Respiratory: clear, no wheezing or crackles  Abdomen: soft, non-tender  Extremities: no peripheral edema  Neuro: Strength 5/5 bilaterally in UE and LE. No focal deficits, no facial drooping, slurred speech, changes in vision.    Brief Hospital Course:  [name] [name] is a 37 y.o. female who presented with 10 days of weakness and myalgias. PMH is significant for moderate persistent asthma, Bipolar I disorder, chronic allergic rhinitis, Class 3 obesity. Her  hospital course is outlined below:    Weakness  Muscular Tenderness  Body Aches Patient presented with 10 days of weakness, body aches, and diffuse muscular tenderness. Initial workup with MRI of brain and spine were unremarkable with no evidence of demyelinating disease. Patient had intact reflexes and isolated muscle groups appeared to have intact strength. Presentation was less concerning for [name] Syndrome. Initial labs showed CK 33, CRP 2.0, sed rate 8, cortisol am 9.8, and no electrolyte abnormalities. Neurology began workup for rheumatologic disease, with several labs pending at the time of discharge (see below). If rheumatologic workup negative, differential includes inflammatory myositis vs fibromyalgia vs functional disorder. Neuro recommended outpatient follow up with Dr. [name] in 2-4 weeks. Patient was evaluated by physical therapy and occupational therapy who recommended outpatient PT.    Items for PCP Follow-up: Follow up with psychiatry for bipolar disorder to optimize treatment.  Follow up on autoimmune lab results.  Outpatient PT follow up.  Follow up with Neurology, Dr. 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  Her neurologic work-up otherwise was negative.  She has no autoimmune conditions on work-up.  Patient has had several rounds of physical therapy ending in January.  Patient is feeling better in this regard.  She feels like she is back to baseline.  She continues to follow-up with her mental health provider for her bipolar disorder.  She needs a lithium level checked and  her Lamictal refilled at this visit.  Patient's been using albuterol quite a bit recently.  She has a dry cough that wakes her up at night.  She denies any reflux symptoms.  She has significant rhinitis and nasal drainage.  She tends to sleep with her mouth open because her nose is obstructed.  Note patient does need a Pap smear.  Her pisq suggest that she does not want to have further children and is interested in exploring birth control measures.  Note she is self-pay.  She does have the orange card.  Because of elevated blood sugars in the hospital she would like an A1c repeated.   6/6 Patient returns complaining of episodes of diarrhea nausea and emesis.  The emesis is watery and the diarrhea has been chronic for 3 weeks.  She will go multiple times during  the day.  She is trying to follow a vegan diet and she does drink occasional dairy milk.  She had extensive allergy work-up in 2021 with no evidence of food allergy seen.  She does have some intolerances to certain dairy products.  She is never been told she is lactose intolerant.  Patient recently started on Provigil per psychiatry.  She is on a 200 mg dose which is associated with diarrhea and nausea.  Asthma is stable.  She is having some allergies during the spring season.  7/27 Patient seen in return follow-up she has had a slight cough for 2 days some lower extremity edema she is a vegan and does eat processed vegetarian foods.  Patient is requesting a sleep study as well.  Note she has a gynecology appointment on August 21 for Pap smear.  She is reapplying for the orange card.  She has follow-up appoint with gastroenterology for evaluation of her diarrhea and chronic abdominal pain.  She is seeing GI already lab studies have been unrevealing.  She will need a both an upper and endoscopy and colonoscopy.  Asthma has been stable during the heat of the summer.  She still has loose stools at this time.  She is taking Protonix twice daily  now.  11/30 Patient seen in return follow-up she had been evaluated by gastroenterology she had diarrhea and abdominal pain found to have reflux and gastritis and negative colonoscopy.  She has been placed on Nexium and taken off Protonix and is doing better.  She is changed her diet as well.  She does have a chronic fatigue type syndrome and has had elevated C-reactive protein in the past.  She had a neuromuscular condition last year that she slowly recovered from that was unclear etiology.  She would like to see neurology in follow-up.  She also has bilateral carpal tunnel type symptoms.  On arrival blood pressure is good 126/81.  She still has excess weight with elevated BMI 51.  She is a vegan and tries to eat healthy but does eat a lot of processed foods There is a question of narcolepsy and sleep apnea she been seen by sleep medicine there and unsure of that diagnosis of narcolepsy but her psychiatrist has her on Provigil.  The patient also is following with psychiatry her mental health medicines have been adjusted.  The patient did have a sleep study has not yet been read.  Past Medical History:  Diagnosis Date   ADHD (attention deficit hyperactivity disorder)    Anxiety    Asthma    Brain fog 06/12/2021   Chronic diarrhea 01/26/2022   Depression    Phreesia 06/30/2020   Epigastric pain 01/07/2022   GERD (gastroesophageal reflux disease)    Headache    History of kidney stones    IBS (irritable bowel syndrome)    Lower extremity weakness 07/08/2021   Obesity    Pneumonia    Pneumonitis 03/02/2019   Sinusitis, acute 01/01/2021   Weakness 07/08/2021    Past Surgical History:  Procedure Laterality Date   BIOPSY  04/24/2022   Procedure: BIOPSY;  Surgeon: Sharyn Creamer, MD;  Location: [facility];  Service: Gastroenterology;;   COLONOSCOPY  04/24/2022   Procedure: COLONOSCOPY;  Surgeon: Sharyn Creamer, MD;  Location: [facility];  Service: Gastroenterology;;   [place], URETEROSCOPY AND STENT PLACEMENT Right 12/10/2016   Procedure: CYSTOSCOPY WITH RETROGRADE PYELOGRAM, URETEROSCOPY AND STONE REMOVAL;  Surgeon: Raynelle Bring, MD;  Location: [facility];  Service: Urology;  Laterality: Right;   ESOPHAGOGASTRODUODENOSCOPY (EGD) WITH PROPOFOL N/A 04/24/2022   Procedure: ESOPHAGOGASTRODUODENOSCOPY (EGD) WITH PROPOFOL;  Surgeon: Sharyn Creamer, MD;  Location: [facility];  Service: Gastroenterology;  Laterality: N/A;   WISDOM TOOTH EXTRACTION  at age 37    Family History  Problem Relation Age of Onset   Depression Mother    Anxiety disorder Mother    Stomach cancer Neg Hx    Colon cancer Neg Hx    Esophageal cancer Neg Hx     Social History   Socioeconomic History   Marital status: Significant Other    Spouse name: Not on file   Number of children: 0   Years of education: Not on file   Highest education level: Bachelor's degree (e.g., BA, AB, BS)  Occupational History   Occupation: Training and development officer  Tobacco Use   Smoking status: Never   Smokeless tobacco: Never  Vaping Use   Vaping Use: Never used  Substance and Sexual Activity   Alcohol use: Never    Alcohol/week: 1.0 standard drink of alcohol    Types: 1 Glasses of wine per week    Comment: per week   Drug use: No   Sexual activity: Yes    Birth control/protection: None, Condom  Other Topics Concern   Not on file  Social History Narrative   Not on file   Social Determinants of Health   Financial Resource Strain: Low Risk  (10/31/2022)   Overall Financial Resource Strain (CARDIA)    Difficulty of Paying Living Expenses: Not very hard  Food Insecurity: Food Insecurity Present (10/31/2022)   Hunger Vital Sign    Worried About Running Out of Food in the Last Year: Sometimes true    Ran Out of Food in the Last Year: Never true  Transportation Needs: No Transportation Needs (10/31/2022)   PRAPARE - Hydrologist (Medical): No    Lack of Transportation  (Non-Medical): No  Physical Activity: Unknown (10/31/2022)   Exercise Vital Sign    Days of Exercise per Week: 0 days    Minutes of Exercise per Session: Not on file  Stress: No Stress Concern Present (10/31/2022)   [place]    Feeling of Stress : Only a little  Social Connections: Moderately Isolated (10/31/2022)   Social Connection and Isolation Panel [NHANES]    Frequency of Communication with Friends and Family: Three times a week    Frequency of Social Gatherings with Friends and Family: Twice a week    Attends Religious Services: Never    Marine scientist or Organizations: No    Attends Music therapist: Not on file    Marital Status: Living with partner  Intimate Partner Violence: Not on file    Outpatient Medications Prior to Visit  Medication Sig Dispense Refill   albuterol (VENTOLIN HFA) 108 (90 Base) MCG/ACT inhaler Inhale 2 puffs into the lungs every 6 (six) hours as needed for wheezing or shortness of breath. 18 g 1   ALPRAZolam (XANAX) 0.5 MG tablet Take 0.25-0.5 mg by mouth every 6 (six) hours as needed for anxiety.     ARIPiprazole (ABILIFY) 5 MG tablet Take 1 tablet (5 mg total) by mouth every morning. 30 tablet 1   ARIPiprazole (ABILIFY) 5 MG tablet Take 1 tablet (5 mg total) by mouth every morning. 90 tablet 0   budesonide-formoterol (SYMBICORT) 80-4.5 MCG/ACT inhaler Inhale 2 puffs into  the lungs 2 (two) times daily. 10.2 g 3   escitalopram (LEXAPRO) 20 MG tablet Take 1 tablet (20 mg total) by mouth daily. 90 tablet 0   fluconazole (DIFLUCAN) 150 MG tablet Take 1 tablet (150 mg total) by mouth every 3 (three) days. For three doses 3 tablet 3   lamoTRIgine (LAMICTAL) 200 MG tablet Take 1 tablet (200 mg total) by mouth at bedtime. (if out of lamictal more than 1 wk do not restart, call MD) 90 tablet 0   lithium carbonate (LITHOBID) 300 MG ER tablet Take 2 tablets (600 mg total) by mouth  at bedtime. 180 tablet 0   modafinil (PROVIGIL) 200 MG tablet Take 200 mg by mouth every morning.     montelukast (SINGULAIR) 10 MG tablet Take 1 tablet (10 mg total) by mouth at bedtime. 90 tablet 2   pantoprazole (PROTONIX) 40 MG tablet Take 1 tablet (40 mg total) by mouth daily. 90 tablet 2   pramipexole (MIRAPEX) 0.75 MG tablet Take 1 tablet (0.75 mg total) by mouth at bedtime. 90 tablet 0   No facility-administered medications prior to visit.    Allergies  Allergen Reactions   Wellbutrin [Bupropion] Hives   Other Swelling    Bananas, kiwi Swelling in mouth and throat    ROS Review of Systems  Constitutional:  Positive for fatigue. Negative for chills, diaphoresis and fever.  HENT:  Negative for congestion, ear discharge, ear pain, hearing loss, nosebleeds, postnasal drip, rhinorrhea, sinus pressure, sinus pain, sneezing, sore throat and tinnitus.   Eyes:  Negative for photophobia, discharge, redness and itching.  Respiratory:  Negative for cough, chest tightness, shortness of breath, wheezing and stridor.   Cardiovascular:  Negative for chest pain, palpitations and leg swelling.  Gastrointestinal:  Negative for abdominal pain, blood in stool, constipation, diarrhea, nausea and vomiting.       No heartburn  Endocrine: Negative for polydipsia.  Genitourinary:  Negative for dysuria, flank pain, frequency, hematuria and urgency.  Musculoskeletal:  Negative for gait problem, myalgias and neck pain.  Skin:  Negative for rash.  Allergic/Immunologic: Negative for environmental allergies.  Neurological:  Negative for dizziness, tremors, seizures, weakness and headaches.  Hematological:  Does not bruise/bleed easily.  Psychiatric/Behavioral: Negative.  Negative for suicidal ideas. The patient is not nervous/anxious.       Objective:    Physical Exam Vitals reviewed.  Constitutional:      Appearance: Normal appearance. She is well-developed. She is obese. She is not diaphoretic.   HENT:     Head: Normocephalic and atraumatic.     Nose: No nasal deformity, septal deviation, mucosal edema, congestion or rhinorrhea.     Right Sinus: No maxillary sinus tenderness or frontal sinus tenderness.     Left Sinus: No maxillary sinus tenderness or frontal sinus tenderness.     Mouth/Throat:     Mouth: Mucous membranes are moist.     Pharynx: Oropharynx is clear. No oropharyngeal exudate.  Eyes:     General: No scleral icterus.    Conjunctiva/sclera: Conjunctivae normal.     Pupils: Pupils are equal, round, and reactive to light.  Neck:     Thyroid: No thyromegaly.     Vascular: No carotid bruit or JVD.     Trachea: Trachea normal. No tracheal tenderness or tracheal deviation.  Cardiovascular:     Rate and Rhythm: Normal rate and regular rhythm.     Chest Wall: PMI is not displaced.     Pulses: Normal pulses. No decreased  pulses.     Heart sounds: Normal heart sounds, S1 normal and S2 normal. Heart sounds not distant. No murmur heard.    No systolic murmur is present.     No diastolic murmur is present.     No friction rub. No gallop. No S3 or S4 sounds.  Pulmonary:     Effort: Pulmonary effort is normal. No tachypnea, accessory muscle usage or respiratory distress.     Breath sounds: Normal breath sounds. No stridor. No decreased breath sounds, wheezing, rhonchi or rales.  Chest:     Chest wall: No tenderness.  Abdominal:     General: Bowel sounds are normal. There is no distension.     Palpations: Abdomen is soft. Abdomen is not rigid.     Tenderness: There is no abdominal tenderness. There is no right CVA tenderness, left CVA tenderness, guarding or rebound.  Musculoskeletal:        General: Normal range of motion.     Cervical back: Normal range of motion and neck supple. No edema, erythema or rigidity. No muscular tenderness. Normal range of motion.  Lymphadenopathy:     Head:     Right side of head: No submental or submandibular adenopathy.     Left side of  head: No submental or submandibular adenopathy.     Cervical: No cervical adenopathy.  Skin:    General: Skin is warm and dry.     Coloration: Skin is not pale.     Findings: No rash.     Nails: There is no clubbing.  Neurological:     General: No focal deficit present.     Mental Status: She is alert and oriented to person, place, and time.     Cranial Nerves: Cranial nerves 2-12 are intact.     Sensory: No sensory deficit.     Motor: No weakness or abnormal muscle tone.     Gait: Gait and tandem walk normal.  Psychiatric:        Mood and Affect: Mood normal.        Speech: Speech normal.        Behavior: Behavior normal.        Thought Content: Thought content normal.        Judgment: Judgment normal.     There were no vitals taken for this visit. Wt Readings from Last 3 Encounters:  10/15/22 (!) 303 lb 3.2 oz (137.5 kg)  10/07/22 298 lb (135.2 kg)  09/22/22 296 lb 3.2 oz (134.4 kg)    Imaging Results (Last 48 hours)  MR THORACIC SPINE W WO CONTRAST   Result Date: 07/08/2021 CLINICAL DATA:  Motor neuron disease EXAM: MRI THORACIC AND LUMBAR SPINE WITHOUT AND WITH CONTRAST TECHNIQUE: Multiplanar and multiecho pulse sequences of the thoracic and lumbar spine were obtained without and with intravenous contrast. CONTRAST:  27mL GADAVIST GADOBUTROL 1 MMOL/ML IV SOLN COMPARISON:  Chest CT 03/15/2019 FINDINGS: MRI THORACIC SPINE FINDINGS There is respiratory motion artifact present which mildly degrades image quality on axial sequences. Alignment:  Physiologic. Vertebrae:  No fracture, evidence of discitis, or bone lesion. Cord: No abnormal cord signal.  No abnormal enhancement. Paraspinal and other soft tissues: Negative Disc levels: Evaluation of the disc levels are demonstrates no large disc herniation, significant spinal canal or neural foraminal narrowing. MRI LUMBAR SPINE FINDINGS Segmentation:  Standard. Alignment:  Physiologic. Vertebrae: No fracture, evidence of discitis, or  aggressive bone lesion. Conus medullaris and cauda equina: Conus extends to the L1 level. Conus  and cauda equina appear normal. No abnormal enhancement. Paraspinal and other soft tissues: Tiny bilateral renal cysts. Disc levels: T12-L1: No significant spinal canal or neural foraminal narrowing. L1-L2: No significant spinal canal or neural foraminal narrowing. L2-L3: No significant spinal canal or neural foraminal narrowing. L3-L4: No significant spinal canal or neural foraminal narrowing. L4-L5: No significant spinal canal or neural foraminal narrowing L5-S1: No significant spinal canal or neural foraminal narrowing. IMPRESSION: Normal MRI of the thoracic and lumbar spine. Electronically Signed   By: Maurine Simmering M.D.   On: 07/08/2021 16:45    MR Lumbar Spine W Wo Contrast   Result Date: 07/08/2021 CLINICAL DATA:  Motor neuron disease EXAM: MRI THORACIC AND LUMBAR SPINE WITHOUT AND WITH CONTRAST TECHNIQUE: Multiplanar and multiecho pulse sequences of the thoracic and lumbar spine were obtained without and with intravenous contrast. CONTRAST:  50mL GADAVIST GADOBUTROL 1 MMOL/ML IV SOLN COMPARISON:  Chest CT 03/15/2019 FINDINGS: MRI THORACIC SPINE FINDINGS There is respiratory motion artifact present which mildly degrades image quality on axial sequences. Alignment:  Physiologic. Vertebrae:  No fracture, evidence of discitis, or bone lesion. Cord: No abnormal cord signal.  No abnormal enhancement. Paraspinal and other soft tissues: Negative Disc levels: Evaluation of the disc levels are demonstrates no large disc herniation, significant spinal canal or neural foraminal narrowing. MRI LUMBAR SPINE FINDINGS Segmentation:  Standard. Alignment:  Physiologic. Vertebrae: No fracture, evidence of discitis, or aggressive bone lesion. Conus medullaris and cauda equina: Conus extends to the L1 level. Conus and cauda equina appear normal. No abnormal enhancement. Paraspinal and other soft tissues: Tiny bilateral renal cysts.  Disc levels: T12-L1: No significant spinal canal or neural foraminal narrowing. L1-L2: No significant spinal canal or neural foraminal narrowing. L2-L3: No significant spinal canal or neural foraminal narrowing. L3-L4: No significant spinal canal or neural foraminal narrowing. L4-L5: No significant spinal canal or neural foraminal narrowing L5-S1: No significant spinal canal or neural foraminal narrowing. IMPRESSION: Normal MRI of the thoracic and lumbar spine. Electronically Signed   By: Maurine Simmering M.D.   On: 07/08/2021 16:45      Health Maintenance Due  Topic Date Due   COVID-19 Vaccine (4 - 2023-24 season) 04/04/2022    There are no preventive care reminders to display for this patient.  Lab Results  Component Value Date   TSH 0.932 07/01/2021   Lab Results  Component Value Date   WBC 9.1 07/03/2022   HGB 13.1 07/03/2022   HCT 39.5 07/03/2022   MCV 88 07/03/2022   PLT 406 07/03/2022   Lab Results  Component Value Date   NA 138 02/25/2022   K 4.2 02/25/2022   CO2 27 02/25/2022   GLUCOSE 99 02/25/2022   BUN 10 02/25/2022   CREATININE 0.81 02/25/2022   BILITOT 0.4 02/25/2022   ALKPHOS 81 02/25/2022   AST 12 02/25/2022   ALT 11 02/25/2022   PROT 6.6 02/25/2022   ALBUMIN 4.4 02/25/2022   CALCIUM 9.7 02/25/2022   ANIONGAP 8 07/08/2021   EGFR 110 01/07/2022   GFR 93.61 02/25/2022   Lab Results  Component Value Date   CHOL 175 01/24/2017   Lab Results  Component Value Date   HDL 43 01/24/2017   Lab Results  Component Value Date   LDLCALC 98 01/24/2017   Lab Results  Component Value Date   TRIG 171 (H) 01/24/2017   Lab Results  Component Value Date   CHOLHDL 4.1 01/24/2017   Lab Results  Component Value Date   HGBA1C  5.3 10/28/2021  MR Brain W and Wo Contrast   Result Date: 07/02/2021 CLINICAL DATA:  Demyelinating disease EXAM: MRI HEAD WITHOUT AND WITH CONTRAST TECHNIQUE: Multiplanar, multiecho pulse sequences of the brain and surrounding structures were  obtained without and with intravenous contrast. CONTRAST:  59mL GADAVIST GADOBUTROL 1 MMOL/ML IV SOLN COMPARISON:  None. FINDINGS: Brain: No acute infarct, mass effect or extra-axial collection. No acute or chronic hemorrhage. Normal white matter signal, parenchymal volume and CSF spaces. The midline structures are normal. There is no abnormal contrast enhancement. Vascular: Major flow voids are preserved. Skull and upper cervical spine: Normal calvarium and skull base. Visualized upper cervical spine and soft tissues are normal. Sinuses/Orbits:No paranasal sinus fluid levels or advanced mucosal thickening. No mastoid or middle ear effusion. Normal orbits. IMPRESSION: Normal brain MRI. Electronically Signed   By: Ulyses Jarred M.D.   On: 07/02/2021 03:31    MR CERVICAL SPINE W WO CONTRAST   Result Date: 07/02/2021 CLINICAL DATA:  Demyelinating disease EXAM: MRI CERVICAL SPINE WITHOUT AND WITH CONTRAST TECHNIQUE: Multiplanar and multiecho pulse sequences of the cervical spine, to include the craniocervical junction and cervicothoracic junction, were obtained without and with intravenous contrast. CONTRAST:  21mL GADAVIST GADOBUTROL 1 MMOL/ML IV SOLN COMPARISON:  None. FINDINGS: Alignment: Physiologic. Vertebrae: No fracture, evidence of discitis, or bone lesion. Cord: Normal signal and morphology. Posterior Fossa, vertebral arteries, paraspinal tissues: Negative. Disc levels: No spinal canal or neural foraminal stenosis. IMPRESSION: Normal MRI of the cervical spine. No evidence of demyelinating disease.     Assessment & Plan:   Problem List Items Addressed This Visit   None No orders of the defined types were placed in this encounter. 30 minutes spent extra time needed Follow-up: No follow-ups on file.    Asencion Noble, MD

## 2022-11-04 ENCOUNTER — Ambulatory Visit: Payer: Medicaid Other | Attending: Critical Care Medicine | Admitting: Critical Care Medicine

## 2022-11-04 ENCOUNTER — Encounter: Payer: Self-pay | Admitting: Critical Care Medicine

## 2022-11-04 VITALS — BP 126/82 | HR 85 | Ht 61.0 in | Wt 298.6 lb

## 2022-11-04 DIAGNOSIS — Z139 Encounter for screening, unspecified: Secondary | ICD-10-CM | POA: Diagnosis not present

## 2022-11-04 DIAGNOSIS — Z5181 Encounter for therapeutic drug level monitoring: Secondary | ICD-10-CM

## 2022-11-04 DIAGNOSIS — H60543 Acute eczematoid otitis externa, bilateral: Secondary | ICD-10-CM

## 2022-11-04 DIAGNOSIS — R413 Other amnesia: Secondary | ICD-10-CM | POA: Diagnosis not present

## 2022-11-04 DIAGNOSIS — G479 Sleep disorder, unspecified: Secondary | ICD-10-CM

## 2022-11-04 DIAGNOSIS — G9331 Postviral fatigue syndrome: Secondary | ICD-10-CM

## 2022-11-04 DIAGNOSIS — Z6841 Body Mass Index (BMI) 40.0 and over, adult: Secondary | ICD-10-CM

## 2022-11-04 DIAGNOSIS — R739 Hyperglycemia, unspecified: Secondary | ICD-10-CM | POA: Insufficient documentation

## 2022-11-04 DIAGNOSIS — Z975 Presence of (intrauterine) contraceptive device: Secondary | ICD-10-CM | POA: Insufficient documentation

## 2022-11-04 DIAGNOSIS — K2951 Unspecified chronic gastritis with bleeding: Secondary | ICD-10-CM

## 2022-11-04 DIAGNOSIS — F411 Generalized anxiety disorder: Secondary | ICD-10-CM

## 2022-11-04 MED ORDER — ESCITALOPRAM OXALATE 10 MG PO TABS: 15.0000 mg | ORAL_TABLET | Freq: Every day | ORAL | Status: DC

## 2022-11-04 MED ORDER — MONTELUKAST SODIUM 10 MG PO TABS: 10.0000 mg | ORAL_TABLET | Freq: Every day | ORAL | 2 refills | Status: DC

## 2022-11-04 MED ORDER — KETOCONAZOLE 2 % EX CREA: 1.0000 | TOPICAL_CREAM | Freq: Two times a day (BID) | CUTANEOUS | 0 refills | Status: DC

## 2022-11-04 MED ORDER — PANTOPRAZOLE SODIUM 40 MG PO TBEC: 40.0000 mg | DELAYED_RELEASE_TABLET | Freq: Every day | ORAL | 2 refills | Status: DC

## 2022-11-04 MED ORDER — KETOCONAZOLE 2 % EX CREA: 1.0000 | TOPICAL_CREAM | Freq: Every day | CUTANEOUS | 0 refills | Status: DC

## 2022-11-04 NOTE — Assessment & Plan Note (Signed)
Weight now up to 56 BMI.  Patient's been resistant to exercising states she gets too fatigued  Patient states she is following a plant-based diet but when queried she occasionally skips breakfast when she does eat she eats still cut oatmeal with sugar on top she eats white rice occasionally brown rice and beans for lunch Pasta at dinnertime with occasional salad.  Recommended more the lifestyle medicine approach more plant-based diet and walking building up gradually to 30 minutes 4-5 times a week  The following Lifestyle Medicine recommendations according to [facility]) were discussed and offered to patient who agrees to start the journey:  A. Whole Foods, Plant-based plate comprising of fruits and vegetables, plant-based proteins, whole-grain carbohydrates was discussed in detail with the patient.   A list for source of those nutrients were also provided to the patient.  Patient will use only water or unsweetened tea for hydration. B.  The need to stay away from risky substances including alcohol, smoking; obtaining 7 to 9 hours of restorative sleep, at least 150 minutes of moderate intensity exercise weekly, the importance of healthy social connections,  and stress reduction techniques were discussed.

## 2022-11-04 NOTE — Assessment & Plan Note (Signed)
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

## 2022-11-04 NOTE — Assessment & Plan Note (Signed)
At patient request will check lithium level

## 2022-11-04 NOTE — Assessment & Plan Note (Signed)
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

## 2022-11-04 NOTE — Assessment & Plan Note (Signed)
Patient is occasionally checked her blood sugar has remained elevated we will check hemoglobin A1c

## 2022-11-04 NOTE — Assessment & Plan Note (Signed)
No diagnosis of narcolepsy made by sleep medicine sleep study was normal patient is on Provigil requesting increase dose explained this is out of my scope perhaps second opinion neurology will have an opinion

## 2022-11-04 NOTE — Patient Instructions (Addendum)
  Labs today include lipid panel and lithium level metabolic panel vitamin D 123456 thyroid levels  Try Nizoral application to area behind right ear and 2 areas just inside the ears twice daily call if this does not improve in 2 weeks  Second opinion from neurologist [name] [name] at [facility] for chronic pain, memory loss, chronic fatigue second opinion  Return to Dr. Joya Gaskins 2 months  We discussed eating more vegetables and also do not add sugar to oatmeal and also trying to build walking plan up to 30 minutes 3-4 times a week

## 2022-11-04 NOTE — Assessment & Plan Note (Signed)
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  Our neurologist cannot offer any specific diagnosis.  Will refer to [facility] memory clinic with Dr. Rondel Oh who is double boarded in psychiatry and neurology

## 2022-11-04 NOTE — Assessment & Plan Note (Signed)
Management per psychiatry 

## 2022-11-04 NOTE — Assessment & Plan Note (Signed)
Tolerating IUD Mirena well

## 2022-11-05 ENCOUNTER — Other Ambulatory Visit: Payer: Self-pay

## 2022-11-05 ENCOUNTER — Encounter: Payer: Self-pay | Admitting: Critical Care Medicine

## 2022-11-05 DIAGNOSIS — E7800 Pure hypercholesterolemia, unspecified: Secondary | ICD-10-CM | POA: Insufficient documentation

## 2022-11-05 DIAGNOSIS — E559 Vitamin D deficiency, unspecified: Secondary | ICD-10-CM | POA: Insufficient documentation

## 2022-11-05 LAB — LIPID PANEL
Chol/HDL Ratio: 4.9 ratio — ABNORMAL HIGH (ref 0.0–4.4)
Cholesterol, Total: 196 mg/dL (ref 100–199)
HDL: 40 mg/dL (ref >39)
LDL Chol Calc (NIH): 110 mg/dL — ABNORMAL HIGH (ref 0–99)
Triglycerides: 266 mg/dL — ABNORMAL HIGH (ref 0–149)
VLDL Cholesterol Cal: 46 mg/dL — ABNORMAL HIGH (ref 5–40)

## 2022-11-05 LAB — COMPREHENSIVE METABOLIC PANEL
ALT: 12 IU/L (ref 0–32)
AST: 11 IU/L (ref 0–40)
Albumin/Globulin Ratio: 2.1 (ref 1.2–2.2)
Albumin: 4.2 g/dL (ref 3.9–4.9)
Alkaline Phosphatase: 94 IU/L (ref 44–121)
BUN/Creatinine Ratio: 10 (ref 9–23)
BUN: 8 mg/dL (ref 6–20)
Bilirubin Total: 0.3 mg/dL (ref 0.0–1.2)
CO2: 23 mmol/L (ref 20–29)
Calcium: 8.9 mg/dL (ref 8.7–10.2)
Chloride: 105 mmol/L (ref 96–106)
Creatinine, Ser: 0.83 mg/dL (ref 0.57–1.00)
Globulin, Total: 2 g/dL (ref 1.5–4.5)
Glucose: 100 mg/dL — ABNORMAL HIGH (ref 70–99)
Potassium: 4.2 mmol/L (ref 3.5–5.2)
Sodium: 139 mmol/L (ref 134–144)
Total Protein: 6.2 g/dL (ref 6.0–8.5)
eGFR: 94 mL/min/{1.73_m2} (ref >59)

## 2022-11-05 LAB — VITAMIN D 25 HYDROXY (VIT D DEFICIENCY, FRACTURES): Vit D, 25-Hydroxy: 27.7 ng/mL — ABNORMAL LOW (ref 30.0–100.0)

## 2022-11-05 LAB — THYROID PANEL WITH TSH
Free Thyroxine Index: 2.1 (ref 1.2–4.9)
T3 Uptake Ratio: 27 % (ref 24–39)
T4, Total: 7.7 ug/dL (ref 4.5–12.0)
TSH: 1.77 u[IU]/mL (ref 0.450–4.500)

## 2022-11-05 LAB — HEMOGLOBIN A1C
Est. average glucose Bld gHb Est-mCnc: 111 mg/dL
Hgb A1c MFr Bld: 5.5 % (ref 4.8–5.6)

## 2022-11-05 LAB — LITHIUM LEVEL: Lithium Lvl: 0.2 mmol/L — ABNORMAL LOW (ref 0.5–1.2)

## 2022-11-05 LAB — VITAMIN B12: Vitamin B-12: 649 pg/mL (ref 232–1245)

## 2022-11-05 NOTE — Progress Notes (Signed)
Let patient know liver and kidneys normal cholesterol is elevated as are triglycerides, she should follow a low-fat diet for now medications not yet indicated her vitamin D is slightly low can use over-the-counter supplements suggest to vitamin D 2000 unit capsules daily, B12 levels are normal, the lithium level is slightly under therapy at 0.2 normal is 0.5 2.8 she should contact her psychiatrist for follow-up her thyroid is normal and she does not have diabetes or prediabetes

## 2022-11-10 ENCOUNTER — Telehealth: Payer: Self-pay

## 2022-11-10 NOTE — Telephone Encounter (Signed)
-----   Message from Storm Frisk, MD sent at 11/05/2022  8:21 AM EDT ----- Let patient know liver and kidneys normal cholesterol is elevated as are triglycerides, she should follow a low-fat diet for now medications not yet indicated her vitamin D is slightly low can use over-the-counter supplements suggest to vitamin D 2000 unit capsules daily, B12 levels are normal, the lithium level is slightly under therapy at 0.2 normal is 0.5 2.8 she should contact her psychiatrist for follow-up her thyroid is normal and she does not have diabetes or prediabetes

## 2022-11-10 NOTE — Telephone Encounter (Signed)
Pt was called and vm was left, Information has been sent to nurse pool.   

## 2022-11-18 ENCOUNTER — Other Ambulatory Visit: Payer: Self-pay

## 2022-12-10 NOTE — Progress Notes (Shared)
[name] [name] is a 37 y.o. female here for a new patient visit.  History of Present Illness:   No chief complaint on file.   HPI  ADHD, Anxiety and Depression, Bipolar Disorder Treated with aripirazole 5 mg daily in the morning, lithium carbonate 600 mg at bedtime, lamotrigine 200 mg at bedtime, escitalopram 15 mg daily.  Excessive Daytime Sleepiness Treated with modafinil 200 mg in the morning.   Asthma Treated with montelukast 10 mg at bedtime.  GERD, IBS Treated with pantoprazole 40 mg daily.  Restless Leg Syndrome Treated with pramipexole 0.75 mg at bedtime.  Headaches  Obesity   Past Medical History:  Diagnosis Date   ADHD (attention deficit hyperactivity disorder)    Anxiety    Asthma    Brain fog 06/12/2021   Chronic diarrhea 01/26/2022   Depression    Phreesia 06/30/2020   Epigastric pain 01/07/2022   GERD (gastroesophageal reflux disease)    Headache    History of kidney stones    IBS (irritable bowel syndrome)    Lower extremity weakness 07/08/2021   Obesity    Pneumonia    Pneumonitis 03/02/2019   Sinusitis, acute 01/01/2021   Weakness 07/08/2021     Social History   Tobacco Use   Smoking status: Never   Smokeless tobacco: Never  Vaping Use   Vaping Use: Never used  Substance Use Topics   Alcohol use: Never    Alcohol/week: 1.0 standard drink of alcohol    Types: 1 Glasses of wine per week    Comment: per week   Drug use: No    Past Surgical History:  Procedure Laterality Date   BIOPSY  04/24/2022   Procedure: BIOPSY;  Surgeon: Imogene Burn, MD;  Location: [facility];  Service: Gastroenterology;;   COLONOSCOPY  04/24/2022   Procedure: COLONOSCOPY;  Surgeon: Imogene Burn, MD;  Location: [facility];  Service: Gastroenterology;;   CYSTOSCOPY WITH RETROGRADE PYELOGRAM, URETEROSCOPY AND STENT PLACEMENT Right 12/10/2016   Procedure: CYSTOSCOPY WITH RETROGRADE PYELOGRAM, URETEROSCOPY AND STONE REMOVAL;  Surgeon: Heloise Purpura, MD;   Location: [facility];  Service: Urology;  Laterality: Right;   ESOPHAGOGASTRODUODENOSCOPY (EGD) WITH PROPOFOL N/A 04/24/2022   Procedure: ESOPHAGOGASTRODUODENOSCOPY (EGD) WITH PROPOFOL;  Surgeon: Imogene Burn, MD;  Location: [facility];  Service: Gastroenterology;  Laterality: N/A;   WISDOM TOOTH EXTRACTION  at age 3    Family History  Problem Relation Age of Onset   Depression Mother    Anxiety disorder Mother    Stomach cancer Neg Hx    Colon cancer Neg Hx    Esophageal cancer Neg Hx     Allergies  Allergen Reactions   Wellbutrin [Bupropion] Hives   Other Swelling    Bananas, kiwi Swelling in mouth and throat    Current Medications:   Current Outpatient Medications:    ARIPiprazole (ABILIFY) 5 MG tablet, Take 1 tablet (5 mg total) by mouth every morning., Disp: 30 tablet, Rfl: 1   escitalopram (LEXAPRO) 10 MG tablet, Take 1.5 tablets (15 mg total) by mouth daily., Disp: , Rfl:    ketoconazole (NIZORAL) 2 % cream, Apply 1 Application topically 2 (two) times daily. To area behind right ear and in externa ears twice daily, Disp: 30 g, Rfl: 0   lamoTRIgine (LAMICTAL) 200 MG tablet, Take 1 tablet (200 mg total) by mouth at bedtime. (if out of lamictal more than 1 wk do not restart, call MD), Disp: 90 tablet, Rfl: 0   lithium carbonate (LITHOBID) 300  MG ER tablet, Take 2 tablets (600 mg total) by mouth at bedtime., Disp: 180 tablet, Rfl: 0   modafinil (PROVIGIL) 200 MG tablet, Take 200 mg by mouth every morning., Disp: , Rfl:    montelukast (SINGULAIR) 10 MG tablet, Take 1 tablet (10 mg total) by mouth at bedtime., Disp: 90 tablet, Rfl: 2   pantoprazole (PROTONIX) 40 MG tablet, Take 1 tablet (40 mg total) by mouth daily., Disp: 90 tablet, Rfl: 2   pramipexole (MIRAPEX) 0.75 MG tablet, Take 1 tablet (0.75 mg total) by mouth at bedtime., Disp: 90 tablet, Rfl: 0   Review of Systems:   ROS  Vitals:   There were no vitals filed for this visit.   There is no height or weight on file  to calculate BMI.  Physical Exam:   Physical Exam  Assessment and Plan:   ***   I,Alexander Ruley,acting as a scribe for Jarold Motto, PA.,have documented all relevant documentation on the behalf of Jarold Motto, PA,as directed by  Jarold Motto, PA while in the presence of Jarold Motto, [place].   ***   Jarold Motto, PA-C

## 2022-12-11 ENCOUNTER — Other Ambulatory Visit: Payer: Self-pay

## 2022-12-11 MED ORDER — ARIPIPRAZOLE 5 MG PO TABS
5.0000 mg | ORAL_TABLET | Freq: Every day | ORAL | 1 refills | Status: AC
  Filled 2022-12-11 – 2022-12-22 (×2): qty 30, 30d supply, fill #0

## 2022-12-11 MED ORDER — ESCITALOPRAM OXALATE 10 MG PO TABS
10.0000 mg | ORAL_TABLET | Freq: Every day | ORAL | 1 refills | Status: AC
  Filled 2022-12-11 – 2022-12-22 (×2): qty 30, 30d supply, fill #0

## 2022-12-12 ENCOUNTER — Other Ambulatory Visit: Payer: Self-pay

## 2022-12-16 ENCOUNTER — Other Ambulatory Visit: Payer: Self-pay

## 2022-12-17 ENCOUNTER — Ambulatory Visit: Payer: Medicaid Other | Admitting: Physician Assistant

## 2022-12-18 ENCOUNTER — Other Ambulatory Visit: Payer: Self-pay

## 2022-12-22 ENCOUNTER — Other Ambulatory Visit (HOSPITAL_COMMUNITY): Payer: Self-pay

## 2022-12-22 ENCOUNTER — Other Ambulatory Visit: Payer: Self-pay

## 2022-12-23 ENCOUNTER — Other Ambulatory Visit (HOSPITAL_COMMUNITY): Payer: Self-pay

## 2022-12-23 MED ORDER — LAMOTRIGINE 200 MG PO TABS
200.0000 mg | ORAL_TABLET | Freq: Every day | ORAL | 1 refills | Status: AC
  Filled 2022-12-23: qty 30, 30d supply, fill #0

## 2022-12-23 MED ORDER — LITHIUM CARBONATE 300 MG PO CAPS
900.0000 mg | ORAL_CAPSULE | Freq: Every evening | ORAL | 1 refills | Status: AC
  Filled 2022-12-23: qty 90, 30d supply, fill #0

## 2022-12-24 ENCOUNTER — Other Ambulatory Visit (HOSPITAL_COMMUNITY): Payer: Self-pay

## 2022-12-26 ENCOUNTER — Encounter: Payer: Self-pay | Admitting: Obstetrics and Gynecology

## 2022-12-26 ENCOUNTER — Encounter: Payer: Self-pay | Admitting: Critical Care Medicine

## 2023-01-01 ENCOUNTER — Other Ambulatory Visit (HOSPITAL_COMMUNITY): Payer: Self-pay

## 2023-01-14 ENCOUNTER — Telehealth: Payer: Self-pay

## 2023-01-14 DIAGNOSIS — R413 Other amnesia: Secondary | ICD-10-CM

## 2023-01-14 DIAGNOSIS — U099 Post covid-19 condition, unspecified: Secondary | ICD-10-CM

## 2023-01-14 NOTE — Telephone Encounter (Signed)
Copied from CRM [id number]. Topic: Referral - Request for Referral >> Jan 14, 2023  4:19 PM Franchot Heidelberg wrote: Has patient seen PCP for this complaint? Yes.   *If NO, is insurance requiring patient see PCP for this issue before PCP can refer them? Referral for which specialty: Neurology  Preferred provider/office: [facility] Rennie Plowman [phone] Reason for referral: referral missing, even though patient was told it was sent there she says. "Long covid"

## 2023-01-15 NOTE — Addendum Note (Signed)
Addended by: Storm Frisk on: 01/15/2023 05:44 AM   Modules accepted: Orders

## 2023-01-15 NOTE — Telephone Encounter (Signed)
Referral resent  

## 2023-01-16 NOTE — Telephone Encounter (Signed)
Called patient and left voicemail.

## 2023-01-26 ENCOUNTER — Telehealth: Payer: Self-pay | Admitting: Critical Care Medicine

## 2023-01-26 NOTE — Telephone Encounter (Signed)
Copied from CRM [id number]. Topic: Referral - Status >> Jan 26, 2023  1:00 PM Franchot Heidelberg wrote: Reason for CRM: Pt called to check status of neurology referral and wants to make sure this gets sent ot [facility]

## 2023-01-28 NOTE — Telephone Encounter (Signed)
Called patient and gave her the information to the referral

## 2023-04-01 ENCOUNTER — Ambulatory Visit: Payer: Medicaid Other | Attending: Nurse Practitioner | Admitting: Nurse Practitioner

## 2023-04-01 ENCOUNTER — Encounter: Payer: Self-pay | Admitting: Nurse Practitioner

## 2023-04-01 VITALS — BP 125/80 | HR 95 | Ht 61.0 in | Wt 301.0 lb

## 2023-04-01 DIAGNOSIS — Z23 Encounter for immunization: Secondary | ICD-10-CM | POA: Diagnosis not present

## 2023-04-01 DIAGNOSIS — H8113 Benign paroxysmal vertigo, bilateral: Secondary | ICD-10-CM | POA: Diagnosis not present

## 2023-04-01 DIAGNOSIS — J4540 Moderate persistent asthma, uncomplicated: Secondary | ICD-10-CM

## 2023-04-01 DIAGNOSIS — Z7689 Persons encountering health services in other specified circumstances: Secondary | ICD-10-CM | POA: Diagnosis not present

## 2023-04-01 DIAGNOSIS — Z5181 Encounter for therapeutic drug level monitoring: Secondary | ICD-10-CM

## 2023-04-01 DIAGNOSIS — U099 Post covid-19 condition, unspecified: Secondary | ICD-10-CM

## 2023-04-01 DIAGNOSIS — M25551 Pain in right hip: Secondary | ICD-10-CM | POA: Diagnosis not present

## 2023-04-01 DIAGNOSIS — L309 Dermatitis, unspecified: Secondary | ICD-10-CM

## 2023-04-01 DIAGNOSIS — M25552 Pain in left hip: Secondary | ICD-10-CM

## 2023-04-01 MED ORDER — BETAMETHASONE DIPROPIONATE 0.05 % EX CREA
TOPICAL_CREAM | Freq: Two times a day (BID) | CUTANEOUS | 1 refills | Status: DC
Start: 2023-04-01 — End: 2023-09-29

## 2023-04-01 MED ORDER — FLUTICASONE FUROATE-VILANTEROL 100-25 MCG/ACT IN AEPB
1.0000 | INHALATION_SPRAY | Freq: Every day | RESPIRATORY_TRACT | 11 refills | Status: DC
Start: 2023-04-01 — End: 2023-09-29

## 2023-04-01 NOTE — Progress Notes (Signed)
Assessment & Plan:  [name] was seen today for establish care.  Diagnoses and all orders for this visit:  Encounter to establish care  Need for influenza vaccination -     Flu vaccine trivalent PF, 6mos and older(Flulaval,Afluria,Fluarix,Fluzone)  Eczema, unspecified type -     Ambulatory referral to Dermatology -     betamethasone dipropionate 0.05 % cream; Apply topically 2 (two) times daily.  Moderate persistent asthma without complication -     fluticasone furoate-vilanterol (BREO ELLIPTA) 100-25 MCG/ACT AEPB; Inhale 1 puff into the lungs daily. -     Ambulatory referral to Pulmonology  Long COVID -     fluticasone furoate-vilanterol (BREO ELLIPTA) 100-25 MCG/ACT AEPB; Inhale 1 puff into the lungs daily. -     Ambulatory referral to Pulmonology  Benign paroxysmal positional vertigo due to bilateral vestibular disorder -     Ambulatory referral to Physical Therapy  Therapeutic drug monitoring -     Lithium level  Bilateral hip pain -     DG Hip Unilat W OR W/O Pelvis Min 4 Views Left; Future    Patient has been counseled on age-appropriate routine health concerns for screening and prevention. These are reviewed and up-to-date. Referrals have been placed accordingly. Immunizations are up-to-date or declined.    Subjective:   Chief Complaint  Patient presents with   Establish Care   HPI [name] [name] 37 y.o. female presents to office today to establish care.   She has a past medical history of ADHD, Anxiety, Asthma, Bipolar 2 disorder, Brain fog (06/12/2021), Chronic diarrhea (01/26/2022), Depression, Epigastric pain (01/07/2022), GERD, Headache, History of kidney stones, IBS (irritable bowel syndrome), Lower extremity weakness (07/08/2021), Obesity, Pneumonia, Pneumonitis (03/02/2019), Sinusitis, acute (01/01/2021), and Weakness (07/08/2021).    Atopic Dermatitis She is currently using triamcinolone which has not been effective. She has dry patchy scaly areas  on her ears and left elbow.    Endorses chronic diarrhea. Will need to follow up with GI for this. She recently had a colonoscopy (2023)  Chronic diarrhea and was seing GI   Joint Pain Endorses bilateral posterior hip and gluteal pain. Worse with sitting for prolonged periods of time. Aggravating factors: lying in bed.   BPV She endorses dizziness with position changes. Has seen cardiology for this and workup did not reveal a cardiac etiology.  Asthma She has a history of asthma and long covid respiratory symptoms. Was prescribed flovent in the past but felt this was not effective and made her asthma worse. She has also been prescribed BREO which she stopped taking as she started feeling better.  We will resume this today. If ineffective she will need PFT/Spirometry.   Bipolar disorder She is followed by Behavioral health. Needs lithium level drawn per psychiatry.      Review of Systems  Constitutional:  Negative for fever, malaise/fatigue and weight loss.  HENT: Negative.  Negative for nosebleeds.   Eyes: Negative.  Negative for blurred vision, double vision and photophobia.  Respiratory:  Positive for cough and shortness of breath.   Cardiovascular: Negative.  Negative for chest pain, palpitations and leg swelling.  Gastrointestinal: Negative.  Negative for heartburn, nausea and vomiting.  Musculoskeletal:  Positive for joint pain. Negative for myalgias.  Skin:  Positive for itching and rash.  Neurological:  Positive for dizziness. Negative for focal weakness, seizures and headaches.  Psychiatric/Behavioral: Negative.  Negative for suicidal ideas.     Past Medical History:  Diagnosis Date   ADHD (attention deficit  hyperactivity disorder)    Anxiety    Asthma    Bipolar 2 disorder (HCC)    Brain fog 06/12/2021   Chronic diarrhea 01/26/2022   Depression    Phreesia 06/30/2020   Epigastric pain 01/07/2022   GERD (gastroesophageal reflux disease)    Headache    History  of kidney stones    IBS (irritable bowel syndrome)    Lower extremity weakness 07/08/2021   Obesity    Pneumonia    Pneumonitis 03/02/2019   Sinusitis, acute 01/01/2021   Weakness 07/08/2021    Past Surgical History:  Procedure Laterality Date   BIOPSY  04/24/2022   Procedure: BIOPSY;  Surgeon: Imogene Burn, MD;  Location: [facility];  Service: Gastroenterology;;   COLONOSCOPY  04/24/2022   Procedure: COLONOSCOPY;  Surgeon: Imogene Burn, MD;  Location: [facility];  Service: Gastroenterology;;   CYSTOSCOPY WITH RETROGRADE PYELOGRAM, URETEROSCOPY AND STENT PLACEMENT Right 12/10/2016   Procedure: CYSTOSCOPY WITH RETROGRADE PYELOGRAM, URETEROSCOPY AND STONE REMOVAL;  Surgeon: Heloise Purpura, MD;  Location: [facility];  Service: Urology;  Laterality: Right;   ESOPHAGOGASTRODUODENOSCOPY (EGD) WITH PROPOFOL N/A 04/24/2022   Procedure: ESOPHAGOGASTRODUODENOSCOPY (EGD) WITH PROPOFOL;  Surgeon: Imogene Burn, MD;  Location: [facility];  Service: Gastroenterology;  Laterality: N/A;   WISDOM TOOTH EXTRACTION  at age 67    Family History  Problem Relation Age of Onset   Depression Mother    Anxiety disorder Mother    Stomach cancer Neg Hx    Colon cancer Neg Hx    Esophageal cancer Neg Hx     Social History Reviewed with no changes to be made today.   Outpatient Medications Prior to Visit  Medication Sig Dispense Refill   hydrOXYzine (ATARAX) 25 MG tablet Take 25 mg by mouth daily.     loratadine (CLARITIN REDITABS) 10 MG dissolvable tablet Take 10 mg by mouth daily.     Multiple Vitamin (MULTIVITAMIN) capsule Take 1 capsule by mouth daily.     ARIPiprazole (ABILIFY) 5 MG tablet Take 1 tablet (5 mg total) by mouth daily. 30 tablet 1   escitalopram (LEXAPRO) 10 MG tablet Take 1 tablet by mouth daily 30 tablet 1   lamoTRIgine (LAMICTAL) 200 MG tablet Take 1 tablet (200 mg total) by mouth daily. 30 tablet 1   lithium carbonate (LITHOBID) 300 MG ER tablet Take 2 tablets (600 mg total) by  mouth at bedtime. 180 tablet 0   lithium carbonate 300 MG capsule Take 3 capsules (900 mg total) by mouth at bedtime. 90 capsule 1   modafinil (PROVIGIL) 200 MG tablet Take 200 mg by mouth every morning.     montelukast (SINGULAIR) 10 MG tablet Take 1 tablet (10 mg total) by mouth at bedtime. 90 tablet 2   pantoprazole (PROTONIX) 40 MG tablet Take 1 tablet (40 mg total) by mouth daily. 90 tablet 2   pramipexole (MIRAPEX) 0.75 MG tablet Take 1 tablet (0.75 mg total) by mouth at bedtime. 90 tablet 0   ARIPiprazole (ABILIFY) 5 MG tablet Take 1 tablet (5 mg total) by mouth every morning. 30 tablet 1   escitalopram (LEXAPRO) 10 MG tablet Take 1.5 tablets (15 mg total) by mouth daily.     ketoconazole (NIZORAL) 2 % cream Apply 1 Application topically 2 (two) times daily. To area behind right ear and in externa ears twice daily 30 g 0   lamoTRIgine (LAMICTAL) 200 MG tablet Take 1 tablet (200 mg total) by mouth at bedtime. (if out of lamictal  more than 1 wk do not restart, call MD) 90 tablet 0   No facility-administered medications prior to visit.    Allergies  Allergen Reactions   Wellbutrin [Bupropion] Hives   Other Swelling    Bananas, kiwi Swelling in mouth and throat       Objective:    BP 125/80 (BP Location: Left Arm, Patient Position: Sitting, Cuff Size: Normal)   Pulse 95   Ht 5\' 1"  (1.549 m)   Wt (!) 301 lb (136.5 kg)   LMP 03/05/2023 (Approximate)   SpO2 100%   BMI 56.87 kg/m  Wt Readings from Last 3 Encounters:  04/01/23 (!) 301 lb (136.5 kg)  11/04/22 298 lb 9.6 oz (135.4 kg)  10/15/22 (!) 303 lb 3.2 oz (137.5 kg)    Physical Exam Vitals and nursing note reviewed.  Constitutional:      Appearance: She is well-developed. She is obese.  HENT:     Head: Normocephalic and atraumatic.  Cardiovascular:     Rate and Rhythm: Normal rate and regular rhythm.     Heart sounds: Normal heart sounds. No murmur heard.    No friction rub. No gallop.  Pulmonary:     Effort:  Pulmonary effort is normal. No tachypnea or respiratory distress.     Breath sounds: Normal breath sounds. No decreased breath sounds, wheezing, rhonchi or rales.  Chest:     Chest wall: No tenderness.  Abdominal:     General: Bowel sounds are normal.     Palpations: Abdomen is soft.  Musculoskeletal:        General: Normal range of motion.     Cervical back: Normal range of motion.  Skin:    General: Skin is warm and dry.  Neurological:     Mental Status: She is alert and oriented to person, place, and time.     Coordination: Coordination normal.  Psychiatric:        Behavior: Behavior normal. Behavior is cooperative.        Thought Content: Thought content normal.        Judgment: Judgment normal.          Patient has been counseled extensively about nutrition and exercise as well as the importance of adherence with medications and regular follow-up. The patient was given clear instructions to go to ER or return to medical center if symptoms don't improve, worsen or new problems develop. The patient verbalized understanding.   Follow-up: Return if symptoms worsen or fail to improve.   Claiborne Rigg, FNP-BC [facility] and [facility] [place], [place] [phone]   04/01/2023, 3:00 PM

## 2023-04-02 ENCOUNTER — Other Ambulatory Visit: Payer: Self-pay

## 2023-04-02 ENCOUNTER — Other Ambulatory Visit: Payer: Self-pay | Admitting: Pharmacist

## 2023-04-02 LAB — LITHIUM LEVEL: Lithium Lvl: 0.6 mmol/L (ref 0.5–1.2)

## 2023-04-02 MED ORDER — BETAMETHASONE VALERATE 0.1 % EX CREA
1.0000 | TOPICAL_CREAM | Freq: Two times a day (BID) | CUTANEOUS | 1 refills | Status: DC
  Filled 2023-04-02: qty 45, 30d supply, fill #0

## 2023-04-03 ENCOUNTER — Other Ambulatory Visit: Payer: Self-pay

## 2023-04-09 ENCOUNTER — Other Ambulatory Visit: Payer: Self-pay

## 2023-04-20 ENCOUNTER — Other Ambulatory Visit: Payer: Self-pay

## 2023-04-20 ENCOUNTER — Ambulatory Visit: Payer: Medicaid Other | Attending: Nurse Practitioner

## 2023-04-20 DIAGNOSIS — R2681 Unsteadiness on feet: Secondary | ICD-10-CM | POA: Insufficient documentation

## 2023-04-20 DIAGNOSIS — H8113 Benign paroxysmal vertigo, bilateral: Secondary | ICD-10-CM | POA: Diagnosis not present

## 2023-04-20 DIAGNOSIS — R42 Dizziness and giddiness: Secondary | ICD-10-CM | POA: Diagnosis present

## 2023-04-20 NOTE — Therapy (Signed)
OUTPATIENT PHYSICAL THERAPY VESTIBULAR EVALUATION     Patient Name: [name] [name] MRN: [id number] DOB:07-23-1986, 37 y.o., female Today's Date: 04/20/2023  END OF SESSION:  PT End of Session - 04/20/23 0807     Visit Number 1    Date for PT Re-Evaluation 06/15/23    Progress Note Due on Visit 10    PT Start Time 0803    PT Stop Time 0845    PT Time Calculation (min) 42 min    Activity Tolerance Patient tolerated treatment well    Behavior During Therapy [facility] for tasks assessed/performed             Past Medical History:  Diagnosis Date   ADHD (attention deficit hyperactivity disorder)    Anxiety    Asthma    Bipolar 2 disorder (HCC)    Brain fog 06/12/2021   Chronic diarrhea 01/26/2022   Depression    Phreesia 06/30/2020   Epigastric pain 01/07/2022   GERD (gastroesophageal reflux disease)    Headache    History of kidney stones    IBS (irritable bowel syndrome)    Lower extremity weakness 07/08/2021   Obesity    Pneumonia    Pneumonitis 03/02/2019   Sinusitis, acute 01/01/2021   Weakness 07/08/2021   Past Surgical History:  Procedure Laterality Date   BIOPSY  04/24/2022   Procedure: BIOPSY;  Surgeon: Imogene Burn, MD;  [facility];  Service: Gastroenterology;;   COLONOSCOPY  04/24/2022   Procedure: COLONOSCOPY;  Surgeon: Imogene Burn, MD;  Location: [facility];  Service: Gastroenterology;;   CYSTOSCOPY WITH RETROGRADE PYELOGRAM, URETEROSCOPY AND STENT PLACEMENT Right 12/10/2016   Procedure: CYSTOSCOPY WITH RETROGRADE PYELOGRAM, URETEROSCOPY AND STONE REMOVAL;  Surgeon: Heloise Purpura, MD;  [facility]: [facility];  Service: Urology;  Laterality: Right;   ESOPHAGOGASTRODUODENOSCOPY (EGD) WITH PROPOFOL N/A 04/24/2022   Procedure: ESOPHAGOGASTRODUODENOSCOPY (EGD) WITH PROPOFOL;  Surgeon: Imogene Burn, MD;  [facility];  Service: Gastroenterology;  Laterality: N/A;   WISDOM TOOTH EXTRACTION  at age 75   Patient Active Problem List    Diagnosis Date Noted   Hypercholesterolemia 11/05/2022   Vitamin D deficiency 11/05/2022   Memory loss 11/04/2022   Morbid obesity with BMI of 50.0-59.9, adult (HCC) 11/04/2022   IUD (intrauterine device) in place 11/04/2022   Generalized anxiety disorder 07/03/2022   Bilateral carpal tunnel syndrome 07/03/2022   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   Sleep disturbance 02/27/2022   Leg edema 02/27/2022   Hemorrhagic gastritis 01/26/2022   Therapeutic drug monitoring 10/28/2021   Other atopic dermatitis 04/16/2020   Food allergy 01/12/2020   Eczema of external ear, 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   Asthma, moderate persistent 03/02/2019   Other allergic rhinitis 03/02/2019   Mold suspected exposure 03/02/2019    PCP: Shan Levans, MD REFERRING PROVIDER: Bertram Denver, NP  REFERRING DIAG: vertigo/dizziness  THERAPY DIAG:  Unsteadiness on feet  Dizziness and giddiness  ONSET DATE: 2 years  Rationale for Evaluation and Treatment: Rehabilitation  SUBJECTIVE:   SUBJECTIVE STATEMENT: I've had various illnesses over the last 2 years, one episode in which I was hospitalized and couldn't walk, had to go to PT to regain ability to walk.  I have dizziness which occurs consistently when I bend down and straighten back up, also if I am up on a step stool Pt accompanied by: self  PERTINENT HISTORY: hospitalized more than once in last year with virus, pneumonia, since that time intermittent dizziness.  Referred to PT, after cleared by cardiology  PAIN:  Are you having pain? no  PRECAUTIONS: Fall  RED FLAGS: None   WEIGHT BEARING RESTRICTIONS: No  FALLS: Has patient fallen in last 6 months? No  LIVING ENVIRONMENT: Lives with: lives with their spouse Lives in: House/apartment Stairs: Yes: External: 3 steps; bilateral but cannot reach both Has following equipment at home: None  PLOF: Independent  PATIENT GOALS: prevent falling/passing out  OBJECTIVE:   DIAGNOSTIC FINDINGS: no  recent imaging,MRI  imaging of brain 2 yrs ago wnl  COGNITION: Overall cognitive status: Within functional limits for tasks assessed   SENSATION: WFL  POSTURE:  No Significant postural limitations  Cervical ROM:  all wnl  Active A/PROM (deg) eval  Flexion wfl  Extension wfl  Right lateral flexion wfl  Left lateral flexion wfl  Right rotation wfl  Left rotation wfl  (Blank rows = not tested)  STRENGTH: grossly wnl Ue's and LE's    GAIT:no significant deficits, normal arm swing, stride length, cadence  FUNCTIONAL TESTS:  Functional gait assessment: 30/30  PATIENT SURVEYS:  DHI 14  VESTIBULAR ASSESSMENT:  GENERAL OBSERVATION: pleasant female, no specific guarding or loss of balance, normal eye movement, normal head movement with conversation   SYMPTOM BEHAVIOR:  Subjective history: after illness 2 yrs ago persistent dizziness at times  Non-Vestibular symptoms: nausea/vomiting  Type of dizziness: Imbalance (Disequilibrium), Unsteady with head/body turns, Lightheadedness/Faint, and "Swimmyheaded"  Frequency: daily,   Duration: brief, a min or 2  Aggravating factors: Induced by position change: sit to stand and bending down, and Induced by motion: bending down to the ground and on step stool  Relieving factors: head stationary and slow movements  Progression of symptoms: worse  OCULOMOTOR EXAM:  Ocular Alignment: normal  Ocular ROM: No Limitations  Spontaneous Nystagmus: absent  Gaze-Induced Nystagmus: absent  Smooth Pursuits: intact  Saccades: intact  Convergence/Divergence: 5 cm    VESTIBULAR - OCULAR REFLEX:   Slow VOR: Normal  VOR Cancellation: Normal  Head-Impulse Test: HIT Right: negative HIT Left: negative  Dynamic Visual Acuity:  3 line difference    POSITIONAL TESTING: Right Dix-Hallpike: no nystagmus Left Dix-Hallpike: no nystagmus Right Roll Test: no nystagmus Left Roll Test: no nystagmus  MOTION SENSITIVITY:  Motion Sensitivity  Quotient Intensity: 0 = none, 1 = Lightheaded, 2 = Mild, 3 = Moderate, 4 = Severe, 5 = Vomiting  Intensity  1. Sitting to supine 0  2. Supine to L side 0  3. Supine to R side 0  4. Supine to sitting 2  5. L Hallpike-Dix 0  6. Up from L  0  7. R Hallpike-Dix 0  8. Up from R  0  9. Sitting, head tipped to L knee 0  10. Head up from L knee 2-3  11. Sitting, head tipped to R knee 0  12. Head up from R knee 2-3  13. Sitting head turns x5 nt  14.Sitting head nods x5 nt  15. In stance, 180 turn to L  0  16. In stance, 180 turn to R 0    OTHOSTATICS: Supine: 99/76 , sitting 112/89 Standing: 114/93  FUNCTIONAL GAIT: Functional gait assessment: 30   VESTIBULAR TREATMENT:  DATE: 04/20/23:  Eval,  Modified CTSIB, scored perfect score Inst in vertical head movements with centre target, per medibridge, provided with printed directions.  Also advised regarding cardiovascular conditioning, to follow AHA guidelines  Habituation:  Standing Vertical Head Movements: number of reps: 30, 2 sx day   PATIENT EDUCATION: Education details: evaluation results, POC Person educated: Patient Education method: Explanation and Demonstration Education comprehension: verbalized understanding, returned demonstration, and verbal cues required  HOME EXERCISE PROGRAM:  GOALS: Goals reviewed with patient? Yes  SHORT TERM GOALS: Target date: 04/21/23  Competently able to follow HEP  Baseline: initiated today Goal status: MET  ASSESSMENT:  CLINICAL IMPRESSION: Patient is a 38 y.o. female who was evaluated  today by skilled physical therapy for vertigo/ dizziness  her symptoms are consistent with bending forward from standing and then moving upright.  She went through BPPV testing, the functional gait index, and VOR testing.  She scored well on everything except had replication of her dizziness with bending  forward and standing upright as well as noted a 3 line difference on VOR testing, normal being a 2 line difference.  We also tested for hypotension which was negative today.  At this point we have determined that no further skilled physical therapy is indicated.  She was advised to consider additional focused cardiovascular training , following American heart association guidelines of 150 min/week.  Also we discussed hydration.  Provided with home program of vertical head movement progression for VOR.no further skilled PT recommended at this time..   OBJECTIVE IMPAIRMENTS: decreased activity tolerance and dizziness.   ACTIVITY LIMITATIONS: bending and reach over head  PARTICIPATION LIMITATIONS: cleaning, laundry, and yard work  PERSONAL FACTORS: Behavior pattern, Time since onset of injury/illness/exacerbation, and 1-2 comorbidities:  obesity, h/o post viral fatigue  are also affecting patient's functional outcome.   REHAB POTENTIAL: Good  CLINICAL DECISION MAKING: Evolving/moderate complexity  EVALUATION COMPLEXITY: Moderate   PLAN:  PT FREQUENCY: one time visit  PT DURATION: other: one visit  PLANNED INTERVENTIONS:   PLAN FOR NEXT SESSION: one time visit, no further skilled PT indicated at this time for this Dx.   Aralynn Brake L Corleone Biegler, PT, DPT, OCS 04/20/2023, 1:04 PM

## 2023-05-01 ENCOUNTER — Encounter (HOSPITAL_BASED_OUTPATIENT_CLINIC_OR_DEPARTMENT_OTHER): Payer: Self-pay

## 2023-06-17 ENCOUNTER — Ambulatory Visit: Payer: Medicaid Other | Admitting: Physician Assistant

## 2023-06-29 ENCOUNTER — Ambulatory Visit (HOSPITAL_COMMUNITY)
Admission: EM | Admit: 2023-06-29 | Discharge: 2023-06-29 | Disposition: A | Discharge status: Home / Self Care | Payer: Medicaid Other | Attending: Family Medicine | Admitting: Family Medicine

## 2023-06-29 ENCOUNTER — Encounter (HOSPITAL_COMMUNITY): Payer: Self-pay | Admitting: Emergency Medicine

## 2023-06-29 ENCOUNTER — Ambulatory Visit (INDEPENDENT_AMBULATORY_CARE_PROVIDER_SITE_OTHER): Payer: Medicaid Other

## 2023-06-29 DIAGNOSIS — J069 Acute upper respiratory infection, unspecified: Secondary | ICD-10-CM

## 2023-06-29 DIAGNOSIS — R410 Disorientation, unspecified: Secondary | ICD-10-CM | POA: Diagnosis not present

## 2023-06-29 DIAGNOSIS — J4541 Moderate persistent asthma with (acute) exacerbation: Secondary | ICD-10-CM | POA: Diagnosis not present

## 2023-06-29 LAB — BASIC METABOLIC PANEL
Anion gap: 8 (ref 5–15)
BUN: 7 mg/dL (ref 6–20)
CO2: 24 mmol/L (ref 22–32)
Calcium: 9.7 mg/dL (ref 8.9–10.3)
Chloride: 103 mmol/L (ref 98–111)
Creatinine, Ser: 0.94 mg/dL (ref 0.44–1.00)
GFR, Estimated: >60 mL/min (ref >60)
Glucose, Bld: 76 mg/dL (ref 70–99)
Potassium: 4.2 mmol/L (ref 3.5–5.1)
Sodium: 135 mmol/L (ref 135–145)

## 2023-06-29 LAB — CBC
HCT: 37.9 % (ref 36.0–46.0)
Hemoglobin: 12.3 g/dL (ref 12.0–15.0)
MCH: 28.3 pg (ref 26.0–34.0)
MCHC: 32.5 g/dL (ref 30.0–36.0)
MCV: 87.3 fL (ref 80.0–100.0)
Platelets: 331 10*3/uL (ref 150–400)
RBC: 4.34 MIL/uL (ref 3.87–5.11)
RDW: 14.3 % (ref 11.5–15.5)
WBC: 10.7 10*3/uL — ABNORMAL HIGH (ref 4.0–10.5)
nRBC: 0 % (ref 0.0–0.2)

## 2023-06-29 MED ORDER — PREDNISONE 20 MG PO TABS: 40.0000 mg | ORAL_TABLET | Freq: Every day | ORAL | 0 refills | Status: AC

## 2023-06-29 MED ORDER — BENZONATATE 100 MG PO CAPS: 100.0000 mg | ORAL_CAPSULE | Freq: Three times a day (TID) | ORAL | 0 refills | Status: DC | PRN

## 2023-06-29 NOTE — Discharge Instructions (Signed)
Your chest x-ray is clear  Take benzonatate 100 mg, 1 tab every 8 hours as needed for cough.  Take prednisone 20 mg--2 daily for 5 days

## 2023-06-29 NOTE — ED Provider Notes (Signed)
[facility]    CSN: [id number] Arrival date & time: 06/29/23  4098      History   Chief Complaint Chief Complaint  Patient presents with   Cough   Nasal Congestion   Generalized Body Aches    HPI [name] [name] is a 37 y.o. female.    Cough Here for burning feeling and cough.  The cough has been dry, but she has had some mucus in her chest and throat possibly.  No fever or chills.  No nasal congestion.  Symptoms have been going on for about 5 days.  She has had some nausea and vomiting and diarrhea, but she feels that that is due to some recent grief.  She is getting fluids in and has been able to eat some  She has felt off and a little possibly confused at times.  She states she knows where she is but just will zone out easily and have to think hard about where she is  Last menstrual cycle has been a while, as she has an IUD.  She is allergic to Wellbutrin.    She does have a history of asthma. Past Medical History:  Diagnosis Date   ADHD (attention deficit hyperactivity disorder)    Anxiety    Asthma    Bipolar 2 disorder (HCC)    Brain fog 06/12/2021   Chronic diarrhea 01/26/2022   Depression    Phreesia 06/30/2020   Epigastric pain 01/07/2022   GERD (gastroesophageal reflux disease)    Headache    History of kidney stones    IBS (irritable bowel syndrome)    Lower extremity weakness 07/08/2021   Obesity    Pneumonia    Pneumonitis 03/02/2019   Sinusitis, acute 01/01/2021   Weakness 07/08/2021    Patient Active Problem List   Diagnosis Date Noted   Hypercholesterolemia 11/05/2022   Vitamin D deficiency 11/05/2022   Memory loss 11/04/2022   Morbid obesity with BMI of 50.0-59.9, adult (HCC) 11/04/2022   IUD (intrauterine device) in place 11/04/2022   Generalized anxiety disorder 07/03/2022   Bilateral carpal tunnel syndrome 07/03/2022   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   Sleep disturbance 02/27/2022   Leg edema 02/27/2022    Hemorrhagic gastritis 01/26/2022   Therapeutic drug monitoring 10/28/2021   Other atopic dermatitis 04/16/2020   Food allergy 01/12/2020   Eczema of external ear, 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   Asthma, moderate persistent 03/02/2019   Other allergic rhinitis 03/02/2019   Mold suspected exposure 03/02/2019    Past Surgical History:  Procedure Laterality Date   BIOPSY  04/24/2022   Procedure: BIOPSY;  Surgeon: Imogene Burn, MD;  [facility];  Service: Gastroenterology;;   COLONOSCOPY  04/24/2022   Procedure: COLONOSCOPY;  Surgeon: Imogene Burn, MD;  [facility];  Service: Gastroenterology;;   CYSTOSCOPY WITH RETROGRADE PYELOGRAM, URETEROSCOPY AND STENT PLACEMENT Right 12/10/2016   Procedure: CYSTOSCOPY WITH RETROGRADE PYELOGRAM, URETEROSCOPY AND STONE REMOVAL;  Surgeon: Heloise Purpura, MD;  [facility];  Service: Urology;  Laterality: Right;   ESOPHAGOGASTRODUODENOSCOPY (EGD) WITH PROPOFOL N/A 04/24/2022   Procedure: ESOPHAGOGASTRODUODENOSCOPY (EGD) WITH PROPOFOL;  Surgeon: Imogene Burn, MD;  [facility];  Service: Gastroenterology;  Laterality: N/A;   WISDOM TOOTH EXTRACTION  at age 23    OB History     Gravida  1   Para  1   Term  1   Preterm      AB      Living  1  SAB      IAB      Ectopic      Multiple      Live Births  1            Home Medications    Prior to Admission medications   Medication Sig Start Date End Date Taking? Authorizing Provider  benzonatate (TESSALON) 100 MG capsule Take 1 capsule (100 mg total) by mouth 3 (three) times daily as needed for cough. 06/29/23  Yes [name], MD  predniSONE (DELTASONE) 20 MG tablet Take 2 tablets (40 mg total) by mouth daily with breakfast for 5 days. 06/29/23 07/04/23 Yes [name], MD  ARIPiprazole (ABILIFY) 5 MG tablet Take 1 tablet (5 mg total) by mouth daily. 12/11/22     betamethasone dipropionate 0.05 % cream Apply topically 2 (two) times daily.  04/01/23   Claiborne Rigg, NP  betamethasone valerate (VALISONE) 0.1 % cream Apply 1 Application topically 2 (two) times daily. 04/02/23   Storm Frisk, MD  escitalopram (LEXAPRO) 10 MG tablet Take 1 tablet by mouth daily 12/11/22     fluticasone furoate-vilanterol (BREO ELLIPTA) 100-25 MCG/ACT AEPB Inhale 1 puff into the lungs daily. 04/01/23   Claiborne Rigg, NP  hydrOXYzine (ATARAX) 25 MG tablet Take 25 mg by mouth daily. 01/09/23   [provider]  lamoTRIgine (LAMICTAL) 200 MG tablet Take 1 tablet (200 mg total) by mouth daily. 12/11/22     lithium carbonate 300 MG capsule Take 3 capsules (900 mg total) by mouth at bedtime. 12/11/22     loratadine (CLARITIN REDITABS) 10 MG dissolvable tablet Take 10 mg by mouth daily.    [provider]  modafinil (PROVIGIL) 200 MG tablet Take 200 mg by mouth every morning. 12/16/21   [provider]  montelukast (SINGULAIR) 10 MG tablet Take 1 tablet (10 mg total) by mouth at bedtime. 11/04/22 05/03/23  Storm Frisk, MD  Multiple Vitamin (MULTIVITAMIN) capsule Take 1 capsule by mouth daily.    [provider]  pantoprazole (PROTONIX) 40 MG tablet Take 1 tablet (40 mg total) by mouth daily. 11/04/22   Storm Frisk, MD  pramipexole (MIRAPEX) 0.75 MG tablet Take 1 tablet (0.75 mg total) by mouth at bedtime. 09/03/22       Family History Family History  Problem Relation Age of Onset   Depression Mother    Anxiety disorder Mother    Stomach cancer Neg Hx    Colon cancer Neg Hx    Esophageal cancer Neg Hx     Social History Social History   Tobacco Use   Smoking status: Never   Smokeless tobacco: Never  Vaping Use   Vaping status: Never Used  Substance Use Topics   Alcohol use: Never    Alcohol/week: 1.0 standard drink of alcohol    Types: 1 Glasses of wine per week    Comment: per week   Drug use: No     Allergies   Wellbutrin [bupropion] and Other   Review of Systems Review of Systems   Respiratory:  Positive for cough.      Physical Exam Triage Vital Signs ED Triage Vitals  Encounter Vitals Group     BP 06/29/23 0855 (!) 154/87     Systolic BP Percentile --      Diastolic BP Percentile --      Pulse Rate 06/29/23 0855 98     Resp 06/29/23 0855 18     Temp 06/29/23 0855  98.3 F (36.8 C)     Temp Source 06/29/23 0855 Oral     SpO2 06/29/23 0855 99 %     Weight --      Height --      Head Circumference --      Peak Flow --      Pain Score 06/29/23 0858 5     Pain Loc --      Pain Education --      Exclude from Growth Chart --    No data found.  Updated Vital Signs BP (!) 154/87 (BP Location: Right Arm)   Pulse 98   Temp 98.3 F (36.8 C) (Oral)   Resp 18   SpO2 99%   Visual Acuity Right Eye Distance:   Left Eye Distance:   Bilateral Distance:    Right Eye Near:   Left Eye Near:    Bilateral Near:     Physical Exam Vitals reviewed.  Constitutional:      General: She is not in acute distress.    Appearance: She is not toxic-appearing.  HENT:     Nose: Nose normal.     Mouth/Throat:     Mouth: Mucous membranes are moist.     Comments: No erythema, but there is some clear exudate Eyes:     Extraocular Movements: Extraocular movements intact.     Conjunctiva/sclera: Conjunctivae normal.     Pupils: Pupils are equal, round, and reactive to light.  Cardiovascular:     Rate and Rhythm: Normal rate and regular rhythm.     Heart sounds: No murmur heard. Pulmonary:     Effort: No respiratory distress.     Breath sounds: No stridor. No wheezing, rhonchi or rales.  Musculoskeletal:     Cervical back: Neck supple.  Lymphadenopathy:     Cervical: No cervical adenopathy.  Skin:    Capillary Refill: Capillary refill takes less than 2 seconds.     Coloration: Skin is not jaundiced or pale.  Neurological:     General: No focal deficit present.     Mental Status: She is alert and oriented to person, place, and time.  Psychiatric:         Behavior: Behavior normal.      UC Treatments / Results  Labs (all labs ordered are listed, but only abnormal results are displayed) Labs Reviewed  CBC  BASIC METABOLIC PANEL    EKG   Radiology DG Chest 2 View  Result Date: 06/29/2023 CLINICAL DATA:  dry cough and congestion and burning in chest x 4-5 days. No fever EXAM: CHEST - 2 VIEW COMPARISON:  06/30/2021. FINDINGS: Cardiac silhouette is unremarkable. No pneumothorax or pleural effusion. The lungs are clear. The visualized skeletal structures are unremarkable. IMPRESSION: No acute cardiopulmonary process. Electronically Signed   By: Layla Maw M.D.   On: 06/29/2023 09:48    Procedures Procedures (including critical care time)  Medications Ordered in UC Medications - No data to display  Initial Impression / Assessment and Plan / UC Course  I have reviewed the triage vital signs and the nursing notes.  Pertinent labs & imaging results that were available during my care of the patient were reviewed by me and considered in my medical decision making (see chart for details).     Chest x-ray is clear.  She states she has an in date albuterol inhaler at home.  Prednisone is sent in for asthma exacerbation  .  Because of the mental fogginess complaint, CBC and  BMP are drawn today and we will let her know if there is anything significantly abnormal  Tessalon Perles are sent in for cough Final Clinical Impressions(s) / UC Diagnoses   Final diagnoses:  Moderate persistent asthma with acute exacerbation  Viral upper respiratory tract infection  Confusion     Discharge Instructions      Your chest x-ray is clear  Take benzonatate 100 mg, 1 tab every 8 hours as needed for cough.  Take prednisone 20 mg--2 daily for 5 days       ED Prescriptions     Medication Sig Dispense Auth. Provider   predniSONE (DELTASONE) 20 MG tablet Take 2 tablets (40 mg total) by mouth daily with breakfast for 5 days. 10 tablet  [name], MD   benzonatate (TESSALON) 100 MG capsule Take 1 capsule (100 mg total) by mouth 3 (three) times daily as needed for cough. 21 capsule [name], MD      PDMP not reviewed this encounter.   [name], MD 06/29/23 1007

## 2023-06-29 NOTE — ED Triage Notes (Signed)
Pt c/o fatigue, body aches, congestion, confusion, and dry cough for 4 days.   Pt took Advil and guaifenesin around 7:30 today

## 2023-08-18 ENCOUNTER — Other Ambulatory Visit: Payer: Self-pay | Admitting: Critical Care Medicine

## 2023-09-03 ENCOUNTER — Other Ambulatory Visit: Payer: Self-pay | Admitting: Critical Care Medicine

## 2023-09-24 ENCOUNTER — Other Ambulatory Visit: Payer: Self-pay | Admitting: Family Medicine

## 2023-09-24 NOTE — Telephone Encounter (Signed)
Requested medication (s) are due for refill today - yes  Requested medication (s) are on the active medication list -yes  Future visit scheduled - no  Last refill- 09/03/23 #30 for both  Notes to clinic: Last Rx has notes- sent for review, passes visit protocol  Requested Prescriptions  Pending Prescriptions Disp Refills   pantoprazole (PROTONIX) 40 MG tablet [Pharmacy Med Name: PANTOPRAZOLE DR 40 MG TAB[*]] 30 tablet 0    Sig: TAKE ONE TABLET BY MOUTH ONE TIME DAILY     Gastroenterology: Proton Pump Inhibitors Passed - 09/24/2023 12:45 PM      Passed - Valid encounter within last 12 months    Recent Outpatient Visits           5 months ago Encounter to establish care   [facility] [place]. [facility] Claiborne Rigg, NP   10 months ago Postviral fatigue syndrome   [facility] [place] [facility] [place]. [facility] Storm Frisk, MD   11 months ago Pain of right forearm   [facility] Dept [facility] [place]. [facility] Marcine Matar, MD   1 year ago Postviral fatigue syndrome   [facility] [place] [facility] Dept [facility] [place]. [facility] Storm Frisk, MD   1 year ago Chronic diarrhea   [facility] [facility] - [facility] Dept Of [place]. [facility] Storm Frisk, MD               montelukast (SINGULAIR) 10 MG tablet [Pharmacy Med Name: MONTELUKAST 10 MG TAB[*]] 30 tablet 0    Sig: TAKE ONE TABLET BY MOUTH AT BEDTIME     Pulmonology:  Leukotriene Inhibitors Passed - 09/24/2023 12:45 PM      Passed - Valid encounter within last 12 months    Recent Outpatient Visits           5 months ago Encounter to establish care   [facility] [place]. [facility] Claiborne Rigg, NP   10 months ago Postviral fatigue syndrome   [facility] [place] [facility] [place]. [facility]  Storm Frisk, MD   11 months ago Pain of right forearm   [facility] Dept Of [place]. [facility] Marcine Matar, MD   1 year ago Postviral fatigue syndrome   [facility] [place]. [facility] Storm Frisk, MD   1 year ago Chronic diarrhea   [facility] - A Dept Of . [facility] Storm Frisk, MD                 Requested Prescriptions  Pending Prescriptions Disp Refills   pantoprazole (PROTONIX) 40 MG tablet [Pharmacy Med Name: PANTOPRAZOLE DR 40 MG TAB[*]] 30 tablet 0    Sig: TAKE ONE TABLET BY MOUTH ONE TIME DAILY     Gastroenterology: Proton Pump Inhibitors Passed - 09/24/2023 12:45 PM      Passed - Valid encounter within last 12 months    Recent Outpatient Visits           5 months ago Encounter to establish care   [facility] [place] [facility]  Dept Of [place]. [facility] Claiborne Rigg, NP   10 months ago Postviral fatigue syndrome   [facility] [place] - [facility] Dept Of [place]. [facility] Storm Frisk, MD   11 months ago Pain of right forearm   [facility] [place]. [facility] Marcine Matar, MD   1 year ago Postviral fatigue syndrome   [place] [facility] [place] [facility] Dept [facility] [place]. [facility] Storm Frisk, MD   1 year ago Chronic diarrhea   [facility] [facility] - [facility] Dept Of [place]. [facility] Storm Frisk, MD               montelukast (SINGULAIR) 10 MG tablet [Pharmacy Med Name: MONTELUKAST 10 MG TAB[*]] 30 tablet 0    Sig: TAKE ONE TABLET BY MOUTH AT BEDTIME     Pulmonology:  Leukotriene Inhibitors Passed - 09/24/2023 12:45 PM      Passed - Valid encounter within last 12 months    Recent Outpatient Visits           5 months ago Encounter to establish care   [facility]  [place] [facility] Dept Of [place]. [facility] Claiborne Rigg, NP   10 months ago Postviral fatigue syndrome   [place] [facility] [place] - [facility] Dept Of [place]. [facility] Storm Frisk, MD   11 months ago Pain of right forearm   [place] [facility] - [facility] Dept Of [place]. [facility] Marcine Matar, MD   1 year ago Postviral fatigue syndrome   [place] [facility] [place] - [facility] Dept Of [place]. [facility] Storm Frisk, MD   1 year ago Chronic diarrhea   [facility] [facility] - [facility] Dept Of [place]. [facility] Storm Frisk, MD

## 2023-09-27 ENCOUNTER — Other Ambulatory Visit: Payer: Self-pay | Admitting: Family Medicine

## 2023-09-29 ENCOUNTER — Ambulatory Visit: Payer: Medicaid Other | Admitting: Obstetrics and Gynecology

## 2023-09-29 ENCOUNTER — Encounter: Payer: Self-pay | Admitting: Obstetrics and Gynecology

## 2023-09-29 ENCOUNTER — Other Ambulatory Visit: Payer: Self-pay

## 2023-09-29 VITALS — BP 127/85 | HR 101 | Wt 310.4 lb

## 2023-09-29 DIAGNOSIS — Z01419 Encounter for gynecological examination (general) (routine) without abnormal findings: Secondary | ICD-10-CM | POA: Diagnosis not present

## 2023-09-29 DIAGNOSIS — Z113 Encounter for screening for infections with a predominantly sexual mode of transmission: Secondary | ICD-10-CM

## 2023-09-29 DIAGNOSIS — Z803 Family history of malignant neoplasm of breast: Secondary | ICD-10-CM | POA: Diagnosis not present

## 2023-09-29 NOTE — Progress Notes (Signed)
   ANNUAL EXAM Patient name: [name] [name] MRN [id number]  Date of birth: 1985-10-03 Chief Complaint:   Gynecologic Exam  History of Present Illness:   [name] [name] is a 38 y.o. G9P1001 female being seen today for a routine annual exam.   Current concerns: Some cramping after intercourse. Some cramping when she is sad. Has IUD for approximately one year now. Dog recently passed away which is affected her a lot. Seeing therapist and taking medication.   Current birth control: IUD  No LMP recorded. (Menstrual status: IUD).  Last Pap/Pap History:  08/2022: pap/HPV wnl  Review of Systems:   Pertinent items are noted in HPI Denies any headaches, blurred vision, fatigue, shortness of breath, chest pain, abdominal pain, abnormal vaginal discharge/itching/odor/irritation, problems with periods, bowel movements, urination, or intercourse unless otherwise stated above.  Pertinent History Reviewed:  Reviewed past medical,surgical, social and family history.  Reviewed problem list, medications and allergies. Physical Assessment:   Vitals:   09/29/23 0831 09/29/23 0930  BP: (!) 111/90 127/85  Pulse: 100 (!) 101  Weight: (!) 310 lb 6.4 oz (140.8 kg)   Body mass index is 58.65 kg/m.   Physical Examination:  General appearance - well appearing, and in no distress Mental status - alert, oriented to person, place, and time Psych:  She has a normal mood and affect Skin - warm and dry, normal color, no suspicious lesions noted Chest - effort normal Heart - normal rate  Breasts - breasts appear normal, no suspicious masses, no skin or nipple changes or axillary nodes Abdomen - soft, nontender, nondistended, no masses or organomegaly Pelvic -  VULVA: normal appearing vulva with no masses, tenderness or lesions  VAGINA: normal appearing vagina with normal color and discharge, no lesions  CERVIX: normal appearing cervix without discharge or lesions, no CMT UTERUS: uterus is felt to be  normal size, shape, consistency and nontender  ADNEXA: No adnexal masses or tenderness noted. Extremities:  No swelling or varicosities noted  Chaperone present for exam  No results found for this or any previous visit (from the past 24 hours).  Assessment & Plan:  [name] was seen today for gynecologic exam.  Diagnoses and all orders for this visit:  Screening examination for STD (sexually transmitted disease) -     RPR+HBsAg+HCVAb+...  Encounter for annual routine gynecological examination - Cervical cancer screening: Discussed guidelines. Pap with HPV wnl 08/2022.  - GC/CT: declines  - Birth Control: IUD - Breast Health: Encouraged self breast awareness/SBE. Teaching provided.  - F/U 12 months and prn  Family history of breast cancer Discussed genetic testing after reviewing family history. She knows limited information about her family as she is estranged. Recently started talking to her sister again who mentioned mom with breast cancer.  -     [facility] Guidelines (2+17)   Orders Placed This Encounter  Procedures   RPR+HBsAg+HCVAb+...   [facility] Guidelines (2+17)    Meds: No orders of the defined types were placed in this encounter.   Follow-up: Return in about 1 year (around 09/28/2024) for annual.  Milas Hock, MD 09/29/2023 9:31 AM

## 2023-09-30 LAB — RPR+HBSAG+HCVAB+...
HIV Screen 4th Generation wRfx: NONREACTIVE
Hep C Virus Ab: NONREACTIVE
Hepatitis B Surface Ag: NEGATIVE
RPR Ser Ql: NONREACTIVE

## 2023-10-15 ENCOUNTER — Other Ambulatory Visit: Payer: Self-pay | Admitting: Family Medicine

## 2023-10-15 LAB — EMPOWER GYN GUIDELINES (2+17): REPORT SUMMARY: NEGATIVE

## 2023-10-15 NOTE — Telephone Encounter (Signed)
 Requested Prescriptions  Pending Prescriptions Disp Refills   pantoprazole (PROTONIX) 40 MG tablet [Pharmacy Med Name: PANTOPRAZOLE DR 40 MG TAB[*]] 30 tablet 0    Sig: TAKE ONE TABLET BY MOUTH ONE TIME DAILY     Gastroenterology: Proton Pump Inhibitors Passed - 10/15/2023  2:19 PM      Passed - Valid encounter within last 12 months    Recent Outpatient Visits           6 months ago Encounter to establish care   [facility] [place] [facility] [place]. [facility] Claiborne Rigg, NP   11 months ago Postviral fatigue syndrome   [facility] [place] - [facility] Of [place]. [facility] Storm Frisk, MD   1 year ago Pain of right forearm   [facility] - [facility] Dept Of [place]. [facility] Marcine Matar, MD   1 year ago Postviral fatigue syndrome   [facility] [place] [facility] Dept Of [place]. [facility] Storm Frisk, MD   1 year ago Chronic diarrhea   [facility] [place] - [facility] Of [place]. [facility] Storm Frisk, MD

## 2023-10-16 ENCOUNTER — Encounter: Payer: Self-pay | Admitting: Obstetrics and Gynecology

## 2023-11-04 ENCOUNTER — Other Ambulatory Visit: Payer: Self-pay | Admitting: Family Medicine

## 2023-11-09 ENCOUNTER — Other Ambulatory Visit: Payer: Self-pay | Admitting: Critical Care Medicine

## 2023-11-09 NOTE — Telephone Encounter (Signed)
 Copied from CRM [id number]. Topic: Clinical - Medication Refill >> Nov 09, 2023  3:32 PM Elle L wrote: Most Recent Primary Care Visit:  Provider: Bertram Denver W  Department[facility]  Visit Type: OFFICE VISIT  Date: 04/01/2023  Medication: montelukast (SINGULAIR) 10 MG tablet AND pantoprazole (PROTONIX) 40 MG tablet  Has the patient contacted their pharmacy? Yes  Is this the correct pharmacy for this prescription? Yes This is the patient's preferred pharmacy:   [facility] [address] [place], [place] - [address] [place] [facility] [address] [place]. [place] [address] Phone: [phone] Fax: [phone]  Has the prescription been filled recently? No  Is the patient out of the medication? Yes  Has the patient been seen for an appointment in the last year OR does the patient have an upcoming appointment? Yes  Can we respond through MyChart? Yes  Agent: Please be advised that Rx refills may take up to 3 business days. We ask that you follow-up with your pharmacy.

## 2023-11-10 ENCOUNTER — Telehealth: Payer: Self-pay

## 2023-11-10 NOTE — Telephone Encounter (Signed)
 Copied from CRM [id number]. Topic: Appointments - Scheduling Inquiry for Clinic >> Nov 09, 2023  3:35 PM Elle L wrote: Reason for CRM: The patient is requesting an appointment for medication refills. However, Dr. Delford Field is booked until the beginning of December. Her call back number is [phone].

## 2023-11-10 NOTE — Telephone Encounter (Signed)
 Requested medication (s) are due for refill today: yes  Requested medication (s) are on the active medication list: yes  Last refill:  protonix 10/15/23 #30/0, montelukast 09/28/23 #30/0  Future visit scheduled: no  Notes to clinic:  Unable to refill per protocol, appointment needed. Already TE about needing appt today     Requested Prescriptions  Pending Prescriptions Disp Refills   pantoprazole (PROTONIX) 40 MG tablet 30 tablet 0    Sig: Take 1 tablet (40 mg total) by mouth daily.     Gastroenterology: Proton Pump Inhibitors Passed - 11/10/2023  2:58 PM      Passed - Valid encounter within last 12 months    Recent Outpatient Visits           7 months ago Encounter to establish care   [facility] Of [place]. [facility] Claiborne Rigg, NP   1 year ago Postviral fatigue syndrome   [facility] [place] - [facility] Of [place]. [facility] Storm Frisk, MD   1 year ago Pain of right forearm   [facility] - A Dept Of [place]. [facility] Marcine Matar, MD   1 year ago Postviral fatigue syndrome   [facility] [place] - A Dept Of [place]. [facility] Storm Frisk, MD   1 year ago Chronic diarrhea   [facility] - A Dept Of [place]. [facility] Storm Frisk, MD               montelukast (SINGULAIR) 10 MG tablet 30 tablet 0    Sig: Take 1 tablet (10 mg total) by mouth at bedtime.     Pulmonology:  Leukotriene Inhibitors Passed - 11/10/2023  2:58 PM      Passed - Valid encounter within last 12 months    Recent Outpatient Visits           7 months ago Encounter to establish care   [facility] - A Dept Of [place]. [facility] Claiborne Rigg, NP   1 year ago Postviral fatigue syndrome   [place] [place] - [facility] Dept Of [place]. [facility] Storm Frisk, MD   1  year ago Pain of right forearm   [facility] - A Dept Of [place]. [facility] Marcine Matar, MD   1 year ago Postviral fatigue syndrome   [facility] [place] - A Dept Of [place]. [facility] Storm Frisk, MD   1 year ago Chronic diarrhea   [facility] - A Dept Of . [facility] Storm Frisk, MD

## 2023-11-12 NOTE — Telephone Encounter (Signed)
 Noted.

## 2023-11-24 ENCOUNTER — Encounter: Payer: Self-pay | Admitting: Nurse Practitioner

## 2023-11-24 ENCOUNTER — Ambulatory Visit: Attending: Nurse Practitioner | Admitting: Nurse Practitioner

## 2023-11-24 VITALS — BP 137/85 | HR 101 | Resp 20 | Ht 61.0 in | Wt 305.0 lb

## 2023-11-24 DIAGNOSIS — Z5181 Encounter for therapeutic drug level monitoring: Secondary | ICD-10-CM | POA: Diagnosis not present

## 2023-11-24 DIAGNOSIS — E559 Vitamin D deficiency, unspecified: Secondary | ICD-10-CM | POA: Diagnosis not present

## 2023-11-24 DIAGNOSIS — L209 Atopic dermatitis, unspecified: Secondary | ICD-10-CM | POA: Diagnosis not present

## 2023-11-24 DIAGNOSIS — E783 Hyperchylomicronemia: Secondary | ICD-10-CM

## 2023-11-24 DIAGNOSIS — J4540 Moderate persistent asthma, uncomplicated: Secondary | ICD-10-CM

## 2023-11-24 DIAGNOSIS — Z6841 Body Mass Index (BMI) 40.0 and over, adult: Secondary | ICD-10-CM

## 2023-11-24 DIAGNOSIS — R7989 Other specified abnormal findings of blood chemistry: Secondary | ICD-10-CM

## 2023-11-24 MED ORDER — PANTOPRAZOLE SODIUM 40 MG PO TBEC: 40.0000 mg | DELAYED_RELEASE_TABLET | Freq: Every day | ORAL | 1 refills | Status: DC

## 2023-11-24 MED ORDER — TRIAMCINOLONE ACETONIDE 0.1 % EX CREA: 1.0000 | TOPICAL_CREAM | Freq: Two times a day (BID) | CUTANEOUS | 1 refills | Status: DC

## 2023-11-24 MED ORDER — MONTELUKAST SODIUM 10 MG PO TABS: 10.0000 mg | ORAL_TABLET | Freq: Every day | ORAL | 1 refills | Status: DC

## 2023-11-24 MED ORDER — MOMETASONE FUROATE 0.1 % EX CREA: TOPICAL_CREAM | Freq: Every day | CUTANEOUS | 3 refills | Status: DC

## 2023-11-24 NOTE — Progress Notes (Signed)
 Assessment & Plan:  [name] was seen today for referral and medication refill.  Diagnoses and all orders for this visit:  Atopic dermatitis of scalp -     mometasone  (ELOCON ) 0.1 % cream; Apply topically daily. -     Ambulatory referral to Dermatology  Morbid obesity with BMI of 50.0-59.9, adult (HCC) -     VITAMIN D  25 Hydroxy (Vit-D Deficiency, Fractures); Future Recommended: DASH/Mediterranean diet: eating more fruits and vegetables, decrease carbohydrates and highly processed foods.  Exercise 3-5 times per week for at least 30 minutes  Vitamin D  deficiency -     VITAMIN D  25 Hydroxy (Vit-D Deficiency, Fractures); Future  Therapeutic drug monitoring -     Lithium  level; Future  Mixed hyperglyceridemia -     Lipid Panel; Future INSTRUCTIONS: Work on a low fat, heart healthy diet and participate in regular aerobic exercise program by working out at least 150 minutes per week; 5 days a week-30 minutes per day. Avoid red meat/beef/steak,  fried foods. junk foods, sodas, sugary drinks, unhealthy snacking, alcohol and smoking.  Drink at least 80 oz of water per day and monitor your carbohydrate intake daily.    Abnormal CBC -     CBC with Differential; Future  Moderate persistent asthma without complication Symptoms well controlled.  -     montelukast  (SINGULAIR ) 10 MG tablet; Take 1 tablet (10 mg total) by mouth at bedtime.   GERD without esophagitis -     pantoprazole  (PROTONIX ) 40 MG tablet; Take 1 tablet (40 mg total) by mouth daily. INSTRUCTIONS: Avoid GERD Triggers: acidic, spicy or fried foods, caffeine, coffee, sodas,  alcohol and chocolate.      Patient has been counseled on age-appropriate routine health concerns for screening and prevention. These are reviewed and up-to-date. Referrals have been placed accordingly. Immunizations are up-to-date or declined.    Subjective:   Chief Complaint  Patient presents with   Referral    Derm   Medication Refill     [name] [name] 38 y.o. female presents to office today for medication refills and atopic dermatitis. Reports having a eczema flare with dry scaly patches located in scalp and behind her right ear. Patient was referred to Dermatology on 04/01/2023. States she started a new job and was not able to follow up on referral. Ambulatory referral to Dermatology for atopic dermatitis. Discussed non pharmacological treatment to help relieve itching such as petroleum based emollient cream, and avoid chemicals that irritate area. Patient uses dye bleach to color hair. States she does not want to use oils in her hair.  Treatments tried in the past include: triamcinolone  (ineffective), betamethasone  dipropionate 0.05 % cream.    Bipolar disorder She is followed by Behavioral health. Needs lithium  level drawn per psychiatry.  Medication Refill This is a chronic problem. The current episode started more than 1 year ago. The problem occurs constantly. The problem has been unchanged. Associated symptoms include a rash. Nothing aggravates the symptoms. Treatments tried: steroid creams. The treatment provided mild relief.    Review of Systems  Constitutional:  Negative for weight loss.  HENT: Negative.    Eyes: Negative.   Respiratory: Negative.    Cardiovascular: Negative.   Gastrointestinal: Negative.   Genitourinary: Negative.   Musculoskeletal: Negative.   Skin:  Positive for itching and rash.  Neurological: Negative.   Endo/Heme/Allergies: Negative.   Psychiatric/Behavioral: Negative.     Past Medical History:  Diagnosis Date   ADHD (attention deficit hyperactivity disorder)  Anxiety    Asthma    Bipolar 2 disorder (HCC)    Brain fog 06/12/2021   Chronic diarrhea 01/26/2022   Depression    Phreesia 06/30/2020   Epigastric pain 01/07/2022   GERD (gastroesophageal reflux disease)    Headache    History of kidney stones    IBS (irritable bowel syndrome)    Lower extremity weakness  07/08/2021   Obesity    Pneumonia    Pneumonitis 03/02/2019   Sinusitis, acute 01/01/2021   Weakness 07/08/2021    Past Surgical History:  Procedure Laterality Date   BIOPSY  04/24/2022   Procedure: BIOPSY;  Surgeon: Daina Drum, MD;  Location: [facility];  Service: Gastroenterology;;   COLONOSCOPY  04/24/2022   Procedure: COLONOSCOPY;  Surgeon: Daina Drum, MD;  Location: [facility];  Service: Gastroenterology;;   CYSTOSCOPY WITH RETROGRADE PYELOGRAM, URETEROSCOPY AND STENT PLACEMENT Right 12/10/2016   Procedure: CYSTOSCOPY WITH RETROGRADE PYELOGRAM, URETEROSCOPY AND STONE REMOVAL;  Surgeon: Florencio Hunting, MD;  Location: [facility];  Service: Urology;  Laterality: Right;   ESOPHAGOGASTRODUODENOSCOPY (EGD) WITH PROPOFOL  N/A 04/24/2022   Procedure: ESOPHAGOGASTRODUODENOSCOPY (EGD) WITH PROPOFOL ;  Surgeon: Daina Drum, MD;  Location: [facility];  Service: Gastroenterology;  Laterality: N/A;   WISDOM TOOTH EXTRACTION  at age 30    Family History  Problem Relation Age of Onset   Depression Mother    Anxiety disorder Mother    Breast cancer Mother 48   Breast cancer Paternal Grandmother 58   Stomach cancer Neg Hx    Colon cancer Neg Hx    Esophageal cancer Neg Hx     Social History Reviewed with no changes to be made today.   Outpatient Medications Prior to Visit  Medication Sig Dispense Refill   ADDERALL 15 MG tablet      ARIPiprazole  (ABILIFY ) 5 MG tablet Take 1 tablet (5 mg total) by mouth daily. 30 tablet 1   escitalopram  (LEXAPRO ) 10 MG tablet Take 1 tablet by mouth daily 30 tablet 1   lamoTRIgine  (LAMICTAL ) 200 MG tablet Take 1 tablet (200 mg total) by mouth daily. 30 tablet 1   lithium  carbonate 300 MG capsule Take 3 capsules (900 mg total) by mouth at bedtime. 90 capsule 1   loratadine  (CLARITIN  REDITABS) 10 MG dissolvable tablet Take 10 mg by mouth daily.     pramipexole  (MIRAPEX ) 0.75 MG tablet Take 1 tablet (0.75 mg total) by mouth at bedtime. 90 tablet 0    montelukast  (SINGULAIR ) 10 MG tablet TAKE ONE TABLET BY MOUTH AT BEDTIME 30 tablet 0   pantoprazole  (PROTONIX ) 40 MG tablet TAKE ONE TABLET BY MOUTH ONE TIME DAILY 30 tablet 0   No facility-administered medications prior to visit.    Allergies  Allergen Reactions   Wellbutrin [Bupropion] Hives   Other Swelling    Bananas, kiwi Swelling in mouth and throat       Objective:    BP 137/85 (BP Location: Right Arm, Patient Position: Sitting, Cuff Size: Large) Comment (BP Location): lower arm  Pulse (!) 101   Resp 20   Ht 5\' 1"  (1.549 m)   Wt (!) 305 lb (138.3 kg)   SpO2 100%   BMI 57.63 kg/m  Wt Readings from Last 3 Encounters:  11/24/23 (!) 305 lb (138.3 kg)  09/29/23 (!) 310 lb 6.4 oz (140.8 kg)  04/01/23 (!) 301 lb (136.5 kg)   BP Readings from Last 3 Encounters:  11/24/23 137/85  09/29/23 127/85  06/29/23 (!) 154/87  Physical Exam Vitals and nursing note reviewed.  Constitutional:      Appearance: Normal appearance. She is obese.  Cardiovascular:     Rate and Rhythm: Regular rhythm. Tachycardia present.     Heart sounds: Normal heart sounds.  Pulmonary:     Effort: Pulmonary effort is normal.     Breath sounds: Normal breath sounds.  Musculoskeletal:        General: Normal range of motion.     Cervical back: Normal range of motion.  Skin:    General: Skin is warm and dry.     Findings: Rash present. Rash is purpuric and scaling.       Neurological:     Mental Status: She is alert and oriented to person, place, and time.  Psychiatric:        Attention and Perception: Attention and perception normal.        Mood and Affect: Mood and affect normal.        Speech: Speech normal.        Behavior: Behavior normal. Behavior is cooperative.        Thought Content: Thought content normal.        Cognition and Memory: Cognition normal.         Patient has been counseled extensively about nutrition and exercise as well as the importance of adherence with  medications and regular follow-up. The patient was given clear instructions to go to ER or return to medical center if symptoms don't improve, worsen or new problems develop. The patient verbalized understanding.   Follow-up: Return if symptoms worsen or fail to improve.   Willene Holian E Mozel Burdett, BSN RN student FNP [facility] [place], [place] [phone]   11/24/2023, 5:06 PM

## 2023-11-25 ENCOUNTER — Encounter: Payer: Self-pay | Admitting: Nurse Practitioner

## 2023-11-25 NOTE — Progress Notes (Signed)
 I have seen and examined this patient with the advanced practice provider STUDENT and agree with the note below

## 2023-11-27 NOTE — Telephone Encounter (Signed)
 She is more than welcome to have the mychart rep help her access her medical records

## 2023-12-01 ENCOUNTER — Ambulatory Visit: Attending: Family Medicine

## 2023-12-01 DIAGNOSIS — Z5181 Encounter for therapeutic drug level monitoring: Secondary | ICD-10-CM

## 2023-12-01 DIAGNOSIS — R7989 Other specified abnormal findings of blood chemistry: Secondary | ICD-10-CM

## 2023-12-01 DIAGNOSIS — E783 Hyperchylomicronemia: Secondary | ICD-10-CM

## 2023-12-01 DIAGNOSIS — E559 Vitamin D deficiency, unspecified: Secondary | ICD-10-CM

## 2023-12-02 LAB — LITHIUM LEVEL: Lithium Lvl: 0.6 mmol/L (ref 0.5–1.2)

## 2023-12-02 LAB — LIPID PANEL
Chol/HDL Ratio: 4.6 ratio — ABNORMAL HIGH (ref 0.0–4.4)
Cholesterol, Total: 176 mg/dL (ref 100–199)
HDL: 38 mg/dL — ABNORMAL LOW (ref >39)
LDL Chol Calc (NIH): 97 mg/dL (ref 0–99)
Triglycerides: 244 mg/dL — ABNORMAL HIGH (ref 0–149)
VLDL Cholesterol Cal: 41 mg/dL — ABNORMAL HIGH (ref 5–40)

## 2023-12-02 LAB — CBC WITH DIFFERENTIAL/PLATELET
Basophils Absolute: 0 10*3/uL (ref 0.0–0.2)
Basos: 0 %
EOS (ABSOLUTE): 0.2 10*3/uL (ref 0.0–0.4)
Eos: 3 %
Hematocrit: 38.3 % (ref 34.0–46.6)
Hemoglobin: 12.3 g/dL (ref 11.1–15.9)
Immature Grans (Abs): 0 10*3/uL (ref 0.0–0.1)
Immature Granulocytes: 1 %
Lymphocytes Absolute: 1.8 10*3/uL (ref 0.7–3.1)
Lymphs: 21 %
MCH: 28.4 pg (ref 26.6–33.0)
MCHC: 32.1 g/dL (ref 31.5–35.7)
MCV: 89 fL (ref 79–97)
Monocytes Absolute: 0.6 10*3/uL (ref 0.1–0.9)
Monocytes: 7 %
Neutrophils Absolute: 6.1 10*3/uL (ref 1.4–7.0)
Neutrophils: 68 %
Platelets: 368 10*3/uL (ref 150–450)
RBC: 4.33 x10E6/uL (ref 3.77–5.28)
RDW: 13.3 % (ref 11.7–15.4)
WBC: 8.8 10*3/uL (ref 3.4–10.8)

## 2023-12-02 LAB — VITAMIN D 25 HYDROXY (VIT D DEFICIENCY, FRACTURES): Vit D, 25-Hydroxy: 33.8 ng/mL (ref 30.0–100.0)

## 2023-12-05 ENCOUNTER — Encounter: Payer: Self-pay | Admitting: Nurse Practitioner

## 2024-03-04 NOTE — Procedures (Signed)
 SABRA

## 2024-04-14 ENCOUNTER — Other Ambulatory Visit: Payer: Self-pay

## 2024-04-14 ENCOUNTER — Emergency Department (HOSPITAL_COMMUNITY)
Admission: EM | Admit: 2024-04-14 | Discharge: 2024-04-15 | Discharge status: Against Medical Advice | Attending: Emergency Medicine | Admitting: Emergency Medicine

## 2024-04-14 ENCOUNTER — Encounter (HOSPITAL_COMMUNITY): Payer: Self-pay | Admitting: *Deleted

## 2024-04-14 DIAGNOSIS — R0602 Shortness of breath: Secondary | ICD-10-CM | POA: Diagnosis present

## 2024-04-14 DIAGNOSIS — T7840XA Allergy, unspecified, initial encounter: Secondary | ICD-10-CM | POA: Diagnosis not present

## 2024-04-14 DIAGNOSIS — Z5321 Procedure and treatment not carried out due to patient leaving prior to being seen by health care provider: Secondary | ICD-10-CM | POA: Diagnosis not present

## 2024-04-14 NOTE — ED Notes (Signed)
 Pt left prior to receiving lab work.

## 2024-04-14 NOTE — ED Provider Triage Note (Signed)
 Emergency Medicine Provider Triage Evaluation Note  [name] [name] , a 38 y.o. female  was evaluated in triage.  Pt complains of allergic reaction.  Patient tells me that she believes she is developing allergic reaction to apples, she had an apple several weeks ago and her mouth felt tingly, she then ate Gummies today that were made with apple juice and reports feeling the same, reports swelling in my tongue and throat, as well as sensation in extremities like my blood is draining out.  She has never had an anaphylactic reaction to any foods, she has never had to use an EpiPen .  She took Benadryl  before arriving.  Review of Systems  Positive: As above Negative: As above  Physical Exam  BP (!) 163/95   Pulse (!) 106   Temp 98.4 F (36.9 C)   Resp (!) 22   SpO2 100%  Gen:   Awake, no distress   Resp:  Normal effort  MSK:   Moves extremities without difficulty  Other:  No swelling of lips/tongue/oropharynx.  No rashes.  Medical Decision Making  Medically screening exam initiated at 8:53 PM.  Appropriate orders placed.  [name] A Propp was informed that the remainder of the evaluation will be completed by another provider, this initial triage assessment does not replace that evaluation, and the importance of remaining in the ED until their evaluation is complete.  Patient symptoms are not consistent with anaphylaxis, there is no swelling of lips/tongue/oropharynx, no rashes, no nausea/vomiting.   Glendia Rocky SAILOR, [place] 04/14/24 2054

## 2024-04-14 NOTE — ED Triage Notes (Addendum)
 Pt states she is having an allergic reaction x 30 minutes from an apple. Complains of SOB, throat swelling, feels faint. Reports taking 25mg  of benadryl  PTA.

## 2024-04-14 NOTE — ED Triage Notes (Signed)
 Pt ate an apple a week ago and noticed tingling to her mouth. Ate an apple today and noticed increased tingling to mouth and tongue. Took Benadryl  [id number]. C/o feeling weakness, Dizziness,  lightheaded, coldness to fingers, mouth and throat tingling

## 2024-04-15 NOTE — ED Notes (Signed)
 Called pt twice for vitals check..no response..removed

## 2024-05-10 ENCOUNTER — Other Ambulatory Visit: Payer: Self-pay

## 2024-05-10 ENCOUNTER — Encounter: Payer: Self-pay | Admitting: Nurse Practitioner

## 2024-05-10 ENCOUNTER — Ambulatory Visit: Attending: Nurse Practitioner | Admitting: Nurse Practitioner

## 2024-05-10 VITALS — BP 133/83 | HR 100 | Resp 20 | Ht 61.0 in | Wt 308.2 lb

## 2024-05-10 DIAGNOSIS — Z23 Encounter for immunization: Secondary | ICD-10-CM | POA: Diagnosis not present

## 2024-05-10 DIAGNOSIS — Z5181 Encounter for therapeutic drug level monitoring: Secondary | ICD-10-CM

## 2024-05-10 DIAGNOSIS — J3489 Other specified disorders of nose and nasal sinuses: Secondary | ICD-10-CM

## 2024-05-10 DIAGNOSIS — T7840XA Allergy, unspecified, initial encounter: Secondary | ICD-10-CM

## 2024-05-10 MED ORDER — COVID-19 MRNA VAC-TRIS(PFIZER) 30 MCG/0.3ML IM SUSY
0.3000 mL | PREFILLED_SYRINGE | Freq: Once | INTRAMUSCULAR | 0 refills | Status: AC
  Filled 2024-05-10: qty 0.3, 1d supply, fill #0

## 2024-05-10 NOTE — Progress Notes (Signed)
 Assessment & Plan:  [name] was seen today for allergic reaction.  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 Examination indicated potential obstruction. - Refer to ENT for further evaluation. - Prescribe steroid nasal spray for daily use.   Need for influenza vaccination -     Flu vaccine trivalent PF, 6mos and older(Flulaval,Afluria,Fluarix,Fluzone)  Allergic reaction, initial encounter -     Ambulatory referral to Allergy  Suspected food-related allergic reactions Allergic reactions to various foods with symptoms suggesting possible oral allergy  syndrome or reaction to food components. - Refer to allergist for comprehensive allergy  testing and evaluation.   Therapeutic drug monitoring -     Drug Screen 12+Alcohol+CRT, Ur -     [id number] U15-Unbund+Crt  Immunization Due for flu vaccination. - Administer flu vaccine today.  General Health Maintenance Routine drug screening required for Adderall prescription management. - Perform urine drug screen today.  Patient has been counseled on age-appropriate routine health concerns for screening and prevention. These are reviewed and up-to-date. Referrals have been placed accordingly. Immunizations are up-to-date or declined.    Subjective:   Chief Complaint  Patient presents with   Allergic Reaction    Patient believes that an allergic reaction occurred due to contact with apple and milk.   History of Present Illness [name] [name] is a 38 year old female who presents for evaluation of nasal congestion and allergic reactions.  She is here for a routine drug screening required every six months to continue her Adderall prescription by her prescrxiber. The office where she usually gets this done is now closed and only offers online services, so she needs to provide her results  independently.  She has experienced nasal congestion for a few years, initially attributing it to her nose ring and pressure from her glasses sitting on her nose. Despite removing the nose ring, she continues to feel nasal fullness and difficulty breathing through her nose. She has tried Breathe Right strips, which make her feel like she is going to sneeze constantly, and has used over-the-counter nasal sprays like Flonase  with moderate relief. She takes Singulair  and Claritin  for allergies.  She describes a recent allergic reaction that led her to the ER, and mentioned that some of her symptoms sounded similar to oral allergy  syndrome based on her own research. She has had reactions to bananas, kiwis, and recently to milk, despite being vegan and rarely consuming it. She experienced a similar reaction to an apple, which she eats regularly. Symptoms include tingling lips, a sensation of spiciness down her throat and chest, weakness, and tingling throughout her body. Her husband noted her hands felt cold during these episodes. She has had allergy  testing, which only showed a reaction to carrots, which she tolerates well.   Review of Systems  Constitutional:  Negative for fever, malaise/fatigue and weight loss.  HENT:  Positive for congestion. Negative for nosebleeds.        SEE HPI  Eyes: Negative.  Negative for blurred vision, double vision and photophobia.  Respiratory: Negative.  Negative for cough and shortness of breath.   Cardiovascular: Negative.  Negative for chest pain, palpitations and leg swelling.  Gastrointestinal: Negative.  Negative for heartburn, nausea and vomiting.  Musculoskeletal: Negative.  Negative for myalgias.  Neurological: Negative.  Negative for dizziness, focal weakness, seizures and headaches.  Psychiatric/Behavioral: Negative.  Negative for suicidal ideas.  Past Medical History:  Diagnosis Date   ADHD (attention deficit hyperactivity disorder)    Anxiety     Asthma    Bipolar 2 disorder (HCC)    Brain fog 06/12/2021   Chronic diarrhea 01/26/2022   Depression    Phreesia 06/30/2020   Epigastric pain 01/07/2022   GERD (gastroesophageal reflux disease)    Headache    History of kidney stones    IBS (irritable bowel syndrome)    Lower extremity weakness 07/08/2021   Obesity    Pneumonia    Pneumonitis 03/02/2019   Sinusitis, acute 01/01/2021   Weakness 07/08/2021    Past Surgical History:  Procedure Laterality Date   BIOPSY  04/24/2022   Procedure: BIOPSY;  Surgeon: Federico Rosario BROCKS, MD;  Location: [facility];  Service: Gastroenterology;;   COLONOSCOPY  04/24/2022   Procedure: COLONOSCOPY;  Surgeon: Federico Rosario BROCKS, MD;  Location: [facility];  Service: Gastroenterology;;   CYSTOSCOPY WITH RETROGRADE PYELOGRAM, URETEROSCOPY AND STENT PLACEMENT Right 12/10/2016   Procedure: CYSTOSCOPY WITH RETROGRADE PYELOGRAM, URETEROSCOPY AND STONE REMOVAL;  Surgeon: Renda Glance, MD;  Location: [facility];  Service: Urology;  Laterality: Right;   ESOPHAGOGASTRODUODENOSCOPY (EGD) WITH PROPOFOL  N/A 04/24/2022   Procedure: ESOPHAGOGASTRODUODENOSCOPY (EGD) WITH PROPOFOL ;  Surgeon: Federico Rosario BROCKS, MD;  Location: [facility];  Service: Gastroenterology;  Laterality: N/A;   WISDOM TOOTH EXTRACTION  at age 42    Family History  Problem Relation Age of Onset   Depression Mother    Anxiety disorder Mother    Breast cancer Mother 66   Breast cancer Paternal Grandmother 52   Stomach cancer Neg Hx    Colon cancer Neg Hx    Esophageal cancer Neg Hx     Social History Reviewed with no changes to be made today.   Outpatient Medications Prior to Visit  Medication Sig Dispense Refill   ADDERALL 15 MG tablet      ARIPiprazole  (ABILIFY ) 5 MG tablet Take 1 tablet (5 mg total) by mouth daily. 30 tablet 1   escitalopram  (LEXAPRO ) 10 MG tablet Take 1 tablet by mouth daily 30 tablet 1   lamoTRIgine  (LAMICTAL ) 200 MG tablet Take 1 tablet (200 mg total) by mouth daily. 30  tablet 1   lithium  carbonate 300 MG capsule Take 3 capsules (900 mg total) by mouth at bedtime. 90 capsule 1   loratadine  (CLARITIN  REDITABS) 10 MG dissolvable tablet Take 10 mg by mouth daily.     mometasone  (ELOCON ) 0.1 % cream Apply topically daily. 45 g 3   montelukast  (SINGULAIR ) 10 MG tablet Take 1 tablet (10 mg total) by mouth at bedtime. 90 tablet 1   pantoprazole  (PROTONIX ) 40 MG tablet Take 1 tablet (40 mg total) by mouth daily. 90 tablet 1   rOPINIRole (REQUIP) 0.5 MG tablet Take 0.5 mg by mouth at bedtime.     pramipexole  (MIRAPEX ) 0.75 MG tablet Take 1 tablet (0.75 mg total) by mouth at bedtime. (Patient not taking: Reported on 05/10/2024) 90 tablet 0   No facility-administered medications prior to visit.    Allergies  Allergen Reactions   Wellbutrin [Bupropion] Hives   Apple    Banana    Kiwi Extract    Other Swelling    Bananas, kiwi Swelling in mouth and throat       Objective:    BP 133/83 (BP Location: Left Arm, Patient Position: Sitting, Cuff Size: Large)   Pulse 100   Resp 20   Ht 5' 1 (1.549 m)   [place] ROLLEN)  308 lb 3.2 oz (139.8 kg)   LMP  (LMP Unknown)   SpO2 98%   BMI 58.23 kg/m  Wt Readings from Last 3 Encounters:  05/10/24 (!) 308 lb 3.2 oz (139.8 kg)  11/24/23 (!) 305 lb (138.3 kg)  09/29/23 (!) 310 lb 6.4 oz (140.8 kg)    Physical Exam Vitals and nursing note reviewed.  Constitutional:      Appearance: She is well-developed.  HENT:     Head: Normocephalic and atraumatic.     Nose:     Right Turbinates: Enlarged and swollen.     Comments: Unable to visualize left nares due to nose breathing and narrow nasal passage Cardiovascular:     Rate and Rhythm: Normal rate and regular rhythm.     Heart sounds: Normal heart sounds. No murmur heard.    No friction rub. No gallop.  Pulmonary:     Effort: Pulmonary effort is normal. No tachypnea or respiratory distress.     Breath sounds: Normal breath sounds. No decreased breath sounds, wheezing,  rhonchi or rales.  Chest:     Chest wall: No tenderness.  Musculoskeletal:        General: Normal range of motion.     Cervical back: Normal range of motion.  Skin:    General: Skin is warm and dry.  Neurological:     Mental Status: She is alert and oriented to person, place, and time.     Coordination: Coordination normal.  Psychiatric:        Behavior: Behavior normal. Behavior is cooperative.        Thought Content: Thought content normal.        Judgment: Judgment normal.       ..    Patient has been counseled extensively about nutrition and exercise as well as the importance of adherence with medications and regular follow-up. The patient was given clear instructions to go to ER or return to medical center if symptoms don't improve, worsen or new problems develop. The patient verbalized understanding.   Follow-up: No follow-ups on file.   Haze LELON Servant, FNP-BC [facility] [place], [place] [phone]   05/10/2024, 11:29 AM

## 2024-05-10 NOTE — Progress Notes (Signed)
 Swollen nostrils causing s.o.b.

## 2024-05-11 LAB — 730728 U15-UNBUND+CRT
6-Acetylmorphine, Urine: NEGATIVE ng/mL
Amphetamine Screen, Urine: NEGATIVE ng/mL
BENZODIAZ UR QL: NEGATIVE ng/mL
Barbiturate screen, urine: NEGATIVE ng/mL
Buprenorphine, Urine: NEGATIVE ng/mL
Cannabinoid: NEGATIVE ng/mL
Cocaine (Metab.), Urine: NEGATIVE ng/mL
Creatinine, Urine: 32.1 mg/dL (ref 20.0–300.0)
Ethyl Glucuronide Screen, Ur: NEGATIVE ng/mL
Fentanyl, Urine: NEGATIVE ng/mL
Methadone Screen, Urine: NEGATIVE ng/mL
OPIATE SCREEN URINE: NEGATIVE ng/mL
Oxycodone+Oxymorphone Ur Ql Scn: NEGATIVE ng/mL
PCP, Urine: NEGATIVE ng/mL
Propoxyphene: NEGATIVE ng/mL
Tramadol: NEGATIVE ng/mL
pH, Urine: 7.6 (ref 4.5–8.9)

## 2024-05-21 ENCOUNTER — Encounter: Payer: Self-pay | Admitting: Nurse Practitioner

## 2024-05-23 ENCOUNTER — Other Ambulatory Visit: Payer: Self-pay

## 2024-05-23 DIAGNOSIS — K2951 Unspecified chronic gastritis with bleeding: Secondary | ICD-10-CM

## 2024-05-23 DIAGNOSIS — J3089 Other allergic rhinitis: Secondary | ICD-10-CM

## 2024-05-23 MED ORDER — MONTELUKAST SODIUM 10 MG PO TABS
10.0000 mg | ORAL_TABLET | Freq: Every day | ORAL | 1 refills | Status: DC
  Filled 2024-05-23: qty 90, 90d supply, fill #0

## 2024-05-23 MED ORDER — PANTOPRAZOLE SODIUM 40 MG PO TBEC
40.0000 mg | DELAYED_RELEASE_TABLET | Freq: Every day | ORAL | 1 refills | Status: DC
  Filled 2024-05-23: qty 90, 90d supply, fill #0

## 2024-05-24 ENCOUNTER — Other Ambulatory Visit: Payer: Self-pay | Admitting: Nurse Practitioner

## 2024-05-24 ENCOUNTER — Other Ambulatory Visit: Payer: Self-pay

## 2024-05-24 DIAGNOSIS — J3089 Other allergic rhinitis: Secondary | ICD-10-CM

## 2024-05-24 MED ORDER — IPRATROPIUM BROMIDE 0.03 % NA SOLN
2.0000 | Freq: Two times a day (BID) | NASAL | 12 refills | Status: DC
  Filled 2024-05-24: qty 30, 30d supply, fill #0

## 2024-06-01 ENCOUNTER — Ambulatory Visit (HOSPITAL_COMMUNITY)
Admission: EM | Admit: 2024-06-01 | Discharge: 2024-06-01 | Disposition: A | Discharge status: Home / Self Care | Attending: Emergency Medicine | Admitting: Emergency Medicine

## 2024-06-01 ENCOUNTER — Encounter (HOSPITAL_COMMUNITY): Payer: Self-pay

## 2024-06-01 DIAGNOSIS — H60392 Other infective otitis externa, left ear: Secondary | ICD-10-CM | POA: Diagnosis not present

## 2024-06-01 DIAGNOSIS — H66012 Acute suppurative otitis media with spontaneous rupture of ear drum, left ear: Secondary | ICD-10-CM | POA: Diagnosis not present

## 2024-06-01 MED ORDER — AMOXICILLIN-POT CLAVULANATE 875-125 MG PO TABS: 1.0000 | ORAL_TABLET | Freq: Two times a day (BID) | ORAL | 0 refills | Status: DC

## 2024-06-01 MED ORDER — NEOMYCIN-POLYMYXIN-HC 3.5-10000-1 OT SUSP: 3.0000 [drp] | Freq: Three times a day (TID) | OTIC | 0 refills | Status: DC

## 2024-06-01 NOTE — ED Triage Notes (Signed)
 Patient here today with c/o nasal congestion, dry cough, and bilat ear fullness X 1 week. Patient has tried taking an OTC decongestant with no relief. Patient also takes Singulair  and Claritin  for seasonal allergies. Patient also tried taking Benadryl  and Azelastine  with no relief. Patient states that she has Eczema in her ears.

## 2024-06-01 NOTE — Discharge Instructions (Signed)
 Start taking Augmentin  twice daily for 7 days for ear infection. Also apply 3 drops of Cortisporin 3 times daily over the next 5 days for additional infection coverage. You can continue to use azelastine  nasal spray to help with nasal congestion. Alternate between Tylenol  and ibuprofen  as needed for any pain. Follow-up with your primary care provider or return here as needed.

## 2024-06-01 NOTE — ED Provider Notes (Signed)
 [facility]    CSN: [id number] Arrival date & time: 06/01/24  0850      History   Chief Complaint Chief Complaint  Patient presents with   Nasal Congestion   Ear Fullness    HPI [name] [name] is a 38 y.o. female.   Patient presents with nasal congestion, dry cough, and bilateral ear fullness for about 1 week.  Patient states that her left ear has had significantly worsening fullness and discomfort over the last few days.  Patient denies fever, body aches, and chills.  Patient states that she has been taking Singulair  and Claritin  daily for seasonal allergies.  Patient states that she has also been taking Benadryl  and using azelastine  nasal spray with minimal relief.  The history is provided by the patient and medical records.  Ear Fullness    Past Medical History:  Diagnosis Date   ADHD (attention deficit hyperactivity disorder)    Anxiety    Asthma    Bipolar 2 disorder (HCC)    Brain fog 06/12/2021   Chronic diarrhea 01/26/2022   Depression    Phreesia 06/30/2020   Epigastric pain 01/07/2022   GERD (gastroesophageal reflux disease)    Headache    History of kidney stones    IBS (irritable bowel syndrome)    Lower extremity weakness 07/08/2021   Obesity    Pneumonia    Pneumonitis 03/02/2019   Sinusitis, acute 01/01/2021   Weakness 07/08/2021    Patient Active Problem List   Diagnosis Date Noted   Hypercholesterolemia 11/05/2022   Vitamin D  deficiency 11/05/2022   Memory loss 11/04/2022   Morbid obesity with BMI of 50.0-59.9, adult (HCC) 11/04/2022   IUD (intrauterine device) in place 11/04/2022   Generalized anxiety disorder 07/03/2022   Bilateral carpal tunnel syndrome 07/03/2022   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   Sleep disturbance 02/27/2022   Leg edema 02/27/2022   Hemorrhagic gastritis 01/26/2022   Therapeutic drug monitoring 10/28/2021   Other atopic dermatitis 04/16/2020   Food allergy  01/12/2020   Eczema of external  ear, 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   Asthma, moderate persistent 03/02/2019   Other allergic rhinitis 03/02/2019   Mold suspected exposure 03/02/2019    Past Surgical History:  Procedure Laterality Date   BIOPSY  04/24/2022   Procedure: BIOPSY;  Surgeon: Federico Rosario BROCKS, MD;  Location: [facility];  Service: Gastroenterology;;   COLONOSCOPY  04/24/2022   Procedure: COLONOSCOPY;  Surgeon: Federico Rosario BROCKS, MD;  Location: [facility];  Service: Gastroenterology;;   CYSTOSCOPY WITH RETROGRADE PYELOGRAM, URETEROSCOPY AND STENT PLACEMENT Right 12/10/2016   Procedure: CYSTOSCOPY WITH RETROGRADE PYELOGRAM, URETEROSCOPY AND STONE REMOVAL;  Surgeon: Renda Glance, MD;  Location: [facility];  Service: Urology;  Laterality: Right;   ESOPHAGOGASTRODUODENOSCOPY (EGD) WITH PROPOFOL  N/A 04/24/2022   Procedure: ESOPHAGOGASTRODUODENOSCOPY (EGD) WITH PROPOFOL ;  Surgeon: Federico Rosario BROCKS, MD;  Location: [facility];  Service: Gastroenterology;  Laterality: N/A;   WISDOM TOOTH EXTRACTION  at age 2    OB History     Gravida  1   Para  1   Term  1   Preterm      AB      Living  1      SAB      IAB      Ectopic      Multiple      Live Births  1            Home Medications    Prior to Admission medications   Medication  Sig Start Date End Date Taking? Authorizing Provider  amoxicillin -clavulanate (AUGMENTIN ) 875-125 MG tablet Take 1 tablet by mouth every 12 (twelve) hours. 06/01/24  Yes Donel Osowski A, NP  neomycin-polymyxin-hydrocortisone (CORTISPORIN) 3.5-10000-1 OTIC suspension Place 3 drops into the left ear 3 (three) times daily. 06/01/24  Yes Johnie Flaming A, NP  ADDERALL 15 MG tablet  09/24/22   [provider]  ARIPiprazole  (ABILIFY ) 5 MG tablet Take 1 tablet (5 mg total) by mouth daily. 12/11/22     escitalopram  (LEXAPRO ) 10 MG tablet Take 1 tablet by mouth daily 12/11/22     ipratropium (ATROVENT ) 0.03 % nasal spray Place 2 sprays into both nostrils every 12 (twelve) hours.  05/24/24   Fleming, Zelda W, NP  lamoTRIgine  (LAMICTAL ) 200 MG tablet Take 1 tablet (200 mg total) by mouth daily. 12/11/22     lithium  carbonate 300 MG capsule Take 3 capsules (900 mg total) by mouth at bedtime. 12/11/22     loratadine  (CLARITIN  REDITABS) 10 MG dissolvable tablet Take 10 mg by mouth daily.    [provider]  montelukast  (SINGULAIR ) 10 MG tablet Take 1 tablet (10 mg total) by mouth at bedtime. 05/23/24   Fleming, Zelda W, NP  pantoprazole  (PROTONIX ) 40 MG tablet Take 1 tablet (40 mg total) by mouth daily. 05/23/24   Fleming, Zelda W, NP  rOPINIRole (REQUIP) 0.5 MG tablet Take 0.5 mg by mouth at bedtime. 04/12/24   [provider]    Family History Family History  Problem Relation Age of Onset   Depression Mother    Anxiety disorder Mother    Breast cancer Mother 64   Breast cancer Paternal Grandmother 88   Stomach cancer Neg Hx    Colon cancer Neg Hx    Esophageal cancer Neg Hx     Social History Social History   Tobacco Use   Smoking status: Never   Smokeless tobacco: Never  Vaping Use   Vaping status: Never Used  Substance Use Topics   Alcohol use: Never    Alcohol/week: 1.0 standard drink of alcohol    Types: 1 Glasses of wine per week    Comment: per week   Drug use: No     Allergies   Wellbutrin [bupropion], Apple, Banana, Kiwi extract, and Other   Review of Systems Review of Systems  Per HPI  Physical Exam Triage Vital Signs ED Triage Vitals [06/01/24 0956]  Encounter Vitals Group     BP 130/85     Girls Systolic BP Percentile      Girls Diastolic BP Percentile      Boys Systolic BP Percentile      Boys Diastolic BP Percentile      Pulse Rate 97     Resp 16     Temp 98.6 F (37 C)     Temp Source Oral     SpO2 98 %     Weight      Height      Head Circumference      Peak Flow      Pain Score 0     Pain Loc      Pain Education      Exclude from Growth Chart    No data found.  Updated Vital Signs BP 130/85 (BP  Location: Left Arm)   Pulse 97   Temp 98.6 F (37 C) (Oral)   Resp 16   LMP  (LMP Unknown)   SpO2 98%   Visual Acuity Right Eye Distance:  Left Eye Distance:   Bilateral Distance:    Right Eye Near:   Left Eye Near:    Bilateral Near:     Physical Exam Vitals and nursing note reviewed.  Constitutional:      General: She is awake. She is not in acute distress.    Appearance: Normal appearance. She is well-developed and well-groomed. She is not ill-appearing.  HENT:     Right Ear: Tympanic membrane, ear canal and external ear normal.     Left Ear: Drainage and swelling present. Tympanic membrane is perforated.     Ears:     Comments: Purulent drainage and mild swelling noted to left ear canal with perforated TM noted.    Nose: Congestion and rhinorrhea present.     Mouth/Throat:     Mouth: Mucous membranes are moist.     Pharynx: Posterior oropharyngeal erythema present. No oropharyngeal exudate.  Cardiovascular:     Rate and Rhythm: Normal rate and regular rhythm.  Pulmonary:     Effort: Pulmonary effort is normal.     Breath sounds: Normal breath sounds.  Skin:    General: Skin is warm and dry.  Neurological:     Mental Status: She is alert.  Psychiatric:        Behavior: Behavior is cooperative.      UC Treatments / Results  Labs (all labs ordered are listed, but only abnormal results are displayed) Labs Reviewed - No data to display  EKG   Radiology No results found.  Procedures Procedures (including critical care time)  Medications Ordered in UC Medications - No data to display  Initial Impression / Assessment and Plan / UC Course  I have reviewed the triage vital signs and the nursing notes.  Pertinent labs & imaging results that were available during my care of the patient were reviewed by me and considered in my medical decision making (see chart for details).     Patient is overall well-appearing.  Vitals are stable.  Prescribed Augmentin   and Cortisporin eardrops for dual coverage of otitis media and externa.  Discussed follow-up and return precautions.  Final Clinical Impressions(s) / UC Diagnoses   Final diagnoses:  Non-recurrent acute suppurative otitis media of left ear with spontaneous rupture of tympanic membrane  Infective otitis externa of left ear     Discharge Instructions      Start taking Augmentin  twice daily for 7 days for ear infection. Also apply 3 drops of Cortisporin 3 times daily over the next 5 days for additional infection coverage. You can continue to use azelastine  nasal spray to help with nasal congestion. Alternate between Tylenol  and ibuprofen  as needed for any pain. Follow-up with your primary care provider or return here as needed.     ED Prescriptions     Medication Sig Dispense Auth. Provider   amoxicillin -clavulanate (AUGMENTIN ) 875-125 MG tablet Take 1 tablet by mouth every 12 (twelve) hours. 14 tablet Johnie, Kaina Orengo A, NP   neomycin-polymyxin-hydrocortisone (CORTISPORIN) 3.5-10000-1 OTIC suspension Place 3 drops into the left ear 3 (three) times daily. 10 mL Johnie Flaming A, NP      PDMP not reviewed this encounter.   Johnie Flaming A, NP 06/01/24 1032

## 2024-06-16 ENCOUNTER — Telehealth: Payer: Self-pay

## 2024-06-16 NOTE — Telephone Encounter (Signed)
 Copied from CRM [id number]. Topic: Clinical - Medication Question >> Jun 16, 2024  9:31 AM Antony RAMAN wrote: Reason for CRM: pts on antibiotics, is requesting a yeast infection pill

## 2024-06-17 ENCOUNTER — Telehealth: Admitting: Physician Assistant

## 2024-06-17 DIAGNOSIS — R109 Unspecified abdominal pain: Secondary | ICD-10-CM

## 2024-06-17 DIAGNOSIS — R52 Pain, unspecified: Secondary | ICD-10-CM

## 2024-06-17 DIAGNOSIS — R5383 Other fatigue: Secondary | ICD-10-CM

## 2024-06-17 DIAGNOSIS — N898 Other specified noninflammatory disorders of vagina: Secondary | ICD-10-CM

## 2024-06-17 NOTE — Progress Notes (Signed)
  Because you are having multiple symptoms including body aches, abdominal pain and fatigue with vaginal discharge, I feel your condition warrants further evaluation and I recommend that you be seen in a face-to-face visit.   NOTE: There will be NO CHARGE for this E-Visit   If you are having a true medical emergency, please call 911.     For an urgent face to face visit, [facility] has multiple urgent care centers for your convenience.  Click the link below for the full list of locations and hours, walk-in wait times, appointment scheduling options and driving directions:  Urgent Care - [place] [employer], [place], [place], [place], [place], [place]  [place]     Your MyChart E-visit questionnaire answers were reviewed by a board certified advanced clinical practitioner to complete your personal care plan based on your specific symptoms.    Thank you for using e-Visits.

## 2024-06-21 ENCOUNTER — Other Ambulatory Visit: Payer: Self-pay | Admitting: Nurse Practitioner

## 2024-06-21 DIAGNOSIS — B379 Candidiasis, unspecified: Secondary | ICD-10-CM

## 2024-06-21 MED ORDER — FLUCONAZOLE 150 MG PO TABS: 150.0000 mg | ORAL_TABLET | Freq: Once | ORAL | 0 refills | Status: AC

## 2024-06-21 NOTE — Telephone Encounter (Signed)
 Diflucan sent

## 2024-06-22 NOTE — Telephone Encounter (Signed)
 Unable to reach patient by phone.  Voicemail left with response from provider per DPR.

## 2024-08-16 ENCOUNTER — Ambulatory Visit (INDEPENDENT_AMBULATORY_CARE_PROVIDER_SITE_OTHER): Admitting: Otolaryngology

## 2024-08-16 ENCOUNTER — Encounter (INDEPENDENT_AMBULATORY_CARE_PROVIDER_SITE_OTHER): Payer: Self-pay | Admitting: Otolaryngology

## 2024-08-16 VITALS — BP 141/86 | HR 96 | Ht 64.0 in | Wt 310.0 lb

## 2024-08-16 DIAGNOSIS — H6993 Unspecified Eustachian tube disorder, bilateral: Secondary | ICD-10-CM

## 2024-08-16 DIAGNOSIS — J329 Chronic sinusitis, unspecified: Secondary | ICD-10-CM

## 2024-08-16 MED ORDER — AMOXICILLIN-POT CLAVULANATE 875-125 MG PO TABS: 1.0000 | ORAL_TABLET | Freq: Two times a day (BID) | ORAL | 0 refills | Status: AC

## 2024-08-16 MED ORDER — FLUCONAZOLE 150 MG PO TABS: 150.0000 mg | ORAL_TABLET | Freq: Once | ORAL | 0 refills | Status: AC

## 2024-08-16 NOTE — Progress Notes (Signed)
 Reason for Consult: Nasal obstruction Referring Physician: Dr. Fleming  [name] [name] is an 39 y.o. female.  HPI: She is here for couple issues.  When she had a left ear decreased hearing and some popping sensation.  She was seen by urgent care.  They told her she had a perforation and she was treated with antibiotics and drops.  She never had any pain.  She has resolved that and hearing is back to normal.  No further issues with the left ear.  The right ear has some popping and cracking occasionally.  This especially is true when she leans down and then gets back up.  She has no hearing loss.  No tinnitus.  She has a chronic nasal obstruction/congestion.  She has tried nasal steroid sprays and Atrovent .  She is also on antihistamines.  She has colored drainage intermittently.  She was on Augmentin  for her left ear infection but that is the only antibiotic in a long time.  She does not use saline irrigation.  No facial pressure or pain.  Past Medical History:  Diagnosis Date   ADHD (attention deficit hyperactivity disorder)    Anxiety    Asthma    Bipolar 2 disorder (HCC)    Brain fog 06/12/2021   Chronic diarrhea 01/26/2022   Depression    Phreesia 06/30/2020   Epigastric pain 01/07/2022   GERD (gastroesophageal reflux disease)    Headache    History of kidney stones    IBS (irritable bowel syndrome)    Lower extremity weakness 07/08/2021   Obesity    Pneumonia    Pneumonitis 03/02/2019   Sinusitis, acute 01/01/2021   Weakness 07/08/2021    Past Surgical History:  Procedure Laterality Date   BIOPSY  04/24/2022   Procedure: BIOPSY;  Surgeon: Federico Rosario BROCKS, MD;  Location: [facility];  Service: Gastroenterology;;   COLONOSCOPY  04/24/2022   Procedure: COLONOSCOPY;  Surgeon: Federico Rosario BROCKS, MD;  Location: [facility];  Service: Gastroenterology;;   CYSTOSCOPY WITH RETROGRADE PYELOGRAM, URETEROSCOPY AND STENT PLACEMENT Right 12/10/2016   Procedure: CYSTOSCOPY WITH RETROGRADE  PYELOGRAM, URETEROSCOPY AND STONE REMOVAL;  Surgeon: Renda Glance, MD;  Location: [facility];  Service: Urology;  Laterality: Right;   ESOPHAGOGASTRODUODENOSCOPY (EGD) WITH PROPOFOL  N/A 04/24/2022   Procedure: ESOPHAGOGASTRODUODENOSCOPY (EGD) WITH PROPOFOL ;  Surgeon: Federico Rosario BROCKS, MD;  Location: [facility];  Service: Gastroenterology;  Laterality: N/A;   WISDOM TOOTH EXTRACTION  at age 3    Family History  Problem Relation Age of Onset   Depression Mother    Anxiety disorder Mother    Breast cancer Mother 27   Breast cancer Paternal Grandmother 47   Stomach cancer Neg Hx    Colon cancer Neg Hx    Esophageal cancer Neg Hx     Social History:  reports that she has never smoked. She has never used smokeless tobacco. She reports that she does not drink alcohol and does not use drugs.  Allergies: Allergies[1]   No results found for this or any previous visit (from the past 48 hours).  No results found.  ROS There were no vitals taken for this visit. Physical Exam Constitutional:      Appearance: Normal appearance.  HENT:     Head: Normocephalic and atraumatic.     Right Ear: Tympanic membrane is without lesions and middle ear aerated, ear canal and external ear normal.     Left Ear: Tympanic membrane is without lesions and middle ear aerated, ear canal and external  ear normal.     Nose: Nose is have some purulent debris in both sides.  The mucosa is inflamed.  The septum is slightly deviated to the left and there is a little spur on each side inferiorly.  Turbinates with mild hypertrophy, No significant swelling or masses.     Oral cavity/oropharynx: Mucous membranes are moist. No lesions or masses    Larynx: normal voice. Mirror attempted without success    Eyes:     Extraocular Movements: Extraocular movements intact.     Conjunctiva/sclera: Conjunctivae normal.     Pupils: Pupils are equal, round, and reactive to light.  Cardiovascular:     Rate and Rhythm: Normal rate.   Pulmonary:     Effort: Pulmonary effort is normal.  Musculoskeletal:     Cervical back: Normal range of motion and neck supple. No rigidity.  Lymphadenopathy:     Cervical: No cervical adenopathy or masses.salivary glands without lesions. .     Salivary glands- no mass or swelling Neurological:     Mental Status: He is alert. CN 2-12 intact. No nystagmus      Assessment/Plan: Chronic sinusitis-I think that she has likely an inflammatory/infection issue that needs to be addressed.  I started her on Augmentin .  Will see how she does with that and if she is not better we will try clindamycin.  After that course of treatment and still symptomatic a CT scan will be ordered.  She also will get Diflucan  as she gets a yeast infection with every antibiotic course.  Eustachian tube dysfunction-right now it the presumption would be that is secondary to her nose and we will see how treatment of the nose affects the ear.  [name] 08/16/2024, 8:03 AM        [1]  Allergies Allergen Reactions   Wellbutrin [Bupropion] Hives   Apple    Banana    Kiwi Extract    Other Swelling    Bananas, kiwi Swelling in mouth and throat

## 2024-08-19 ENCOUNTER — Encounter: Payer: Self-pay | Admitting: Nurse Practitioner

## 2024-08-19 ENCOUNTER — Other Ambulatory Visit: Payer: Self-pay

## 2024-08-19 DIAGNOSIS — J3089 Other allergic rhinitis: Secondary | ICD-10-CM

## 2024-08-19 DIAGNOSIS — K2951 Unspecified chronic gastritis with bleeding: Secondary | ICD-10-CM

## 2024-08-19 MED ORDER — PANTOPRAZOLE SODIUM 40 MG PO TBEC: 40.0000 mg | DELAYED_RELEASE_TABLET | Freq: Every day | ORAL | 0 refills | Status: DC

## 2024-08-19 MED ORDER — MONTELUKAST SODIUM 10 MG PO TABS: 10.0000 mg | ORAL_TABLET | Freq: Every day | ORAL | 0 refills | Status: DC

## 2024-09-04 ENCOUNTER — Encounter (INDEPENDENT_AMBULATORY_CARE_PROVIDER_SITE_OTHER): Payer: Self-pay | Admitting: Otolaryngology

## 2024-09-06 ENCOUNTER — Encounter: Payer: Self-pay | Admitting: Nurse Practitioner

## 2024-09-06 ENCOUNTER — Ambulatory Visit: Payer: Self-pay | Admitting: Nurse Practitioner

## 2024-09-06 VITALS — BP 132/82 | HR 103 | Ht 64.0 in | Wt 309.6 lb

## 2024-09-06 DIAGNOSIS — J3089 Other allergic rhinitis: Secondary | ICD-10-CM

## 2024-09-06 DIAGNOSIS — K219 Gastro-esophageal reflux disease without esophagitis: Secondary | ICD-10-CM

## 2024-09-06 DIAGNOSIS — J40 Bronchitis, not specified as acute or chronic: Secondary | ICD-10-CM | POA: Diagnosis not present

## 2024-09-06 DIAGNOSIS — B379 Candidiasis, unspecified: Secondary | ICD-10-CM

## 2024-09-06 DIAGNOSIS — J4540 Moderate persistent asthma, uncomplicated: Secondary | ICD-10-CM

## 2024-09-06 DIAGNOSIS — T3695XA Adverse effect of unspecified systemic antibiotic, initial encounter: Secondary | ICD-10-CM

## 2024-09-06 MED ORDER — PANTOPRAZOLE SODIUM 40 MG PO TBEC: 40.0000 mg | DELAYED_RELEASE_TABLET | Freq: Every day | ORAL | 1 refills | Status: AC

## 2024-09-06 MED ORDER — TRELEGY ELLIPTA 100-62.5-25 MCG/ACT IN AEPB: 1.0000 | INHALATION_SPRAY | Freq: Every day | RESPIRATORY_TRACT | 11 refills | Status: AC

## 2024-09-06 MED ORDER — AZITHROMYCIN 250 MG PO TABS: ORAL_TABLET | ORAL | 0 refills | Status: AC

## 2024-09-06 MED ORDER — MONTELUKAST SODIUM 10 MG PO TABS: 10.0000 mg | ORAL_TABLET | Freq: Every day | ORAL | 1 refills | Status: AC

## 2024-09-06 MED ORDER — ALBUTEROL SULFATE HFA 108 (90 BASE) MCG/ACT IN AERS: 2.0000 | INHALATION_SPRAY | Freq: Four times a day (QID) | RESPIRATORY_TRACT | 0 refills | Status: AC | PRN

## 2024-09-06 MED ORDER — FLUCONAZOLE 150 MG PO TABS: 150.0000 mg | ORAL_TABLET | ORAL | 0 refills | Status: AC | PRN

## 2024-09-06 NOTE — Patient Instructions (Addendum)
 [facility]  [address]  [place], [place] [address] PH# [phone]   [facility] Address: [address], [place], [place] [address] Phone: [phone]

## 2024-09-07 ENCOUNTER — Ambulatory Visit: Admitting: Nurse Practitioner

## 2024-09-08 ENCOUNTER — Other Ambulatory Visit: Payer: Self-pay

## 2024-09-09 ENCOUNTER — Other Ambulatory Visit: Payer: Self-pay

## 2024-09-09 ENCOUNTER — Telehealth: Payer: Self-pay

## 2024-09-09 NOTE — Telephone Encounter (Signed)
 Pharmacy Patient Advocate Encounter  Received notification from [facility] MEDICAID that Prior Authorization for [facility] has been APPROVED from 09/08/2024 to 09/08/2025   PA #/Case ID/Reference #: [id number]

## 2024-09-27 ENCOUNTER — Ambulatory Visit: Payer: Self-pay | Admitting: Obstetrics and Gynecology
